# Patient Record
Sex: Female | Born: 1955
Health system: Southern US, Community
[De-identification: ages and names within clinical notes are randomized; demographics above are authoritative.]

## PROBLEM LIST (undated history)

## (undated) ENCOUNTER — Emergency Department (HOSPITAL_COMMUNITY): Admission: EM | Payer: Medicare Other | Source: Home / Self Care

## (undated) DIAGNOSIS — G801 Spastic diplegic cerebral palsy: Secondary | ICD-10-CM

## (undated) DIAGNOSIS — M879 Osteonecrosis, unspecified: Secondary | ICD-10-CM

## (undated) DIAGNOSIS — F329 Major depressive disorder, single episode, unspecified: Secondary | ICD-10-CM

## (undated) DIAGNOSIS — L03116 Cellulitis of left lower limb: Secondary | ICD-10-CM

## (undated) DIAGNOSIS — N289 Disorder of kidney and ureter, unspecified: Secondary | ICD-10-CM

## (undated) DIAGNOSIS — I872 Venous insufficiency (chronic) (peripheral): Secondary | ICD-10-CM

## (undated) DIAGNOSIS — E785 Hyperlipidemia, unspecified: Secondary | ICD-10-CM

## (undated) DIAGNOSIS — F32A Depression, unspecified: Secondary | ICD-10-CM

## (undated) DIAGNOSIS — IMO0002 Reserved for concepts with insufficient information to code with codable children: Secondary | ICD-10-CM

## (undated) DIAGNOSIS — I10 Essential (primary) hypertension: Secondary | ICD-10-CM

## (undated) HISTORY — DX: Venous insufficiency (chronic) (peripheral): I87.2

## (undated) HISTORY — DX: Cellulitis of left lower limb: L03.116

## (undated) HISTORY — DX: Depression, unspecified: F32.A

## (undated) HISTORY — DX: Major depressive disorder, single episode, unspecified: F32.9

## (undated) HISTORY — PX: JOINT REPLACEMENT: SHX530

## (undated) HISTORY — DX: Essential (primary) hypertension: I10

## (undated) HISTORY — DX: Spastic diplegic cerebral palsy: G80.1

## (undated) HISTORY — DX: Osteonecrosis, unspecified: M87.9

## (undated) HISTORY — DX: Reserved for concepts with insufficient information to code with codable children: IMO0002

## (undated) HISTORY — DX: Hyperlipidemia, unspecified: E78.5

---

## 1998-01-22 ENCOUNTER — Encounter: Admission: RE | Admit: 1998-01-22 | Discharge: 1998-01-22 | Payer: Self-pay | Admitting: Hematology and Oncology

## 1998-03-05 ENCOUNTER — Encounter: Admission: RE | Admit: 1998-03-05 | Discharge: 1998-03-05 | Payer: Self-pay | Admitting: Internal Medicine

## 1998-03-22 ENCOUNTER — Encounter: Admission: RE | Admit: 1998-03-22 | Discharge: 1998-03-22 | Payer: Self-pay | Admitting: Internal Medicine

## 1998-04-06 ENCOUNTER — Ambulatory Visit: Admission: RE | Admit: 1998-04-06 | Discharge: 1998-04-06 | Payer: Self-pay | Admitting: *Deleted

## 1998-09-20 ENCOUNTER — Encounter: Admission: RE | Admit: 1998-09-20 | Discharge: 1998-09-20 | Payer: Self-pay | Admitting: Internal Medicine

## 1998-12-20 ENCOUNTER — Encounter: Admission: RE | Admit: 1998-12-20 | Discharge: 1998-12-20 | Payer: Self-pay | Admitting: Internal Medicine

## 1999-04-21 ENCOUNTER — Emergency Department (HOSPITAL_COMMUNITY): Admission: EM | Admit: 1999-04-21 | Discharge: 1999-04-21 | Payer: Self-pay | Admitting: Emergency Medicine

## 1999-06-06 ENCOUNTER — Encounter: Admission: RE | Admit: 1999-06-06 | Discharge: 1999-06-06 | Payer: Self-pay | Admitting: Hematology and Oncology

## 1999-11-27 ENCOUNTER — Encounter: Payer: Self-pay | Admitting: Internal Medicine

## 1999-11-27 ENCOUNTER — Encounter: Admission: RE | Admit: 1999-11-27 | Discharge: 1999-11-27 | Payer: Self-pay | Admitting: Internal Medicine

## 1999-11-27 ENCOUNTER — Ambulatory Visit (HOSPITAL_COMMUNITY): Admission: RE | Admit: 1999-11-27 | Discharge: 1999-11-27 | Payer: Self-pay | Admitting: Internal Medicine

## 2001-03-03 ENCOUNTER — Encounter: Admission: RE | Admit: 2001-03-03 | Discharge: 2001-03-03 | Payer: Self-pay | Admitting: Internal Medicine

## 2002-05-09 ENCOUNTER — Ambulatory Visit (HOSPITAL_COMMUNITY): Admission: RE | Admit: 2002-05-09 | Discharge: 2002-05-09 | Payer: Self-pay | Admitting: Internal Medicine

## 2002-05-09 ENCOUNTER — Encounter: Admission: RE | Admit: 2002-05-09 | Discharge: 2002-05-09 | Payer: Self-pay | Admitting: Internal Medicine

## 2002-05-09 ENCOUNTER — Encounter: Payer: Self-pay | Admitting: Internal Medicine

## 2002-09-12 ENCOUNTER — Encounter: Admission: RE | Admit: 2002-09-12 | Discharge: 2002-09-12 | Payer: Self-pay | Admitting: Internal Medicine

## 2002-10-31 ENCOUNTER — Encounter: Admission: RE | Admit: 2002-10-31 | Discharge: 2002-10-31 | Payer: Self-pay | Admitting: Internal Medicine

## 2003-02-28 ENCOUNTER — Encounter: Admission: RE | Admit: 2003-02-28 | Discharge: 2003-02-28 | Payer: Self-pay | Admitting: Internal Medicine

## 2003-03-17 ENCOUNTER — Encounter: Admission: RE | Admit: 2003-03-17 | Discharge: 2003-03-17 | Payer: Self-pay | Admitting: Internal Medicine

## 2003-07-06 ENCOUNTER — Encounter: Payer: Self-pay | Admitting: Orthopedic Surgery

## 2003-07-10 ENCOUNTER — Inpatient Hospital Stay (HOSPITAL_COMMUNITY): Admission: RE | Admit: 2003-07-10 | Discharge: 2003-07-14 | Payer: Self-pay | Admitting: Orthopedic Surgery

## 2003-07-10 ENCOUNTER — Encounter: Payer: Self-pay | Admitting: Orthopedic Surgery

## 2004-01-31 ENCOUNTER — Encounter: Admission: RE | Admit: 2004-01-31 | Discharge: 2004-01-31 | Payer: Self-pay | Admitting: Internal Medicine

## 2004-11-05 ENCOUNTER — Ambulatory Visit: Payer: Self-pay | Admitting: Internal Medicine

## 2005-12-01 ENCOUNTER — Ambulatory Visit: Payer: Self-pay | Admitting: Internal Medicine

## 2006-04-08 ENCOUNTER — Ambulatory Visit: Payer: Self-pay | Admitting: Internal Medicine

## 2006-04-29 ENCOUNTER — Ambulatory Visit: Payer: Self-pay | Admitting: Internal Medicine

## 2006-04-29 ENCOUNTER — Emergency Department (HOSPITAL_COMMUNITY): Admission: EM | Admit: 2006-04-29 | Discharge: 2006-04-29 | Payer: Self-pay | Admitting: Emergency Medicine

## 2006-09-18 ENCOUNTER — Ambulatory Visit: Payer: Self-pay | Admitting: Internal Medicine

## 2006-10-02 ENCOUNTER — Ambulatory Visit: Payer: Self-pay | Admitting: Internal Medicine

## 2006-10-09 DIAGNOSIS — I872 Venous insufficiency (chronic) (peripheral): Secondary | ICD-10-CM

## 2006-10-09 DIAGNOSIS — D509 Iron deficiency anemia, unspecified: Secondary | ICD-10-CM

## 2006-10-09 DIAGNOSIS — G809 Cerebral palsy, unspecified: Secondary | ICD-10-CM | POA: Insufficient documentation

## 2006-10-09 DIAGNOSIS — M8660 Other chronic osteomyelitis, unspecified site: Secondary | ICD-10-CM | POA: Insufficient documentation

## 2006-10-09 DIAGNOSIS — Z96649 Presence of unspecified artificial hip joint: Secondary | ICD-10-CM

## 2006-10-09 DIAGNOSIS — N912 Amenorrhea, unspecified: Secondary | ICD-10-CM | POA: Insufficient documentation

## 2006-10-09 DIAGNOSIS — E785 Hyperlipidemia, unspecified: Secondary | ICD-10-CM | POA: Insufficient documentation

## 2006-12-06 ENCOUNTER — Emergency Department (HOSPITAL_COMMUNITY): Admission: EM | Admit: 2006-12-06 | Discharge: 2006-12-06 | Payer: Self-pay | Admitting: Emergency Medicine

## 2007-01-24 ENCOUNTER — Ambulatory Visit: Payer: Self-pay | Admitting: Internal Medicine

## 2007-01-24 ENCOUNTER — Inpatient Hospital Stay (HOSPITAL_COMMUNITY): Admission: EM | Admit: 2007-01-24 | Discharge: 2007-01-30 | Payer: Self-pay | Admitting: Emergency Medicine

## 2007-01-28 ENCOUNTER — Ambulatory Visit: Payer: Self-pay | Admitting: Physical Medicine & Rehabilitation

## 2007-01-30 ENCOUNTER — Inpatient Hospital Stay (HOSPITAL_COMMUNITY)
Admission: RE | Admit: 2007-01-30 | Discharge: 2007-02-10 | Payer: Self-pay | Admitting: Physical Medicine & Rehabilitation

## 2007-01-30 ENCOUNTER — Encounter (INDEPENDENT_AMBULATORY_CARE_PROVIDER_SITE_OTHER): Payer: Self-pay | Admitting: *Deleted

## 2007-01-30 ENCOUNTER — Ambulatory Visit: Payer: Self-pay | Admitting: Physical Medicine & Rehabilitation

## 2007-02-15 ENCOUNTER — Ambulatory Visit: Payer: Self-pay | Admitting: Internal Medicine

## 2007-02-15 ENCOUNTER — Encounter (INDEPENDENT_AMBULATORY_CARE_PROVIDER_SITE_OTHER): Payer: Self-pay | Admitting: *Deleted

## 2007-02-15 DIAGNOSIS — I10 Essential (primary) hypertension: Secondary | ICD-10-CM

## 2007-02-15 DIAGNOSIS — M810 Age-related osteoporosis without current pathological fracture: Secondary | ICD-10-CM | POA: Insufficient documentation

## 2007-02-15 HISTORY — DX: Age-related osteoporosis without current pathological fracture: M81.0

## 2007-02-15 LAB — CONVERTED CEMR LAB
Calcium: 9.8 mg/dL (ref 8.4–10.5)
Chloride: 104 meq/L (ref 96–112)
Glucose, Bld: 94 mg/dL (ref 70–99)

## 2007-02-24 ENCOUNTER — Ambulatory Visit (HOSPITAL_COMMUNITY): Admission: RE | Admit: 2007-02-24 | Discharge: 2007-02-24 | Payer: Self-pay | Admitting: *Deleted

## 2007-02-24 ENCOUNTER — Encounter: Payer: Self-pay | Admitting: Vascular Surgery

## 2007-02-24 ENCOUNTER — Ambulatory Visit: Payer: Self-pay | Admitting: Vascular Surgery

## 2007-03-01 ENCOUNTER — Encounter (INDEPENDENT_AMBULATORY_CARE_PROVIDER_SITE_OTHER): Payer: Self-pay | Admitting: Internal Medicine

## 2007-03-01 ENCOUNTER — Ambulatory Visit: Payer: Self-pay | Admitting: Internal Medicine

## 2007-03-01 ENCOUNTER — Encounter (INDEPENDENT_AMBULATORY_CARE_PROVIDER_SITE_OTHER): Payer: Self-pay | Admitting: *Deleted

## 2007-03-01 LAB — CONVERTED CEMR LAB: Potassium: 4.2 meq/L (ref 3.5–5.3)

## 2007-04-21 ENCOUNTER — Encounter (INDEPENDENT_AMBULATORY_CARE_PROVIDER_SITE_OTHER): Payer: Self-pay | Admitting: *Deleted

## 2007-04-21 ENCOUNTER — Ambulatory Visit: Payer: Self-pay | Admitting: *Deleted

## 2007-04-21 LAB — CONVERTED CEMR LAB
ALT: 14 units/L (ref 0–35)
AST: 15 units/L (ref 0–37)
Albumin: 4.9 g/dL (ref 3.5–5.2)
Alkaline Phosphatase: 67 units/L (ref 39–117)
BUN: 28 mg/dL — ABNORMAL HIGH (ref 6–23)
CO2: 25 meq/L (ref 19–32)
Calcium: 9.8 mg/dL (ref 8.4–10.5)
Glucose, Bld: 83 mg/dL (ref 70–99)
Potassium: 3.6 meq/L (ref 3.5–5.3)
Sodium: 140 meq/L (ref 135–145)
Total Protein: 7.5 g/dL (ref 6.0–8.3)

## 2007-05-04 ENCOUNTER — Encounter (INDEPENDENT_AMBULATORY_CARE_PROVIDER_SITE_OTHER): Payer: Self-pay | Admitting: *Deleted

## 2007-05-04 ENCOUNTER — Ambulatory Visit: Payer: Self-pay | Admitting: *Deleted

## 2007-05-05 LAB — CONVERTED CEMR LAB
LDL Cholesterol: 104 mg/dL — ABNORMAL HIGH (ref 0–99)
VLDL: 13 mg/dL (ref 0–40)

## 2007-08-17 ENCOUNTER — Ambulatory Visit: Payer: Self-pay | Admitting: Internal Medicine

## 2007-08-17 ENCOUNTER — Encounter (INDEPENDENT_AMBULATORY_CARE_PROVIDER_SITE_OTHER): Payer: Self-pay | Admitting: *Deleted

## 2007-08-17 LAB — CONVERTED CEMR LAB
Alkaline Phosphatase: 77 units/L (ref 39–117)
BUN: 23 mg/dL (ref 6–23)
CO2: 22 meq/L (ref 19–32)
Calcium: 9.6 mg/dL (ref 8.4–10.5)
Chloride: 104 meq/L (ref 96–112)
Total Bilirubin: 0.5 mg/dL (ref 0.3–1.2)
Total Protein: 7.4 g/dL (ref 6.0–8.3)

## 2008-02-25 ENCOUNTER — Telehealth: Payer: Self-pay | Admitting: *Deleted

## 2008-03-21 ENCOUNTER — Ambulatory Visit: Payer: Self-pay | Admitting: *Deleted

## 2008-03-21 ENCOUNTER — Encounter (INDEPENDENT_AMBULATORY_CARE_PROVIDER_SITE_OTHER): Payer: Self-pay | Admitting: *Deleted

## 2008-03-21 LAB — CONVERTED CEMR LAB
ALT: 12 units/L (ref 0–35)
BUN: 40 mg/dL — ABNORMAL HIGH (ref 6–23)
Calcium: 9.7 mg/dL (ref 8.4–10.5)
Creatinine, Ser: 1.18 mg/dL (ref 0.40–1.20)
Total Protein: 7.7 g/dL (ref 6.0–8.3)

## 2008-05-10 ENCOUNTER — Telehealth: Payer: Self-pay | Admitting: Internal Medicine

## 2008-07-18 ENCOUNTER — Telehealth: Payer: Self-pay | Admitting: Internal Medicine

## 2008-09-07 ENCOUNTER — Telehealth: Payer: Self-pay | Admitting: Internal Medicine

## 2008-11-03 ENCOUNTER — Ambulatory Visit: Payer: Self-pay | Admitting: Internal Medicine

## 2008-11-03 ENCOUNTER — Encounter: Payer: Self-pay | Admitting: Internal Medicine

## 2008-11-03 LAB — CONVERTED CEMR LAB: Cholesterol: 173 mg/dL (ref 0–200)

## 2008-11-08 ENCOUNTER — Telehealth (INDEPENDENT_AMBULATORY_CARE_PROVIDER_SITE_OTHER): Payer: Self-pay | Admitting: *Deleted

## 2008-11-10 ENCOUNTER — Telehealth: Payer: Self-pay | Admitting: Internal Medicine

## 2009-01-03 ENCOUNTER — Telehealth: Payer: Self-pay | Admitting: Internal Medicine

## 2009-01-04 ENCOUNTER — Telehealth: Payer: Self-pay | Admitting: Internal Medicine

## 2009-01-18 ENCOUNTER — Ambulatory Visit: Payer: Self-pay | Admitting: Internal Medicine

## 2009-01-18 ENCOUNTER — Encounter: Payer: Self-pay | Admitting: Internal Medicine

## 2009-01-18 LAB — CONVERTED CEMR LAB
Alkaline Phosphatase: 59 units/L (ref 39–117)
BUN: 37 mg/dL — ABNORMAL HIGH (ref 6–23)
CO2: 22 meq/L (ref 19–32)
Chloride: 105 meq/L (ref 96–112)
Creatinine, Ser: 0.99 mg/dL (ref 0.40–1.20)
GFR calc Af Amer: >60 mL/min (ref >60)
GFR calc non Af Amer: 58 mL/min — ABNORMAL LOW (ref >60)
MCHC: 32.1 g/dL (ref 30.0–36.0)
Platelets: 241 10*3/uL (ref 150–400)
RBC: 4.41 M/uL (ref 3.87–5.11)
RDW: 13.9 % (ref 11.5–15.5)
Sodium: 142 meq/L (ref 135–145)
Total Bilirubin: 0.4 mg/dL (ref 0.3–1.2)
WBC: 5 10*3/uL (ref 4.0–10.5)

## 2009-01-24 ENCOUNTER — Ambulatory Visit (HOSPITAL_COMMUNITY): Admission: RE | Admit: 2009-01-24 | Discharge: 2009-01-24 | Payer: Self-pay | Admitting: Internal Medicine

## 2009-01-29 ENCOUNTER — Encounter: Payer: Self-pay | Admitting: Internal Medicine

## 2009-01-31 ENCOUNTER — Encounter: Admission: RE | Admit: 2009-01-31 | Discharge: 2009-01-31 | Payer: Self-pay | Admitting: Internal Medicine

## 2009-01-31 ENCOUNTER — Encounter: Payer: Self-pay | Admitting: Internal Medicine

## 2009-04-05 ENCOUNTER — Telehealth: Payer: Self-pay | Admitting: *Deleted

## 2009-04-05 ENCOUNTER — Emergency Department (HOSPITAL_COMMUNITY): Admission: EM | Admit: 2009-04-05 | Discharge: 2009-04-05 | Payer: Self-pay | Admitting: Emergency Medicine

## 2009-04-24 ENCOUNTER — Ambulatory Visit: Payer: Self-pay | Admitting: Internal Medicine

## 2009-04-24 ENCOUNTER — Encounter: Payer: Self-pay | Admitting: Internal Medicine

## 2009-04-25 LAB — CONVERTED CEMR LAB
BUN: 34 mg/dL — ABNORMAL HIGH (ref 6–23)
Chloride: 106 meq/L (ref 96–112)
Cholesterol: 194 mg/dL (ref 0–200)
HDL: 59 mg/dL (ref >39)
Potassium: 4.2 meq/L (ref 3.5–5.3)
Sodium: 140 meq/L (ref 135–145)

## 2009-04-25 LAB — FECAL OCCULT BLOOD, GUAIAC: Fecal Occult Blood: NEGATIVE

## 2009-05-01 ENCOUNTER — Ambulatory Visit: Payer: Self-pay | Admitting: Internal Medicine

## 2009-05-01 LAB — CONVERTED CEMR LAB: OCCULT 2: NEGATIVE

## 2009-05-11 ENCOUNTER — Telehealth: Payer: Self-pay | Admitting: Internal Medicine

## 2009-06-11 ENCOUNTER — Telehealth: Payer: Self-pay | Admitting: Internal Medicine

## 2009-07-12 ENCOUNTER — Telehealth: Payer: Self-pay | Admitting: Internal Medicine

## 2009-08-02 ENCOUNTER — Telehealth: Payer: Self-pay | Admitting: Internal Medicine

## 2009-08-03 ENCOUNTER — Encounter: Payer: Self-pay | Admitting: Internal Medicine

## 2009-08-07 ENCOUNTER — Telehealth: Payer: Self-pay | Admitting: Internal Medicine

## 2009-08-15 ENCOUNTER — Telehealth: Payer: Self-pay | Admitting: Internal Medicine

## 2009-08-16 ENCOUNTER — Telehealth: Payer: Self-pay | Admitting: Internal Medicine

## 2009-08-24 ENCOUNTER — Encounter: Admission: RE | Admit: 2009-08-24 | Discharge: 2009-08-24 | Payer: Self-pay | Admitting: Internal Medicine

## 2009-10-31 ENCOUNTER — Telehealth: Payer: Self-pay | Admitting: Internal Medicine

## 2009-11-01 ENCOUNTER — Telehealth: Payer: Self-pay | Admitting: Internal Medicine

## 2009-11-09 ENCOUNTER — Telehealth: Payer: Self-pay | Admitting: Internal Medicine

## 2009-12-24 ENCOUNTER — Ambulatory Visit: Payer: Self-pay | Admitting: Infectious Diseases

## 2009-12-24 LAB — CONVERTED CEMR LAB
AST: 18 units/L (ref 0–37)
Albumin: 4.8 g/dL (ref 3.5–5.2)
BUN: 27 mg/dL — ABNORMAL HIGH (ref 6–23)
CO2: 23 meq/L (ref 19–32)
Creatinine, Ser: 0.94 mg/dL (ref 0.40–1.20)
LDL Cholesterol: 99 mg/dL (ref 0–99)
Potassium: 3.9 meq/L (ref 3.5–5.3)
Total CHOL/HDL Ratio: 2.7
Triglycerides: 86 mg/dL (ref <150)

## 2009-12-28 ENCOUNTER — Telehealth: Payer: Self-pay | Admitting: *Deleted

## 2009-12-31 ENCOUNTER — Telehealth: Payer: Self-pay | Admitting: *Deleted

## 2009-12-31 ENCOUNTER — Telehealth: Payer: Self-pay | Admitting: Internal Medicine

## 2010-01-08 ENCOUNTER — Encounter: Payer: Self-pay | Admitting: Internal Medicine

## 2010-01-25 ENCOUNTER — Encounter: Admission: RE | Admit: 2010-01-25 | Discharge: 2010-01-25 | Payer: Self-pay | Admitting: Internal Medicine

## 2010-01-25 LAB — HM MAMMOGRAPHY

## 2010-01-28 ENCOUNTER — Encounter: Payer: Self-pay | Admitting: Internal Medicine

## 2010-02-07 ENCOUNTER — Telehealth: Payer: Self-pay | Admitting: *Deleted

## 2010-05-02 ENCOUNTER — Telehealth: Payer: Self-pay | Admitting: Internal Medicine

## 2010-09-30 ENCOUNTER — Telehealth: Payer: Self-pay | Admitting: Internal Medicine

## 2010-10-02 ENCOUNTER — Encounter: Payer: Self-pay | Admitting: Internal Medicine

## 2010-10-02 ENCOUNTER — Ambulatory Visit: Payer: Self-pay | Admitting: Internal Medicine

## 2010-11-11 ENCOUNTER — Encounter: Payer: Self-pay | Admitting: *Deleted
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
## 2010-11-19 NOTE — Progress Notes (Signed)
  Phone Note Call from Patient   Caller: Patient Call For: Kathy Barefoot MD Reason for Call: Talk to Nurse Summary of Call: Patient called and stated that she did call the PT Office  this morning.  She said that she has decided not to get the PT.  She states that she does not have a car, has no transportation, and has no one at her disposal to help get her to and from her appointments.  She also said she did not need to get any shoes.  She said it was not her suggestion to even get the diabetic shoes.  She said that she  knows her shoes are too big because she likes to buy her shoes bigger.  She wanted to thank you and Dr. Sunnie Nielsen for trying to help assist her but, she just doesn't want PT.  I told her that the message would be relayed. Initial call taken by: Shon Hough,  December 31, 2009 11:56 AM    ok thanks.

## 2010-11-19 NOTE — Progress Notes (Signed)
Summary: refill/ hla  Phone Note Refill Request Message from:  Fax from Pharmacy on November 01, 2009 12:34 PM  Refills Requested: Medication #1:  FOSAMAX 35 MG TABS take 1 tablet once a week with full glass of water   Last Refilled: 12/13 Initial call taken by: Marin Roberts RN,  November 01, 2009 12:34 PM    Prescriptions: FOSAMAX 35 MG TABS (ALENDRONATE SODIUM) take 1 tablet once a week with full glass of water, stay upright for at least 30 minutes.  #4 x 4   Entered and Authorized by:   Hartley Barefoot MD   Signed by:   Hartley Barefoot MD on 11/02/2009   Method used:   [facility]        [facility] [address] [phone]* (retail)       [address]       [place], [place]  [address]       Ph: [phone] or [phone]       Fax: [phone]   RxID:   [id number]

## 2010-11-19 NOTE — Progress Notes (Signed)
  Phone Note Outgoing Call   Call placed by: Theotis Barrio NT II,  December 31, 2009 10:21 AM Call placed to: Patient Details for Reason: PT REFERRAL Summary of Call: CALLED AND LEFT VOICE MESSAGE FOR PATIENT TO CALL THE PT OFFICE TO GET APPT SET UP. CALLED PATIENT 3-11-011 AND SPOKE WITH Sherion AND GAVE THE INFO TO HER TO CALL THE OFFICE ( [phone]). I SPOKE WITH THE PT OFFICE THIS MORNING, Damariz STILL HAS NOT CALLED THE OFFICE TO GET THIS APPT SET UP.  [name] Sturdivant NT II  December 31, 2009 10:24 AM

## 2010-11-19 NOTE — Progress Notes (Signed)
  Phone Note Outgoing Call   Call placed by: Theotis Barrio NT II,  December 28, 2009 11:55 AM Call placed to: Patient Details for Reason: PT REFERRAL Summary of Call: SPOKE WITH PATIENT FOR HER TO CALL THE PAIN CLINIC TO SET UP APPT/ THEY HAD LEFT MESSAGE FOR HER, SHE HADN'T CALLED THEM. SO I GAVE HER THE NUMBER TO CALL THEIR OFFICE AT [phone].  Kathy Owens  NTII

## 2010-11-19 NOTE — Miscellaneous (Signed)
Summary: [facility]   Imported By: Margie Billet 01/29/2010 14:55:50  _____________________________________________________________________  External Attachment:    Type:   Image     Comment:   External Document

## 2010-11-19 NOTE — Progress Notes (Signed)
Summary: med refill/gp  Phone Note Refill Request Message from:  Fax from Pharmacy on May 02, 2010 10:11 AM  Refills Requested: Medication #1:  VICODIN 5-500 MG  TABS Take 1 every 8 hour for pain as needed.   Dosage confirmed as above?Dosage Confirmed   Brand Name Necessary? No   Supply Requested: 3 months   Last Refilled: 04/05/2010 Last appt. Mar.7.   Method Requested: Telephone to Pharmacy Initial call taken by: Chinita Pester RN,  May 02, 2010 10:11 AM  Follow-up for Phone Call        Refill approved-nurse to complete Follow-up by: Lars Mage MD,  May 02, 2010 11:55 AM  Additional Follow-up for Phone Call Additional follow up Details #1::        [facility] [address] [address] Additional Follow-up by: Chinita Pester RN,  May 02, 2010 4:28 PM    Prescriptions: VICODIN 5-500 MG  TABS (HYDROCODONE-ACETAMINOPHEN) Take 1 every 8 hour for pain as needed.  #90 x 2   Entered and Authorized by:   Lars Mage MD   Signed by:   Lars Mage MD on 05/02/2010   Method used:   Telephoned to ...       [facility] [address] [phone]* (retail)       [address]       [place], [place]  [address]       Ph: [phone] or [phone]       Fax: [phone]   RxID:   [id number]

## 2010-11-19 NOTE — Progress Notes (Signed)
Summary: info/ hla  Phone Note Call from Patient   Summary of Call: pt calls and asks about shingles and contact w/ a person w/ shingles, information given briefly and referred to [facility] if needed  Initial call taken by: Marin Roberts RN,  February 11, 2010 2:29 PM

## 2010-11-19 NOTE — Progress Notes (Signed)
Summary: refill/ hla  Phone Note Refill Request Message from:  Fax from Pharmacy on October 31, 2009 2:42 PM  Refills Requested: Medication #1:  FOSAMAX 35 MG TABS take 1 tablet once a week with full glass of water   Last Refilled: 12/13 Initial call taken by: Marin Roberts RN,  October 31, 2009 2:42 PM    Prescriptions: FOSAMAX 35 MG TABS (ALENDRONATE SODIUM) take 1 tablet once a week with full glass of water, stay upright for at least 30 minutes.  #4 x 4   Entered and Authorized by:   Hartley Barefoot MD   Signed by:   Hartley Barefoot MD on 11/02/2009   Method used:   [facility]ically to        [facility] [address] [phone]* (retail)       [address]       [place], [place]  [address]       Ph: [phone] or [phone]       Fax: [phone]   RxID:   [id number]

## 2010-11-19 NOTE — Progress Notes (Signed)
Summary: refill/gg  Phone Note Refill Request  on November 09, 2009 10:45 AM  Refills Requested: Medication #1:  VICODIN 5-500 MG  TABS Take 1-2 tablets everyday as needed.   Last Refilled: 10/09/2009  Method Requested: Telephone to Pharmacy Initial call taken by: Merrie Roof RN,  November 09, 2009 10:46 AM    Prescriptions: VICODIN 5-500 MG  TABS (HYDROCODONE-ACETAMINOPHEN) Take 1-2 tablets everyday as needed.  #60 x 3   Entered and Authorized by:   Hartley Barefoot MD   Signed by:   Hartley Barefoot MD on 11/09/2009   Method used:   Telephoned to ...       [name] [address] [phone]* (retail)       [address]       [place], [place]  [address]       Ph: [phone] or [phone]       Fax: [phone]   RxID:   [id number]

## 2010-11-19 NOTE — Miscellaneous (Signed)
Summary: PT  PT   Imported By: Florinda Marker 01/08/2010 14:45:35  _____________________________________________________________________  External Attachment:    Type:   Image     Comment:   External Document

## 2010-11-21 NOTE — Assessment & Plan Note (Signed)
Summary: ACUTE-PT NEEDNING MEDICAL CLEAR FOR HER CHURCH TO GO TO [place]...   Vital Signs:  Patient profile:   55 year old female Height:      60 inches (152.40 cm) Weight:      162.8 pounds (74 kg) BMI:     31.91 Temp:     97.3 degrees F (36.28 degrees C) oral Pulse rate:   85 / minute BP sitting:   137 / 87  (left arm)  Vitals Entered By: Stanton Kidney Ditzler RN (October 02, 2010 9:42 AM) Is Patient Diabetic? No Pain Assessment Patient in pain? yes     Location: joints Intensity: 2 Type: aching Onset of pain  long time Nutritional Status BMI of > 30 = obese Nutritional Status Detail appetite good  Have you ever been in a relationship where you felt threatened, hurt or afraid?denies   Does patient need assistance? Functional Status Self care Ambulation Impaired:Risk for fall Comments Uses a walker. Refills on meds. Needs clearance for trip to [place] with church in 02/2011.   Primary Care Provider:  Lars Mage MD   History of Present Illness: 55 yo woman with PMH of cerebral palsy, HLP, HTN, venous insufficiency, osteopenia, stress fracture presents today for medical clearance to go on trip to [place] with church and medication refill on Hydrocodone.  She has chronic pain in her knees and takes Hydrocodone as needed for pain, she never takes more than 3 tablets per day.  She denies any chest pain or SOB.  She states that she walks with a walker and only stop at times because of her muscle but not because she is out of breath.  She is taking all her medications as prescribed.  No other complaints at this time.       Depression History:      The patient denies a depressed mood most of the day and a diminished interest in her usual daily activities.         Preventive Screening-Counseling & Management  Alcohol-Tobacco     Smoking Status: never  Caffeine-Diet-Exercise     Does Patient Exercise: yes     Type of exercise: WALKING     Exercise (avg: min/session): DAILY  Times/week: AT TOLERATED  Allergies: No Known Drug Allergies  Review of Systems  The patient denies anorexia, fever, weight loss, weight gain, vision loss, decreased hearing, hoarseness, chest pain, syncope, dyspnea on exertion, peripheral edema, prolonged cough, headaches, hemoptysis, abdominal pain, melena, hematochezia, severe indigestion/heartburn, hematuria, incontinence, genital sores, muscle weakness, suspicious skin lesions, transient blindness, difficulty walking, depression, unusual weight change, abnormal bleeding, enlarged lymph nodes, angioedema, breast masses, and testicular masses.    Physical Exam  General:  alert, well-developed, well-nourished, and well-hydrated.   Lungs:  normal respiratory effort, no intercostal retractions, no accessory muscle use, normal breath sounds, no crackles, and no wheezes.   Heart:  normal rate, regular rhythm, no murmur, no gallop, no rub, and no JVD.   Abdomen:  soft, non-tender, normal bowel sounds, no distention, and no masses.   Msk:  lower extremities weakness and deformities secondary to cerebral palsy Extremities:  +1-2 nonpitting edema secondary to venous insufficiency Neurologic:  alert & oriented X3, cranial nerves II-XII intact, and sensation intact to light touch.  motor strength 3-4/5 lower and upper extremities.   Impression & Recommendations:  Problem # 1:  ESSENTIAL HYPERTENSION (ICD-401.9) Well controlled- BP today 137/87.  Will continue current medication Her updated medication list for this problem includes:    Triamterene-hctz  37.5-25 Mg Tabs (Triamterene-hctz) .Marland Kitchen... Take 1 tablet by mouth once a day  Problem # 2:  KNEE PAIN (ICD-719.46) well-controlled with Vicodin, she states that she barely takes more than 3 tablets per day for pain.  Will refill her pain med so that she can have enough to go on the trip to [place] in May 2012.    Her updated medication list for this problem includes:    Vicodin 5-500 Mg Tabs  (Hydrocodone-acetaminophen) .Marland Kitchen... Take 1 every 8 hour for pain as needed.  Problem # 3:  PREVENTIVE HEALTH CARE (ICD-V70.0) She received flu vaccine in 9/2011at [facility] I wrote a clearance letter for her to go on the mission trip with her church with other physicians and nurses, will be going on a charter plane.  Please see letter for more details.  Problem # 4:  OSTEOPENIA (ICD-733.90) Stable. Continue current medication. Her updated medication list for this problem includes:    Fosamax 35 Mg Tabs (Alendronate sodium) .Marland Kitchen... Take 1 tablet once a week with full glass of water, stay upright for at least 30 minutes.  Complete Medication List: 1)  Triamterene-hctz 37.5-25 Mg Tabs (Triamterene-hctz) .... Take 1 tablet by mouth once a day 2)  Lipitor 10 Mg Tabs (Atorvastatin calcium) .... Take 1 tablet by mouth once a day 3)  Vicodin 5-500 Mg Tabs (Hydrocodone-acetaminophen) .... Take 1 every 8 hour for pain as needed. 4)  Oscal 500/200 D-3 500-200 Mg-unit Tabs (Calcium-vitamin d) .... Take 1 tablet twicea day. 5)  Fosamax 35 Mg Tabs (Alendronate sodium) .... Take 1 tablet once a week with full glass of water, stay upright for at least 30 minutes.  Patient Instructions: 1)  Please take all medications as prescribed 2)  Follow up with your primary care physician in 3-4 months  Prescriptions: VICODIN 5-500 MG  TABS (HYDROCODONE-ACETAMINOPHEN) Take 1 every 8 hour for pain as needed.  #90 x 2   Entered and Authorized by:   Rosana Berger MD   Signed by:   Rosana Berger MD on 10/02/2010   Method used:   Print then Give to Patient   RxID:   [id number] VICODIN 5-500 MG  TABS (HYDROCODONE-ACETAMINOPHEN) Take 1 every 8 hour for pain as needed.  #90 x 2   Entered and Authorized by:   Rosana Berger MD   Signed by:   Rosana Berger MD on 10/02/2010   Method used:   Print then Give to Patient   RxID:   [id number]    Orders Added: 1)  Est. Patient Level IV [84696]     Prevention & Chronic  Care Immunizations   Influenza vaccine: refuses  (08/17/2007)   Influenza vaccine deferral: Deferred  (04/24/2009)   Influenza vaccine due: 06/20/2009    Tetanus booster: 04/24/2009: Td   Tetanus booster due: 04/25/2019    Pneumococcal vaccine: Not documented   Pneumococcal vaccine deferral: Deferred  (04/24/2009)  Colorectal Screening   Hemoccult: negative  (05/01/2009)   Hemoccult action/deferral: Ordered  (04/24/2009)   Hemoccult due: 04/2010    Colonoscopy: Not documented   Colonoscopy action/deferral: Refused  (12/24/2009)  Other Screening   Pap smear: Not documented   Pap smear action/deferral: Deferred  (12/24/2009)    Mammogram: BI-RADS CATEGORY 2:  Benign finding(s).^MM DIGITAL DIAGNOSTIC BILAT  (01/25/2010)   Smoking status: never  (10/02/2010)  Lipids   Total Cholesterol: 186  (12/24/2009)   Lipid panel action/deferral: Lipid Panel ordered   LDL: 99  (12/24/2009)   LDL Direct: Not documented  HDL: 70  (12/24/2009)   Triglycerides: 86  (12/24/2009)    SGOT (AST): 18  (12/24/2009)   SGPT (ALT): 20  (12/24/2009)   Alkaline phosphatase: 37  (12/24/2009)   Total bilirubin: 0.6  (12/24/2009)  Hypertension   Last Blood Pressure: 137 / 87  (10/02/2010)   Serum creatinine: 0.94  (12/24/2009)   BMP action: Ordered   Serum potassium 3.9  (12/24/2009)  Self-Management Support :   Personal Goals (by the next clinic visit) :      Personal blood pressure goal: 140/90  (12/24/2009)     Personal LDL goal: 100  (12/24/2009)    Patient will work on the following items until the next clinic visit to reach self-care goals:     Medications and monitoring: take my medicines every day, bring all of my medications to every visit  (10/02/2010)     Eating: eat more vegetables, use fresh or frozen vegetables, eat fruit for snacks and desserts, limit or avoid alcohol  (10/02/2010)     Activity: take a 30 minute walk every day  (10/02/2010)    Hypertension self-management  support: Written self-care plan, Education handout, Resources for patients handout  (10/02/2010)   Hypertension self-care plan printed.   Hypertension education handout printed    Lipid self-management support: Written self-care plan, Education handout, Resources for patients handout  (10/02/2010)   Lipid self-care plan printed.   Lipid education handout printed      Resource handout printed.  Appended Document: ACUTE-PT NEEDNING MEDICAL CLEAR FOR HER CHURCH TO GO TO [place]... Two pair knee high TED mailed to pt - calf 13.5" and length 16".

## 2010-11-21 NOTE — Progress Notes (Signed)
Summary: Approval for trip  Phone Note Call from Patient   Caller: Patient Call For: Lars Mage MD Summary of Call: Call from pt needs to get Medical Clearance to go to [place].  Pt said that the trip is through her Elesa Hacker and will be in May 2012.  Pt was last seen by Dr. Sunnie Nielsen in March 2012.  Pt was informed that she will need to be seen by her physicans.  Needs approval by 10/04/2010. Angelina Ok RN  September 30, 2010 10:59 AM  Initial call taken by: Angelina Ok RN,  September 30, 2010 10:58 AM  Follow-up for Phone Call        Called and spoke with patient and sch her an appointment with Dr. Anselm Jungling on 10/02/2010. Follow-up by: Shon Hough,  September 30, 2010 12:27 PM      1

## 2010-11-21 NOTE — Letter (Signed)
Summary: Generic Letter  [facility]  [address]   [place], [place] [address]   Phone: [phone]  Fax: [phone]    10/02/2010  Kathy Owens [address] [place], [place]  [address]  To Whom It May Concern: I am writing to inform you that [name] may travel overseas on the [place] to [place] based on my physical examination.  She does not have any overt lung or heart problems as of 10/02/2010.  Patient does have a past medical history of hyperlipidemia, hypertension, venous insufficiency, cerebral palsy, osteopenia which are all stable and well-controlled with medications.  Attached is a list of her current medications.    Sincerely,     Rosana Berger MD

## 2010-12-23 ENCOUNTER — Other Ambulatory Visit: Payer: Self-pay | Admitting: Internal Medicine

## 2010-12-23 DIAGNOSIS — Z1231 Encounter for screening mammogram for malignant neoplasm of breast: Secondary | ICD-10-CM

## 2011-01-05 ENCOUNTER — Encounter: Payer: Self-pay | Admitting: Internal Medicine

## 2011-01-14 ENCOUNTER — Other Ambulatory Visit: Payer: Self-pay | Admitting: Internal Medicine

## 2011-01-17 ENCOUNTER — Other Ambulatory Visit: Payer: Self-pay | Admitting: Internal Medicine

## 2011-01-17 NOTE — Telephone Encounter (Signed)
Hydrocodone/ Acet rx called to [facility] pharmacy.

## 2011-01-20 ENCOUNTER — Ambulatory Visit (INDEPENDENT_AMBULATORY_CARE_PROVIDER_SITE_OTHER): Payer: Medicare Other | Admitting: Internal Medicine

## 2011-01-20 ENCOUNTER — Encounter: Payer: Self-pay | Admitting: Internal Medicine

## 2011-01-20 DIAGNOSIS — I872 Venous insufficiency (chronic) (peripheral): Secondary | ICD-10-CM

## 2011-01-20 DIAGNOSIS — M25569 Pain in unspecified knee: Secondary | ICD-10-CM

## 2011-01-20 DIAGNOSIS — E785 Hyperlipidemia, unspecified: Secondary | ICD-10-CM

## 2011-01-20 DIAGNOSIS — I10 Essential (primary) hypertension: Secondary | ICD-10-CM

## 2011-01-20 DIAGNOSIS — B351 Tinea unguium: Secondary | ICD-10-CM

## 2011-01-20 DIAGNOSIS — D509 Iron deficiency anemia, unspecified: Secondary | ICD-10-CM

## 2011-01-20 LAB — CBC WITH DIFFERENTIAL/PLATELET
Basophils Absolute: 0 10*3/uL (ref 0.0–0.1)
Eosinophils Absolute: 0.2 10*3/uL (ref 0.0–0.7)
Eosinophils Relative: 4 % (ref 0–5)
HCT: 40.5 % (ref 36.0–46.0)
Hemoglobin: 13.2 g/dL (ref 12.0–15.0)
Lymphocytes Relative: 34 % (ref 12–46)
Lymphs Abs: 1.9 10*3/uL (ref 0.7–4.0)
MCH: 29.1 pg (ref 26.0–34.0)
MCV: 89.2 fL (ref 78.0–100.0)
Monocytes Relative: 7 % (ref 3–12)
Neutrophils Relative %: 55 % (ref 43–77)
RBC: 4.54 MIL/uL (ref 3.87–5.11)
WBC: 5.6 10*3/uL (ref 4.0–10.5)

## 2011-01-20 LAB — LIPID PANEL
HDL: 55 mg/dL (ref >39)
LDL Cholesterol: 100 mg/dL — ABNORMAL HIGH (ref 0–99)
Total CHOL/HDL Ratio: 3.2 Ratio
VLDL: 19 mg/dL (ref 0–40)

## 2011-01-20 MED ORDER — HYDROCODONE-ACETAMINOPHEN 5-500 MG PO TABS: 90.0000 | ORAL_TABLET | Freq: Three times a day (TID) | ORAL | Status: DC | PRN

## 2011-01-20 NOTE — Progress Notes (Signed)
  Subjective:    Patient ID: Kathy Owens, female    DOB: 21-Aug-1956, 55 y.o.   MRN: [id number]  HPI [name] is a 55 year old female who has cerebral palsy. Patient is here for a regular followup. Patient hasn't had her blood drawn in last one year.  Patient's blood pressure is marginally elevated at 144/87 today. Patient has lost about 7-8 pounds since her last visit.  Hyperlipidemia patient is taking her Lipitor at regular time. I would repeat her lipid panel today.  Patient has bilateral knee pain and takes Vicodin 1 tablet 3 times a day on an as-needed basis B. Wood to pain contract today as patient hasn't had one.  Patient is iron deficiency anemia we will check patient's CBC today.  Patient denies colonoscopy and Pap smear at this time.  No other problems at this time.      Review of Systems  Constitutional: Negative for fever, activity change and appetite change.  HENT: Negative for sore throat.   Respiratory: Negative for cough and shortness of breath.   Cardiovascular: Negative for chest pain and leg swelling.  Gastrointestinal: Negative for nausea, abdominal pain, diarrhea, constipation and abdominal distention.  Genitourinary: Negative for frequency, hematuria and difficulty urinating.  Neurological: Negative for dizziness and headaches.  Psychiatric/Behavioral: Negative for suicidal ideas and behavioral problems.       Objective:   Physical Exam  Constitutional: She is oriented to person, place, and time. She appears well-nourished.       Patient has genu valgum at both knee joints and has an affect of a child.  HENT:  Head: Normocephalic and atraumatic.  Eyes: Conjunctivae and EOM are normal. Pupils are equal, round, and reactive to light. No scleral icterus.  Neck: Normal range of motion. Neck supple. No JVD present. No thyromegaly present.  Cardiovascular: Normal rate, regular rhythm, normal heart sounds and intact distal pulses.  Exam reveals no gallop  and no friction rub.   No murmur heard. Pulmonary/Chest: Effort normal and breath sounds normal. No respiratory distress. She has no wheezes. She has no rales.  Abdominal: Soft. Bowel sounds are normal. She exhibits no distension and no mass. There is no tenderness. There is no rebound and no guarding.  Musculoskeletal: She exhibits no edema and no tenderness.       Range of motion is limited. And she used walker to get around.  Lymphadenopathy:    She has no cervical adenopathy.  Neurological: She is alert and oriented to person, place, and time.  Psychiatric: She has a normal mood and affect. Her behavior is normal.          Assessment & Plan:

## 2011-01-20 NOTE — Patient Instructions (Signed)
Calorie Counting Diet A calorie counting diet requires you to eat the number of calories that are right for you during a day. Calories are the measurement of how much energy you get from the food you eat. Eating the right amount of calories is important for staying at a healthy weight. If you eat too many calories your body will store them as fat and you may gain weight. If you eat too few calories you may lose weight. Counting the number of calories that you eat during a day will help you to know if you're eating the right amount. A Registered Dietitian can determine how many calories you need in a day. The amount of calories you need varies from person to person. If your goal is to lose weight you will need to eat fewer calories. Losing weight can benefit you if you are overweight or have health problems such as heart disease, high blood pressure or diabetes. If your goal is to gain weight, you will need to eat more calories. Gaining weight may be necessary if you have a certain health problem that causes your body to need more energy. TIPS Whether you are increasing or decreasing the number of calories you eat during a day, it may be hard to get used to changing what you eat and drink. The following are tips to help you keep track of the number of calories you are eating.  Measuring foods at home with measuring cups will help you to know the actual amount of food and number of calories you are eating.   Restaurants serve food in all different portion sizes. It is common that restaurants will serve food in amounts worth 2 or more serving sizes. While eating out, it may be helpful to estimate how many servings of a food you are given. For example, a serving of cooked rice is 1/2 cup and that is the size of half of a fist. Knowing serving sizes will help you have a better idea of how much food you are eating at restaurants.   Ask for smaller portion sizes or child-size portions at restaurants.   Plan to  eat half of a meal at a restaurant and take the rest home or share the other half with a friend   Read food labels for calorie content and serving size   Most packaged food has a Nutrition Facts Panel on its side or back. Here you can find out how many servings are in a package, the size of a serving, and the number of calories each serving has.   The serving size and number of servings per container are listed right below the Nutrition Facts heading. Just below the serving information, the number of calories in each serving is listed.   For example, say that a package has three cookies inside. The Nutrition Facts panel says that one serving is one cookie. Below that, it says that there are three servings in the container. The calories section of the Nutrition Facts says there are 90 calories. That means that there are 90 calories in one cookie. If you eat one cookie you have eaten 90 calories. If you eat all three cookies, you have eaten three times that amount, or 270 calories.  The list below tells you how big or small some common portion sizes are.  1 ounce (oz).................4 stacked dice.   3 oz..............................Deck of cards.   1 teaspoon (tsp)...........Tip of little finger.   1 tablespoon (Tbsp)....Tip of thumb.     2 Tbsp..........................Golf ball.    Cup..........................Half of a fist.   1 Cup...........................A fist.  KEEP A FOOD LOG Write down every food item that you eat, how much of the food you eat, and the number of calories in each food that you eat during the day. At the end of the day or throughout the day you can add up the total number of calories you have eaten.  It may help to set up a list like the one below. Find out the calorie information by reading food labels.  Breakfast   Bran Flakes (1 cup, 110 calories).   Fat free milk ( cup, 45 calories).   Snack   Apple (1 medium, 80 calories).   Lunch   Spinach (1  cup, 20 calories).   Tomato ( medium, 20 calories).   Chicken breast strips (3 oz, 165 calories).   Shredded cheddar cheese ( cup, 110 calories).   Light Italian dressing (2 Tbsp, 60 calories).   Whole wheat bread (1 slice, 80 calories).   Tub margarine (1 tsp, 35 calories).   Vegetable soup (1 cup, 160 calories).   Dinner   Pork chop (3 oz, 190 calories).   Brown rice (1 cup, 215 calories).   Steamed broccoli ( cup, 20 calories).   Strawberries (1  cup, 65 calories).   Whipped cream (1 Tbsp, 50 calories).  Daily Calorie Total: 1425 Information from www.eatright.org, Foodwise Nutritional Analysis Database. Document Released: 10/06/2005 Document Re-Released: 10/28/2009 Exit[employer] Patient Information 2011 [employer]. 

## 2011-01-20 NOTE — Assessment & Plan Note (Signed)
Patient's blood pressure is 144/87 today. Having cerebral palsy, I do not think that I would follow the regular measures of controlling her blood pressure below 140/90.  As long as pressures reasonable I would be okay with it.

## 2011-01-20 NOTE — Assessment & Plan Note (Signed)
Patient's bilateral feet were checked for any new or back onychomycosis. Patient patient does have nails which require a cut it patient says that her sister takes care of all her foot and she would ask her to cut her nails.

## 2011-01-20 NOTE — Assessment & Plan Note (Signed)
I would continue patient on Vicodin. Would get a pain contract today.

## 2011-01-20 NOTE — Assessment & Plan Note (Signed)
I would check patient's CBC today.

## 2011-01-20 NOTE — Assessment & Plan Note (Signed)
Check lipid profile today. 

## 2011-01-20 NOTE — Assessment & Plan Note (Signed)
I would give her a TED hose from the clinic today as her older one seems to be wearing off.

## 2011-01-20 NOTE — Progress Notes (Signed)
Addended by: Alric Quan on: 01/20/2011 10:53 AM   Modules accepted: Orders

## 2011-01-21 LAB — BASIC METABOLIC PANEL WITH GFR
BUN: 27 mg/dL — ABNORMAL HIGH (ref 6–23)
Calcium: 9.6 mg/dL (ref 8.4–10.5)
Chloride: 101 mEq/L (ref 96–112)
Creat: 1.22 mg/dL — ABNORMAL HIGH (ref 0.40–1.20)
Glucose, Bld: 94 mg/dL (ref 70–99)
Potassium: 3.7 mEq/L (ref 3.5–5.3)

## 2011-01-21 NOTE — Telephone Encounter (Signed)
Rx called in 

## 2011-01-27 LAB — CULTURE, ROUTINE-ABSCESS

## 2011-01-28 ENCOUNTER — Telehealth: Payer: Self-pay | Admitting: *Deleted

## 2011-01-28 DIAGNOSIS — B351 Tinea unguium: Secondary | ICD-10-CM

## 2011-01-28 NOTE — Telephone Encounter (Signed)
Pt calls and requests lab results and if there are labs to be concerned about? Also she said she has not gotten her ted hose yet, they were to be mailed

## 2011-01-29 ENCOUNTER — Ambulatory Visit
Admission: RE | Admit: 2011-01-29 | Discharge: 2011-01-29 | Disposition: A | Discharge status: Home / Self Care | Payer: Medicare Other | Source: Ambulatory Visit | Attending: *Deleted | Admitting: *Deleted

## 2011-01-29 DIAGNOSIS — Z1231 Encounter for screening mammogram for malignant neoplasm of breast: Secondary | ICD-10-CM

## 2011-02-03 NOTE — Telephone Encounter (Signed)
Pt calls AGAIN, please call her today, she wants you to tell her about her labs, also she has not gotten her TED hose yet and she needs a referral to [facility] to have her toenails cut.   Thank you, h.

## 2011-02-03 NOTE — Telephone Encounter (Signed)
Patient called and informed about the results of her tests. She expressed desire for getting [facility] appointment for foot care and also wanted an extra pair of ted hose to be mailed to her.

## 2011-02-06 NOTE — Telephone Encounter (Signed)
Addended by: Kingsley Spittle on: 02/06/2011 04:56 PM   Modules accepted: Orders

## 2011-02-06 NOTE — Telephone Encounter (Addendum)
Message left on pt's recorder informing her that her TED Hose and podiatry appointment will be mailed out tomorrow.  Pt also instructed to contact the clinic and ask for me directly if she has any questions and to contact [facility] directly if she is unable to keep appt scheduled for Mon April 30th at 10:45am with Dr Charlsie Merles. Verbal authorization given from Dr Eben Burow to make podiatry appt, order entered into chart.

## 2011-03-06 ENCOUNTER — Ambulatory Visit (HOSPITAL_COMMUNITY)
Admission: RE | Admit: 2011-03-06 | Discharge: 2011-03-06 | Disposition: A | Discharge status: Home / Self Care | Payer: Medicare Other | Source: Ambulatory Visit | Attending: Emergency Medicine | Admitting: Emergency Medicine

## 2011-03-06 ENCOUNTER — Inpatient Hospital Stay (INDEPENDENT_AMBULATORY_CARE_PROVIDER_SITE_OTHER)
Admission: RE | Admit: 2011-03-06 | Discharge: 2011-03-06 | Disposition: A | Discharge status: Home / Self Care | Payer: Medicare Other | Source: Ambulatory Visit | Attending: Family Medicine | Admitting: Family Medicine

## 2011-03-06 DIAGNOSIS — S8010XA Contusion of unspecified lower leg, initial encounter: Secondary | ICD-10-CM

## 2011-03-06 DIAGNOSIS — R609 Edema, unspecified: Secondary | ICD-10-CM

## 2011-03-06 DIAGNOSIS — M79609 Pain in unspecified limb: Secondary | ICD-10-CM | POA: Insufficient documentation

## 2011-03-06 LAB — CBC
Hemoglobin: 12.3 g/dL (ref 12.0–15.0)
Platelets: 246 10*3/uL (ref 150–400)
RBC: 4.27 MIL/uL (ref 3.87–5.11)
WBC: 5.4 10*3/uL (ref 4.0–10.5)

## 2011-03-06 LAB — D-DIMER, QUANTITATIVE: D-Dimer, Quant: <0.22 ug/mL-FEU (ref 0.00–0.48)

## 2011-03-07 NOTE — Discharge Summary (Signed)
NAME:  Kathy Owens, Kathy Owens               ACCOUNT NO.:  [id number]   MEDICAL RECORD NO.:  [id number]          PATIENT TYPE:  IPS   LOCATION:  4035                         FACILITY:  [facility]   PHYSICIAN:  Ranelle Oyster, M.D.DATE OF BIRTH:  February 01, 1956   DATE OF ADMISSION:  01/30/2007  DATE OF DISCHARGE:  02/10/2007                               DISCHARGE SUMMARY   DISCHARGE DIAGNOSES:  1. Cerebral palsy, with left lower extremity flexion contracture.  2. Left knee contusion.  3. Pain control.  4. Hypertension.  5. Hyperlipidemia.  6. Hypokalemia.  7. Subcutaneous Lovenox for deep vein thrombosis prophylaxis.   A 55 year old female with history of cerebral palsy, right total hip  replacement in the past, who was admitted January 24, 2007 after a fall in  which she struck her left knee.  Routine left knee films:  No acute  injury.  MRI of the left knee with deep bony contusion and moderate  joint effusion.  Orthopedic followup with Dr. Valma Cava, with  conservative care.  Placed on subcutaneous Lovenox for deep vein  thrombosis prophylaxis.  Pain control.  Weightbearing as tolerated.   PAST MEDICAL HISTORY:  See discharge diagnoses.   ALLERGIES:  PENICILLIN.   No alcohol or tobacco.   SOCIAL HISTORY:  Resident of [facility] independent living.  Local  sister in area, with limited assistance.   MEDICATIONS PRIOR TO ADMISSION:  Maxzide and Lipitor.   REHABILITATION HOSPITAL COURSE:  The patient was admitted to inpatient  rehab services, with therapies initiated on a 3-hour daily basis  consisting of physical therapy, occupational therapy, rehabilitation  nursing. The following issues were addressed during the patient's  rehabilitation stay.  Pertaining to Ms. Jividen cerebral palsy, with  left lower extremity flexion contracture, left knee contusion, she  continued can to do well.  She was supervision to minimal assist overall  for her mobility.  She was using Vicodin for a  limited basis for pain.  Baclofen had been added to her regimen for spasticity, titrated to 10 mg  4 times daily, with good results.  She would remain on her Maxzide for  hypertension.  She continued on Zocor for hyperlipidemia, which she  would resume her Lipitor on discharge.  She had no bowel or bladder  disturbances.  Functionally, she was bathing minimal assistance, simple  setup for upper body dressing, moderate assist lower body dressing,  overall supervision to minimal guard for her ambulation.  Strength and  endurance had greatly improved.  She was encouraged with her overall  progress, with plan of discharge to home.   Latest labs showed a hemoglobin of 10.8, hematocrit 31.3.  Sodium 136,  potassium 3.5, BUN 22, creatinine of 0.8.  She had some mildly elevated  liver function studies upon admission to rehab services, February 01, 2007,  repeated February 04, 2007, with great improvement.  She would follow up  with her primary M.D.   Kendell Bane MEDICATIONS AT TIME OF DICTATION:  1. Maxzide 37.5/25 mg 1 tablet daily.  2. Zocor 20 mg daily.  3. Baclofen 10 mg 4 times daily.  4.  Robaxin 500 mg q. 6 h. as needed for spasms.  5. Vicodin 5/325, 1 or 2 tablets q.4 h. as needed for pain, dispense      of 90 tablets.   DIET:  Regular.   She would follow up with Dr. Harvie Junior on an outpatient basis for medical  management, as well as Dr. Valma Cava for orthopedic care.  She can  see Dr. Faith Rogue on an as-needed  basis.      Mariam Dollar, P.A.      Ranelle Oyster, M.D.  Electronically Signed    DA/MEDQ  D:  02/10/2007  T:  02/10/2007  Job:  [id number]   cc:   Alvester Morin, M.D.  Erasmo Leventhal, M.D.

## 2011-03-07 NOTE — Consult Note (Signed)
NAME:  Kathy Owens, Paloma               ACCOUNT NO.:  [id number]   MEDICAL RECORD NO.:  [id number]          PATIENT TYPE:  INP   LOCATION:  5018                         FACILITY:  [facility]   PHYSICIAN:  Erasmo Leventhal, M.D.DATE OF BIRTH:  Jan 16, 1956   DATE OF CONSULTATION:  01/25/2007  DATE OF DISCHARGE:                                 CONSULTATION   TIME SEEN:  5:20.   HISTORY OF PRESENT ILLNESS:  [name] is a 55 year old Caucasian  female with history of cerebral palsy.  She is status post total hip  replacement by Dr. Lequita Halt in the past.  She previously lives alone and  is basically a household ambulatory with a walker.  She has a fixed  flexion deformity of her left knee with a fixed flexion contracture.  Despite this, she has been able to ambulate and take care of her daily  needs.  However, on January 23, 2007, she fell onto her left knee.  She  heard and felt a pop and had increasing pain.  She was admitted over the  weekend to the medical teaching service B, Dr. Harvie Junior.  Today, an  orthopedic consult was requested secondary to patient refusing  __________  with Dr. Lequita Halt, and I was on call.  This afternoon the  patient was aspirated by Dr. Cristal Deer End with internal medicine  service at which time 20 mL of bloody fluid was removed from the knee,  the patient feels much better.  The patient now only complains of left  knee pain.  She denies hip, groin, and thigh pain.   ALLERGIES:  PENICILLIN PRODUCES A RASH.   PAST MEDICAL HISTORY:  1. Cerebral palsy.  2. Hyperlipidemia.  3. Depression.  4. Osteonecrosis of the right hip status post total right hip      replacement by Dr. Lequita Halt.  5. Left leg venous insufficiency.  6. Amenorrhea.  7. Onychomycosis.  8. Iron deficiency anemia.  9. History of chronic osteomyelitis in the past.   MEDICATIONS:  For home medications please see reconciliation sheet.   SOCIAL HISTORY:  Lives alone.   FAMILY HISTORY:   Noncontributory.   PHYSICAL EXAMINATION:  GENERAL:  Well-developed, well-nourished, very  pleasant, Caucasian female in no acute distress.  VITAL SIGNS:  Blood pressure 110/70, pulse 80 and regular, respirations  18 unlabored, temperature 98.4.  UPPER EXTREMITIES:  Unremarkable.  RIGHT LOWER EXTREMITY:  Unremarkable.  LEFT LOWER EXTREMITY:  Flexed on a pillow.  Foot, ankle, leg, thigh, and  hip unremarkable.  Knee; no palpable effusion, not warm to touch, no  erythema.  Diffusely tender, more so medial.  She has a fixed flexed  contracture, full range of motion 25 to 95 degrees.  The patient can  actively flex the hip and actively flex the left knee and she has some  mild extension strength of the left knee.  She is nontender over the  patella tendon.  I believe the extension mechanism, is intact.  Cruciatae ligaments are stable to this exam, somewhat limited by her  fixed flexed contracture.  VASCULAR EXAMINATION:  Unremarkable.  No evidence of  meniscopathy.   DIAGNOSTICS:  Plain x-rays reviewed.  There is no acute fracture.  She  has an old ossicle off the inferior pole of the patella.  An MRI scan  was obtained, shows an inferomedial femoral bone contusion.  Somewhat  limited by position and movement, but no gross abnormalities or  disruptions.   IMPRESSION:  Left knee traumatic femoral bone contusion with secondary  hemarthrosis consistent with her history, examinations, and radiographic  studies.  Currently feeling better.   RECOMMENDATIONS:  I recommend ice on a p.r.n. basis.  Analgesics of  choice.  PT and OT consult with weight bear as tolerated, range of  motion of the left knee, active passive, and active assisted.  When the  patient is independent with ADLs, I think it would be reasonable to go  home with home health PT, OT as required.  No bracing or other measures  necessary and it would be very difficult secondary to her fixed flexure  contracture.  I discussed this  with the patient and encouraged and  answered all questions.  I will reassess her on Wednesday if she is  still here, if not I would like to see her back in the office at around  3 weeks from her injury.  All questions encouraged and answered.   I appreciate the kind referral, consult.           ______________________________  Erasmo Leventhal, M.D.     RAC/MEDQ  D:  01/25/2007  T:  01/26/2007  Job:  [id number]   cc:   Yvonne Kendall, M.D.  Alvester Morin, M.D.
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
## 2011-03-28 ENCOUNTER — Telehealth: Payer: Self-pay | Admitting: *Deleted

## 2011-03-28 NOTE — Telephone Encounter (Signed)
Pt called.  Stated mole on her back was rubbed off by her bra and wanted to know if she should be concerned. He sister did look at it; no bleeding. Stated site is sore but not painful.  I instructed pt to have her sister keep watching it for any sign of infection or increased pain or any bleeding and to call the clinic; she agreed.

## 2011-03-28 NOTE — Telephone Encounter (Signed)
Agree. She needs to bring up skin check at next appt.

## 2011-05-18 ENCOUNTER — Other Ambulatory Visit: Payer: Self-pay | Admitting: Internal Medicine

## 2011-05-19 ENCOUNTER — Telehealth: Payer: Self-pay | Admitting: *Deleted

## 2011-05-19 NOTE — Telephone Encounter (Signed)
Call from pt to ask how long she has been on Fosamax.  Pt said that she had been told that if she has been on the medication for more than 5 years then she may start to have bone breakage.  Spoke with Dr. Coralee Pesa. Pt was informed that she was started on the Fosamax in April of 2010.  Pt was informed that the benefits will far out weight the questionable side effects and to continue her medication.  Pt voiced an understanding of the plan.  Message to be forwarded to pt's PCP.

## 2011-07-07 ENCOUNTER — Other Ambulatory Visit: Payer: Self-pay | Admitting: Internal Medicine

## 2011-07-09 NOTE — Telephone Encounter (Signed)
Needs Oct appt for 6 month F/U

## 2011-07-09 NOTE — Telephone Encounter (Signed)
Pt has an appt Oct 8. Hydrocodone rx called to pharmacy.

## 2011-07-10 ENCOUNTER — Other Ambulatory Visit: Payer: Self-pay | Admitting: Internal Medicine

## 2011-07-14 ENCOUNTER — Other Ambulatory Visit: Payer: Self-pay | Admitting: *Deleted

## 2011-07-14 NOTE — Telephone Encounter (Signed)
Has appt 10/8

## 2011-07-15 MED ORDER — ATORVASTATIN CALCIUM 10 MG PO TABS: 10.0000 mg | ORAL_TABLET | Freq: Every day | ORAL | Status: DC

## 2011-07-15 MED ORDER — TRIAMTERENE-HCTZ 37.5-25 MG PO CAPS: 1.0000 | ORAL_CAPSULE | Freq: Every day | ORAL | Status: DC

## 2011-07-21 ENCOUNTER — Other Ambulatory Visit: Payer: Self-pay | Admitting: Internal Medicine

## 2011-07-28 ENCOUNTER — Ambulatory Visit (INDEPENDENT_AMBULATORY_CARE_PROVIDER_SITE_OTHER): Payer: PRIVATE HEALTH INSURANCE | Admitting: Internal Medicine

## 2011-07-28 ENCOUNTER — Encounter: Payer: Self-pay | Admitting: Internal Medicine

## 2011-07-28 VITALS — BP 147/80 | HR 72 | Temp 97.2°F | Wt 159.1 lb

## 2011-07-28 DIAGNOSIS — I10 Essential (primary) hypertension: Secondary | ICD-10-CM

## 2011-07-28 DIAGNOSIS — M25569 Pain in unspecified knee: Secondary | ICD-10-CM

## 2011-07-28 DIAGNOSIS — M25511 Pain in right shoulder: Secondary | ICD-10-CM

## 2011-07-28 DIAGNOSIS — Z299 Encounter for prophylactic measures, unspecified: Secondary | ICD-10-CM

## 2011-07-28 DIAGNOSIS — M25519 Pain in unspecified shoulder: Secondary | ICD-10-CM

## 2011-07-28 DIAGNOSIS — G8929 Other chronic pain: Secondary | ICD-10-CM

## 2011-07-28 DIAGNOSIS — M25512 Pain in left shoulder: Secondary | ICD-10-CM

## 2011-07-28 DIAGNOSIS — E785 Hyperlipidemia, unspecified: Secondary | ICD-10-CM

## 2011-07-28 MED ORDER — ATORVASTATIN CALCIUM 10 MG PO TABS: 10.0000 mg | ORAL_TABLET | Freq: Every day | ORAL | Status: DC

## 2011-07-28 MED ORDER — HYDROCODONE-ACETAMINOPHEN 5-500 MG PO TABS: 1.0000 | ORAL_TABLET | Freq: Four times a day (QID) | ORAL | Status: DC | PRN

## 2011-07-28 MED ORDER — TRIAMTERENE-HCTZ 37.5-25 MG PO TABS: 1.0000 | ORAL_TABLET | Freq: Every day | ORAL | Status: DC

## 2011-07-28 NOTE — Patient Instructions (Signed)
Shoulder Pain You were seen today with shoulder pain. The shoulder is a ball and socket joint. The muscles and tendons (rotator cuff) are what keep the shoulder in its joint and stable. This collection of muscles and tendons holds in the head (ball) of the humerus (upper arm bone) in the fossa (cup) of the scapula (shoulder blade). Today no reason was found for your shoulder pain. Often pain in the shoulder may be treated conservatively with temporary immobilization. For example, holding the shoulder in one place using a sling for rest. Physical therapy may be needed if problems continue. HOME CARE INSTRUCTIONS  Apply ice to the sore area for 5 minutes 3 times per day for the first 2 days. Put the ice in a plastic bag. Place a towel between the bag of ice and your skin.   If you have or were given a shoulder sling and straps, do not remove for 1 or until you see a caregiver for a follow-up examination. If you need to remove it to shower or bathe, move your arm as little as possible.   You may want to sleep on several pillows at night to lessen swelling and pain.   Only take over-the-counter or prescription medicines for pain, discomfort, or fever as directed by your caregiver.   It is very important to keep any follow-up appointments in order to avoid any type of permanent shoulder disability or chronic pain problems.  SEEK MEDICAL CARE IF:  Pain in your shoulder increases or new pain develops in your arm, hand, or fingers.   Your hand or fingers are colder than your other hand.   You do not obtain pain relief with the medications or your pain becomes worse.  SEEK IMMEDIATE MEDICAL CARE IF:  Your arm, hand, or fingers are numb or tingling.   Your arm, hand, or fingers are swollen, painful, or turn white or blue.   You develop chest pain or shortness of breath.  MAKE SURE YOU:   Understand these instructions.   Will watch your condition.   Will get help right away if you are not doing  well or get worse.  Document Released: 07/16/2005 Document Re-Released: 01/02/2009 [facility] Patient Information 2011 [place], [place].

## 2011-07-29 DIAGNOSIS — M25512 Pain in left shoulder: Secondary | ICD-10-CM | POA: Insufficient documentation

## 2011-07-29 DIAGNOSIS — M25511 Pain in right shoulder: Secondary | ICD-10-CM | POA: Insufficient documentation

## 2011-07-29 DIAGNOSIS — Z0000 Encounter for general adult medical examination without abnormal findings: Secondary | ICD-10-CM | POA: Insufficient documentation

## 2011-07-29 LAB — DRUGS OF ABUSE SCREEN W/O ALC, ROUTINE URINE
Amphetamine Screen, Ur: NEGATIVE
Barbiturate Quant, Ur: NEGATIVE
Benzodiazepines.: NEGATIVE
Cocaine Metabolites: NEGATIVE
Creatinine,U: 75.4 mg/dL
Opiate Screen, Urine: NEGATIVE

## 2011-07-29 NOTE — Assessment & Plan Note (Signed)
Patient's blood pressure is elevated today to grade 1. Given that patient is complaining of increased pain which can explain her elevated blood pressure, I would not start any blood pressure medications at this time. Given that patient has problem with her balance and being a patient of cerebral palsy the traditional JNC 7 cutoffs would probably not apply to her.  I would be careful with dropping her blood pressure too low.

## 2011-07-29 NOTE — Progress Notes (Signed)
  Subjective:    Patient ID: Kathy Owens, female    DOB: 07-Nov-1955, 55 y.o.   MRN: [id number]  HPI 55 year old female with past medical history most significant for cerebral palsy. Comes in today for a six-month followup. Patient visited [place] along with her church did not have any trouble with traveling.  Flu shot today.  Complaints of pain in the shoulder and knees which is unrelieved with current does of pain medications. She is taking up to 3 Vicodin per day.  Complaints of nevus//scar on her back which disappears and then reappears.   Review of Systems  Constitutional: Negative for fever, activity change and appetite change.  HENT: Negative for sore throat.   Respiratory: Negative for cough and shortness of breath.   Cardiovascular: Negative for chest pain and leg swelling.  Gastrointestinal: Negative for nausea, abdominal pain, diarrhea, constipation and abdominal distention.  Genitourinary: Negative for frequency, hematuria and difficulty urinating.  Musculoskeletal: Positive for back pain, arthralgias and gait problem.       Pain in both shoulder joints  Neurological: Negative for dizziness and headaches.  Psychiatric/Behavioral: Negative for suicidal ideas and behavioral problems.       Objective:   Physical Exam  Constitutional: She is oriented to person, place, and time. She appears well-developed and well-nourished. No distress.       Patient has both her feet pointing outwards and uses walker at all times  HENT:  Head: Normocephalic and atraumatic.  Eyes: Conjunctivae and EOM are normal. Pupils are equal, round, and reactive to light. No scleral icterus.  Neck: Normal range of motion. Neck supple. No JVD present. No thyromegaly present.  Cardiovascular: Normal rate, regular rhythm, normal heart sounds and intact distal pulses.  Exam reveals no gallop and no friction rub.   No murmur heard. Pulmonary/Chest: Effort normal and breath sounds normal. No respiratory  distress. She has no wheezes. She has no rales.  Abdominal: Soft. Bowel sounds are normal. She exhibits no distension and no mass. There is no tenderness. There is no rebound and no guarding.  Musculoskeletal: Normal range of motion. She exhibits no edema and no tenderness.  Lymphadenopathy:    She has no cervical adenopathy.  Neurological: She is alert and oriented to person, place, and time.  Skin:       I could not appreciate any abnormal lesions on her back  Psychiatric: She has a normal mood and affect. Her behavior is normal.          Assessment & Plan:

## 2011-07-29 NOTE — Assessment & Plan Note (Signed)
Patient's most recent cholesterol check was 6 months ago. No new changes or labs today.

## 2011-07-29 NOTE — Assessment & Plan Note (Signed)
Stable at this time 

## 2011-10-09 ENCOUNTER — Other Ambulatory Visit: Payer: Self-pay | Admitting: Internal Medicine

## 2011-11-06 ENCOUNTER — Other Ambulatory Visit: Payer: Self-pay | Admitting: Internal Medicine

## 2011-11-19 ENCOUNTER — Other Ambulatory Visit: Payer: Self-pay | Admitting: Internal Medicine

## 2011-11-20 ENCOUNTER — Other Ambulatory Visit: Payer: Self-pay | Admitting: Internal Medicine

## 2011-11-21 NOTE — Telephone Encounter (Signed)
Called to pharm 

## 2011-12-16 ENCOUNTER — Other Ambulatory Visit: Payer: Self-pay | Admitting: *Deleted

## 2011-12-16 NOTE — Telephone Encounter (Signed)
Pt is asking for refills on her Rx.so she doesn't have to call in each month.  Last filled 1/31 # 120  Do Friday

## 2011-12-19 ENCOUNTER — Other Ambulatory Visit: Payer: Self-pay | Admitting: Internal Medicine

## 2011-12-19 MED ORDER — HYDROCODONE-ACETAMINOPHEN 5-500 MG PO TABS: 1.0000 | ORAL_TABLET | Freq: Four times a day (QID) | ORAL | Status: DC | PRN

## 2011-12-19 NOTE — Telephone Encounter (Signed)
Rx called in 

## 2011-12-25 ENCOUNTER — Other Ambulatory Visit: Payer: Self-pay | Admitting: Internal Medicine

## 2011-12-25 DIAGNOSIS — Z1231 Encounter for screening mammogram for malignant neoplasm of breast: Secondary | ICD-10-CM

## 2012-01-04 ENCOUNTER — Other Ambulatory Visit: Payer: Self-pay | Admitting: Internal Medicine

## 2012-01-07 NOTE — Telephone Encounter (Signed)
Rx called in 

## 2012-01-20 ENCOUNTER — Other Ambulatory Visit: Payer: Self-pay | Admitting: Internal Medicine

## 2012-02-02 ENCOUNTER — Ambulatory Visit
Admission: RE | Admit: 2012-02-02 | Discharge: 2012-02-02 | Disposition: A | Discharge status: Home / Self Care | Payer: PRIVATE HEALTH INSURANCE | Source: Ambulatory Visit | Attending: Internal Medicine | Admitting: Internal Medicine

## 2012-02-02 DIAGNOSIS — Z1231 Encounter for screening mammogram for malignant neoplasm of breast: Secondary | ICD-10-CM

## 2012-03-29 ENCOUNTER — Other Ambulatory Visit: Payer: Self-pay | Admitting: Internal Medicine

## 2012-04-06 ENCOUNTER — Other Ambulatory Visit: Payer: Self-pay | Admitting: Internal Medicine

## 2012-04-07 ENCOUNTER — Other Ambulatory Visit: Payer: Self-pay | Admitting: *Deleted

## 2012-04-07 MED ORDER — HYDROCODONE-ACETAMINOPHEN 5-500 MG PO TABS: 1.0000 | ORAL_TABLET | Freq: Four times a day (QID) | ORAL | Status: DC | PRN

## 2012-04-07 NOTE — Telephone Encounter (Signed)
I refilled 120 tablets with 3 refills on 04/07/2012.

## 2012-04-07 NOTE — Telephone Encounter (Signed)
Rx called in to pharmacy and left message on pt home phone ID recording - Rx called in.

## 2012-07-13 ENCOUNTER — Other Ambulatory Visit: Payer: Self-pay | Admitting: Internal Medicine

## 2012-07-14 ENCOUNTER — Other Ambulatory Visit: Payer: Self-pay | Admitting: *Deleted

## 2012-07-14 MED ORDER — HYDROCODONE-ACETAMINOPHEN 5-500 MG PO TABS: 1.0000 | ORAL_TABLET | Freq: Four times a day (QID) | ORAL | Status: DC | PRN

## 2012-07-14 NOTE — Telephone Encounter (Signed)
Rx called in to pharmacy - pt aware can not be filled till 07/24/12 per pharmacy.

## 2012-07-24 ENCOUNTER — Other Ambulatory Visit: Payer: Self-pay | Admitting: Internal Medicine

## 2012-07-29 ENCOUNTER — Ambulatory Visit (INDEPENDENT_AMBULATORY_CARE_PROVIDER_SITE_OTHER): Payer: PRIVATE HEALTH INSURANCE | Admitting: Internal Medicine

## 2012-07-29 VITALS — BP 130/87 | HR 78 | Temp 97.1°F | Wt 154.6 lb

## 2012-07-29 DIAGNOSIS — I10 Essential (primary) hypertension: Secondary | ICD-10-CM

## 2012-07-29 DIAGNOSIS — M949 Disorder of cartilage, unspecified: Secondary | ICD-10-CM

## 2012-07-29 DIAGNOSIS — I872 Venous insufficiency (chronic) (peripheral): Secondary | ICD-10-CM

## 2012-07-29 DIAGNOSIS — Z23 Encounter for immunization: Secondary | ICD-10-CM

## 2012-07-29 DIAGNOSIS — Z299 Encounter for prophylactic measures, unspecified: Secondary | ICD-10-CM

## 2012-07-29 DIAGNOSIS — M899 Disorder of bone, unspecified: Secondary | ICD-10-CM

## 2012-07-29 LAB — COMPLETE METABOLIC PANEL WITH GFR
ALT: 16 U/L (ref 0–35)
Albumin: 4.8 g/dL (ref 3.5–5.2)
Alkaline Phosphatase: 35 U/L — ABNORMAL LOW (ref 39–117)
CO2: 25 mEq/L (ref 19–32)
GFR, Est African American: 66 mL/min
GFR, Est Non African American: 57 mL/min — ABNORMAL LOW
Glucose, Bld: 88 mg/dL (ref 70–99)
Potassium: 3.8 mEq/L (ref 3.5–5.3)
Sodium: 141 mEq/L (ref 135–145)
Total Bilirubin: 0.8 mg/dL (ref 0.3–1.2)
Total Protein: 7 g/dL (ref 6.0–8.3)

## 2012-07-29 MED ORDER — HYDROCODONE-ACETAMINOPHEN 5-500 MG PO TABS: 1.0000 | ORAL_TABLET | Freq: Four times a day (QID) | ORAL | Status: DC | PRN

## 2012-07-29 MED ORDER — ATORVASTATIN CALCIUM 10 MG PO TABS: 10.0000 mg | ORAL_TABLET | Freq: Every day | ORAL | Status: DC

## 2012-07-29 MED ORDER — TRIAMTERENE-HCTZ 37.5-25 MG PO TABS: 1.0000 | ORAL_TABLET | Freq: Every day | ORAL | Status: DC

## 2012-07-29 NOTE — Assessment & Plan Note (Addendum)
Well controlled at current dose of medications. I will check CMET today.

## 2012-07-29 NOTE — Assessment & Plan Note (Addendum)
Flu shot today. Patient feels more tired recently although denies any hair loss or change in bowel movements. I will check TSH today.

## 2012-07-29 NOTE — Assessment & Plan Note (Signed)
Patient had been on fosamax for many years. I would repeat a dexa scan after stopping fosamax for few months.

## 2012-07-29 NOTE — Progress Notes (Signed)
  Subjective:    Patient ID: Kathy Owens, female    DOB: 15-May-1956, 56 y.o.   MRN: [id number]  HPI Patient has lost about 5 pounds and feels good. She does feel a little more sleepy than usual.   Patient has been on fosamax for a very long time for osteopenia.  Her BP has been just above the upper limit of normal.  Flu shot today.    Review of Systems  Constitutional: Negative for fever, activity change and appetite change.  HENT: Negative for sore throat.   Respiratory: Negative for cough and shortness of breath.   Cardiovascular: Negative for chest pain and leg swelling.  Gastrointestinal: Negative for nausea, abdominal pain, diarrhea, constipation and abdominal distention.  Genitourinary: Negative for frequency, hematuria and difficulty urinating.  Neurological: Negative for dizziness and headaches.  Psychiatric/Behavioral: Negative for suicidal ideas and behavioral problems.       Objective:   Physical Exam  Constitutional: She is oriented to person, place, and time. She appears well-developed and well-nourished.  HENT:  Head: Normocephalic and atraumatic.  Eyes: Conjunctivae normal and EOM are normal. Pupils are equal, round, and reactive to light. No scleral icterus.  Neck: Normal range of motion. Neck supple. No JVD present. No thyromegaly present.  Cardiovascular: Normal rate, regular rhythm, normal heart sounds and intact distal pulses.  Exam reveals no gallop and no friction rub.   No murmur heard. Pulmonary/Chest: Effort normal and breath sounds normal. No respiratory distress. She has no wheezes. She has no rales.  Abdominal: Soft. Bowel sounds are normal. She exhibits no distension and no mass. There is no tenderness. There is no rebound and no guarding.  Musculoskeletal: Normal range of motion. She exhibits no edema and no tenderness.       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  Neurological: She is alert and oriented to person, place, and time.  Psychiatric: She has a normal mood and affect. Her behavior is normal.          Assessment & Plan:

## 2012-07-29 NOTE — Patient Instructions (Signed)

## 2012-07-29 NOTE — Assessment & Plan Note (Signed)
Patient continues to wear ted hoses and is doing well.

## 2012-08-02 ENCOUNTER — Encounter: Payer: PRIVATE HEALTH INSURANCE | Admitting: Internal Medicine

## 2012-08-03 ENCOUNTER — Encounter: Payer: PRIVATE HEALTH INSURANCE | Admitting: Internal Medicine

## 2013-01-05 ENCOUNTER — Other Ambulatory Visit: Payer: Self-pay | Admitting: Internal Medicine

## 2013-01-05 ENCOUNTER — Other Ambulatory Visit: Payer: Self-pay | Admitting: *Deleted

## 2013-01-05 MED ORDER — HYDROCODONE-ACETAMINOPHEN 5-500 MG PO TABS: 1.0000 | ORAL_TABLET | Freq: Four times a day (QID) | ORAL | Status: DC | PRN

## 2013-01-05 NOTE — Telephone Encounter (Signed)
PLEASE CHANGE TO 5/325

## 2013-01-05 NOTE — Telephone Encounter (Signed)
Called to pharm 

## 2013-03-19 ENCOUNTER — Other Ambulatory Visit: Payer: Self-pay | Admitting: Internal Medicine

## 2013-03-22 ENCOUNTER — Telehealth: Payer: Self-pay | Admitting: Internal Medicine

## 2013-03-22 MED ORDER — TRIAMTERENE-HCTZ 37.5-25 MG PO TABS: 1.0000 | ORAL_TABLET | Freq: Every day | ORAL | Status: DC

## 2013-03-22 NOTE — Telephone Encounter (Signed)
  Reason for call:   I placed an outgoing call to Ms. Lorain K [place] at 6:35 pm to return a page that was received.  She states that she had run out of her Maxzide today and had not been able to get a refill from the pharmacy.     Last encounter / Pertinent Data:   She was last seen in Clinic by Dr. Eben Burow in October of 2013 and was advised for 6 month follow up.    Assessment/ Plan:   I will refill her Maxzide today and advised her to call the clinic phone number to schedule a follow up appointment.  I will also forward the note to the front desk to help facilitate a follow up appointment.   As always, pt is advised that if symptoms worsen or new symptoms arise, they should go to an urgent care facility or to to ER for further evaluation.   Leodis Sias, MD   03/22/2013, 6:36 PM

## 2013-03-23 ENCOUNTER — Encounter: Payer: Self-pay | Admitting: Internal Medicine

## 2013-04-19 ENCOUNTER — Ambulatory Visit: Payer: PRIVATE HEALTH INSURANCE | Admitting: Internal Medicine

## 2013-05-03 ENCOUNTER — Encounter: Payer: Self-pay | Admitting: Internal Medicine

## 2013-05-03 ENCOUNTER — Ambulatory Visit (INDEPENDENT_AMBULATORY_CARE_PROVIDER_SITE_OTHER): Payer: PRIVATE HEALTH INSURANCE | Admitting: Internal Medicine

## 2013-05-03 VITALS — BP 136/87 | HR 85 | Temp 96.8°F | Ht 58.25 in | Wt 151.4 lb

## 2013-05-03 DIAGNOSIS — H6121 Impacted cerumen, right ear: Secondary | ICD-10-CM

## 2013-05-03 DIAGNOSIS — I10 Essential (primary) hypertension: Secondary | ICD-10-CM

## 2013-05-03 DIAGNOSIS — Z299 Encounter for prophylactic measures, unspecified: Secondary | ICD-10-CM

## 2013-05-03 DIAGNOSIS — M25519 Pain in unspecified shoulder: Secondary | ICD-10-CM

## 2013-05-03 DIAGNOSIS — H6120 Impacted cerumen, unspecified ear: Secondary | ICD-10-CM

## 2013-05-03 DIAGNOSIS — M25511 Pain in right shoulder: Secondary | ICD-10-CM

## 2013-05-03 MED ORDER — HYDROCODONE-ACETAMINOPHEN 10-325 MG PO TABS: 1.0000 | ORAL_TABLET | Freq: Four times a day (QID) | ORAL | Status: DC | PRN

## 2013-05-03 MED ORDER — CARBAMIDE PEROXIDE 6.5 % OT SOLN: 5.0000 [drp] | Freq: Two times a day (BID) | OTIC | Status: DC

## 2013-05-03 MED ORDER — TRIAMTERENE-HCTZ 37.5-25 MG PO TABS: 1.0000 | ORAL_TABLET | Freq: Every day | ORAL | Status: DC

## 2013-05-03 MED ORDER — ATORVASTATIN CALCIUM 10 MG PO TABS: 10.0000 mg | ORAL_TABLET | Freq: Every day | ORAL | Status: DC

## 2013-05-03 NOTE — Progress Notes (Signed)
Subjective:     Patient ID: Kathy Owens, female   DOB: 24-Nov-1955, 57 y.o.   MRN: [id number]  HPI [name] is a very pleasant and surpisingly upbeat white female with cerebral palsy who follows with the clinic for hypertension, hyperlipidemia, and varied joint pains including chronic shoulder pain. She also visits [facility] orthopedics. Today she comes in with two specific complaints.   Worsening right shoulder pain - The patient has been experiencing increasing shoulder pain for the past couple weeks with inability to lift her arm above her head. She has no weakness in the arm, and denies numbness or tingling. The pain is not relieved by the current vicodin 5-325 that she is taking, and she would like to have something stronger. She has the most pain at night, and it is relieved when she is working. She complains of morning stiffness for 15-20 minutes every morning. She attributes the pain to using a heavy cart that she recently transitioned to, for doing grocery shopping. She denies swelling of the joint. She denies left shoulder pain today.   Decreased hearing - The patient thinks she is hearing less. She is not able to lateralize it. She denies any infection, discharge or pain in her ears. She denies any fever or chills.   Social situation - Though the patient has cerebral palsy, she is very independent and is able to do her ADLs and IADLs. She lives alone, is never married. As her family, she has only one younger sister, 63 years younger to her, who visits her every so often and they are very close per patient.     Review of Systems As per HPI    Objective:   Physical Exam General: No acute distress, uses a rolling walker to walk. Alert and oriented times 3. HEENT: No abnormalities detected except hard wax in right ear CV: S1S2 RRR, no murmur Resp: Bilateral vesicular sounds. Abdomen: Non-tender, soft. No pedal edema. Normal peripheral pulses. Right shoulder: No erythema,  swelling or tenderness. Range motion restricted for overhead  abduction. Cannot do Apley scratch test.      Assessment & Plan:     Chronic Right Shoulder pain - Likely glenohumeral osteoarthritis. I have given the patient increased strength vicodin for now. We will also do a Sports Medicine referral.   Cerumin Impaction - Debrox ear drops given to be used for 4-5 days. The patient can come back to get wax removal.   Hypertension - Well controlled. We reviewed all the medications and the patient will stay course with the current treatment.   Preventive Measure - mammogram referral.

## 2013-05-03 NOTE — Patient Instructions (Addendum)
Kathy Owens,  You have wax in your right ear. Please put the ear drops I have given you 2 times a day for 4-5 days. You can go to urgent care, or come back to clinic to get it removed. Your blood pressure is good - please keep taking your BP medicines. For your shoulder pain, I have increased your Vicodin dose.  We will give you a sports medicine referral. You can also go to your ortho doctor if you want for your shoulder. We have given you a mammogram referral.   Let us meet in 3 months  Thanks, Aletta Edouard MD MPH 05/03/2013 11:03 AM

## 2013-05-11 ENCOUNTER — Emergency Department (INDEPENDENT_AMBULATORY_CARE_PROVIDER_SITE_OTHER)
Admission: EM | Admit: 2013-05-11 | Discharge: 2013-05-11 | Disposition: A | Discharge status: Home / Self Care | Payer: PRIVATE HEALTH INSURANCE | Source: Home / Self Care | Attending: Emergency Medicine | Admitting: Emergency Medicine

## 2013-05-11 ENCOUNTER — Encounter (HOSPITAL_COMMUNITY): Payer: Self-pay | Admitting: *Deleted

## 2013-05-11 DIAGNOSIS — H6120 Impacted cerumen, unspecified ear: Secondary | ICD-10-CM

## 2013-05-11 DIAGNOSIS — H6121 Impacted cerumen, right ear: Secondary | ICD-10-CM

## 2013-05-11 MED ORDER — FLUTICASONE PROPIONATE 50 MCG/ACT NA SUSP: 2.0000 | Freq: Every day | NASAL | Status: DC

## 2013-05-11 NOTE — ED Notes (Signed)
Pt reports 3 weeks of bilateral ear pain and fullness.  She thinks she has ear wax buildup

## 2013-05-11 NOTE — ED Provider Notes (Signed)
Chief Complaint:   Chief Complaint  Patient presents with  . Otalgia    History of Present Illness:   Kathy Owens is a 57 year old female who has a sensation of wax buildup in both ears. She has been putting wax softening drops in the ears under the instruction of her primary care doctor and was told to come here today to have the ears cleaned by syringe. She denies any ear pain. She's had no headache, fever, nasal congestion, rhinorrhea, sneezing, sore throat, adenopathy, or cough. She has had problems with impacted ear wax in the past. She does use Q-tips to clean her ears.  Review of Systems:  Other than noted above, the patient denies any of the following symptoms: Systemic:  No fevers, chills, sweats, weight loss or gain, fatigue, or tiredness. Eye:  No redness, pain, discharge, itching, blurred vision, or diplopia. ENT:  No headache, nasal congestion, sneezing, itching, epistaxis, ear pain, congestion, decreased hearing, ringing in ears, vertigo, or tinnitus.  No oral lesions, sore throat, pain on swallowing, or hoarseness. Neck:  No mass, tenderness or adenopathy. Lungs:  No coughing, wheezing, or shortness of breath. Skin:  No rash or itching.  PMFSH:  Past medical history, family history, social history, meds, and allergies were reviewed. She has no medication allergies. She takes hydrocodone, Lipitor, aspirin, and triamterene/HCTZ. She has hypertension, hypercholesterolemia, and cerebral palsy.  Physical Exam:   Vital signs:  BP 156/76  Pulse 68  Temp(Src) 98.8 F (37.1 C) (Oral)  Resp 18  SpO2 100% General:  Alert and oriented.  In no distress.  Skin warm and dry. Eye:  PERRL, full EOMs, lids and conjunctiva normal.   ENT:  There was a cerumen impaction in the right ear canal. The left canal and TM are completely normal.  Nasal mucosa not congested and without drainage.  Mucous membranes moist, no oral lesions, normal dentition, pharynx clear.  No cranial or facial pain to  palplation. Neck:  Supple, full ROM.  No adenopathy, tenderness or mass.  Thyroid normal. Lungs:  Breath sounds clear and equal bilaterally.  No wheezes, rales or rhonchi. Heart:  Rhythm regular, without extrasystoles.  No gallops or murmers. Skin:  Clear, warm and dry.  Course in Urgent Care Center:   The cerumen impaction in the right ear canal was curettaged out with warm water and hydrogen peroxide. Thereafter the patient experienced immediate relief. Examination revealed normal ear canal and TM thereafter.  Assessment:  The encounter diagnosis was Cerumen impaction, right.  The patient was told not to use Q-tips and if she should have some wax buildup she may want to again try the over-the-counter drops. The patient also mentions that she does have some sensation of congestion in her left ear, although this ear was completely normal without any wax impaction or fluid behind the eardrum and it may be that she's got some eustachian tube dysfunction contributing to her ear symptoms, therefore I gave her a prescription for Flonase nasal spray and suggested she followup with ENT if no improvement in 2 weeks.  Plan:   1.  The following meds were prescribed:   New Prescriptions   FLUTICASONE (FLONASE) 50 MCG/ACT NASAL SPRAY    Place 2 sprays into the nose daily.   2.  The patient was instructed in symptomatic care and handouts were given. 3.  The patient was told to return if becoming worse in any way, if no better in 3 or 4 days, and given some red flag  symptoms such as ear pain or difficulty hearing that would indicate earlier return. 4.  Follow up with Dr. Suszanne Conners if needed.    Reuben Likes, MD 05/11/13 [phone]

## 2013-05-17 ENCOUNTER — Ambulatory Visit: Payer: PRIVATE HEALTH INSURANCE | Admitting: Family Medicine

## 2013-05-18 ENCOUNTER — Ambulatory Visit (HOSPITAL_COMMUNITY)
Admission: RE | Admit: 2013-05-18 | Discharge: 2013-05-18 | Disposition: A | Discharge status: Home / Self Care | Payer: PRIVATE HEALTH INSURANCE | Source: Ambulatory Visit | Attending: Internal Medicine | Admitting: Internal Medicine

## 2013-05-18 DIAGNOSIS — Z1231 Encounter for screening mammogram for malignant neoplasm of breast: Secondary | ICD-10-CM | POA: Insufficient documentation

## 2013-05-18 DIAGNOSIS — Z299 Encounter for prophylactic measures, unspecified: Secondary | ICD-10-CM

## 2013-05-31 ENCOUNTER — Ambulatory Visit: Payer: PRIVATE HEALTH INSURANCE | Admitting: Family Medicine

## 2013-06-21 ENCOUNTER — Other Ambulatory Visit: Payer: Self-pay | Admitting: Internal Medicine

## 2013-06-21 NOTE — Telephone Encounter (Signed)
Called to pharm 

## 2013-06-22 NOTE — Telephone Encounter (Signed)
md called in

## 2013-07-04 NOTE — Addendum Note (Signed)
Addended by: Neomia Dear on: 07/04/2013 08:05 AM   Modules accepted: Orders

## 2013-07-14 ENCOUNTER — Telehealth: Payer: Self-pay | Admitting: *Deleted

## 2013-07-14 NOTE — Telephone Encounter (Signed)
Per contract the next refill will be on the due date only, Sep 30th.
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
## 2013-07-14 NOTE — Telephone Encounter (Signed)
Pt called and informed of Dr Dorris Singh response and states she understands; states she will call back Monday to request for a refill for the 30th.

## 2013-07-18 ENCOUNTER — Other Ambulatory Visit: Payer: Self-pay | Admitting: *Deleted

## 2013-07-18 NOTE — Telephone Encounter (Signed)
Call from pt - states it's due tomorrow.

## 2013-07-18 NOTE — Telephone Encounter (Signed)
Pt called and informed of refill refusal per Dr Dalphine Handing.  She scheduled an appt for tomorrow to discuss med refill; she will see Dr Yetta Barre (already has an appt w/Dr Dalphine Handing in Nov).

## 2013-07-18 NOTE — Telephone Encounter (Signed)
Please schedule the patient for an appointment for increased pain medicine requirement, possibly a new pain contract, and a possible subspecialty referral if needed.

## 2013-07-19 ENCOUNTER — Ambulatory Visit (INDEPENDENT_AMBULATORY_CARE_PROVIDER_SITE_OTHER): Payer: PRIVATE HEALTH INSURANCE | Admitting: Internal Medicine

## 2013-07-19 ENCOUNTER — Encounter: Payer: Self-pay | Admitting: Internal Medicine

## 2013-07-19 VITALS — BP 140/82 | HR 94 | Temp 98.7°F | Ht 60.0 in | Wt 153.1 lb

## 2013-07-19 DIAGNOSIS — Z299 Encounter for prophylactic measures, unspecified: Secondary | ICD-10-CM

## 2013-07-19 DIAGNOSIS — M25519 Pain in unspecified shoulder: Secondary | ICD-10-CM

## 2013-07-19 DIAGNOSIS — Z23 Encounter for immunization: Secondary | ICD-10-CM

## 2013-07-19 DIAGNOSIS — M25511 Pain in right shoulder: Secondary | ICD-10-CM

## 2013-07-19 DIAGNOSIS — I10 Essential (primary) hypertension: Secondary | ICD-10-CM

## 2013-07-19 DIAGNOSIS — G809 Cerebral palsy, unspecified: Secondary | ICD-10-CM

## 2013-07-19 MED ORDER — HYDROCODONE-ACETAMINOPHEN 10-325 MG PO TABS: ORAL_TABLET | ORAL | Status: DC

## 2013-07-19 NOTE — Progress Notes (Signed)
I met with the patient personally and signed a new pain contract with her for Norco 10-325 q6 hours prn as needed for pain, for her multiple joint severe osteoarthritic disease. She will also be referred to sports medicine for her worsening shoulder pain.   I saw and evaluated the patient.  I personally confirmed the key portions of the history and exam documented by Dr. Yetta Barre and I reviewed pertinent patient test results.  The assessment, diagnosis, and plan were formulated together and I agree with the documentation in the resident's note.

## 2013-07-19 NOTE — Assessment & Plan Note (Addendum)
Patient presents today with chronic right shoulder pain. She was seen in July for the same issue and was given a dosage increase of her pain medication from 5-325 Norco to 10-325 Norco. This change was thought to be only a temporary measure, however, she says the lower dose still will not control her pain adequately. -The patient was again given Norco 10-325 #120 to take q6h for pain. She was previously contracted for Vicodin 5/500 from 2012. A new pain contract was given to the patient and signed for this new dosage of Norco (w/ her PCP Dr. Dalphine Handing) for #120 Norco 10-325. She agreed with all issues discussed in the pain contract. -She was also given another referral to see Sports Medicine for the near future to further investigate the shoulder pain.

## 2013-07-19 NOTE — Assessment & Plan Note (Signed)
Patient presents to the clinic with younger sister today. The patient is ambulatory, verbal, answering questions appropriately, and able to perform ADL's and IADL's on her own. She has some joint deformities (valgus ankle deformities) and mild cognitive impairment. Patient understands treatment plan and reasoning.

## 2013-07-19 NOTE — Patient Instructions (Addendum)
1. Please schedule a follow up appointment for 3-4 months  2. Please take all medications as prescribed.   3. If you have worsening of your symptoms or new symptoms arise, please call the clinic ([phone]), or go to the ER immediately if symptoms are severe.  You have done a great job in taking all your medications. I appreciate it very much. Please continue doing that.\ 

## 2013-07-19 NOTE — Assessment & Plan Note (Signed)
Given flu shot  today 

## 2013-07-19 NOTE — Assessment & Plan Note (Signed)
At goal today, 140/82. Continue triamterene-HCTZ 37.5-25.
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
## 2013-07-26 ENCOUNTER — Encounter: Payer: PRIVATE HEALTH INSURANCE | Admitting: Internal Medicine

## 2013-08-10 ENCOUNTER — Ambulatory Visit
Admission: RE | Admit: 2013-08-10 | Discharge: 2013-08-10 | Disposition: A | Discharge status: Home / Self Care | Payer: PRIVATE HEALTH INSURANCE | Source: Ambulatory Visit | Attending: Emergency Medicine | Admitting: Emergency Medicine

## 2013-08-10 ENCOUNTER — Encounter: Payer: Self-pay | Admitting: Emergency Medicine

## 2013-08-10 ENCOUNTER — Ambulatory Visit (INDEPENDENT_AMBULATORY_CARE_PROVIDER_SITE_OTHER): Payer: PRIVATE HEALTH INSURANCE | Admitting: Emergency Medicine

## 2013-08-10 VITALS — BP 136/78 | Ht 60.0 in | Wt 155.0 lb

## 2013-08-10 DIAGNOSIS — M12811 Other specific arthropathies, not elsewhere classified, right shoulder: Secondary | ICD-10-CM

## 2013-08-10 DIAGNOSIS — M7541 Impingement syndrome of right shoulder: Secondary | ICD-10-CM | POA: Insufficient documentation

## 2013-08-10 DIAGNOSIS — M25511 Pain in right shoulder: Secondary | ICD-10-CM

## 2013-08-10 DIAGNOSIS — M19019 Primary osteoarthritis, unspecified shoulder: Secondary | ICD-10-CM

## 2013-08-10 DIAGNOSIS — M25519 Pain in unspecified shoulder: Secondary | ICD-10-CM
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
## 2013-08-10 NOTE — Assessment & Plan Note (Signed)
Findings today were consistent with rotator cuff arthropathy/tendinopathy and suggestive of glenohumeral joint arthritis as well. She was given Jobe exercises for strengthening. X-rays of the right shoulder were ordered today. She'll follow followup in 4 weeks' time.

## 2013-08-15 ENCOUNTER — Other Ambulatory Visit: Payer: Self-pay | Admitting: *Deleted

## 2013-08-15 ENCOUNTER — Telehealth: Payer: Self-pay | Admitting: *Deleted

## 2013-08-15 DIAGNOSIS — M25511 Pain in right shoulder: Secondary | ICD-10-CM

## 2013-08-15 MED ORDER — HYDROCODONE-ACETAMINOPHEN 10-325 MG PO TABS: ORAL_TABLET | ORAL | Status: DC

## 2013-08-15 NOTE — Telephone Encounter (Signed)
States she wants to pick up rx today since she was have transportation

## 2013-08-15 NOTE — Telephone Encounter (Signed)
Pt picked up rx today.

## 2013-08-15 NOTE — Telephone Encounter (Signed)
Call from [facility] pharmacy - pt requesting early refill on Norco; wants to refill today instead of 08/18/13. Talked to Dr Dalphine Handing - cannot be refill until 10/30 as written on rx; [facility] informed.

## 2013-09-07 ENCOUNTER — Ambulatory Visit (INDEPENDENT_AMBULATORY_CARE_PROVIDER_SITE_OTHER): Payer: PRIVATE HEALTH INSURANCE | Admitting: Emergency Medicine

## 2013-09-07 ENCOUNTER — Encounter: Payer: Self-pay | Admitting: Emergency Medicine

## 2013-09-07 VITALS — BP 152/89 | HR 61 | Ht 60.0 in | Wt 155.0 lb

## 2013-09-07 DIAGNOSIS — M7541 Impingement syndrome of right shoulder: Secondary | ICD-10-CM

## 2013-09-07 DIAGNOSIS — M75100 Unspecified rotator cuff tear or rupture of unspecified shoulder, not specified as traumatic: Secondary | ICD-10-CM

## 2013-09-07 NOTE — Assessment & Plan Note (Addendum)
X-rays consistent with acromial spurs and narrowing of space between humeral head and acromion. Patient admits to limited compliance with Jobe exercises. States she will do a more routine now. Reviewed method for doing exercises in the office today. Was given a corticosteroid injection today. Will followup as needed.

## 2013-09-07 NOTE — Progress Notes (Signed)
Patient ID: Kathy Owens, female   DOB: 08-29-1956, 57 y.o.   MRN: [id number] 57 year old female with a history of CP presents for followup for right shoulder pain. Has been doing exercises at home but only limited amounts. Pain continues when reaching overhead. Feels her range of motion is somewhat better however. No new injuries. Moderate relief Naprosyn.  Pertinent past medical history: Cerebral palsy and osteopenia.  Social history: Nonsmoker.  Review of systems as per history of present illness otherwise negative  Examination: Well-developed well-nourished 57 year old female awake alert and oriented in no acute distress BP 152/89  Pulse 61  Ht 5' (1.524 m)  Wt 155 lb (70.308 kg)  BMI 30.27 kg/m2  Right shoulder:  Forward flexion abduction 160, internal rotation to lower lumbar region, external rotation grossly 20. Positive Hawkins Neer and empty can Negative O'Brien Strength 4+/5 abduction and forward flexion  Neurovascularly intact  Procedure:  Injection of right subacromial space Consent obtained and verified. Time-out conducted. Noted no overlying erythema, induration, or other signs of local infection. Skin prepped in a sterile fashion. Topical analgesic spray: Ethyl chloride. Completed without difficulty. Meds: 1:3 Depo-Medrol: Xylocaine Pain immediately improved suggesting accurate placement of the medication. Advised to call if fevers/chills, erythema, induration, drainage, or persistent bleeding.

## 2013-09-12 ENCOUNTER — Other Ambulatory Visit: Payer: Self-pay | Admitting: *Deleted

## 2013-09-12 DIAGNOSIS — M25511 Pain in right shoulder: Secondary | ICD-10-CM

## 2013-09-12 MED ORDER — HYDROCODONE-ACETAMINOPHEN 10-325 MG PO TABS: ORAL_TABLET | ORAL | Status: DC

## 2013-09-12 NOTE — Telephone Encounter (Signed)
If you are ok with it please print three months worth

## 2013-09-12 NOTE — Telephone Encounter (Signed)
Called pt, left message on vmail

## 2013-09-13 ENCOUNTER — Encounter: Payer: PRIVATE HEALTH INSURANCE | Admitting: Internal Medicine

## 2013-09-19 ENCOUNTER — Ambulatory Visit: Payer: Self-pay | Admitting: Podiatry

## 2013-10-24 ENCOUNTER — Ambulatory Visit: Payer: Self-pay | Admitting: Podiatry

## 2013-11-03 ENCOUNTER — Ambulatory Visit: Payer: Self-pay | Admitting: Podiatry

## 2013-11-17 ENCOUNTER — Ambulatory Visit: Payer: Self-pay | Admitting: Podiatry

## 2013-11-22 ENCOUNTER — Ambulatory Visit: Payer: PRIVATE HEALTH INSURANCE | Admitting: Internal Medicine

## 2013-11-24 ENCOUNTER — Encounter: Payer: Self-pay | Admitting: Podiatry

## 2013-11-24 ENCOUNTER — Ambulatory Visit (INDEPENDENT_AMBULATORY_CARE_PROVIDER_SITE_OTHER): Payer: Medicare Other | Admitting: Podiatry

## 2013-11-24 VITALS — BP 137/82 | HR 66 | Resp 16

## 2013-11-24 DIAGNOSIS — B351 Tinea unguium: Secondary | ICD-10-CM

## 2013-11-24 DIAGNOSIS — M79609 Pain in unspecified limb: Secondary | ICD-10-CM

## 2013-11-24 NOTE — Progress Notes (Signed)
Subjective:     Patient ID: Kathy Owens, female   DOB: 1956-05-22, 58 y.o.   MRN: [id number]  HPI patient presents with painful nailbeds 1-5 both feet that she cannot take care of herself   Review of Systems     Objective:   Physical Exam Neurovascular status unchanged with thick nailbeds and pain 1-5 both feet    Assessment:     I can't nail infection with pain 1-5 both feet    Plan:     Debridement painful nail bed 1-5 both feet with no bleeding noted

## 2013-12-07 ENCOUNTER — Other Ambulatory Visit: Payer: Self-pay | Admitting: *Deleted

## 2013-12-07 DIAGNOSIS — M25512 Pain in left shoulder: Principal | ICD-10-CM

## 2013-12-07 DIAGNOSIS — M25511 Pain in right shoulder: Secondary | ICD-10-CM

## 2013-12-08 MED ORDER — HYDROCODONE-ACETAMINOPHEN 10-325 MG PO TABS: ORAL_TABLET | ORAL | Status: DC

## 2013-12-20 ENCOUNTER — Encounter: Payer: PRIVATE HEALTH INSURANCE | Admitting: Internal Medicine

## 2014-01-09 ENCOUNTER — Other Ambulatory Visit: Payer: Self-pay | Admitting: *Deleted

## 2014-01-09 DIAGNOSIS — M25512 Pain in left shoulder: Principal | ICD-10-CM

## 2014-01-09 DIAGNOSIS — M25511 Pain in right shoulder: Secondary | ICD-10-CM

## 2014-01-09 MED ORDER — HYDROCODONE-ACETAMINOPHEN 10-325 MG PO TABS: ORAL_TABLET | ORAL | Status: DC

## 2014-01-09 NOTE — Telephone Encounter (Signed)
Pt would like to pick rx today since her sister can provide transportation. Thanks

## 2014-01-09 NOTE — Telephone Encounter (Signed)
Rx ready for pick-up; pt called - message left. 

## 2014-01-24 ENCOUNTER — Ambulatory Visit (INDEPENDENT_AMBULATORY_CARE_PROVIDER_SITE_OTHER): Payer: PRIVATE HEALTH INSURANCE | Admitting: Internal Medicine

## 2014-01-24 ENCOUNTER — Encounter: Payer: Self-pay | Admitting: Internal Medicine

## 2014-01-24 VITALS — BP 133/84 | HR 87 | Temp 98.1°F | Wt 154.5 lb

## 2014-01-24 DIAGNOSIS — K219 Gastro-esophageal reflux disease without esophagitis: Secondary | ICD-10-CM

## 2014-01-24 DIAGNOSIS — Z299 Encounter for prophylactic measures, unspecified: Secondary | ICD-10-CM

## 2014-01-24 DIAGNOSIS — M25512 Pain in left shoulder: Secondary | ICD-10-CM

## 2014-01-24 DIAGNOSIS — E785 Hyperlipidemia, unspecified: Secondary | ICD-10-CM

## 2014-01-24 DIAGNOSIS — N289 Disorder of kidney and ureter, unspecified: Secondary | ICD-10-CM

## 2014-01-24 DIAGNOSIS — I10 Essential (primary) hypertension: Secondary | ICD-10-CM

## 2014-01-24 DIAGNOSIS — M25511 Pain in right shoulder: Secondary | ICD-10-CM

## 2014-01-24 DIAGNOSIS — G809 Cerebral palsy, unspecified: Secondary | ICD-10-CM

## 2014-01-24 HISTORY — DX: Gastro-esophageal reflux disease without esophagitis: K21.9

## 2014-01-24 LAB — COMPLETE METABOLIC PANEL WITH GFR
ALK PHOS: 42 U/L (ref 39–117)
ALT: 16 U/L (ref 0–35)
AST: 18 U/L (ref 0–37)
Albumin: 4.2 g/dL (ref 3.5–5.2)
BUN: 36 mg/dL — AB (ref 6–23)
CO2: 31 mEq/L (ref 19–32)
Calcium: 9.8 mg/dL (ref 8.4–10.5)
Chloride: 104 mEq/L (ref 96–112)
Creat: 1.46 mg/dL — ABNORMAL HIGH (ref 0.50–1.10)
GFR, EST NON AFRICAN AMERICAN: 39 mL/min — AB
GFR, Est African American: 45 mL/min — ABNORMAL LOW
Glucose, Bld: 82 mg/dL (ref 70–99)
POTASSIUM: 4.1 meq/L (ref 3.5–5.3)
SODIUM: 141 meq/L (ref 135–145)
TOTAL PROTEIN: 6.6 g/dL (ref 6.0–8.3)
Total Bilirubin: 0.6 mg/dL (ref 0.2–1.2)

## 2014-01-24 LAB — LIPID PANEL
CHOL/HDL RATIO: 2.8 ratio
CHOLESTEROL: 167 mg/dL (ref 0–200)
HDL: 59 mg/dL (ref >39)
LDL Cholesterol: 89 mg/dL (ref 0–99)
Triglycerides: 96 mg/dL (ref <150)
VLDL: 19 mg/dL (ref 0–40)

## 2014-01-24 MED ORDER — HYDROCODONE-ACETAMINOPHEN 10-325 MG PO TABS: ORAL_TABLET | ORAL | Status: DC

## 2014-01-24 MED ORDER — ATORVASTATIN CALCIUM 10 MG PO TABS: 10.0000 mg | ORAL_TABLET | Freq: Every day | ORAL | Status: DC

## 2014-01-24 MED ORDER — TRIAMTERENE-HCTZ 37.5-25 MG PO TABS: 1.0000 | ORAL_TABLET | Freq: Every day | ORAL | Status: DC

## 2014-01-24 MED ORDER — OMEPRAZOLE 20 MG PO CPDR: 20.0000 mg | DELAYED_RELEASE_CAPSULE | Freq: Every day | ORAL | Status: DC

## 2014-01-24 NOTE — Patient Instructions (Addendum)
Kathy Owens,   Very nice to see you today. Your blood pressure is under control. Congratulations - keep taking your medicines regularly. We will start you on a medicine for acidity and reflux today - It is called OMEPRAZOLE - you take it once a day before meals.  I have given you your refills today.  We have done some blood work today and if I see anything wrong with the results, I will call you.   Thanks, Madilyn Fireman MD MPH 01/24/2014 9:26 AM   Diet for Gastroesophageal Reflux Disease, Adult Reflux is when stomach acid flows up into the esophagus. The esophagus becomes irritated and sore (inflammation). When reflux happens often and is severe, it is called gastroesophageal reflux disease (GERD). What you eat can help ease any discomfort caused by GERD. FOODS OR DRINKS TO AVOID OR LIMIT  Coffee and black tea, with or without caffeine.  Bubbly (carbonated) drinks with caffeine or energy drinks.  Strong spices, such as pepper, cayenne pepper, curry, or chili powder.  Peppermint or spearmint.  Chocolate.  High-fat foods, such as meats, fried food, oils, butter, or nuts.  Fruits and vegetables that cause discomfort. This includes citrus fruits and tomatoes.  Alcohol. If a certain food or drink irritates your GERD, avoid eating or drinking it. THINGS THAT MAY HELP GERD INCLUDE:  Eat meals slowly.  Eat 5 to 6 small meals a day, not 3 large meals.  Do not eat food for a certain amount of time if it causes discomfort.  Wait 3 hours after eating before lying down.  Keep the head of your bed raised 6 to 9 inches (15 23 centimeters). Put a foam wedge or blocks under the legs of the bed.  Stay active. Weight loss, if needed, may help ease your discomfort.  Wear loose-fitting clothing.  Do not smoke or chew tobacco.   Gastroesophageal Reflux Disease, Adult Gastroesophageal reflux disease (GERD) happens when acid from your stomach goes into your food pipe (esophagus). The acid  can cause a burning feeling in your chest. Over time, the acid can make small holes (ulcers) in your food pipe.  HOME CARE  Ask your doctor for advice about:  Losing weight.  Quitting smoking.  Alcohol use.  Avoid foods and drinks that make your problems worse. You may want to avoid:  Caffeine and alcohol.  Chocolate.  Mints.  Garlic and onions.  Spicy foods.  Citrus fruits, such as oranges, lemons, or limes.  Foods that contain tomato, such as sauce, chili, salsa, and pizza.  Fried and fatty foods.  Avoid lying down for 3 hours before you go to bed or before you take a nap.  Eat small meals often, instead of large meals.  Wear loose-fitting clothing. Do not wear anything tight around your waist.  Raise (elevate) the head of your bed 6 to 8 inches with wood blocks. Using extra pillows does not help.  Only take medicines as told by your doctor.  Do not take aspirin or ibuprofen. GET HELP RIGHT AWAY IF:   You have pain in your arms, neck, jaw, teeth, or back.  Your pain gets worse or changes.  You feel sick to your stomach (nauseous), throw up (vomit), or sweat (diaphoresis).  You feel short of breath, or you pass out (faint).  Your throw up is green, yellow, black, or looks like coffee grounds or blood.  Your poop (stool) is red, bloody, or black. MAKE SURE YOU:   Understand these instructions.  Will  watch your condition.  Will get help right away if you are not doing well or get worse. Document Released: 03/24/2008 Document Revised: 12/29/2011 Document Reviewed: 04/25/2011 [facility] Patient Information 2014 [place], [place].

## 2014-01-24 NOTE — Progress Notes (Signed)
Subjective:     Patient ID: Kathy Owens, female   DOB: 15-Aug-1956, 58 y.o.   MRN: [id number]  HPI Kathy Owens follows for hypertension, hyperlipidemia, and hip joint pain. She has cerebral palsy, however manages her ADLs and IADLs pretty well. Her joints pains are well controlled on her current regimen. She wants a handicapped sticker which we are happy to provide to her. She denies any recent falls. She presents with a new complaints of having "acid come up her stomach". It is associated with food intake, and happens at night too when she lies down.  Review of Systems  Respiratory: Negative for cough, chest tightness, shortness of breath and wheezing.   Cardiovascular: Negative for chest pain and palpitations.  Gastrointestinal: Negative for abdominal pain, diarrhea, constipation and abdominal distention.  Musculoskeletal: Positive for arthralgias, back pain, gait problem and myalgias.  Neurological: Negative for syncope and speech difficulty.       Objective:   Physical Exam  Constitutional: She is oriented to person, place, and time.  HENT:  Head: Normocephalic and atraumatic.  Cardiovascular: Normal rate, regular rhythm and normal heart sounds.   No murmur heard. Pulmonary/Chest: Effort normal and breath sounds normal. No respiratory distress. She has no wheezes. She has no rales. She exhibits no tenderness.  Abdominal: Soft.  Neurological: She is alert and oriented to person, place, and time.  Skin: Skin is warm.       Assessment & Plan:      GERD - Risk increased with opiate use - Omeprazole prescribed.    Cerebral palsy - doing well, able to carry ADLs and IADLs. Handicapped sticker given.   Hypertension - Well controlled on Maxzide.  Hyperlipidemia - Lipid profile today.

## 2014-01-25 NOTE — Addendum Note (Signed)
Addended by: Madilyn Fireman on: 01/25/2014 10:09 AM   Modules accepted: Orders

## 2014-01-25 NOTE — Progress Notes (Signed)
  Lab results   Blood work shows increased BUN and Creatinine  Recent Labs Lab 01/24/14 0941  BUN 36*  CREATININE 1.46*   The patient has had increased levels once before in 01/2011 when creatinine was 1.22 Since the last value we have is from last year, I am not sure if this rise is acute or chronic. I called the patient to enquire if she is having any diarrhea or nausea, vomiting and she said she always has somewhat loose stools, which were a little worse these days. She admitted low water intake. She denied nausea-vomiting. I advised the patient to drink 6-8 glasses of water every day. I will follow this and ask the patient to come for a repeat blood work in 1 week.

## 2014-02-02 ENCOUNTER — Other Ambulatory Visit (INDEPENDENT_AMBULATORY_CARE_PROVIDER_SITE_OTHER): Payer: PRIVATE HEALTH INSURANCE

## 2014-02-02 DIAGNOSIS — N289 Disorder of kidney and ureter, unspecified: Secondary | ICD-10-CM

## 2014-02-02 LAB — BASIC METABOLIC PANEL WITH GFR
BUN: 27 mg/dL — AB (ref 6–23)
CALCIUM: 10.2 mg/dL (ref 8.4–10.5)
CO2: 28 mEq/L (ref 19–32)
Chloride: 103 mEq/L (ref 96–112)
Creat: 1.05 mg/dL (ref 0.50–1.10)
GFR, Est African American: 67 mL/min
GFR, Est Non African American: 58 mL/min — ABNORMAL LOW
GLUCOSE: 82 mg/dL (ref 70–99)
Potassium: 3.8 mEq/L (ref 3.5–5.3)
Sodium: 140 mEq/L (ref 135–145)

## 2014-02-13 ENCOUNTER — Telehealth: Payer: Self-pay | Admitting: *Deleted

## 2014-02-13 NOTE — Telephone Encounter (Signed)
Pt called saw foot doctor and pt states suggested to use moleskin or bunion pad on areas on feet. Pt would like Rx so insurance will pay for this. Hilda Blades Christien Berthelot RN 02/13/14 3PM

## 2014-02-20 MED ORDER — BUNION GUARD PADS: MEDICATED_PAD | Status: DC

## 2014-02-23 ENCOUNTER — Ambulatory Visit (INDEPENDENT_AMBULATORY_CARE_PROVIDER_SITE_OTHER): Payer: Medicare Other | Admitting: Podiatry

## 2014-02-23 ENCOUNTER — Encounter: Payer: Self-pay | Admitting: Podiatry

## 2014-02-23 DIAGNOSIS — B351 Tinea unguium: Secondary | ICD-10-CM

## 2014-02-23 DIAGNOSIS — M79609 Pain in unspecified limb: Secondary | ICD-10-CM

## 2014-02-24 NOTE — Progress Notes (Signed)
Subjective:     Patient ID: Kathy Owens, female   DOB: Jan 18, 1956, 58 y.o.   MRN: [id number]  HPI patient presents with dystrophic ingrown nailbeds 1-5 both feet that are thick and painful   Review of Systems     Objective:   Physical Exam Neurovascular status unchanged with thick incurvated nail bed 1-5 both feet that are painful when pressed    Assessment:     Mycotic nail infection with pain 1-5 both feet    Plan:     Debridement of painful nailbeds 1-5 both feet with no iatrogenic bleeding noted

## 2014-03-08 ENCOUNTER — Other Ambulatory Visit: Payer: Self-pay | Admitting: *Deleted

## 2014-03-08 DIAGNOSIS — M25512 Pain in left shoulder: Principal | ICD-10-CM

## 2014-03-08 DIAGNOSIS — M25511 Pain in right shoulder: Secondary | ICD-10-CM

## 2014-03-10 MED ORDER — HYDROCODONE-ACETAMINOPHEN 10-325 MG PO TABS: ORAL_TABLET | ORAL | Status: DC

## 2014-04-04 ENCOUNTER — Other Ambulatory Visit: Payer: Self-pay | Admitting: *Deleted

## 2014-04-04 DIAGNOSIS — M25512 Pain in left shoulder: Principal | ICD-10-CM

## 2014-04-04 DIAGNOSIS — M25511 Pain in right shoulder: Secondary | ICD-10-CM

## 2014-04-06 ENCOUNTER — Other Ambulatory Visit: Payer: Self-pay | Admitting: Internal Medicine

## 2014-04-06 DIAGNOSIS — M25511 Pain in right shoulder: Secondary | ICD-10-CM

## 2014-04-06 DIAGNOSIS — M25512 Pain in left shoulder: Principal | ICD-10-CM

## 2014-04-06 MED ORDER — HYDROCODONE-ACETAMINOPHEN 10-325 MG PO TABS: ORAL_TABLET | ORAL | Status: DC

## 2014-04-06 NOTE — Telephone Encounter (Signed)
Rx ready to be pick up - pt called/informed.

## 2014-04-20 ENCOUNTER — Other Ambulatory Visit: Payer: Self-pay | Admitting: Internal Medicine

## 2014-04-20 DIAGNOSIS — Z1231 Encounter for screening mammogram for malignant neoplasm of breast: Secondary | ICD-10-CM

## 2014-05-01 ENCOUNTER — Ambulatory Visit (INDEPENDENT_AMBULATORY_CARE_PROVIDER_SITE_OTHER): Payer: PRIVATE HEALTH INSURANCE | Admitting: Internal Medicine

## 2014-05-01 ENCOUNTER — Telehealth: Payer: Self-pay | Admitting: *Deleted

## 2014-05-01 ENCOUNTER — Encounter: Payer: Self-pay | Admitting: Internal Medicine

## 2014-05-01 VITALS — BP 128/86 | HR 77 | Temp 97.0°F | Ht 60.0 in | Wt 153.0 lb

## 2014-05-01 DIAGNOSIS — M25511 Pain in right shoulder: Secondary | ICD-10-CM

## 2014-05-01 DIAGNOSIS — H6120 Impacted cerumen, unspecified ear: Secondary | ICD-10-CM | POA: Insufficient documentation

## 2014-05-01 DIAGNOSIS — M25512 Pain in left shoulder: Principal | ICD-10-CM

## 2014-05-01 DIAGNOSIS — H6123 Impacted cerumen, bilateral: Secondary | ICD-10-CM

## 2014-05-01 MED ORDER — ATORVASTATIN CALCIUM 10 MG PO TABS: 10.0000 mg | ORAL_TABLET | Freq: Every day | ORAL | Status: DC

## 2014-05-01 MED ORDER — TRIAMTERENE-HCTZ 37.5-25 MG PO TABS: 1.0000 | ORAL_TABLET | Freq: Every day | ORAL | Status: DC

## 2014-05-01 MED ORDER — CALCIUM CARBONATE-VITAMIN D 500-200 MG-UNIT PO TABS: 1.0000 | ORAL_TABLET | Freq: Two times a day (BID) | ORAL | Status: DC

## 2014-05-01 MED ORDER — CARBAMIDE PEROXIDE 6.5 % OT SOLN: 5.0000 [drp] | Freq: Two times a day (BID) | OTIC | Status: DC

## 2014-05-01 MED ORDER — OMEPRAZOLE 20 MG PO CPDR: 20.0000 mg | DELAYED_RELEASE_CAPSULE | Freq: Every day | ORAL | Status: DC

## 2014-05-01 MED ORDER — HYDROCODONE-ACETAMINOPHEN 10-325 MG PO TABS: ORAL_TABLET | ORAL | Status: DC

## 2014-05-01 MED ORDER — ASPIRIN 81 MG PO TBEC: 81.0000 mg | DELAYED_RELEASE_TABLET | Freq: Every day | ORAL | Status: DC

## 2014-05-01 NOTE — Assessment & Plan Note (Signed)
Moderate to large quantities of cerumen noted in both ears. Suspect her symptoms of "room spinning around" and "muffled voices" secondary to cerumen impaction.   Plans: S/p Irrigation of both ears with immediate improvement in symptoms. TM appear within normal limits. Recommended to follow if symptoms worsen.
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
## 2014-05-01 NOTE — Telephone Encounter (Signed)
Pt called - past week problems with dizziness and both ears hurting. Pt states she is upset about being dizzy. Appt made 05/01/14 2:15PM Dr Eyvonne Mechanic. Hilda Blades Gurpreet Mariani RN 05/01/14 10:15AM

## 2014-05-01 NOTE — Patient Instructions (Signed)
Use the ear drops as recommended in both ears and clean the both ears with a regular q-tip. If your symptoms persist or worsen, please inform us.

## 2014-05-02 NOTE — Progress Notes (Signed)
Case discussed with Dr. Eyvonne Mechanic soon after the resident saw the patient.  We reviewed the resident's history and exam and pertinent patient test results.  I agree with the assessment, diagnosis, and plan of care documented in the resident's note.

## 2014-05-25 ENCOUNTER — Ambulatory Visit: Payer: Medicare Other | Admitting: Podiatry

## 2014-05-31 ENCOUNTER — Ambulatory Visit (HOSPITAL_COMMUNITY)
Admission: RE | Admit: 2014-05-31 | Discharge: 2014-05-31 | Disposition: A | Discharge status: Home / Self Care | Payer: PRIVATE HEALTH INSURANCE | Source: Ambulatory Visit | Attending: Internal Medicine | Admitting: Internal Medicine

## 2014-05-31 ENCOUNTER — Other Ambulatory Visit: Payer: Self-pay | Admitting: *Deleted

## 2014-05-31 DIAGNOSIS — Z1231 Encounter for screening mammogram for malignant neoplasm of breast: Secondary | ICD-10-CM | POA: Diagnosis present

## 2014-05-31 DIAGNOSIS — M25512 Pain in left shoulder: Principal | ICD-10-CM

## 2014-05-31 DIAGNOSIS — M25511 Pain in right shoulder: Secondary | ICD-10-CM

## 2014-06-01 MED ORDER — HYDROCODONE-ACETAMINOPHEN 10-325 MG PO TABS: ORAL_TABLET | ORAL | Status: DC

## 2014-06-01 NOTE — Telephone Encounter (Signed)
Pt informed

## 2014-06-08 ENCOUNTER — Ambulatory Visit (INDEPENDENT_AMBULATORY_CARE_PROVIDER_SITE_OTHER): Payer: Medicare Other | Admitting: Podiatry

## 2014-06-08 DIAGNOSIS — M79673 Pain in unspecified foot: Secondary | ICD-10-CM

## 2014-06-08 DIAGNOSIS — B351 Tinea unguium: Secondary | ICD-10-CM

## 2014-06-08 DIAGNOSIS — M79609 Pain in unspecified limb: Secondary | ICD-10-CM

## 2014-06-08 NOTE — Progress Notes (Signed)
   Subjective:    Patient ID: Kathy Owens, female    DOB: [birth year], 58 y.o.   MRN: [id number]  HPI  Pt presents for nail debridement  Review of Systems     Objective:   Physical Exam        Assessment & Plan:

## 2014-06-11 NOTE — Progress Notes (Signed)
Subjective:     Patient ID: Kathy Owens, female   DOB: 26-Nov-1955, 58 y.o.   MRN: [id number]  HPI patient presents with nail disease and thickness 1-5 both feet that are bothersome   Review of Systems     Objective:   Physical Exam Neurovascular status unchanged with thick yellow brittle nailbeds 1-5 both feet that are painful when pressed    Assessment:     Mycotic nail infection with pain 1-5 both feet    Plan:     Debris painful nailbeds 1-5 both feet with no iatrogenic bleeding noted

## 2014-07-01 ENCOUNTER — Encounter (HOSPITAL_COMMUNITY): Payer: Self-pay | Admitting: Emergency Medicine

## 2014-07-01 ENCOUNTER — Emergency Department (HOSPITAL_COMMUNITY)
Admission: EM | Admit: 2014-07-01 | Discharge: 2014-07-01 | Disposition: A | Discharge status: Home / Self Care | Payer: PRIVATE HEALTH INSURANCE | Attending: Emergency Medicine | Admitting: Emergency Medicine

## 2014-07-01 DIAGNOSIS — Z8659 Personal history of other mental and behavioral disorders: Secondary | ICD-10-CM | POA: Diagnosis not present

## 2014-07-01 DIAGNOSIS — Z7982 Long term (current) use of aspirin: Secondary | ICD-10-CM | POA: Insufficient documentation

## 2014-07-01 DIAGNOSIS — E785 Hyperlipidemia, unspecified: Secondary | ICD-10-CM | POA: Diagnosis not present

## 2014-07-01 DIAGNOSIS — H8110 Benign paroxysmal vertigo, unspecified ear: Secondary | ICD-10-CM | POA: Diagnosis not present

## 2014-07-01 DIAGNOSIS — M8700 Idiopathic aseptic necrosis of unspecified bone: Secondary | ICD-10-CM | POA: Insufficient documentation

## 2014-07-01 DIAGNOSIS — R197 Diarrhea, unspecified: Secondary | ICD-10-CM | POA: Diagnosis not present

## 2014-07-01 DIAGNOSIS — Z79899 Other long term (current) drug therapy: Secondary | ICD-10-CM | POA: Diagnosis not present

## 2014-07-01 DIAGNOSIS — I10 Essential (primary) hypertension: Secondary | ICD-10-CM | POA: Diagnosis not present

## 2014-07-01 DIAGNOSIS — R42 Dizziness and giddiness: Secondary | ICD-10-CM | POA: Diagnosis present

## 2014-07-01 LAB — CBC WITH DIFFERENTIAL/PLATELET
BASOS ABS: 0 10*3/uL (ref 0.0–0.1)
BASOS PCT: 0 % (ref 0–1)
EOS ABS: 0.2 10*3/uL (ref 0.0–0.7)
EOS PCT: 2 % (ref 0–5)
HEMATOCRIT: 36 % (ref 36.0–46.0)
HEMOGLOBIN: 12.3 g/dL (ref 12.0–15.0)
Lymphocytes Relative: 20 % (ref 12–46)
Lymphs Abs: 1.5 10*3/uL (ref 0.7–4.0)
MCH: 29.6 pg (ref 26.0–34.0)
MCHC: 34.2 g/dL (ref 30.0–36.0)
MCV: 86.5 fL (ref 78.0–100.0)
MONOS PCT: 5 % (ref 3–12)
Monocytes Absolute: 0.4 10*3/uL (ref 0.1–1.0)
NEUTROS ABS: 5.6 10*3/uL (ref 1.7–7.7)
Neutrophils Relative %: 73 % (ref 43–77)
Platelets: 187 10*3/uL (ref 150–400)
RBC: 4.16 MIL/uL (ref 3.87–5.11)
RDW: 13.1 % (ref 11.5–15.5)
WBC: 7.7 10*3/uL (ref 4.0–10.5)

## 2014-07-01 LAB — COMPREHENSIVE METABOLIC PANEL
ALBUMIN: 4.2 g/dL (ref 3.5–5.2)
ALT: 25 U/L (ref 0–35)
ANION GAP: 15 (ref 5–15)
AST: 27 U/L (ref 0–37)
Alkaline Phosphatase: 44 U/L (ref 39–117)
BUN: 31 mg/dL — AB (ref 6–23)
CALCIUM: 8.9 mg/dL (ref 8.4–10.5)
CHLORIDE: 102 meq/L (ref 96–112)
CO2: 22 mEq/L (ref 19–32)
CREATININE: 1.19 mg/dL — AB (ref 0.50–1.10)
GFR calc Af Amer: 57 mL/min — ABNORMAL LOW (ref >90)
GFR calc non Af Amer: 49 mL/min — ABNORMAL LOW (ref >90)
Glucose, Bld: 94 mg/dL (ref 70–99)
Potassium: 3.3 mEq/L — ABNORMAL LOW (ref 3.7–5.3)
Sodium: 139 mEq/L (ref 137–147)
Total Bilirubin: 0.5 mg/dL (ref 0.3–1.2)
Total Protein: 6.6 g/dL (ref 6.0–8.3)

## 2014-07-01 LAB — LIPASE, BLOOD: LIPASE: 26 U/L (ref 11–59)

## 2014-07-01 MED ORDER — MECLIZINE HCL 25 MG PO TABS: 25.0000 mg | ORAL_TABLET | Freq: Three times a day (TID) | ORAL | Status: DC | PRN

## 2014-07-01 MED ORDER — DIAZEPAM 5 MG/ML IJ SOLN
2.5000 mg | Freq: Once | INTRAMUSCULAR | Status: AC
  Administered 2014-07-01: 2.5 mg via INTRAVENOUS
  Filled 2014-07-01: qty 2

## 2014-07-01 MED ORDER — MECLIZINE HCL 25 MG PO TABS
25.0000 mg | ORAL_TABLET | Freq: Once | ORAL | Status: AC
  Administered 2014-07-01: 25 mg via ORAL
  Filled 2014-07-01: qty 1

## 2014-07-01 MED ORDER — DIAZEPAM 5 MG/ML IJ SOLN: 5.0000 mg | Freq: Once | INTRAMUSCULAR | Status: DC

## 2014-07-01 MED ORDER — ONDANSETRON HCL 4 MG/2ML IJ SOLN
4.0000 mg | Freq: Once | INTRAMUSCULAR | Status: AC
  Administered 2014-07-01: 4 mg via INTRAVENOUS
  Filled 2014-07-01: qty 2

## 2014-07-01 NOTE — ED Notes (Signed)
Per [facility] - pt from home w/ c/o n/v/d x2 hours - pt also admits to dizziness, "feels like the room is spinning" worse w/ movement, denies dizziness at present, denies HA or blurred vision.
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
## 2014-07-01 NOTE — Discharge Instructions (Signed)
Benign Positional Vertigo Vertigo means you feel like you or your surroundings are moving when they are not. Benign positional vertigo is the most common form of vertigo. Benign means that the cause of your condition is not serious. Benign positional vertigo is more common in older adults. CAUSES  Benign positional vertigo is the result of an upset in the labyrinth system. This is an area in the middle ear that helps control your balance. This may be caused by a viral infection, head injury, or repetitive motion. However, often no specific cause is found. SYMPTOMS  Symptoms of benign positional vertigo occur when you move your head or eyes in different directions. Some of the symptoms may include:  Loss of balance and falls.  Vomiting.  Blurred vision.  Dizziness.  Nausea.  Involuntary eye movements (nystagmus). DIAGNOSIS  Benign positional vertigo is usually diagnosed by physical exam. If the specific cause of your benign positional vertigo is unknown, your caregiver may perform imaging tests, such as magnetic resonance imaging (MRI) or computed tomography (CT). TREATMENT  Your caregiver may recommend movements or procedures to correct the benign positional vertigo. Medicines such as meclizine, benzodiazepines, and medicines for nausea may be used to treat your symptoms. In rare cases, if your symptoms are caused by certain conditions that affect the inner ear, you may need surgery. HOME CARE INSTRUCTIONS   Follow your caregiver's instructions.  Move slowly. Do not make sudden body or head movements.  Avoid driving.  Avoid operating heavy machinery.  Avoid performing any tasks that would be dangerous to you or others during a vertigo episode.  Drink enough fluids to keep your urine clear or pale yellow. SEEK IMMEDIATE MEDICAL CARE IF:   You develop problems with walking, weakness, numbness, or using your arms, hands, or legs.  You have difficulty speaking.  You develop  severe headaches.  Your nausea or vomiting continues or gets worse.  You develop visual changes.  Your family or friends notice any behavioral changes.  Your condition gets worse.  You have a fever.  You develop a stiff neck or sensitivity to light. MAKE SURE YOU:   Understand these instructions.  Will watch your condition.  Will get help right away if you are not doing well or get worse. Document Released: 07/14/2006 Document Revised: 12/29/2011 Document Reviewed: 06/26/2011 [employer] Patient Information 2015 [employer]. This information is not intended to replace advice given to you by your health care provider. Make sure you discuss any questions you have with your health care provider.    

## 2014-07-01 NOTE — ED Notes (Signed)
D/c instructions reviewed w/ pt and family - pt and family deny any further questions or concerns at present. Rx given x1 - pt assisted to vehicle via wheel chair by this RN.

## 2014-07-02 NOTE — ED Provider Notes (Signed)
I saw and evaluated the patient, reviewed the resident's note and I agree with the findings and plan.   EKG Interpretation None      Patient here with rotatory type dizziness, nausea. Patient feeling much better after meclizine, valium. Neuro exam benign, stable for discharge.  Evelina Bucy, MD 07/02/14 [phone]

## 2014-07-06 ENCOUNTER — Encounter: Payer: Self-pay | Admitting: Internal Medicine

## 2014-07-06 ENCOUNTER — Ambulatory Visit (INDEPENDENT_AMBULATORY_CARE_PROVIDER_SITE_OTHER): Payer: PRIVATE HEALTH INSURANCE | Admitting: Internal Medicine

## 2014-07-06 VITALS — BP 134/79 | HR 84 | Temp 97.5°F | Ht 60.0 in | Wt 152.6 lb

## 2014-07-06 DIAGNOSIS — Z23 Encounter for immunization: Secondary | ICD-10-CM

## 2014-07-06 DIAGNOSIS — H8110 Benign paroxysmal vertigo, unspecified ear: Secondary | ICD-10-CM

## 2014-07-06 DIAGNOSIS — M25511 Pain in right shoulder: Secondary | ICD-10-CM

## 2014-07-06 DIAGNOSIS — M25512 Pain in left shoulder: Principal | ICD-10-CM

## 2014-07-06 DIAGNOSIS — I10 Essential (primary) hypertension: Secondary | ICD-10-CM

## 2014-07-06 DIAGNOSIS — R42 Dizziness and giddiness: Secondary | ICD-10-CM

## 2014-07-06 DIAGNOSIS — M25519 Pain in unspecified shoulder: Secondary | ICD-10-CM

## 2014-07-06 DIAGNOSIS — Z299 Encounter for prophylactic measures, unspecified: Secondary | ICD-10-CM

## 2014-07-06 HISTORY — DX: Dizziness and giddiness: R42

## 2014-07-06 LAB — BASIC METABOLIC PANEL
BUN: 24 mg/dL — ABNORMAL HIGH (ref 6–23)
CALCIUM: 10.3 mg/dL (ref 8.4–10.5)
CHLORIDE: 103 meq/L (ref 96–112)
CO2: 25 mEq/L (ref 19–32)
Creat: 1.09 mg/dL (ref 0.50–1.10)
Glucose, Bld: 86 mg/dL (ref 70–99)
Potassium: 4.1 mEq/L (ref 3.5–5.3)
SODIUM: 140 meq/L (ref 135–145)

## 2014-07-06 MED ORDER — MECLIZINE HCL 25 MG PO TABS: 25.0000 mg | ORAL_TABLET | Freq: Three times a day (TID) | ORAL | Status: DC | PRN

## 2014-07-06 MED ORDER — HYDROCODONE-ACETAMINOPHEN 10-325 MG PO TABS: ORAL_TABLET | ORAL | Status: DC

## 2014-07-06 NOTE — Assessment & Plan Note (Signed)
Presented to the Ed with vertigo- described the room as spinning around- 07/01/2014. Diz hallpikes was positive. Was prescribed- meclizine- 25mg  TID, with dramatic improvement. Pt notes no more episodes of Vertigo.   Plan- Meclizine as needed only.

## 2014-07-06 NOTE — Assessment & Plan Note (Addendum)
Flu shot today. Other screening up to date. Pt declined pap smear.

## 2014-07-06 NOTE — Patient Instructions (Addendum)
General Instructions:   We will be sending in a refill of the meclizine, but this will be available for you next month at your [facility].  Also we will do a blood test today to be sure that your potassium is now normal.  Please let us know if you have questions.   Please bring your medicines with you each time you come to clinic.  Medicines may include prescription medications, over-the-counter medications, herbal remedies, eye drops, vitamins, or other pills.

## 2014-07-06 NOTE — Assessment & Plan Note (Signed)
.   BP Readings from Last 3 Encounters:  07/06/14 134/79  07/01/14 125/68  05/01/14 128/86    Lab Results  Component Value Date   NA 139 07/01/2014   K 3.3* 07/01/2014   CREATININE 1.19* 07/01/2014    Assessment: Blood pressure control:  Controlled Progress toward BP goal:   At goal Comments: Complaint with Triamterene-HCTZ- 37.5-25mg  daily  Plan: Medications:  Cont current meds Educational resources provided:   Self management tools provided:   Other plans: Will check BMET today, as K- 3.3, Cr- 1.19, with elevated BUN, most likely from Vomiting- 04/30/2014 which was associated with her dizzy episodes.
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
## 2014-07-07 NOTE — Progress Notes (Signed)
Case discussed with Dr. Emokpae soon after the resident saw the patient. We reviewed the resident's history and exam and pertinent patient test results. I agree with the assessment, diagnosis, and plan of care documented in the resident's note. 

## 2014-08-02 ENCOUNTER — Encounter (HOSPITAL_COMMUNITY): Payer: Self-pay | Admitting: Emergency Medicine

## 2014-08-02 ENCOUNTER — Emergency Department (HOSPITAL_COMMUNITY)
Admission: EM | Admit: 2014-08-02 | Discharge: 2014-08-02 | Disposition: A | Discharge status: Home / Self Care | Payer: PRIVATE HEALTH INSURANCE | Attending: Emergency Medicine | Admitting: Emergency Medicine

## 2014-08-02 DIAGNOSIS — R112 Nausea with vomiting, unspecified: Secondary | ICD-10-CM | POA: Diagnosis not present

## 2014-08-02 DIAGNOSIS — Z79899 Other long term (current) drug therapy: Secondary | ICD-10-CM | POA: Diagnosis not present

## 2014-08-02 DIAGNOSIS — Z7982 Long term (current) use of aspirin: Secondary | ICD-10-CM | POA: Insufficient documentation

## 2014-08-02 DIAGNOSIS — Z62810 Personal history of physical and sexual abuse in childhood: Secondary | ICD-10-CM | POA: Insufficient documentation

## 2014-08-02 DIAGNOSIS — Z791 Long term (current) use of non-steroidal anti-inflammatories (NSAID): Secondary | ICD-10-CM | POA: Diagnosis not present

## 2014-08-02 DIAGNOSIS — R42 Dizziness and giddiness: Secondary | ICD-10-CM | POA: Diagnosis present

## 2014-08-02 DIAGNOSIS — Z8659 Personal history of other mental and behavioral disorders: Secondary | ICD-10-CM | POA: Diagnosis not present

## 2014-08-02 DIAGNOSIS — I10 Essential (primary) hypertension: Secondary | ICD-10-CM | POA: Insufficient documentation

## 2014-08-02 DIAGNOSIS — E785 Hyperlipidemia, unspecified: Secondary | ICD-10-CM | POA: Insufficient documentation

## 2014-08-02 DIAGNOSIS — Z8669 Personal history of other diseases of the nervous system and sense organs: Secondary | ICD-10-CM | POA: Diagnosis not present

## 2014-08-02 MED ORDER — MECLIZINE HCL 25 MG PO TABS
25.0000 mg | ORAL_TABLET | Freq: Once | ORAL | Status: AC
  Administered 2014-08-02: 25 mg via ORAL
  Filled 2014-08-02: qty 1

## 2014-08-02 MED ORDER — DIAZEPAM 5 MG PO TABS
5.0000 mg | ORAL_TABLET | Freq: Once | ORAL | Status: AC
  Administered 2014-08-02: 5 mg via ORAL
  Filled 2014-08-02: qty 1

## 2014-08-02 MED ORDER — ONDANSETRON 4 MG PO TBDP
4.0000 mg | ORAL_TABLET | Freq: Once | ORAL | Status: AC
  Administered 2014-08-02: 4 mg via ORAL
  Filled 2014-08-02: qty 1

## 2014-08-02 NOTE — ED Notes (Signed)
Pt here via EMS from home with c/o vertigo this AM. Pt states that she took her medication, but vomited it back up. Pt states that she took it again, and is now feeling better. Pt has no complaints at this time.

## 2014-08-02 NOTE — ED Notes (Signed)
Pt began vomiting during triage  
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
## 2014-08-02 NOTE — Discharge Instructions (Signed)
Benign Positional Vertigo Vertigo means you feel like you or your surroundings are moving when they are not. Benign positional vertigo is the most common form of vertigo. Benign means that the cause of your condition is not serious. Benign positional vertigo is more common in older adults. CAUSES  Benign positional vertigo is the result of an upset in the labyrinth system. This is an area in the middle ear that helps control your balance. This may be caused by a viral infection, head injury, or repetitive motion. However, often no specific cause is found. SYMPTOMS  Symptoms of benign positional vertigo occur when you move your head or eyes in different directions. Some of the symptoms may include:  Loss of balance and falls.  Vomiting.  Blurred vision.  Dizziness.  Nausea.  Involuntary eye movements (nystagmus). DIAGNOSIS  Benign positional vertigo is usually diagnosed by physical exam. If the specific cause of your benign positional vertigo is unknown, your caregiver may perform imaging tests, such as magnetic resonance imaging (MRI) or computed tomography (CT). TREATMENT  Your caregiver may recommend movements or procedures to correct the benign positional vertigo. Medicines such as meclizine, benzodiazepines, and medicines for nausea may be used to treat your symptoms. In rare cases, if your symptoms are caused by certain conditions that affect the inner ear, you may need surgery. HOME CARE INSTRUCTIONS   Follow your caregiver's instructions.  Move slowly. Do not make sudden body or head movements.  Avoid driving.  Avoid operating heavy machinery.  Avoid performing any tasks that would be dangerous to you or others during a vertigo episode.  Drink enough fluids to keep your urine clear or pale yellow. SEEK IMMEDIATE MEDICAL CARE IF:   You develop problems with walking, weakness, numbness, or using your arms, hands, or legs.  You have difficulty speaking.  You develop  severe headaches.  Your nausea or vomiting continues or gets worse.  You develop visual changes.  Your family or friends notice any behavioral changes.  Your condition gets worse.  You have a fever.  You develop a stiff neck or sensitivity to light. MAKE SURE YOU:   Understand these instructions.  Will watch your condition.  Will get help right away if you are not doing well or get worse. Document Released: 07/14/2006 Document Revised: 12/29/2011 Document Reviewed: 06/26/2011 [employer] Patient Information 2015 [employer]. This information is not intended to replace advice given to you by your health care provider. Make sure you discuss any questions you have with your health care provider.    

## 2014-08-03 ENCOUNTER — Encounter: Payer: Self-pay | Admitting: Internal Medicine

## 2014-08-03 ENCOUNTER — Ambulatory Visit (INDEPENDENT_AMBULATORY_CARE_PROVIDER_SITE_OTHER): Payer: PRIVATE HEALTH INSURANCE | Admitting: Internal Medicine

## 2014-08-03 VITALS — BP 124/73 | HR 75 | Temp 98.6°F | Resp 20 | Ht 58.5 in | Wt 152.7 lb

## 2014-08-03 DIAGNOSIS — M25512 Pain in left shoulder: Secondary | ICD-10-CM

## 2014-08-03 DIAGNOSIS — K219 Gastro-esophageal reflux disease without esophagitis: Secondary | ICD-10-CM

## 2014-08-03 DIAGNOSIS — M8580 Other specified disorders of bone density and structure, unspecified site: Secondary | ICD-10-CM

## 2014-08-03 DIAGNOSIS — E785 Hyperlipidemia, unspecified: Secondary | ICD-10-CM

## 2014-08-03 DIAGNOSIS — I10 Essential (primary) hypertension: Secondary | ICD-10-CM

## 2014-08-03 DIAGNOSIS — H8110 Benign paroxysmal vertigo, unspecified ear: Secondary | ICD-10-CM

## 2014-08-03 DIAGNOSIS — N183 Chronic kidney disease, stage 3 unspecified: Secondary | ICD-10-CM

## 2014-08-03 DIAGNOSIS — M25511 Pain in right shoulder: Secondary | ICD-10-CM

## 2014-08-03 LAB — LIPID PANEL
Cholesterol: 163 mg/dL (ref 0–200)
HDL: 65 mg/dL (ref >39)
LDL Cholesterol: 79 mg/dL (ref 0–99)
Total CHOL/HDL Ratio: 2.5 Ratio
Triglycerides: 97 mg/dL (ref <150)
VLDL: 19 mg/dL (ref 0–40)

## 2014-08-03 LAB — COMPLETE METABOLIC PANEL WITH GFR
ALBUMIN: 4.7 g/dL (ref 3.5–5.2)
ALT: 14 U/L (ref 0–35)
AST: 17 U/L (ref 0–37)
Alkaline Phosphatase: 47 U/L (ref 39–117)
BUN: 25 mg/dL — AB (ref 6–23)
CHLORIDE: 104 meq/L (ref 96–112)
CO2: 26 meq/L (ref 19–32)
Calcium: 10.1 mg/dL (ref 8.4–10.5)
Creat: 1.18 mg/dL — ABNORMAL HIGH (ref 0.50–1.10)
GFR, EST AFRICAN AMERICAN: 58 mL/min — AB
GFR, Est Non African American: 51 mL/min — ABNORMAL LOW
Glucose, Bld: 89 mg/dL (ref 70–99)
Potassium: 3.7 mEq/L (ref 3.5–5.3)
Sodium: 142 mEq/L (ref 135–145)
Total Bilirubin: 0.7 mg/dL (ref 0.2–1.2)
Total Protein: 7 g/dL (ref 6.0–8.3)

## 2014-08-03 MED ORDER — TRIAMTERENE-HCTZ 37.5-25 MG PO TABS: 1.0000 | ORAL_TABLET | Freq: Every day | ORAL | Status: DC

## 2014-08-03 MED ORDER — HYDROCODONE-ACETAMINOPHEN 10-325 MG PO TABS: 1.0000 | ORAL_TABLET | Freq: Four times a day (QID) | ORAL | Status: DC | PRN

## 2014-08-03 MED ORDER — MECLIZINE HCL 25 MG PO TABS: 25.0000 mg | ORAL_TABLET | Freq: Three times a day (TID) | ORAL | Status: DC | PRN

## 2014-08-03 MED ORDER — ATORVASTATIN CALCIUM 10 MG PO TABS: 10.0000 mg | ORAL_TABLET | Freq: Every day | ORAL | Status: DC

## 2014-08-03 MED ORDER — OMEPRAZOLE 20 MG PO CPDR
20.0000 mg | DELAYED_RELEASE_CAPSULE | Freq: Every day | ORAL | Status: DC
Start: 2014-08-03 — End: 2014-08-03

## 2014-08-03 MED ORDER — OMEPRAZOLE 20 MG PO CPDR: 20.0000 mg | DELAYED_RELEASE_CAPSULE | Freq: Every day | ORAL | Status: DC
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
## 2014-08-03 NOTE — Patient Instructions (Signed)
-  Will refer you to neuro-rehab for your vertigo, keep taking meclizine as needed  -Will schedule you for a DEXA scan on Tuesday October 20 at 2:15, please do not take calcium/vitamin D the day before  -I have refilled your medications and will check your blood work today -Pleasure meeting you, will see you back as needed    General Instructions:   Please bring your medicines with you each time you come to clinic.  Medicines may include prescription medications, over-the-counter medications, herbal remedies, eye drops, vitamins, or other pills.   Progress Toward Treatment Goals:  Treatment Goal 05/03/2013  Blood pressure at goal    Self Care Goals & Plans:  Self Care Goal 08/03/2014  Manage my medications take my medicines as prescribed; bring my medications to every visit  Eat healthy foods (No Data)  Be physically active find an activity I enjoy; take a walk every day    No flowsheet data found.   Care Management & Community Referrals:  Referral 05/03/2013  Referrals made for care management support none needed

## 2014-08-04 DIAGNOSIS — N183 Chronic kidney disease, stage 3 unspecified: Secondary | ICD-10-CM | POA: Insufficient documentation

## 2014-08-04 LAB — VITAMIN D 25 HYDROXY (VIT D DEFICIENCY, FRACTURES): Vit D, 25-Hydroxy: 46 ng/mL (ref 30–89)

## 2014-08-04 MED ORDER — ONDANSETRON HCL 4 MG PO TABS: 4.0000 mg | ORAL_TABLET | Freq: Three times a day (TID) | ORAL | Status: DC | PRN

## 2014-08-04 NOTE — Assessment & Plan Note (Signed)
Assessment: Pt with chronic bilateral shoulder pain worse in right with probable rotator cuff disease who presents with controlled pain on current narcotic therapy.   Plan:  -Refill monthly hydrocodone-acetaminophen 10-325 mg Q 6 hr PRN pain

## 2014-08-04 NOTE — Assessment & Plan Note (Addendum)
Assessment: Pt with well-controlled hypertension compliant with one-class anti-hypertensive therapy who presents with blood pressure of 124/73.   Plan:  -BP 124/73 at goal <140/90 -Refill triamterene-HCTZ 37.5-25 mg daily  -Obtain CMP --> stable CKD stage 3, consider adding ACEi therapy to anti-hypertensive therapy

## 2014-08-04 NOTE — Assessment & Plan Note (Signed)
Assessment: Pt with history of osteopenia previously on bisphosphate therapy who presents with no recent fall or fracture.   Plan:  -Obtain 25-OH vitamin D level  -Obtain DEXA scan to assess for osteoporosis -Consider obtaining PTH level in setting of CKD Stage 3   -Continue oscal 500-200 mg BID

## 2014-08-04 NOTE — Assessment & Plan Note (Signed)
Assessment: Pt with normal last lipid panel on 01/24/14 compliant with moderate intensity statin therapy with 10-yr risk of 2.8% and lifetime risk of 39% not in a statin benefit group due to 10-yr risk <5%.    Plan:   -Obtain lipid panel ---> normal with LDL 79  -Refill atorvastatin 10 mg daily, PCP to determine if would benefit from continuation of therapy considering low risk  -Obtain CMP ---> normal liver function -Monitor for myalgias

## 2014-08-04 NOTE — Assessment & Plan Note (Signed)
Assessment: Pt with well-controlled acid reflux disease compliant with PPI therapy who presents with no alarm symptoms.   Plan:  -Refill omeprazole 20 mg daily  -Monitor for alarm symptoms warranting EGD

## 2014-08-04 NOTE — Progress Notes (Signed)
Case discussed with Dr. Naaman Plummer soon after the resident saw the patient.  We reviewed the resident's history and exam and pertinent patient test results.  I agree with the assessment, diagnosis and plan of care documented in the resident's note.  I would like to see Dr. Naaman Plummer institute her ideas into practice with regards to evaluation and management of her stage 3 chronic kidney disease at the follow-up visit as she outlined in her note.

## 2014-08-04 NOTE — Assessment & Plan Note (Addendum)
Assessment: Pt with CKD stage 3 who presents with stable renal function.   Plan:  -Obtain CMP ---> Cr baseline appears to be 1.1 with GFR 51 -Consider obtaining PTH level to assess for secondary hyperparathyroidism  -Consider obtaining UA to assess for protienuria  -Consider adding ACEi therapy to anti-hypertensive therapy  -Avoid nephrotoxins

## 2014-08-04 NOTE — Assessment & Plan Note (Signed)
Assessment: Pt with peripheral vertigo due to recurrent BPPV who presents with resolved symptoms after anti-nausea and meclizine therapy.   Plan:  -Refer to neuro rehab for vestibular rehab -Refill meclizine 25 mg TID PRN vertigo -Prescribe ondansetron 4 mg Q 8 PRN nausea

## 2014-08-08 ENCOUNTER — Ambulatory Visit (HOSPITAL_COMMUNITY)
Admission: RE | Admit: 2014-08-08 | Discharge: 2014-08-08 | Disposition: A | Discharge status: Home / Self Care | Payer: PRIVATE HEALTH INSURANCE | Source: Ambulatory Visit | Attending: Internal Medicine | Admitting: Internal Medicine

## 2014-08-08 DIAGNOSIS — Z780 Asymptomatic menopausal state: Secondary | ICD-10-CM | POA: Diagnosis not present

## 2014-08-08 DIAGNOSIS — Z13820 Encounter for screening for osteoporosis: Secondary | ICD-10-CM | POA: Diagnosis not present

## 2014-08-08 DIAGNOSIS — M8580 Other specified disorders of bone density and structure, unspecified site: Secondary | ICD-10-CM

## 2014-08-10 ENCOUNTER — Ambulatory Visit: Payer: PRIVATE HEALTH INSURANCE | Admitting: Internal Medicine

## 2014-08-11 ENCOUNTER — Encounter: Payer: Self-pay | Admitting: Internal Medicine

## 2014-08-14 ENCOUNTER — Encounter: Payer: Self-pay | Admitting: Internal Medicine

## 2014-08-14 ENCOUNTER — Ambulatory Visit (INDEPENDENT_AMBULATORY_CARE_PROVIDER_SITE_OTHER): Payer: PRIVATE HEALTH INSURANCE | Admitting: Internal Medicine

## 2014-08-14 VITALS — BP 127/83 | HR 76 | Temp 97.8°F | Ht 60.0 in

## 2014-08-14 DIAGNOSIS — Z008 Encounter for other general examination: Secondary | ICD-10-CM

## 2014-08-14 DIAGNOSIS — N183 Chronic kidney disease, stage 3 unspecified: Secondary | ICD-10-CM

## 2014-08-14 DIAGNOSIS — M810 Age-related osteoporosis without current pathological fracture: Secondary | ICD-10-CM

## 2014-08-14 DIAGNOSIS — I10 Essential (primary) hypertension: Secondary | ICD-10-CM

## 2014-08-14 DIAGNOSIS — Z299 Encounter for prophylactic measures, unspecified: Secondary | ICD-10-CM
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
## 2014-08-14 NOTE — Patient Instructions (Addendum)
Call us [phone] when you have decided if you want to start this medication again.  Next available with pcp Below is the reading material you have requested  Alendronate tablets What is this medicine? ALENDRONATE (a LEN droe nate) slows calcium loss from bones. It helps to make normal healthy bone and to slow bone loss in people with Paget's disease and osteoporosis. It may be used in others at risk for bone loss. This medicine may be used for other purposes; ask your health care provider or pharmacist if you have questions. COMMON BRAND NAME(S): Fosamax What should I tell my health care provider before I take this medicine? They need to know if you have any of these conditions: -dental disease -esophagus, stomach, or intestine problems, like acid reflux or GERD -kidney disease -low blood calcium -low vitamin D -problems sitting or standing 30 minutes -trouble swallowing -an unusual or allergic reaction to alendronate, other medicines, foods, dyes, or preservatives -pregnant or trying to get pregnant -breast-feeding How should I use this medicine? You must take this medicine exactly as directed or you will lower the amount of the medicine you absorb into your body or you may cause yourself harm. Take this medicine by mouth first thing in the morning, after you are up for the day. Do not eat or drink anything before you take your medicine. Swallow the tablet with a full glass (6 to 8 fluid ounces) of plain water. Do not take this medicine with any other drink. Do not chew or crush the tablet. After taking this medicine, do not eat breakfast, drink, or take any medicines or vitamins for at least 30 minutes. Sit or stand up for at least 30 minutes after you take this medicine; do not lie down. Do not take your medicine more often than directed. Talk to your pediatrician regarding the use of this medicine in children. Special care may be needed. Overdosage: If you think you have taken too  much of this medicine contact a poison control center or emergency room at once. NOTE: This medicine is only for you. Do not share this medicine with others. What if I miss a dose? If you miss a dose, do not take it later in the day. Continue your normal schedule starting the next morning. Do not take double or extra doses. What may interact with this medicine? -aluminum hydroxide -antacids -aspirin -calcium supplements -drugs for inflammation like ibuprofen, naproxen, and others -iron supplements -magnesium supplements -vitamins with minerals This list may not describe all possible interactions. Give your health care provider a list of all the medicines, herbs, non-prescription drugs, or dietary supplements you use. Also tell them if you smoke, drink alcohol, or use illegal drugs. Some items may interact with your medicine. What should I watch for while using this medicine? Visit your doctor or health care professional for regular checks ups. It may be some time before you see benefit from this medicine. Do not stop taking your medicine except on your doctor's advice. Your doctor or health care professional may order blood tests and other tests to see how you are doing. You should make sure you get enough calcium and vitamin D while you are taking this medicine, unless your doctor tells you not to. Discuss the foods you eat and the vitamins you take with your health care professional. Some people who take this medicine have severe bone, joint, and/or muscle pain. This medicine may also increase your risk for a broken thigh bone. Tell your doctor  right away if you have pain in your upper leg or groin. Tell your doctor if you have any pain that does not go away or that gets worse. This medicine can make you more sensitive to the sun. If you get a rash while taking this medicine, sunlight may cause the rash to get worse. Keep out of the sun. If you cannot avoid being in the sun, wear protective  clothing and use sunscreen. Do not use sun lamps or tanning beds/booths. What side effects may I notice from receiving this medicine? Side effects that you should report to your doctor or health care professional as soon as possible: -allergic reactions like skin rash, itching or hives, swelling of the face, lips, or tongue -black or tarry stools -bone, muscle or joint pain -changes in vision -chest pain -heartburn or stomach pain -jaw pain, especially after dental work -pain or trouble when swallowing -redness, blistering, peeling or loosening of the skin, including inside the mouth Side effects that usually do not require medical attention (report to your doctor or health care professional if they continue or are bothersome): -changes in taste -diarrhea or constipation -eye pain or itching -headache -nausea or vomiting -stomach gas or fullness This list may not describe all possible side effects. Call your doctor for medical advice about side effects. You may report side effects to FDA at 1-800-FDA-1088. Where should I keep my medicine? Keep out of the reach of children. Store at room temperature of 15 and 30 degrees C (59 and 86 degrees F). Throw away any unused medicine after the expiration date. NOTE: This sheet is a summary. It may not cover all possible information. If you have questions about this medicine, talk to your doctor, pharmacist, or health care provider.  2015, Elsevier/Gold Standard. (2011-04-04 08:56:09)  Chronic Kidney Disease Chronic kidney disease occurs when the kidneys are damaged over a long period. The kidneys are two organs that lie on either side of the spine between the middle of the back and the front of the abdomen. The kidneys:   Remove wastes and extra water from the blood.   Produce important hormones. These help keep bones strong, regulate blood pressure, and help create red blood cells.   Balance the fluids and chemicals in the blood and  tissues. A small amount of kidney damage may not cause problems, but a large amount of damage may make it difficult or impossible for the kidneys to work the way they should. If steps are not taken to slow down the kidney damage or stop it from getting worse, the kidneys may stop working permanently. Most of the time, chronic kidney disease does not go away. However, it can often be controlled, and those with the disease can usually live normal lives. CAUSES  The most common causes of chronic kidney disease are diabetes and high blood pressure (hypertension). Chronic kidney disease may also be caused by:   Diseases that cause the kidneys' filters to become inflamed.   Diseases that affect the immune system.   Genetic diseases.   Medicines that damage the kidneys, such as anti-inflammatory medicines.  Poisoning or exposure to toxic substances.   A reoccurring kidney or urinary infection.   A problem with urine flow. This may be caused by:   Cancer.   Kidney stones.   An enlarged prostate in males. SIGNS AND SYMPTOMS  Because the kidney damage in chronic kidney disease occurs slowly, symptoms develop slowly and may not be obvious until the kidney damage  becomes severe. A person may have a kidney disease for years without showing any symptoms. Symptoms can include:   Swelling (edema) of the legs, ankles, or feet.   Tiredness (lethargy).   Nausea or vomiting.   Confusion.   Problems with urination, such as:   Decreased urine production.   Frequent urination, especially at night.   Frequent accidents in children who are potty trained.   Muscle twitches and cramps.   Shortness of breath.  Weakness.   Persistent itchiness.   Loss of appetite.  Metallic taste in the mouth.  Trouble sleeping.  Slowed development in children.  Short stature in children. DIAGNOSIS  Chronic kidney disease may be detected and diagnosed by tests, including blood,  urine, imaging, or kidney biopsy tests.  TREATMENT  Most chronic kidney diseases cannot be cured. Treatment usually involves relieving symptoms and preventing or slowing the progression of the disease. Treatment may include:   A special diet. You may need to avoid alcohol and foods thatare salty and high in potassium.   Medicines. These may:   Lower blood pressure.   Relieve anemia.   Relieve swelling.   Protect the bones. HOME CARE INSTRUCTIONS   Follow your prescribed diet.   Take medicines only as directed by your health care provider. Do not take any new medicines (prescription, over-the-counter, or nutritional supplements) unless approved by your health care provider. Many medicines can worsen your kidney damage or need to have the dose adjusted.   Quit smoking if you smoke. Talk to your health care provider about a smoking cessation program.   Keep all follow-up visits as directed by your health care provider. SEEK IMMEDIATE MEDICAL CARE IF:  Your symptoms get worse or you develop new symptoms.   You develop symptoms of end-stage kidney disease. These include:   Headaches.   Abnormally dark or light skin.   Numbness in the hands or feet.   Easy bruising.   Frequent hiccups.   Menstruation stops.   You have a fever.   You have decreased urine production.   You havepain or bleeding when urinating. MAKE SURE YOU:  Understand these instructions.  Will watch your condition.  Will get help right away if you are not doing well or get worse. FOR MORE INFORMATION   American Association of Kidney Patients: BombTimer.gl  National Kidney Foundation: www.kidney.[place]: https://mathis.com/  [facility]: www.lifeoptions.org and www.kidneyschool.org Document Released: 07/15/2008 Document Revised: 02/20/2014 Document Reviewed: 06/04/2012 [facility] Patient Information 2015 [place], [place]. This information is  not intended to replace advice given to you by your health care provider. Make sure you discuss any questions you have with your health care provider.

## 2014-08-15 LAB — URINALYSIS, COMPLETE
Bacteria, UA: NONE SEEN
Bilirubin Urine: NEGATIVE
Casts: NONE SEEN
Crystals: NONE SEEN
Glucose, UA: NEGATIVE mg/dL
Hgb urine dipstick: NEGATIVE
Ketones, ur: NEGATIVE mg/dL
LEUKOCYTES UA: NEGATIVE
NITRITE: NEGATIVE
PROTEIN: NEGATIVE mg/dL
SPECIFIC GRAVITY, URINE: 1.015 (ref 1.005–1.030)
Squamous Epithelial / LPF: NONE SEEN
Urobilinogen, UA: 0.2 mg/dL (ref 0.0–1.0)
pH: 6 (ref 5.0–8.0)

## 2014-08-15 LAB — PTH, INTACT AND CALCIUM
CALCIUM: 10.3 mg/dL (ref 8.4–10.5)
PTH: 24 pg/mL (ref 14–64)

## 2014-08-15 NOTE — Assessment & Plan Note (Addendum)
Per most recent dexa 07/2014.   We reviewed restarted alendronate today and possible side effects. She prefers qweekly dose. She does endorse some diffuse body pain with the medicine in the past but says otherwise she did okay with it she thinks.  -provided more literature of alendronate for her to review and then when she decides whether to restart, asked to just call and let us know -continue calcium-vit D supplementation -uses walker when walking -checking PTH today in setting of CKD3 -vitamin d 25 hydroxy--46 -if she decides to restart, would recommend 70mg  q weekly, advised to stay upright after taking medication for at least 30 minutes. Will need to monitor renal function closely after stating bisphosphonate.

## 2014-08-15 NOTE — Assessment & Plan Note (Signed)
BP Readings from Last 3 Encounters:  08/14/14 127/83  08/03/14 124/73  08/02/14 114/67   Lab Results  Component Value Date   NA 142 08/03/2014   K 3.7 08/03/2014   CREATININE 1.18* 08/03/2014   Assessment: Blood pressure control:  Controlled Progress toward BP goal:   at goal Comments: did not bring medications today  Plan: Medications:  continue current medications maxzide 37.5-25mg  daily

## 2014-08-15 NOTE — Assessment & Plan Note (Signed)
papsmear on next visit if she agrees (declined in September)

## 2014-08-15 NOTE — Progress Notes (Signed)
Case discussed with Dr. Qureshi soon after the resident saw the patient.  We reviewed the resident's history and exam and pertinent patient test results.  I agree with the assessment, diagnosis, and plan of care documented in the resident's note. 

## 2014-08-15 NOTE — Assessment & Plan Note (Signed)
She was not aware of this diagnosis. I informed her in detail today and reviewed CKD in general. Also provided reading material for CKD.   -u/a today -check PTH -BP well controlled on HCTZ

## 2014-08-24 ENCOUNTER — Telehealth: Payer: Self-pay | Admitting: *Deleted

## 2014-08-24 NOTE — Telephone Encounter (Signed)
Pt calls and states her vertigo is worse and the medicine is not working, triage has called her ph 3 times and it is busy, will continue, does she need an appt? Please advise

## 2014-08-24 NOTE — Telephone Encounter (Signed)
Called pt and relayed Dr. Ellwood Dense advice to pt, she does not want to see ENT at this time and does not want to have MRI as he suggested. She does want to try the therapy for vertigo and will wait to go to that appt. She does state she wants a stronger medicine called in and is informed that she can not have that done without evaluation, it is again suggested that she call the ENT and arrange appt or either have the MRI, she refuses again and states she would like to be seen in clinic, appt is given for fri 11/6 at [place] dr Eula Fried

## 2014-08-24 NOTE — Telephone Encounter (Signed)
We are happy to see her. She was last seen by Dr Naaman Plummer on 10/15 and offered vestibular rehab. She has not yet started that, her first appointment stands on 11/18. We can try to move it up.  She was also seen by [facility] on 08/15/14 and imaging work up was initiated. Would she like to follow up with them instead? If she is having uncontrollable nausea vomiting, dizziness and feeling of passing out, palpitations, headache or earache, she should promptly visit the ER or call 911.

## 2014-08-25 ENCOUNTER — Encounter: Payer: PRIVATE HEALTH INSURANCE | Admitting: Internal Medicine

## 2014-08-25 ENCOUNTER — Encounter: Payer: Self-pay | Admitting: Internal Medicine

## 2014-08-25 ENCOUNTER — Ambulatory Visit (INDEPENDENT_AMBULATORY_CARE_PROVIDER_SITE_OTHER): Payer: PRIVATE HEALTH INSURANCE | Admitting: Internal Medicine

## 2014-08-25 VITALS — BP 127/70 | HR 73 | Temp 98.0°F | Ht 60.0 in | Wt 150.6 lb

## 2014-08-25 DIAGNOSIS — R112 Nausea with vomiting, unspecified: Secondary | ICD-10-CM

## 2014-08-25 DIAGNOSIS — H8110 Benign paroxysmal vertigo, unspecified ear: Secondary | ICD-10-CM

## 2014-08-25 LAB — CBC WITH DIFFERENTIAL/PLATELET
BASOS PCT: 0 % (ref 0–1)
Basophils Absolute: 0 10*3/uL (ref 0.0–0.1)
EOS ABS: 0.2 10*3/uL (ref 0.0–0.7)
Eosinophils Relative: 3 % (ref 0–5)
HCT: 37.4 % (ref 36.0–46.0)
Hemoglobin: 12.7 g/dL (ref 12.0–15.0)
Lymphocytes Relative: 30 % (ref 12–46)
Lymphs Abs: 1.8 10*3/uL (ref 0.7–4.0)
MCH: 29.1 pg (ref 26.0–34.0)
MCHC: 34 g/dL (ref 30.0–36.0)
MCV: 85.8 fL (ref 78.0–100.0)
MONO ABS: 0.4 10*3/uL (ref 0.1–1.0)
Monocytes Relative: 6 % (ref 3–12)
NEUTROS PCT: 61 % (ref 43–77)
Neutro Abs: 3.7 10*3/uL (ref 1.7–7.7)
Platelets: 237 10*3/uL (ref 150–400)
RBC: 4.36 MIL/uL (ref 3.87–5.11)
RDW: 14.4 % (ref 11.5–15.5)
WBC: 6.1 10*3/uL (ref 4.0–10.5)

## 2014-08-25 LAB — COMPLETE METABOLIC PANEL WITH GFR
ALBUMIN: 4.3 g/dL (ref 3.5–5.2)
ALT: 19 U/L (ref 0–35)
AST: 21 U/L (ref 0–37)
Alkaline Phosphatase: 40 U/L (ref 39–117)
BILIRUBIN TOTAL: 0.6 mg/dL (ref 0.2–1.2)
BUN: 19 mg/dL (ref 6–23)
CO2: 23 meq/L (ref 19–32)
Calcium: 9.7 mg/dL (ref 8.4–10.5)
Chloride: 104 mEq/L (ref 96–112)
Creat: 1.22 mg/dL — ABNORMAL HIGH (ref 0.50–1.10)
GFR, EST AFRICAN AMERICAN: 56 mL/min — AB
GFR, Est Non African American: 49 mL/min — ABNORMAL LOW
Glucose, Bld: 88 mg/dL (ref 70–99)
POTASSIUM: 3.7 meq/L (ref 3.5–5.3)
SODIUM: 138 meq/L (ref 135–145)
TOTAL PROTEIN: 6.7 g/dL (ref 6.0–8.3)

## 2014-08-25 MED ORDER — DIAZEPAM 2 MG PO TABS: 2.0000 mg | ORAL_TABLET | Freq: Every day | ORAL | Status: DC | PRN

## 2014-08-25 MED ORDER — DIPHENHYDRAMINE HCL 25 MG PO CAPS: 25.0000 mg | ORAL_CAPSULE | Freq: Four times a day (QID) | ORAL | Status: DC | PRN

## 2014-08-25 MED ORDER — DIAZEPAM 2 MG PO TABS: 2.0000 mg | ORAL_TABLET | Freq: Four times a day (QID) | ORAL | Status: DC | PRN

## 2014-08-25 NOTE — Telephone Encounter (Signed)
Kathy Owens, today is my continuity clinic day, and I already had a patient scheduled to see me at 3:15pm who takes a good amount of time as is.   As such, I have spoken with the front desk to have her rescheduled with Dr. Aundra Dubin. Please consult with them first as they have a list of the patients who require longer visits.  Thank you.

## 2014-08-25 NOTE — Patient Instructions (Addendum)
General Instructions: Stop Meclizine Try Benadryl 25-50 mg every 6 six hours  1st  If Benadryl not working try Valium 2.5 mg Follow up with Dr. Erik Obey and get the MRI.   Continue the Zofran for nausea  Follow up in 1-2 months sooner if needed for dizziness      Please bring your medicines with you each time you come to clinic.  Medicines may include prescription medications, over-the-counter medications, herbal remedies, eye drops, vitamins, or other pills.   Progress Toward Treatment Goals:  Treatment Goal 08/25/2014  Blood pressure at goal    Self Care Goals & Plans:  Self Care Goal 08/25/2014  Manage my medications take my medicines as prescribed; bring my medications to every visit; refill my medications on time; follow the sick day instructions if I am sick  Monitor my health keep track of my blood pressure  Eat healthy foods drink diet soda or water instead of juice or soda; eat more vegetables; eat foods that are low in salt; eat baked foods instead of fried foods; eat fruit for snacks and desserts; eat smaller portions  Be physically active -  Meeting treatment goals maintain the current self-care plan    No flowsheet data found.   Care Management & Community Referrals:  Referral 08/25/2014  Referrals made for care management support -  Referrals made to community resources none        Treatment Goals:  Goals (1 Years of Data) as of 08/25/14    None      Progress Toward Treatment Goals:  Treatment Goal 08/25/2014  Blood pressure at goal    Self Care Goals & Plans:  Self Care Goal 08/25/2014  Manage my medications take my medicines as prescribed; bring my medications to every visit; refill my medications on time; follow the sick day instructions if I am sick  Monitor my health keep track of my blood pressure  Eat healthy foods drink diet soda or water instead of juice or soda; eat more vegetables; eat foods that are low in salt; eat baked foods instead  of fried foods; eat fruit for snacks and desserts; eat smaller portions  Be physically active -  Meeting treatment goals maintain the current self-care plan    No flowsheet data found.   Care Management & Community Referrals:  Referral 08/25/2014  Referrals made for care management support -  Referrals made to community resources none      Dizziness Dizziness is a common problem. It is a feeling of unsteadiness or light-headedness. You may feel like you are about to faint. Dizziness can lead to injury if you stumble or fall. A person of any age group can suffer from dizziness, but dizziness is more common in older adults. CAUSES  Dizziness can be caused by many different things, including:  Middle ear problems.  Standing for too long.  Infections.  An allergic reaction.  Aging.  An emotional response to something, such as the sight of blood.  Side effects of medicines.  Tiredness.  Problems with circulation or blood pressure.  Excessive use of alcohol or medicines, or illegal drug use.  Breathing too fast (hyperventilation).  An irregular heart rhythm (arrhythmia).  A low red blood cell count (anemia).  Pregnancy.  Vomiting, diarrhea, fever, or other illnesses that cause body fluid loss (dehydration).  Diseases or conditions such as Parkinson's disease, high blood pressure (hypertension), diabetes, and thyroid problems.  Exposure to extreme heat. DIAGNOSIS  Your health care provider will ask about  your symptoms, perform a physical exam, and perform an electrocardiogram (ECG) to record the electrical activity of your heart. Your health care provider may also perform other heart or blood tests to determine the cause of your dizziness. These may include:  Transthoracic echocardiogram (TTE). During echocardiography, sound waves are used to evaluate how blood flows through your heart.  Transesophageal echocardiogram (TEE).  Cardiac monitoring. This allows your  health care provider to monitor your heart rate and rhythm in real time.  Holter monitor. This is a portable device that records your heartbeat and can help diagnose heart arrhythmias. It allows your health care provider to track your heart activity for several days if needed.  Stress tests by exercise or by giving medicine that makes the heart beat faster. TREATMENT  Treatment of dizziness depends on the cause of your symptoms and can vary greatly. HOME CARE INSTRUCTIONS   Drink enough fluids to keep your urine clear or pale yellow. This is especially important in very hot weather. In older adults, it is also important in cold weather.  Take your medicine exactly as directed if your dizziness is caused by medicines. When taking blood pressure medicines, it is especially important to get up slowly.  Rise slowly from chairs and steady yourself until you feel okay.  In the morning, first sit up on the side of the bed. When you feel okay, stand slowly while holding onto something until you know your balance is fine.  Move your legs often if you need to stand in one place for a long time. Tighten and relax your muscles in your legs while standing.  Have someone stay with you for 1-2 days if dizziness continues to be a problem. Do this until you feel you are well enough to stay alone. Have the person call your health care provider if he or she notices changes in you that are concerning.  Do not drive or use heavy machinery if you feel dizzy.  Do not drink alcohol. SEEK IMMEDIATE MEDICAL CARE IF:   Your dizziness or light-headedness gets worse.  You feel nauseous or vomit.  You have problems talking, walking, or using your arms, hands, or legs.  You feel weak.  You are not thinking clearly or you have trouble forming sentences. It may take a friend or family member to notice this.  You have chest pain, abdominal pain, shortness of breath, or sweating.  Your vision changes.  You  notice any bleeding.  You have side effects from medicine that seems to be getting worse rather than better. MAKE SURE YOU:   Understand these instructions.  Will watch your condition.  Will get help right away if you are not doing well or get worse. Document Released: 04/01/2001 Document Revised: 10/11/2013 Document Reviewed: 04/25/2011 [facility] Patient Information 2015 [place], [place]. This information is not intended to replace advice given to you by your health care provider. Make sure you discuss any questions you have with your health care provider.

## 2014-08-25 NOTE — Telephone Encounter (Signed)
appt changed to 1515, pt aware, dr patel

## 2014-08-28 ENCOUNTER — Encounter: Payer: Self-pay | Admitting: Internal Medicine

## 2014-08-28 NOTE — Assessment & Plan Note (Addendum)
Still having intermittent episodes  Will d/c Meclizine try Benadryl 25 mg q4-6 hours prn (pt to try 1st and reserve use of Valium until if no relief), Valium 2 mg daily prn #5 no refills (return to ED or clinic if not improving with Valium use), advised pt to take Zofran as soon as she feels nauseated  Will check CMET, CBC with sx's of diarrhea  Pending insurance approval for MRI Pt sch vestibular rehab 09/06/14  Prn f/u with ENT Dr. Erik Obey  F/u [facility] 1-2 months prn.   Awaiting fax of records from 45/80/99 appt with Dr.Wolicki

## 2014-08-28 NOTE — Assessment & Plan Note (Signed)
Given Rx refill of narcotic from Dr. Ellwood Dense already signed previously

## 2014-08-28 NOTE — Progress Notes (Signed)
   Subjective:    Patient ID: Kathy Owens, female    DOB: 1956/10/13, 58 y.o.   MRN: [id number]  HPI Comments: 58 y.o PMH cerebal palsy, HTN, osteoporosis, chronic pain, CKD  She presents for f/u for:  1. Vertigo-She states Meclizine 25 mg tid prn is not helping.  She has only tried the Meclizine 1-2 x in one day though.  She has had total 3 episodes of vertigo in 6 weeks where she feels dizzy with the room spinning, experiences nausea, vomiting, diarrhea (brown color happens 1x per month or every 6 weeks), and sensation that she is going to fall to the left.  She denies h/a, weakness during episodes.  She is unsure if she has ringing in her ears.  Last episode was Wednesday before her visit today where her episode lasted 3 hours where all she could do was lie in bed.  Her sister is concerned and at the visit today b/c the patient lives alone and her sister works and cant always help her when she has the vertigo.  She has previously gone to the ED and was given Valium 2.5 mg x1 which helped better than Meclizine.  She also tried Zofran for nausea which she vomited up after taking.  She saw Dr. Erik Obey (ENT) in 63/33/[id number] who stated the patient has decreased hearing on the left.  Reviewed his note via his RN over the phone who stated he also wanted the patient to get an MRI to r/o tumor (but MRI is pending insurance approval) and stop Meclizine.  Patient was initially hesistant to get MRI b/c she is afraid to be in closed spaces but she is agreeable today.  The patient has vestibular rehab scheduled on 09/06/14.    2. She asks for Rx refill of chronic pain medication-given already printed copy from Dr. Ellwood Dense.    ROS see above  SH: lives alone, goes to church at [place]      Review of Systems  Gastrointestinal: Negative for abdominal pain.       Objective:   Physical Exam  Constitutional: She is oriented to person, place, and time. Vital signs are normal. She appears well-developed and  well-nourished. She is cooperative. No distress.  Very pleasant   HENT:  Head: Normocephalic and atraumatic.  Mouth/Throat: No oropharyngeal exudate.  Eyes: Conjunctivae are normal. Pupils are equal, round, and reactive to light. Right eye exhibits no discharge. Left eye exhibits no discharge. No scleral icterus. Right eye exhibits normal extraocular motion and no nystagmus. Left eye exhibits normal extraocular motion and no nystagmus.  No nystagmus   Cardiovascular: Normal rate, regular rhythm, S1 normal, S2 normal and normal heart sounds.   No murmur heard. Pulmonary/Chest: Effort normal and breath sounds normal. No respiratory distress. She has no wheezes.  Neurological: She is alert and oriented to person, place, and time. She has normal strength. Gait normal.  CN 2-12 grossly intact, baseline walks with walker 4-5/5 strength LLE due to cerebal palsy 5/5 strength all other extremities   Skin: Skin is warm, dry and intact. No rash noted. She is not diaphoretic.  Psychiatric: She has a normal mood and affect. Her speech is normal and behavior is normal. Judgment and thought content normal. Cognition and memory are normal.  Nursing note and vitals reviewed.         Assessment & Plan:  F/u in 1-2 months prn

## 2014-08-28 NOTE — Progress Notes (Signed)
Internal Medicine Clinic Attending  Case discussed with Dr. McLean soon after the resident saw the patient.  We reviewed the resident's history and exam and pertinent patient test results.  I agree with the assessment, diagnosis, and plan of care documented in the resident's note. 

## 2014-08-30 ENCOUNTER — Other Ambulatory Visit: Payer: Self-pay | Admitting: Otolaryngology

## 2014-08-30 DIAGNOSIS — H9042 Sensorineural hearing loss, unilateral, left ear, with unrestricted hearing on the contralateral side: Secondary | ICD-10-CM

## 2014-08-30 DIAGNOSIS — G801 Spastic diplegic cerebral palsy: Secondary | ICD-10-CM

## 2014-09-01 ENCOUNTER — Telehealth: Payer: Self-pay | Admitting: Internal Medicine

## 2014-09-01 NOTE — Telephone Encounter (Signed)
Pt sch MRI 09/11/14. She has a couple of questions.  Left ear if hiccups it will "beep"  Told the pt not sure what could be but MRI would not see into ear we would have to look.  She also asks if she still has CKD 3 and what to do.  Asks what she can do to protect her kidneys. Disc'ed avoiding Ibuprofen/Advil.  She does take Naproxen, drinking water, BP control.  We will continue to follow her kidneys to make sure they are okay/stable.    Aundra Dubin MD

## 2014-09-05 ENCOUNTER — Ambulatory Visit: Payer: PRIVATE HEALTH INSURANCE | Admitting: Physical Therapy

## 2014-09-06 ENCOUNTER — Ambulatory Visit: Payer: PRIVATE HEALTH INSURANCE | Attending: Internal Medicine | Admitting: Physical Therapy

## 2014-09-06 ENCOUNTER — Encounter: Payer: Self-pay | Admitting: Physical Therapy

## 2014-09-06 DIAGNOSIS — H8111 Benign paroxysmal vertigo, right ear: Secondary | ICD-10-CM | POA: Diagnosis not present

## 2014-09-06 DIAGNOSIS — Z5189 Encounter for other specified aftercare: Secondary | ICD-10-CM | POA: Diagnosis not present
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
## 2014-09-06 NOTE — Patient Instructions (Addendum)
Benign Positional Vertigo Vertigo means you feel like you or your surroundings are moving when they are not. Benign positional vertigo is the most common form of vertigo. Benign means that the cause of your condition is not serious. Benign positional vertigo is more common in older adults. CAUSES  Benign positional vertigo is the result of an upset in the labyrinth system. This is an area in the middle ear that helps control your balance. This may be caused by a viral infection, head injury, or repetitive motion. However, often no specific cause is found. SYMPTOMS  Symptoms of benign positional vertigo occur when you move your head or eyes in different directions. Some of the symptoms may include:  Loss of balance and falls.  Vomiting.  Blurred vision.  Dizziness.  Nausea.  Involuntary eye movements (nystagmus). DIAGNOSIS  Benign positional vertigo is usually diagnosed by physical exam. If the specific cause of your benign positional vertigo is unknown, your caregiver may perform imaging tests, such as magnetic resonance imaging (MRI) or computed tomography (CT). TREATMENT  Your caregiver may recommend movements or procedures to correct the benign positional vertigo. Medicines such as meclizine, benzodiazepines, and medicines for nausea may be used to treat your symptoms. In rare cases, if your symptoms are caused by certain conditions that affect the inner ear, you may need surgery. HOME CARE INSTRUCTIONS   Follow your caregiver's instructions.  Move slowly. Do not make sudden body or head movements.  Avoid driving.  Avoid operating heavy machinery.  Avoid performing any tasks that would be dangerous to you or others during a vertigo episode.  Drink enough fluids to keep your urine clear or pale yellow. SEEK IMMEDIATE MEDICAL CARE IF:   You develop problems with walking, weakness, numbness, or using your arms, hands, or legs.  You have difficulty speaking.  You develop  severe headaches.  Your nausea or vomiting continues or gets worse.  You develop visual changes.  Your family or friends notice any behavioral changes.  Your condition gets worse.  You have a fever.  You develop a stiff neck or sensitivity to light. MAKE SURE YOU:   Understand these instructions.  Will watch your condition.  Will get help right away if you are not doing well or get worse. Document Released: 07/14/2006 Document Revised: 12/29/2011 Document Reviewed: 06/26/2011 ExitCare Patient Information 2015 [employer]. This information is not intended to replace advice given to you by your health care provider. Make sure you discuss any questions you have with your health care provider. Self Treatment for Right Posterior / Anterior Canalithiasis   Sitting on bed: 1. Turn head 45 right. (a) Lie back slowly, shoulders on pillow, head on bed. (b) Hold ____ seconds. 2. Keeping head on bed, turn head 90 left. Hold ____ seconds. 3. Roll to left, head on 45 angle down toward bed. Hold ____ seconds. 4. Sit up on left side of bed. Repeat ____ times per session. Do ____ sessions per day.  Copyright  VHI. All rights reserved.   

## 2014-09-07 ENCOUNTER — Other Ambulatory Visit: Payer: Self-pay | Admitting: Internal Medicine

## 2014-09-07 ENCOUNTER — Other Ambulatory Visit: Payer: Medicare Other

## 2014-09-07 ENCOUNTER — Other Ambulatory Visit: Payer: PRIVATE HEALTH INSURANCE

## 2014-09-07 DIAGNOSIS — H8111 Benign paroxysmal vertigo, right ear: Secondary | ICD-10-CM

## 2014-09-07 NOTE — Progress Notes (Signed)
Will re-order vestibular rehab due to improvement after 1st session.    Dr. Naaman Plummer  Received this email from vestibular rehab:  Dr. Naaman Plummer,  I evaluated Kathy Owens (04/19/1956) yesterday and treated her for L BPPV - she was doing well when we finished and the BPPV appeared to be resolved. I did an evaluation/discharge combo because she did not feel another appt. was needed and has transportation issues so a follow up appt. was not scheduled.   She just called and reports that she had an episode of vertigo last evening and wishes to schedule another appt with me.  If you agree please send a referral through Epic and we will get her scheduled to come back ASAP.    Thank you,  Guido Sander, PT

## 2014-09-08 ENCOUNTER — Other Ambulatory Visit: Payer: Self-pay | Admitting: *Deleted

## 2014-09-08 DIAGNOSIS — K219 Gastro-esophageal reflux disease without esophagitis: Secondary | ICD-10-CM

## 2014-09-08 DIAGNOSIS — H8110 Benign paroxysmal vertigo, unspecified ear: Secondary | ICD-10-CM

## 2014-09-08 MED ORDER — ONDANSETRON HCL 4 MG PO TABS: 4.0000 mg | ORAL_TABLET | Freq: Three times a day (TID) | ORAL | Status: DC | PRN

## 2014-09-11 ENCOUNTER — Other Ambulatory Visit: Payer: PRIVATE HEALTH INSURANCE

## 2014-09-19 ENCOUNTER — Other Ambulatory Visit: Payer: PRIVATE HEALTH INSURANCE

## 2014-09-20 ENCOUNTER — Ambulatory Visit
Admission: RE | Admit: 2014-09-20 | Discharge: 2014-09-20 | Disposition: A | Discharge status: Home / Self Care | Payer: PRIVATE HEALTH INSURANCE | Source: Ambulatory Visit | Attending: Otolaryngology | Admitting: Otolaryngology

## 2014-09-20 DIAGNOSIS — G801 Spastic diplegic cerebral palsy: Secondary | ICD-10-CM

## 2014-09-20 DIAGNOSIS — H9042 Sensorineural hearing loss, unilateral, left ear, with unrestricted hearing on the contralateral side: Secondary | ICD-10-CM

## 2014-09-20 MED ORDER — GADOBENATE DIMEGLUMINE 529 MG/ML IV SOLN: 14.0000 mL | Freq: Once | INTRAVENOUS | Status: AC | PRN

## 2014-09-21 ENCOUNTER — Other Ambulatory Visit: Payer: Self-pay | Admitting: Oncology

## 2014-09-21 ENCOUNTER — Other Ambulatory Visit: Payer: Self-pay | Admitting: Internal Medicine

## 2014-10-05 ENCOUNTER — Other Ambulatory Visit: Payer: Self-pay | Admitting: *Deleted

## 2014-10-05 DIAGNOSIS — M25512 Pain in left shoulder: Principal | ICD-10-CM

## 2014-10-05 DIAGNOSIS — M25511 Pain in right shoulder: Secondary | ICD-10-CM

## 2014-10-06 MED ORDER — HYDROCODONE-ACETAMINOPHEN 10-325 MG PO TABS: 1.0000 | ORAL_TABLET | Freq: Four times a day (QID) | ORAL | Status: DC | PRN

## 2014-10-06 NOTE — Telephone Encounter (Signed)
Pt.notified

## 2014-10-26 ENCOUNTER — Other Ambulatory Visit: Payer: Medicare Other

## 2014-11-01 ENCOUNTER — Other Ambulatory Visit: Payer: Self-pay | Admitting: *Deleted

## 2014-11-01 DIAGNOSIS — M25511 Pain in right shoulder: Secondary | ICD-10-CM

## 2014-11-01 DIAGNOSIS — M25512 Pain in left shoulder: Principal | ICD-10-CM

## 2014-11-01 MED ORDER — HYDROCODONE-ACETAMINOPHEN 10-325 MG PO TABS: 1.0000 | ORAL_TABLET | Freq: Four times a day (QID) | ORAL | Status: DC | PRN

## 2014-11-07 ENCOUNTER — Telehealth: Payer: Self-pay | Admitting: *Deleted

## 2014-11-07 NOTE — Telephone Encounter (Signed)
Tried to call, no answer and busy

## 2014-11-13 ENCOUNTER — Other Ambulatory Visit: Payer: Medicare Other

## 2014-12-05 ENCOUNTER — Other Ambulatory Visit: Payer: Self-pay | Admitting: *Deleted

## 2014-12-05 DIAGNOSIS — M25511 Pain in right shoulder: Secondary | ICD-10-CM

## 2014-12-05 DIAGNOSIS — M25512 Pain in left shoulder: Principal | ICD-10-CM

## 2014-12-05 MED ORDER — HYDROCODONE-ACETAMINOPHEN 10-325 MG PO TABS: 1.0000 | ORAL_TABLET | Freq: Four times a day (QID) | ORAL | Status: DC | PRN

## 2014-12-05 NOTE — Telephone Encounter (Signed)
Last refill 1/20 Pt # [phone]

## 2014-12-07 NOTE — Telephone Encounter (Signed)
Pt informed Rx is ready 

## 2014-12-14 ENCOUNTER — Ambulatory Visit (INDEPENDENT_AMBULATORY_CARE_PROVIDER_SITE_OTHER): Payer: Medicare Other | Admitting: Podiatrist

## 2014-12-14 DIAGNOSIS — B351 Tinea unguium: Secondary | ICD-10-CM

## 2014-12-14 DIAGNOSIS — M79673 Pain in unspecified foot: Secondary | ICD-10-CM

## 2014-12-14 NOTE — Progress Notes (Signed)
Chief Complaint  Patient presents with  . Nail Problem    debridement     HPI: Patient is 59 y.o. female who presents today for follow up of foot care. She is doing well and relates some discomfort on medial arch of the right foot where there is a prominent navicular tuberosity.     Allergies  Allergen Reactions  . Morphine And Related Other (See Comments)    "don't like the way it makes me feel, makes me feel nuts."    Physical Exam  Patient is awake, alert, and oriented x 3.  In no acute distress.  Uses ambulation aids due to CP.  Vascular status is intact with palpable pedal pulses at 2/4 DP and PT bilateral and capillary refill time within normal limits. Neurological sensation is also intact bilaterally via Semmes Weinstein monofilament at 5/5 sites. Light touch, vibratory sensation intact. Dermatological exam reveals all 10 toenails to be thick, discolored, dystrophic and mycotic. Right hallux nail has incurvation on both borders with no infection present.  Navicular prominence of the right foot is noted.  Feet and ankles are flat and contracted.   Assessment: symptomatic mycotic toenails, prominent navicular tuberosity  Plan: debridement of nails accomplished without complication.  Applied padding to the navicular tuberosity.  She will return for continued care.

## 2015-01-02 ENCOUNTER — Other Ambulatory Visit: Payer: Self-pay | Admitting: *Deleted

## 2015-01-02 DIAGNOSIS — M25512 Pain in left shoulder: Principal | ICD-10-CM

## 2015-01-02 DIAGNOSIS — M25511 Pain in right shoulder: Secondary | ICD-10-CM

## 2015-01-02 NOTE — Telephone Encounter (Signed)
#

## 2015-01-05 MED ORDER — HYDROCODONE-ACETAMINOPHEN 10-325 MG PO TABS: 1.0000 | ORAL_TABLET | Freq: Four times a day (QID) | ORAL | Status: DC | PRN

## 2015-01-24 ENCOUNTER — Telehealth: Payer: Self-pay | Admitting: *Deleted

## 2015-01-24 NOTE — Telephone Encounter (Addendum)
Pt states Dr. Paulla Dolly trims her toenails, but she has a bone that protrudes at the bottom of her foot, and she would like a medication like Naprosyn.  Pt states she has a pain contract and gets Vicocin from Dr. Ellwood Dense.  Left message informing pt I reviewed her medications and Dr. Paulla Dolly had not prescribed Naprosyn, but she could use Aleve as the package directs or come in for the foot pain.

## 2015-01-25 ENCOUNTER — Telehealth: Payer: Self-pay | Admitting: *Deleted

## 2015-01-25 NOTE — Telephone Encounter (Addendum)
Pt states she can get rx of Naprosyn for a dollar and would like Dr. Paulla Dolly to prescribe.  Left message informing pt Naprosyn had been ordered through the [facility] at the [facility].

## 2015-01-26 MED ORDER — NAPROXEN 500 MG PO TABS: 500.0000 mg | ORAL_TABLET | Freq: Two times a day (BID) | ORAL | Status: DC

## 2015-01-26 NOTE — Telephone Encounter (Signed)
ok 

## 2015-01-29 ENCOUNTER — Telehealth: Payer: Self-pay | Admitting: Internal Medicine

## 2015-01-29 NOTE — Telephone Encounter (Signed)
Call to patient to confirm appointment for 01/30/15 at 9:45 lmtcb

## 2015-01-30 ENCOUNTER — Encounter: Payer: Medicaid Other | Admitting: Internal Medicine

## 2015-01-31 ENCOUNTER — Ambulatory Visit: Payer: PRIVATE HEALTH INSURANCE | Admitting: Podiatry

## 2015-01-31 ENCOUNTER — Other Ambulatory Visit: Payer: Self-pay | Admitting: *Deleted

## 2015-01-31 DIAGNOSIS — M25512 Pain in left shoulder: Principal | ICD-10-CM

## 2015-01-31 DIAGNOSIS — M25511 Pain in right shoulder: Secondary | ICD-10-CM

## 2015-01-31 MED ORDER — HYDROCODONE-ACETAMINOPHEN 10-325 MG PO TABS: 1.0000 | ORAL_TABLET | Freq: Four times a day (QID) | ORAL | Status: DC | PRN

## 2015-01-31 NOTE — Telephone Encounter (Signed)
Last refill 3/18 Last visit 11/6 Pt # [phone]

## 2015-01-31 NOTE — Telephone Encounter (Signed)
Pt informed Rx is ready 

## 2015-02-01 ENCOUNTER — Encounter: Payer: Medicaid Other | Admitting: Internal Medicine

## 2015-02-28 ENCOUNTER — Encounter: Payer: Self-pay | Admitting: *Deleted

## 2015-03-06 ENCOUNTER — Other Ambulatory Visit: Payer: Self-pay | Admitting: *Deleted

## 2015-03-06 DIAGNOSIS — M25512 Pain in left shoulder: Principal | ICD-10-CM

## 2015-03-06 DIAGNOSIS — M25511 Pain in right shoulder: Secondary | ICD-10-CM

## 2015-03-08 MED ORDER — HYDROCODONE-ACETAMINOPHEN 10-325 MG PO TABS: 1.0000 | ORAL_TABLET | Freq: Four times a day (QID) | ORAL | Status: DC | PRN

## 2015-03-08 NOTE — Telephone Encounter (Signed)
Pt informed

## 2015-03-20 ENCOUNTER — Encounter: Payer: Medicaid Other | Admitting: Internal Medicine

## 2015-03-26 ENCOUNTER — Ambulatory Visit: Payer: PRIVATE HEALTH INSURANCE

## 2015-03-27 ENCOUNTER — Ambulatory Visit: Payer: Medicare Other | Admitting: Podiatry

## 2015-04-03 ENCOUNTER — Other Ambulatory Visit: Payer: Self-pay | Admitting: *Deleted

## 2015-04-03 DIAGNOSIS — M25512 Pain in left shoulder: Principal | ICD-10-CM

## 2015-04-03 DIAGNOSIS — M25511 Pain in right shoulder: Secondary | ICD-10-CM

## 2015-04-03 NOTE — Telephone Encounter (Signed)
Could you please write 3 scripts this month, we will hold them but it will save a lot of phone calls and complaints

## 2015-04-04 MED ORDER — HYDROCODONE-ACETAMINOPHEN 10-325 MG PO TABS: 1.0000 | ORAL_TABLET | Freq: Four times a day (QID) | ORAL | Status: DC | PRN

## 2015-04-04 NOTE — Telephone Encounter (Signed)
notified

## 2015-04-10 ENCOUNTER — Other Ambulatory Visit: Payer: Self-pay | Admitting: Podiatry

## 2015-05-03 ENCOUNTER — Other Ambulatory Visit: Payer: Self-pay | Admitting: Podiatry

## 2015-05-03 NOTE — Telephone Encounter (Signed)
Pt needs an appt if problem continues.

## 2015-05-07 ENCOUNTER — Other Ambulatory Visit: Payer: Self-pay | Admitting: *Deleted

## 2015-05-07 DIAGNOSIS — M25512 Pain in left shoulder: Principal | ICD-10-CM

## 2015-05-07 DIAGNOSIS — M25511 Pain in right shoulder: Secondary | ICD-10-CM

## 2015-05-07 MED ORDER — HYDROCODONE-ACETAMINOPHEN 10-325 MG PO TABS: 1.0000 | ORAL_TABLET | Freq: Four times a day (QID) | ORAL | Status: DC | PRN

## 2015-05-07 NOTE — Telephone Encounter (Signed)
Pt aware Rx is ready. 

## 2015-05-10 ENCOUNTER — Ambulatory Visit (HOSPITAL_COMMUNITY)
Admission: RE | Admit: 2015-05-10 | Discharge: 2015-05-10 | Disposition: A | Discharge status: Home / Self Care | Payer: Medicare Other | Source: Ambulatory Visit | Attending: Internal Medicine | Admitting: Internal Medicine

## 2015-05-10 ENCOUNTER — Ambulatory Visit (INDEPENDENT_AMBULATORY_CARE_PROVIDER_SITE_OTHER): Payer: Medicare Other | Admitting: Internal Medicine

## 2015-05-10 ENCOUNTER — Encounter: Payer: Self-pay | Admitting: Internal Medicine

## 2015-05-10 ENCOUNTER — Telehealth: Payer: Self-pay | Admitting: Internal Medicine

## 2015-05-10 VITALS — BP 140/71 | HR 88 | Temp 97.5°F | Wt 153.0 lb

## 2015-05-10 DIAGNOSIS — N183 Chronic kidney disease, stage 3 unspecified: Secondary | ICD-10-CM

## 2015-05-10 DIAGNOSIS — M19071 Primary osteoarthritis, right ankle and foot: Secondary | ICD-10-CM | POA: Diagnosis not present

## 2015-05-10 DIAGNOSIS — E785 Hyperlipidemia, unspecified: Secondary | ICD-10-CM | POA: Diagnosis not present

## 2015-05-10 DIAGNOSIS — M79671 Pain in right foot: Secondary | ICD-10-CM

## 2015-05-10 DIAGNOSIS — H8111 Benign paroxysmal vertigo, right ear: Secondary | ICD-10-CM

## 2015-05-10 DIAGNOSIS — I10 Essential (primary) hypertension: Secondary | ICD-10-CM | POA: Diagnosis not present

## 2015-05-10 DIAGNOSIS — M810 Age-related osteoporosis without current pathological fracture: Secondary | ICD-10-CM | POA: Diagnosis not present

## 2015-05-10 DIAGNOSIS — Z418 Encounter for other procedures for purposes other than remedying health state: Secondary | ICD-10-CM | POA: Diagnosis not present

## 2015-05-10 DIAGNOSIS — Z299 Encounter for prophylactic measures, unspecified: Secondary | ICD-10-CM

## 2015-05-10 NOTE — Assessment & Plan Note (Signed)
Assessment: DEXA 08/08/2014 Left femur T score -2.8  Plan:  Discussed restarting Alendronate with patient. She wants literature on the medication and wants to reconsider.   Approach this subject next visit.

## 2015-05-10 NOTE — Assessment & Plan Note (Signed)
Lab Results  Component Value Date   CHOL 163 08/03/2014   HDL 65 08/03/2014   LDLCALC 79 08/03/2014   TRIG 97 08/03/2014   CHOLHDL 2.5 08/03/2014   Continue current plan of care.

## 2015-05-10 NOTE — Telephone Encounter (Signed)
Phone busy.

## 2015-05-10 NOTE — Telephone Encounter (Signed)
Phone busy 3:08PM.

## 2015-05-10 NOTE — Telephone Encounter (Signed)
Line busy 3:54PM

## 2015-05-10 NOTE — Assessment & Plan Note (Addendum)
Assessment: Xray right foot done today. Degenerative and possible stress fracture related changes seen which is quite possible given osteoporosis.  IMPRESSION: 1. Subtle cortical irregularity of the shafts of the proximal phalanges of the third and fourth toes. A small amount of periosteal reaction is suspected and leads one to suspect that these are stress or insufficiency fractures given the lack of known trauma. 2. Mild mild degenerative interphalangeal joint changes at multiple sites. 3. Moderate degenerative change of the hindfoot with loss of the normal plantar arch. No acute fracture is observed.  Plan:  Discussed alendronate, patient wants to think about it.  Cold compress, reduced activity, put weight on the heel advised over the phone after the visit.  Tylenol as needed, patient also has Norco.   If pain continues, will send to ortho.   ADDENDUM 05/11/15 10:21 am Discussed right foot xray with patient and her sister Charleston Ropes.

## 2015-05-10 NOTE — Assessment & Plan Note (Signed)
Resolved

## 2015-05-10 NOTE — Telephone Encounter (Signed)
Message left on ID phone recording.

## 2015-05-10 NOTE — Patient Instructions (Addendum)
New Information  Your XRay right foot results show some age related changes, some wear and tear changes in the bones. It is also possible that you had some stress fractures from walking. - Please try to restrict your activity for 4-6 weeks. Decrease putting load on your right front foot.  - Please apply cold compresses/hot compresses to decrease pain.  - You can take Tylenol or Norco for your pain.  - If your pain does not become better in 1-2 weeks please call us and we will set up a referral with orthopedics.    Kathy Owens  Today we will get an Xray of your right foot. I will call you after I get the report.  Please use cold compresses for the pain in your foot.  We will do some blood work for your kidneys today.  Please find some information on ALENDRONATE below.  NEXT VISIT - BRING ALL YOUR PILL BOTTLES Next visit 3 months.   Kathy Fireman MD MPH 05/10/2015 11:43 AM [facility] [address] [place], [place] [address]. Ph: [phone] Hours: 8 am - 5 pm    Alendronate weekly tablets What is this medicine? ALENDRONATE (a LEN droe nate) slows calcium loss from bones. It helps to make healthy bone and to slow bone loss in people with osteoporosis. It may be used to treat Paget's disease. This medicine may be used for other purposes; ask your health care provider or pharmacist if you have questions. COMMON BRAND NAME(S): Fosamax What should I tell my health care provider before I take this medicine? They need to know if you have any of these conditions: -esophagus, stomach, or intestine problems, like acid-reflux or GERD -dental disease -kidney disease -low blood calcium -low vitamin D -problems swallowing -problems sitting or standing for 30 minutes -an unusual or allergic reaction to alendronate, other medicines, foods, dyes, or preservatives -pregnant or trying to get pregnant -breast-feeding How should I use this medicine? You must take this medicine  exactly as directed or you will lower the amount of medicine you absorb into your body or you may cause yourself harm. Take your dose by mouth first thing in the morning, after you are up for the day. Do not eat or drink anything before you take this medicine. Swallow your medicine with a full glass (6 to 8 fluid ounces) of plain water. Do not take this tablet with any other drink. Do not chew or crush the tablet. After taking this medicine, do not eat breakfast, drink, or take any medicines or vitamins for at least 30 minutes. Stand or sit up for at least 30 minutes after you take this medicine; do not lie down. Take this medicine on the same day every week. Do not take your medicine more often than directed. Talk to your pediatrician regarding the use of this medicine in children. Special care may be needed. Overdosage: If you think you have taken too much of this medicine contact a poison control center or emergency room at once. NOTE: This medicine is only for you. Do not share this medicine with others. What if I miss a dose? If you miss a dose, take the dose on the morning after you remember. Then take your next dose on your regular day of the week. Never take 2 tablets on the same day. Do not take double or extra doses. What may interact with this medicine? -aluminum hydroxide -antacids -aspirin -calcium supplements -drugs for inflammation like ibuprofen, naproxen, and others -iron  supplements -magnesium supplements -vitamins with minerals This list may not describe all possible interactions. Give your health care provider a list of all the medicines, herbs, non-prescription drugs, or dietary supplements you use. Also tell them if you smoke, drink alcohol, or use illegal drugs. Some items may interact with your medicine. What should I watch for while using this medicine? Visit your doctor or health care professional for regular checks ups. It may be some time before you see benefit from this  medicine. Do not stop taking your medication except on your doctor's advice. Your doctor or health care professional may order blood tests and other tests to see how you are doing. You should make sure you get enough calcium and vitamin D while you are taking this medicine, unless your doctor tells you not to. Discuss the foods you eat and the vitamins you take with your health care professional. Some people who take this medicine have severe bone, joint, and/or muscle pain. This medicine may also increase your risk for a broken thigh bone. Tell your doctor right away if you have pain in your upper leg or groin. Tell your doctor if you have any pain that does not go away or that gets worse. This medicine can make you more sensitive to the sun. If you get a rash while taking this medicine, sunlight may cause the rash to get worse. Keep out of the sun. If you cannot avoid being in the sun, wear protective clothing and use sunscreen. Do not use sun lamps or tanning beds/booths. What side effects may I notice from receiving this medicine? Side effects that you should report to your doctor or health care professional as soon as possible: -allergic reactions like skin rash, itching or hives, swelling of the face, lips, or tongue -black or tarry stools -bone, muscle or joint pain -changes in vision -chest pain -heartburn or stomach pain -jaw pain, especially after dental work -pain or trouble when swallowing -redness, blistering, peeling or loosening of the skin, including inside the mouth Side effects that usually do not require medical attention (report to your doctor or health care professional if they continue or are bothersome): -changes in taste -diarrhea or constipation -eye pain or itching -headache -nausea or vomiting -stomach gas or fullness This list may not describe all possible side effects. Call your doctor for medical advice about side effects. You may report side effects to FDA at  1-800-FDA-1088. Where should I keep my medicine? Keep out of the reach of children. Store at room temperature of 15 and 30 degrees C (59 and 86 degrees F). Throw away any unused medicine after the expiration date. NOTE: This sheet is a summary. It may not cover all possible information. If you have questions about this medicine, talk to your doctor, pharmacist, or health care provider.  2015, Elsevier/Gold Standard. (2011-08-21 09:02:42)

## 2015-05-10 NOTE — Assessment & Plan Note (Signed)
CMP today.   Medication management Kathy Owens does not remember what allergy medication she is taking but says its not benadryl. She reports that she is taking something for allergies that comes free to her. Also, she reports she is taking tizanidine, however it has not been refilled in a long time. In other words, I am not sure if she is managing her medications well. I have asked her to bring all her pill bottles to clinic next visit.

## 2015-05-10 NOTE — Telephone Encounter (Signed)
Patient states she has some questions about some new medication.

## 2015-05-10 NOTE — Assessment & Plan Note (Signed)
BP Readings from Last 3 Encounters:  05/10/15 140/71  08/25/14 127/70  08/14/14 127/83   Continue current medications.

## 2015-05-10 NOTE — Progress Notes (Signed)
   Subjective:    Patient ID: Kathy Owens, female    DOB: Mar 16, 1956, 59 y.o.   MRN: [id number]  HPI Kathy Owens is a 59 year old female with cerebral palsy, independent and competent, who follows up with the clinic for hypertension, hyperlipidemia, osteoporosis, CKD and preventive measures. Today she comes in with the complaint of right foot pain, which developed 2 weeks ago. She has some trouble walking on it, however she has always had some foot pain associated with walking due to muscle contractures/spasms. She also notes that she has had a bone spur in her right foot from years but we have no imaging for it. No recent trauma.   She also has osteoporosis on a recent DEXA scan from 2015, and she has been on alendronate in the past without side effects. We discussed getting restarted on the medication this visit.   She has not had vertigo since her last episode in Nov-Dec 2015. She does not take any medications prescribed to her at the time like Zofran.  Review of Systems  Constitutional: Negative for fever, fatigue and unexpected weight change.  Musculoskeletal: Positive for back pain, arthralgias and gait problem. Negative for myalgias and joint swelling.  Neurological: Negative for dizziness, syncope, weakness and light-headedness.      Objective:   Physical Exam  Constitutional: She is oriented to person, place, and time. She appears well-developed and well-nourished. No distress.  Walks with walker.  HENT:  Head: Normocephalic and atraumatic.  Eyes: Conjunctivae are normal. Pupils are equal, round, and reactive to light.  Neck: Normal range of motion. Neck supple.  Cardiovascular: Normal rate, regular rhythm, normal heart sounds and intact distal pulses.   Pulmonary/Chest: Effort normal and breath sounds normal. No respiratory distress. She has no wheezes. She has no rales. She exhibits no tenderness.  Abdominal: Soft. Bowel sounds are normal.  Musculoskeletal:  Right foot mildly  tender to palpation on dorsum, no swelling around dorsum of foot where the patient notes most pain. Mild difficulty in walking noted.  Neurological: She is alert and oriented to person, place, and time.  Skin: Skin is warm. She is not diaphoretic.      Assessment & Plan:  Please see problem based charting.

## 2015-05-11 LAB — COMPLETE METABOLIC PANEL WITH GFR
ALT: 15 U/L (ref 0–35)
AST: 18 U/L (ref 0–37)
Albumin: 4.1 g/dL (ref 3.5–5.2)
Alkaline Phosphatase: 46 U/L (ref 39–117)
BUN: 29 mg/dL — ABNORMAL HIGH (ref 6–23)
CO2: 21 mEq/L (ref 19–32)
CREATININE: 1.12 mg/dL — AB (ref 0.50–1.10)
Calcium: 9.7 mg/dL (ref 8.4–10.5)
Chloride: 106 mEq/L (ref 96–112)
GFR, EST AFRICAN AMERICAN: 62 mL/min
GFR, Est Non African American: 54 mL/min — ABNORMAL LOW
Glucose, Bld: 86 mg/dL (ref 70–99)
POTASSIUM: 3.9 meq/L (ref 3.5–5.3)
SODIUM: 143 meq/L (ref 135–145)
Total Bilirubin: 0.6 mg/dL (ref 0.2–1.2)
Total Protein: 7.1 g/dL (ref 6.0–8.3)

## 2015-05-11 NOTE — Progress Notes (Signed)
New AVS mailed to pt.

## 2015-05-11 NOTE — Telephone Encounter (Signed)
Pt called clinic to talk with Dr Ellwood Dense.

## 2015-05-11 NOTE — Telephone Encounter (Signed)
Line busy

## 2015-05-14 ENCOUNTER — Other Ambulatory Visit: Payer: Self-pay | Admitting: *Deleted

## 2015-05-14 DIAGNOSIS — I10 Essential (primary) hypertension: Secondary | ICD-10-CM

## 2015-05-14 DIAGNOSIS — K219 Gastro-esophageal reflux disease without esophagitis: Secondary | ICD-10-CM

## 2015-05-14 DIAGNOSIS — E785 Hyperlipidemia, unspecified: Secondary | ICD-10-CM

## 2015-05-14 MED ORDER — OMEPRAZOLE 20 MG PO CPDR: 20.0000 mg | DELAYED_RELEASE_CAPSULE | Freq: Every day | ORAL | Status: DC

## 2015-05-14 MED ORDER — TRIAMTERENE-HCTZ 37.5-25 MG PO TABS: 1.0000 | ORAL_TABLET | Freq: Every day | ORAL | Status: DC

## 2015-05-14 MED ORDER — ATORVASTATIN CALCIUM 10 MG PO TABS: 10.0000 mg | ORAL_TABLET | Freq: Every day | ORAL | Status: DC

## 2015-05-14 NOTE — Telephone Encounter (Signed)
Requesting 3 months supplies.

## 2015-05-29 ENCOUNTER — Encounter: Payer: Self-pay | Admitting: Podiatry

## 2015-05-29 ENCOUNTER — Ambulatory Visit (INDEPENDENT_AMBULATORY_CARE_PROVIDER_SITE_OTHER): Payer: Medicare Other | Admitting: Podiatry

## 2015-05-29 DIAGNOSIS — B351 Tinea unguium: Secondary | ICD-10-CM | POA: Diagnosis not present

## 2015-05-29 DIAGNOSIS — M79673 Pain in unspecified foot: Secondary | ICD-10-CM

## 2015-05-29 NOTE — Progress Notes (Signed)
Subjective:     Patient ID: Kathy Owens, female   DOB: 10/09/1956, 59 y.o.   MRN: [id number]  HPIThis patient presents to office for preventive foot care services.  She has hx of CP and has long thick painful nails.  She has pain walking and wearing her shoes.   Review of Systems     Objective:   Physical Exam GENERAL APPEARANCE: Alert, conversant. Appropriately groomed. No acute distress.  VASCULAR: Pedal pulses palpable at  [place] and PT bilateral.  Capillary refill time is immediate to all digits,  Normal temperature gradient.  Digital hair growth is present bilateral  NEUROLOGIC: sensation is normal to 5.07 monofilament at 5/5 sites bilateral.  Light touch is intact bilateral   DERMATOLOGIC: skin color, texture, and turgor are within normal limits.  No preulcerative lesions or ulcers  are seen, no interdigital maceration noted.  No open lesions present.   No drainage noted. NAILS  Thick disfigured discolored nails both feet      Assessment:  Onychomycosis  B/L     Plan:    Debridement of Nails both feet.  RTC 3 months

## 2015-06-05 ENCOUNTER — Other Ambulatory Visit: Payer: Self-pay | Admitting: Internal Medicine

## 2015-06-05 DIAGNOSIS — M25512 Pain in left shoulder: Principal | ICD-10-CM

## 2015-06-05 DIAGNOSIS — M25511 Pain in right shoulder: Secondary | ICD-10-CM

## 2015-06-05 MED ORDER — HYDROCODONE-ACETAMINOPHEN 10-325 MG PO TABS: 1.0000 | ORAL_TABLET | Freq: Four times a day (QID) | ORAL | Status: DC | PRN

## 2015-06-05 NOTE — Telephone Encounter (Signed)
Pt called requesting pain med to be filled. °

## 2015-06-05 NOTE — Telephone Encounter (Signed)
Message left on ID recording. 

## 2015-07-03 ENCOUNTER — Other Ambulatory Visit: Payer: Self-pay | Admitting: Internal Medicine

## 2015-07-03 DIAGNOSIS — M25511 Pain in right shoulder: Secondary | ICD-10-CM

## 2015-07-03 DIAGNOSIS — M25512 Pain in left shoulder: Principal | ICD-10-CM

## 2015-07-03 MED ORDER — HYDROCODONE-ACETAMINOPHEN 10-325 MG PO TABS: 1.0000 | ORAL_TABLET | Freq: Four times a day (QID) | ORAL | Status: DC | PRN

## 2015-07-03 NOTE — Telephone Encounter (Signed)
Last refill 8/18 Last OV 7/21 Last UDS in 2012 with report - Patient's urine drug screen is negative for Vicodin/opiate metabolites.

## 2015-07-03 NOTE — Telephone Encounter (Signed)
Pt called requesting vicodin to be filled.

## 2015-07-10 DIAGNOSIS — H6121 Impacted cerumen, right ear: Secondary | ICD-10-CM | POA: Diagnosis not present

## 2015-08-02 ENCOUNTER — Other Ambulatory Visit: Payer: Self-pay | Admitting: Internal Medicine

## 2015-08-02 DIAGNOSIS — M25511 Pain in right shoulder: Secondary | ICD-10-CM

## 2015-08-02 DIAGNOSIS — M25512 Pain in left shoulder: Principal | ICD-10-CM

## 2015-08-02 NOTE — Telephone Encounter (Signed)
appt 10/20 dr rivet Last filled in clinic 09/13 Last uds 2012

## 2015-08-02 NOTE — Telephone Encounter (Signed)
Pt requesting hydrocodone to be filled.  °

## 2015-08-06 ENCOUNTER — Other Ambulatory Visit: Payer: Self-pay | Admitting: Internal Medicine

## 2015-08-06 DIAGNOSIS — M25512 Pain in left shoulder: Principal | ICD-10-CM

## 2015-08-06 DIAGNOSIS — M25511 Pain in right shoulder: Secondary | ICD-10-CM

## 2015-08-06 MED ORDER — HYDROCODONE-ACETAMINOPHEN 10-325 MG PO TABS: 1.0000 | ORAL_TABLET | Freq: Four times a day (QID) | ORAL | Status: DC | PRN

## 2015-08-06 NOTE — Telephone Encounter (Signed)
Pt here to pick up rx-which is not ready for pickup.  States she's completely out-has upcoming appt on 10/20 with Dr Arcelia Jew and last uds in 2012.  Pt's pcp not available at this time, will forward to attending for review. Please advise.Kathy Hidden Cassady10/17/20161:54 PM

## 2015-08-06 NOTE — Telephone Encounter (Signed)
Rx written by [facility] attending and given to pt .Despina Hidden Cassady10/17/20162:46 PM

## 2015-08-09 ENCOUNTER — Ambulatory Visit (INDEPENDENT_AMBULATORY_CARE_PROVIDER_SITE_OTHER): Payer: Medicare Other | Admitting: Internal Medicine

## 2015-08-09 ENCOUNTER — Encounter: Payer: Self-pay | Admitting: Internal Medicine

## 2015-08-09 VITALS — BP 125/78 | HR 78 | Temp 98.2°F | Wt 152.3 lb

## 2015-08-09 DIAGNOSIS — Z23 Encounter for immunization: Secondary | ICD-10-CM | POA: Diagnosis not present

## 2015-08-09 DIAGNOSIS — Z299 Encounter for prophylactic measures, unspecified: Secondary | ICD-10-CM

## 2015-08-09 DIAGNOSIS — M810 Age-related osteoporosis without current pathological fracture: Secondary | ICD-10-CM

## 2015-08-09 DIAGNOSIS — I10 Essential (primary) hypertension: Secondary | ICD-10-CM | POA: Diagnosis not present

## 2015-08-09 NOTE — Assessment & Plan Note (Signed)
Patient still very reluctant to start alendronate despite educating her that the risk of osteonecrosis of the jaw is a very rare complication of bisphosphonate therapy. She states she would like to think about it more. I explained to her that she is at risk for fractures given her gait difficulties and necessity to ambulate with a walker. I also provided her with information on bisphosphonates and discussed the benefits of starting one in her case. She agrees to look over the information and think about it more. - Continue to discuss benefits of starting alendronate with patient  - Continue calcium and vitamin D supplementation

## 2015-08-09 NOTE — Assessment & Plan Note (Signed)
BP Readings from Last 3 Encounters:  08/09/15 125/78  05/10/15 140/71  08/25/14 127/70    Lab Results  Component Value Date   NA 143 05/10/2015   K 3.9 05/10/2015   CREATININE 1.12* 05/10/2015    Assessment: Blood pressure control:  Well controlled.   Plan: Medications:  Continue Triamterene-HCTZ 37.5-25 mg daily

## 2015-08-09 NOTE — Progress Notes (Signed)
   Subjective:    Patient ID: Kathy Owens, female    DOB: Jul 15, 1956, 59 y.o.   MRN: [id number]  HPI [name] is a 59yo woman with PMHx of HTN, cerebral palsy, CKD stage 3, and osteoporosis who presents today for follow up of her HTN and osteoporosis.  HTN: BP well controlled at 125/78 today. She takes Triamterene-HCTZ 37.5-25 mg daily.   Osteoporosis: DEXA from Oct 2015 significant for left femur T score of -2.8. Patient has been reluctant to start alendronate. She states she is fearful of developing osteonecrosis of the jaw. She is currently on calcium and vitamin D supplementation.    Review of Systems General: Denies fever, chills, night sweats, changes in weight, changes in appetite HEENT: Denies headaches, ear pain, changes in vision, rhinorrhea, sore throat CV: Denies CP, palpitations, SOB, orthopnea Pulm: Denies SOB, cough, wheezing GI: Denies abdominal pain, nausea, vomiting, diarrhea, constipation, melena, hematochezia GU: Denies dysuria, hematuria, frequency Msk: Denies muscle cramps, joint pains Neuro: Denies weakness, numbness, tingling Skin: Denies rashes, bruising Psych: Denies depression, anxiety, hallucinations    Objective:   Physical Exam General: alert, sitting up in chair, walks with walker, pleasant HEENT: [place]/AT, EOMI, sclera anicteric, mucus membranes moist CV: RRR, no m/g/r Pulm: CTA bilaterally, breaths non-labored Ext: warm, no peripheral edema. No tenderness to palpation or swelling of feet. Neuro: alert and oriented x 3    Assessment & Plan:  Please refer to A&P documentation.

## 2015-08-09 NOTE — Patient Instructions (Signed)
Please consider starting the Alendronate for your osteoporosis. You are at high risk for fractures and this medicine could help prevent that from happening.  General Instructions:   Please bring your medicines with you each time you come to clinic.  Medicines may include prescription medications, over-the-counter medications, herbal remedies, eye drops, vitamins, or other pills.   Progress Toward Treatment Goals:  Treatment Goal 08/25/2014  Blood pressure at goal    Self Care Goals & Plans:  Self Care Goal 08/09/2015  Manage my medications take my medicines as prescribed; bring my medications to every visit; refill my medications on time  Monitor my health keep track of my blood pressure; check my feet daily  Eat healthy foods eat more vegetables; eat foods that are low in salt; eat baked foods instead of fried foods  Be physically active find an activity I enjoy  Meeting treatment goals -    No flowsheet data found.   Care Management & Community Referrals:  Referral 08/25/2014  Referrals made for care management support -  Referrals made to community resources none

## 2015-08-10 NOTE — Progress Notes (Signed)
Internal Medicine Clinic Attending  Case discussed with Dr. Rivet soon after the resident saw the patient.  We reviewed the resident's history and exam and pertinent patient test results.  I agree with the assessment, diagnosis, and plan of care documented in the resident's note.  

## 2015-08-21 ENCOUNTER — Encounter: Payer: Self-pay | Admitting: *Deleted

## 2015-08-28 ENCOUNTER — Telehealth: Payer: Self-pay | Admitting: *Deleted

## 2015-08-28 ENCOUNTER — Other Ambulatory Visit: Payer: Self-pay | Admitting: Podiatry

## 2015-08-28 NOTE — Telephone Encounter (Addendum)
Pt states she sees Dr. Paulla Dolly to get her toenails clipped, but he gives her Naproxyn for a painful knot on her foot, but it has run out.  Pt request refill.  Dr. Paulla Dolly ordered Naprosyn 500mg  #60 one tablet bid +3 refills.  Orders to pt.

## 2015-08-28 NOTE — Telephone Encounter (Signed)
Sounds fine 

## 2015-08-29 MED ORDER — NAPROXEN 500 MG PO TABS: 500.0000 mg | ORAL_TABLET | Freq: Two times a day (BID) | ORAL | Status: DC

## 2015-09-03 ENCOUNTER — Telehealth: Payer: Self-pay | Admitting: Internal Medicine

## 2015-09-03 DIAGNOSIS — M25512 Pain in left shoulder: Principal | ICD-10-CM

## 2015-09-03 DIAGNOSIS — M25511 Pain in right shoulder: Secondary | ICD-10-CM

## 2015-09-03 MED ORDER — HYDROCODONE-ACETAMINOPHEN 10-325 MG PO TABS: 1.0000 | ORAL_TABLET | Freq: Four times a day (QID) | ORAL | Status: DC | PRN

## 2015-09-03 NOTE — Telephone Encounter (Signed)
Pt called me back stating her sister usually picks up her rx and she will not be able to get to the clinic to get the rx for a few days and "wanted me to know the facts that she may be out before she gets here because she only has 1.5 pills left." Wants me to ask Dr. Lynnae January if her sister can pick up rx again this month.

## 2015-09-03 NOTE — Telephone Encounter (Signed)
Appt with me in Jan [place] database OK No UDS since 2012 No red flags Will fill 3 months UDs today - pls document if she is out of med / last dose

## 2015-09-03 NOTE — Telephone Encounter (Signed)
Pt requesting vicodin to be filled.  °

## 2015-09-03 NOTE — Telephone Encounter (Signed)
Last refill 10/17 Last office visit 10/20 Last UDS 07/2011 Future visit 11/08/2015

## 2015-09-03 NOTE — Telephone Encounter (Signed)
Pt has taken today and has a few pills left.  Is aware rx ready for pick up

## 2015-09-03 NOTE — Telephone Encounter (Signed)
Left vm for pt to call me back. 

## 2015-09-03 NOTE — Telephone Encounter (Signed)
Since "suprise" element gone, sister can pick up one month. Will wait a few months and then check UDS.

## 2015-09-04 ENCOUNTER — Ambulatory Visit: Payer: Medicare Other | Admitting: Podiatry

## 2015-09-28 ENCOUNTER — Ambulatory Visit: Payer: Medicare Other | Admitting: Podiatry

## 2015-10-02 ENCOUNTER — Other Ambulatory Visit: Payer: Self-pay | Admitting: Internal Medicine

## 2015-10-02 DIAGNOSIS — M25512 Pain in left shoulder: Principal | ICD-10-CM

## 2015-10-02 DIAGNOSIS — M25511 Pain in right shoulder: Secondary | ICD-10-CM

## 2015-10-02 NOTE — Telephone Encounter (Signed)
Patient requesting a refill on her Norco

## 2015-10-02 NOTE — Telephone Encounter (Signed)
Pt informed

## 2015-10-02 NOTE — Telephone Encounter (Signed)
Last filled in clinic 11/14 Next appt 1/19 Given note, uds will be 11/08/15

## 2015-10-02 NOTE — Telephone Encounter (Signed)
I printed off 3 Rx in Nov. They might be on file. Read the refill note from Nov - if pt comes, get UDS. If sister comes, do not mention it - I will get it in Jan or some other time.

## 2015-11-08 ENCOUNTER — Encounter: Payer: Self-pay | Admitting: Internal Medicine

## 2015-11-09 ENCOUNTER — Encounter: Payer: Self-pay | Admitting: Podiatry

## 2015-11-09 ENCOUNTER — Ambulatory Visit (INDEPENDENT_AMBULATORY_CARE_PROVIDER_SITE_OTHER): Payer: Medicare Other | Admitting: Podiatry

## 2015-11-09 DIAGNOSIS — B351 Tinea unguium: Secondary | ICD-10-CM | POA: Diagnosis not present

## 2015-11-09 DIAGNOSIS — M79673 Pain in unspecified foot: Secondary | ICD-10-CM

## 2015-11-09 MED ORDER — NAPROXEN 500 MG PO TABS: 500.0000 mg | ORAL_TABLET | Freq: Two times a day (BID) | ORAL | Status: DC

## 2015-11-09 NOTE — Progress Notes (Signed)
Subjective:     Patient ID: Kathy Owens, female   DOB: [birth year], 60 y.o.   MRN: [id number]  HPIThis patient presents to office for preventive foot care services.  She has hx of CP and has long thick painful nails.  She has pain walking and wearing her shoes.   Review of Systems     Objective:   Physical Exam GENERAL APPEARANCE: Alert, conversant. Appropriately groomed. No acute distress.  VASCULAR: Pedal pulses palpable at  [facility] and PT bilateral.  Capillary refill time is immediate to all digits,  Normal temperature gradient.  Digital hair growth is present bilateral  NEUROLOGIC: sensation is normal to 5.07 monofilament at 5/5 sites bilateral.  Light touch is intact bilateral   DERMATOLOGIC: skin color, texture, and turgor are within normal limits.  No preulcerative lesions or ulcers  are seen, no interdigital maceration noted.  No open lesions present.   No drainage noted. NAILS  Thick disfigured discolored nails both feet  Orthopedic  Flattened foot profile right foot with navicular tuberosity.  STJ ROM minimal right foot.    Assessment:  Onychomycosis  B/L  PTTD right foot.    Plan:    Debridement of Nails both feet.  RTC 3 months  Naprosyn refill with 3 refills.   Gardiner Barefoot DPM

## 2015-11-19 ENCOUNTER — Encounter: Payer: Self-pay | Admitting: Internal Medicine

## 2015-12-02 ENCOUNTER — Emergency Department (INDEPENDENT_AMBULATORY_CARE_PROVIDER_SITE_OTHER)
Admission: EM | Admit: 2015-12-02 | Discharge: 2015-12-02 | Disposition: A | Discharge status: Home / Self Care | Payer: Medicare Other | Source: Home / Self Care | Attending: Family Medicine | Admitting: Family Medicine

## 2015-12-02 ENCOUNTER — Encounter (HOSPITAL_COMMUNITY): Payer: Self-pay | Admitting: *Deleted

## 2015-12-02 DIAGNOSIS — S80812A Abrasion, left lower leg, initial encounter: Secondary | ICD-10-CM | POA: Diagnosis not present

## 2015-12-02 MED ORDER — BACITRACIN ZINC 500 UNIT/GM EX OINT
TOPICAL_OINTMENT | CUTANEOUS | Status: AC
  Filled 2015-12-02: qty 1.8

## 2015-12-02 MED ORDER — MUPIROCIN 2 % EX OINT: TOPICAL_OINTMENT | CUTANEOUS | Status: DC

## 2015-12-02 NOTE — ED Notes (Signed)
Pt  Reports    Left  Leg   Swelling         Pt  has  Chronic  Edema     Wears  Ted  Hose    On a  Routine  Basis

## 2015-12-02 NOTE — Discharge Instructions (Signed)
Wash leg and apply medicine twice a day, you must wear support hose to get swelling out of leg. See you doctor as needed.
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
## 2015-12-04 ENCOUNTER — Other Ambulatory Visit: Payer: Self-pay | Admitting: Internal Medicine

## 2015-12-04 DIAGNOSIS — M25512 Pain in left shoulder: Principal | ICD-10-CM

## 2015-12-04 DIAGNOSIS — M25511 Pain in right shoulder: Secondary | ICD-10-CM

## 2015-12-04 NOTE — Telephone Encounter (Signed)
Last refill picked up 11/02/15 Last OV 10/20 (no show and cancelation in Jan) UDS still on to do list

## 2015-12-04 NOTE — Telephone Encounter (Signed)
Patient needs refill for pain medicaiton.

## 2015-12-04 NOTE — Telephone Encounter (Signed)
She must come in person and be seen by a physician to receive any more pain meds. Her pain has not been addressed and documented in a note since before 2015. I am her new PCP and she no showed and cancelled her Jan appt. In addition to the acute visit for pain pills, she must keep her appt with me in Feb. pls do not get a UDS at the acute appt -  I will get at my CC appt. Pls let me know who she is going to see for acute appt so I can inform them. Thanls

## 2015-12-04 NOTE — Telephone Encounter (Signed)
She will see Dr. Ronnald Ramp 0845 Thursday 2/16.  She did mention having to confirm a ride.  I did reiterate that she must keep her appointment to meet new PCP Dr. Lynnae January.  She voiced understanding.

## 2015-12-06 ENCOUNTER — Ambulatory Visit (INDEPENDENT_AMBULATORY_CARE_PROVIDER_SITE_OTHER): Payer: Medicare Other | Admitting: Internal Medicine

## 2015-12-06 ENCOUNTER — Encounter: Payer: Self-pay | Admitting: Internal Medicine

## 2015-12-06 VITALS — BP 137/79 | HR 86 | Temp 97.7°F | Ht 60.0 in | Wt 157.6 lb

## 2015-12-06 DIAGNOSIS — Z299 Encounter for prophylactic measures, unspecified: Secondary | ICD-10-CM

## 2015-12-06 DIAGNOSIS — I10 Essential (primary) hypertension: Secondary | ICD-10-CM

## 2015-12-06 DIAGNOSIS — M79604 Pain in right leg: Secondary | ICD-10-CM

## 2015-12-06 DIAGNOSIS — M25512 Pain in left shoulder: Secondary | ICD-10-CM | POA: Diagnosis not present

## 2015-12-06 DIAGNOSIS — N183 Chronic kidney disease, stage 3 unspecified: Secondary | ICD-10-CM

## 2015-12-06 DIAGNOSIS — M810 Age-related osteoporosis without current pathological fracture: Secondary | ICD-10-CM | POA: Diagnosis not present

## 2015-12-06 DIAGNOSIS — M25511 Pain in right shoulder: Secondary | ICD-10-CM

## 2015-12-06 DIAGNOSIS — I129 Hypertensive chronic kidney disease with stage 1 through stage 4 chronic kidney disease, or unspecified chronic kidney disease: Secondary | ICD-10-CM | POA: Diagnosis not present

## 2015-12-06 DIAGNOSIS — M79605 Pain in left leg: Secondary | ICD-10-CM

## 2015-12-06 MED ORDER — ALENDRONATE SODIUM 70 MG PO TABS: 70.0000 mg | ORAL_TABLET | ORAL | Status: DC

## 2015-12-06 MED ORDER — HYDROCODONE-ACETAMINOPHEN 10-325 MG PO TABS: 1.0000 | ORAL_TABLET | Freq: Four times a day (QID) | ORAL | Status: DC | PRN
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
## 2015-12-06 NOTE — Patient Instructions (Signed)
1. Please return for follow up with Dr. Lynnae January on the 23rd.   2. Please take all medications as previously prescribed with the following changes:  Start taking Alendronate 70 mg per week. Please drink with plenty of water.   3. If you have worsening of your symptoms or new symptoms arise, please call the clinic [phone] or go to the ER immediately if symptoms are severe.  You have done a great job in taking all your medications. Please continue to do this.

## 2015-12-07 LAB — BMP8+ANION GAP
Anion Gap: 22 mmol/L — ABNORMAL HIGH (ref 10.0–18.0)
BUN / CREAT RATIO: 25 (ref 11–26)
BUN: 26 mg/dL (ref 8–27)
CHLORIDE: 100 mmol/L (ref 96–106)
CO2: 21 mmol/L (ref 18–29)
Calcium: 10.2 mg/dL (ref 8.7–10.3)
Creatinine, Ser: 1.06 mg/dL — ABNORMAL HIGH (ref 0.57–1.00)
GFR calc Af Amer: 66 mL/min/{1.73_m2} (ref >59)
GFR calc non Af Amer: 57 mL/min/{1.73_m2} — ABNORMAL LOW (ref >59)
GLUCOSE: 90 mg/dL (ref 65–99)
POTASSIUM: 4 mmol/L (ref 3.5–5.2)
SODIUM: 143 mmol/L (ref 134–144)

## 2015-12-07 LAB — HEPATITIS C ANTIBODY

## 2015-12-07 NOTE — Assessment & Plan Note (Signed)
Patient interested in Hep C testing. Sent for Ab, Hep C negative.

## 2015-12-07 NOTE — Assessment & Plan Note (Signed)
Cr stable, 1.06.

## 2015-12-07 NOTE — Assessment & Plan Note (Signed)
Patient with bilateral shoulder and leg pain. Has been receiving Norco 10-325 q6h prn #120 monthly for some time. Scheduled to meet with new PCP in 1-2 weeks.  -Refill Norco for now, will discuss pain contract, etc. with Dr. Lynnae January at next clinic visit.

## 2015-12-07 NOTE — Progress Notes (Signed)
Medicine attending: Medical history, presenting problems, physical findings, and medications, reviewed with resident physician Dr Eden Jones on the day of the patient visit and I concur with his evaluation and management plan. 

## 2015-12-07 NOTE — Assessment & Plan Note (Signed)
BP Readings from Last 3 Encounters:  12/06/15 137/79  12/02/15 130/87  08/09/15 125/78    Lab Results  Component Value Date   NA 143 12/06/2015   K 4.0 12/06/2015   CREATININE 1.06* 12/06/2015    Assessment: Blood pressure control:   Controlled Comments: Taking Triamterene HCTZ 37.5-25  Plan: Medications:  continue current medications Other plans: RTC in 1-2 weeks to see PCP

## 2015-12-07 NOTE — Assessment & Plan Note (Addendum)
Had very long discussion about bisphosphonate therapy today. Discussed findings of her most recent bone density, significant for osteopenia in lumbar spine (T-score -2.2), and osteoporosis in the femur (T-score -2.8). Patient has had significant reservations with regards to using bisphosphonate therapy, mostly involving the small risk of osteonecrosis of the jaw. Discussed this risk (<1% of patients) and explained that given her need to use a walker and risk of spontaneous fracture, benefits of alendronate outweighs the risks. Patient agreed to start. -Alendronate 70 mg once weekly -Explained importance of drinking with plenty of water given possibility of pill esophagitis.  -RTC in 1-2 weeks to see PCP -Given patient's concern for osteonecrosis, suggested follow up with dentist in next few months

## 2015-12-13 ENCOUNTER — Encounter: Payer: Self-pay | Admitting: Internal Medicine

## 2015-12-13 ENCOUNTER — Ambulatory Visit (INDEPENDENT_AMBULATORY_CARE_PROVIDER_SITE_OTHER): Payer: Medicare Other | Admitting: Internal Medicine

## 2015-12-13 VITALS — BP 133/82 | HR 79 | Temp 98.1°F | Wt 158.7 lb

## 2015-12-13 DIAGNOSIS — M810 Age-related osteoporosis without current pathological fracture: Secondary | ICD-10-CM | POA: Diagnosis not present

## 2015-12-13 DIAGNOSIS — N183 Chronic kidney disease, stage 3 unspecified: Secondary | ICD-10-CM

## 2015-12-13 DIAGNOSIS — Z602 Problems related to living alone: Secondary | ICD-10-CM

## 2015-12-13 DIAGNOSIS — G8929 Other chronic pain: Secondary | ICD-10-CM | POA: Insufficient documentation

## 2015-12-13 DIAGNOSIS — Z0000 Encounter for general adult medical examination without abnormal findings: Secondary | ICD-10-CM

## 2015-12-13 DIAGNOSIS — I129 Hypertensive chronic kidney disease with stage 1 through stage 4 chronic kidney disease, or unspecified chronic kidney disease: Secondary | ICD-10-CM | POA: Diagnosis not present

## 2015-12-13 DIAGNOSIS — G809 Cerebral palsy, unspecified: Secondary | ICD-10-CM

## 2015-12-13 DIAGNOSIS — E785 Hyperlipidemia, unspecified: Secondary | ICD-10-CM | POA: Diagnosis not present

## 2015-12-13 DIAGNOSIS — Z8669 Personal history of other diseases of the nervous system and sense organs: Secondary | ICD-10-CM

## 2015-12-13 DIAGNOSIS — G800 Spastic quadriplegic cerebral palsy: Secondary | ICD-10-CM

## 2015-12-13 DIAGNOSIS — F1120 Opioid dependence, uncomplicated: Secondary | ICD-10-CM

## 2015-12-13 DIAGNOSIS — Z79891 Long term (current) use of opiate analgesic: Secondary | ICD-10-CM

## 2015-12-13 DIAGNOSIS — Z79899 Other long term (current) drug therapy: Secondary | ICD-10-CM

## 2015-12-13 DIAGNOSIS — M25511 Pain in right shoulder: Secondary | ICD-10-CM

## 2015-12-13 DIAGNOSIS — K219 Gastro-esophageal reflux disease without esophagitis: Secondary | ICD-10-CM

## 2015-12-13 DIAGNOSIS — I10 Essential (primary) hypertension: Secondary | ICD-10-CM

## 2015-12-13 DIAGNOSIS — M25512 Pain in left shoulder: Principal | ICD-10-CM

## 2015-12-13 MED ORDER — HYDROCODONE-ACETAMINOPHEN 10-325 MG PO TABS: 1.0000 | ORAL_TABLET | Freq: Four times a day (QID) | ORAL | Status: DC | PRN

## 2015-12-13 NOTE — Assessment & Plan Note (Signed)
She took her first alendronate dose on the 20th and tolerated it well. Plans to cont therapy. Explained unlikely to be cause of pain.  PLAN:  Cont current meds

## 2015-12-13 NOTE — Assessment & Plan Note (Signed)
I updated the overview. Pls refer to it.  Plan Cont current therapy

## 2015-12-13 NOTE — Assessment & Plan Note (Signed)
We reviewed her Cr trend. Explained that is stage three but barely and if Cr stays stable, will be OK. Seems to be tolerating naproxen QD and reviewed need to be cautious about NSDAID's and listed all of the NSAIDs.  PLAN : Ok to cont naproxen QD. BMP next appt. No other NSAIDs.

## 2015-12-13 NOTE — Assessment & Plan Note (Signed)
She is on Atorva 10 and her last LDL was 79 in 10/15. This is for primary prevention. 10 yr risk is 3.6 % therefore, her therapy is adequate.  PLAN:  Cont current meds

## 2015-12-13 NOTE — Assessment & Plan Note (Signed)
Her pain includes (in pt's words) bone spur on her ankle, knot on foot, arthritis all over, R shoulder pain occ when reaches over head, back pain, B leg pain and muscle aches. She does have CP and some limited ROM (RUE) and inversion of her L ankle / foot. She was seen in 2014 by sports med for subacromial impingement of her R shoulder. She did have R THR.   She has contract 2014 for 120 hydrocodone 10 mg. She takes 4 a day, never more / never less. She takes them when in pain / not according to time of day. She has not been able to go a full day without a pill. She spaces them out throughout day. They do no cause constipation, sedation, unsteady gait.  They allow her to walk to the grocery store and pharmacy, They allow her to live independently. They allow her to go to church and participate in church social activites and to go to weekly dinner with friends. When she is in pain, she in unable to be happy / enjoy activities, and the hydrocodone allow her to enjoy activities.  Overall the benefits outweigh the risks. Low concern of misuse / abuse.  PLAN:  Cont current meds UDS next appt Contract signed today Three paper copies handed to pt appts Q 3 months

## 2015-12-13 NOTE — Assessment & Plan Note (Signed)
On PPI QD. If skips, gets heartburn. Had failed Rolaids.  PLAN:  Cont current meds

## 2015-12-13 NOTE — Assessment & Plan Note (Addendum)
Gave her her Hep C results. Prefers not to get PAP bc difficult for her to get in position, all prior nl, and she states she is low risk. Gets stool cards instead of colon bc doesn't like to drink fluids. Poor diet - egg, bread and coffee for breakfast. Doesn't always have lunch - if does PB sandwich. Dinner is TV dinner.  Thin skin - consider checking TSH, micro, alb next appt  Skin scab L lower leg - no cellulitis. Bc she cannot see area, will see if can link with [facility]

## 2015-12-13 NOTE — Assessment & Plan Note (Signed)
This was 2 yrs ago and lasted all day long for a few days. Caused vomiting. Now resolved.

## 2015-12-13 NOTE — Assessment & Plan Note (Signed)
She is on maxzide 37.5 / 25 and her BP is at goal. No side effects. BP Readings from Last 3 Encounters:  12/13/15 133/82  12/06/15 137/79  12/02/15 130/87   PLAN:  Cont current meds

## 2015-12-13 NOTE — Patient Instructions (Signed)
1. I gave you three paper copies of your pain medicine 2. See me in May or June 3. I am referring you to [facility] 4. I will get blood work at your next appt

## 2015-12-13 NOTE — Progress Notes (Signed)
   Subjective:    Patient ID: Kathy Owens, female    DOB: Apr 18, 1956, 60 y.o.   MRN: [id number]  HPI  Kathy Owens is here for chronic pain. Please see the A&P for the status of the pt's chronic medical problems.  ROS : per ROS section and in problem oriented charting. All other systems are negative. PMHx, Soc hx, and / or Fam hx : She lives alone, never married, no pets. Has sister with cat. Able to walk to pharmacy and to stores. Sister comes 2 imes per week to visit. Sister or friends drive her to appts. Lives in complex for elderly and disabled and apartment without stairs. Enjoys going to church, weekly dinner with friends, and church social activities.    Review of Systems  Constitutional: Positive for fatigue. Negative for activity change and unexpected weight change.  Gastrointestinal: Negative for vomiting, constipation and blood in stool.  Genitourinary: Negative for vaginal bleeding.  Musculoskeletal: Positive for myalgias, back pain, arthralgias and gait problem.  Skin: Positive for wound. Negative for rash.  Neurological: Negative for dizziness and light-headedness.  Hematological: Does not bruise/bleed easily.       Objective:   Physical Exam  Constitutional: She appears well-developed and well-nourished. No distress.  HENT:  Head: Normocephalic and atraumatic.  Right Ear: External ear normal.  Left Ear: External ear normal.  Nose: Nose normal.  Eyes: EOM are normal. Right eye exhibits no discharge. Left eye exhibits no discharge.  Neck: Neck supple. No thyromegaly present.  Skin fold under chin, ? firmness  Cardiovascular: Normal rate, regular rhythm and normal heart sounds.   Pulmonary/Chest: Effort normal and breath sounds normal.  Abdominal: Soft. Bowel sounds are normal.  Musculoskeletal: She exhibits edema. She exhibits no tenderness.  Lymphadenopathy:    She has no cervical adenopathy.  Neurological: She is alert. Coordination abnormal.  Skin: Skin  is warm and dry. She is not diaphoretic.  Very thin, few scratches / abrasions. No ecchymosis. Linear cat scratch R knee. L lower leg, medial - 3 mm black scab with 2 mm surrounding erythema. Skin slightly pick but no difference from R leg. No warmth. No D/C. No cellulitis.  Psychiatric: She has a normal mood and affect. Her behavior is normal. Judgment and thought content normal.          Assessment & Plan:

## 2015-12-18 ENCOUNTER — Other Ambulatory Visit: Payer: Self-pay | Admitting: Licensed Clinical Social Worker

## 2015-12-18 DIAGNOSIS — N183 Chronic kidney disease, stage 3 unspecified: Secondary | ICD-10-CM

## 2015-12-21 ENCOUNTER — Other Ambulatory Visit: Payer: Self-pay

## 2015-12-21 NOTE — Patient Outreach (Signed)
[place] [place] [facility]) Care Management  12/21/2015  Kathy Owens 09-22-1956 [id number]  Referral Date:  12/18/2015 Source:  MD Issue:  Request for RN CM to visit and look at left lower leg.  Patient unable to see leg and lives alone; just a scab but thin skin and not bad enough to be eligible for [facility] wound care.   Outreach call #1 to patient for screening.  Patient not reached following busy tone x 3 attempts.  RN CM scheduled for next outreach call within one week.   [name], RN, BSN, [facility], CCM  [employer] Management Coordinator [phone] Direct [phone] Cell [phone] Office [phone] Fax

## 2015-12-24 ENCOUNTER — Ambulatory Visit: Payer: Self-pay

## 2015-12-25 ENCOUNTER — Other Ambulatory Visit: Payer: Self-pay

## 2015-12-25 DIAGNOSIS — I10 Essential (primary) hypertension: Secondary | ICD-10-CM

## 2015-12-25 NOTE — Patient Outreach (Signed)
[place] [facility]) Care Management  12/25/2015  Kathy Owens 1956/05/17 [id number]   Referral Date: 12/18/2015 Source: MD Issue: Request for RN CM to visit and look at left lower leg. Patient unable to see leg and lives alone; just a scab but thin skin and not bad enough to be eligible for [facility] wound care.   Outreach call #2 to patient for screening. Patient not reached. RN CM left HIPAA compliant voice message with name and call back #. RN CM scheduled for next outreach call within one week.   [name], RN, BSN, [facility], CCM  [employer] Management Coordinator [phone] Direct [phone] Cell [phone] Office [phone] Fax

## 2015-12-25 NOTE — Patient Outreach (Signed)
[place] [facility]) Care Management  [place]  12/25/2015   Colleen K Martinique 09-24-1956 [id number]   Referral Date: 12/18/2015 Source: MD Issue: Request for RN CM to visit and look at left lower leg. Patient unable to see leg and lives alone; just a scab but thin skin and not bad enough to be eligible for [facility] wound care.   Subjective:  Patient confirms wound back of leg.  States she thinks the wound started from scratching her leg.  States she wears compression stockings and noted a bloody spot after removing her stockings.   Social: Patient lives in her home alone.  Mobility: Ambulates with walker Falls: none  Transportation:  friends Caregiver: sister if needed  DME: walker   Medications:  Patient taking less than 10 medications  Co-pay cost issues: none  Flu Vaccine: 08/09/15   Current Medications:  Current Outpatient Prescriptions  Medication Sig Dispense Refill  . alendronate (FOSAMAX) 70 MG tablet Take 1 tablet (70 mg total) by mouth every 7 (seven) days. Take with a full glass of water on an empty stomach. 4 tablet 11  . aspirin 81 MG EC tablet Take 1 tablet (81 mg total) by mouth daily. 30 tablet 11  . atorvastatin (LIPITOR) 10 MG tablet Take 1 tablet (10 mg total) by mouth daily. 90 tablet 3  . calcium-vitamin D (OSCAL WITH D) 500-200 MG-UNIT per tablet Take 1 tablet by mouth 2 (two) times daily. 60 tablet 11  . diphenhydrAMINE (BENADRYL) 25 mg capsule Take 1 capsule (25 mg total) by mouth every 6 (six) hours as needed (dizziness). 30 capsule 0  . HYDROcodone-acetaminophen (NORCO) 10-325 MG tablet Take 1 tablet by mouth every 6 (six) hours as needed for severe pain (for pain). 120 tablet 0  . mupirocin ointment (BACTROBAN) 2 % Apply to skin bid 22 g 0  . naproxen (NAPROSYN) 500 MG tablet Take 1 tablet (500 mg total) by mouth 2 (two) times daily with a meal. (Patient taking differently: Take 500 mg by mouth daily. ) 30 tablet 3  . omeprazole  (PRILOSEC) 20 MG capsule Take 1 capsule (20 mg total) by mouth daily. 90 capsule 3  . triamterene-hydrochlorothiazide (MAXZIDE-25) 37.5-25 MG per tablet Take 1 tablet by mouth daily. 90 tablet 3   No current facility-administered medications for this visit.    Functional Status:  In your present state of health, do you have any difficulty performing the following activities: 12/13/2015 12/06/2015  Hearing? N N  Vision? N N  Difficulty concentrating or making decisions? N N  Walking or climbing stairs? Y N  Dressing or bathing? N N  Doing errands, shopping? N N    Fall/Depression Screening: PHQ 2/9 Scores 12/25/2015 12/13/2015 12/06/2015 08/09/2015 05/10/2015 08/25/2014 08/14/2014  PHQ - 2 Score 0 0 0 0 0 0 0    Plan:  [place] RN CM referral  -MD Request for RN CM to visit and look at left lower leg. Patient unable to see leg and lives alone; just a scab but thin skin and not bad enough to be eligible for [facility] wound care.   RN CM advised in next [facility] scheduled contact call within the next 10 business days.  RN CM advised to please notify MD of any changes in condition prior to scheduled appt's.   RN CM provided contact name and # [phone] or main office # [phone] and 24-hour nurse line # [phone].  RN CM confirmed patient is aware of 911 services for urgent  emergency needs.  [name], RN, BSN, [facility], CCM  [employer] Management Coordinator [phone] Direct [phone] Cell [phone] Office [phone] Fax

## 2015-12-26 ENCOUNTER — Telehealth: Payer: Self-pay | Admitting: *Deleted

## 2015-12-26 ENCOUNTER — Ambulatory Visit: Payer: Self-pay

## 2015-12-27 NOTE — Telephone Encounter (Signed)
review 

## 2015-12-31 ENCOUNTER — Other Ambulatory Visit: Payer: Self-pay

## 2015-12-31 NOTE — Patient Outreach (Signed)
[place] [facility]) Care Management  12/26/2015  Kathy Owens 07/30/1956 [id number]  Care Management Services does not make home visit to solely check skin/scab wound. Case discussed with [name], telephonic care coordinator. After reviewing the chart and discussing with telephonic care coordinator, no care management needs identified. RNCM called and spoke with nurse [name]-No care management needs identified at this time. Per [name], she will discuss with primary care and follow up with [name] for more appropriate referral to [facility].   Case Not opened.  [name], RN, MSN, [place] Coordinator Cell: [phone]

## 2016-01-22 DIAGNOSIS — H6982 Other specified disorders of Eustachian tube, left ear: Secondary | ICD-10-CM | POA: Diagnosis not present

## 2016-01-22 DIAGNOSIS — H6121 Impacted cerumen, right ear: Secondary | ICD-10-CM | POA: Diagnosis not present

## 2016-01-22 DIAGNOSIS — H9123 Sudden idiopathic hearing loss, bilateral: Secondary | ICD-10-CM | POA: Diagnosis not present

## 2016-01-30 DIAGNOSIS — H2513 Age-related nuclear cataract, bilateral: Secondary | ICD-10-CM | POA: Diagnosis not present

## 2016-02-07 DIAGNOSIS — H2511 Age-related nuclear cataract, right eye: Secondary | ICD-10-CM | POA: Diagnosis not present

## 2016-02-15 ENCOUNTER — Ambulatory Visit: Payer: Medicare Other | Admitting: Podiatry

## 2016-02-26 DIAGNOSIS — H2512 Age-related nuclear cataract, left eye: Secondary | ICD-10-CM | POA: Diagnosis not present

## 2016-03-20 ENCOUNTER — Ambulatory Visit: Payer: Self-pay | Admitting: Internal Medicine

## 2016-03-28 ENCOUNTER — Ambulatory Visit: Payer: Medicare Other | Admitting: Podiatry

## 2016-04-02 ENCOUNTER — Other Ambulatory Visit: Payer: Self-pay

## 2016-04-02 DIAGNOSIS — M25511 Pain in right shoulder: Secondary | ICD-10-CM

## 2016-04-02 DIAGNOSIS — M25512 Pain in left shoulder: Principal | ICD-10-CM

## 2016-04-02 MED ORDER — HYDROCODONE-ACETAMINOPHEN 10-325 MG PO TABS: 1.0000 | ORAL_TABLET | Freq: Four times a day (QID) | ORAL | Status: DC | PRN

## 2016-04-02 NOTE — Telephone Encounter (Signed)
Detailed need / benefit in Feb note. Need UDs next appt. Check on contract next appt. Database OK  Must keep July appt

## 2016-04-02 NOTE — Telephone Encounter (Signed)
Pt requesting hydrocodone to be filled.  °

## 2016-04-02 NOTE — Telephone Encounter (Signed)
LVM it is ready

## 2016-04-02 NOTE — Telephone Encounter (Signed)
3 rx given 2/23 at last OV 1 canceled apt 6/1 Future scheduled 7/20 UDS 08/2015

## 2016-04-25 ENCOUNTER — Ambulatory Visit (INDEPENDENT_AMBULATORY_CARE_PROVIDER_SITE_OTHER): Payer: Medicare Other | Admitting: Podiatry

## 2016-04-25 ENCOUNTER — Encounter: Payer: Self-pay | Admitting: Podiatry

## 2016-04-25 DIAGNOSIS — B351 Tinea unguium: Secondary | ICD-10-CM | POA: Diagnosis not present

## 2016-04-25 DIAGNOSIS — M79673 Pain in unspecified foot: Secondary | ICD-10-CM

## 2016-04-25 MED ORDER — NAPROXEN 500 MG PO TABS: 500.0000 mg | ORAL_TABLET | Freq: Two times a day (BID) | ORAL | Status: DC

## 2016-04-25 NOTE — Progress Notes (Signed)
Subjective:     Patient ID: Kathy Owens, female   DOB: [birth year], 60 y.o.   MRN: [id number]  HPIThis patient presents to office for preventive foot care services.  She has hx of CP and has long thick painful nails.  She has pain walking and wearing her shoes.   Review of Systems     Objective:   Physical Exam GENERAL APPEARANCE: Alert, conversant. Appropriately groomed. No acute distress.  VASCULAR: Pedal pulses palpable at  [facility] and PT bilateral.  Capillary refill time is immediate to all digits,  Normal temperature gradient.  Digital hair growth is present bilateral  NEUROLOGIC: sensation is normal to 5.07 monofilament at 5/5 sites bilateral.  Light touch is intact bilateral   DERMATOLOGIC: skin color, texture, and turgor are within normal limits.  No preulcerative lesions or ulcers  are seen, no interdigital maceration noted.  No open lesions present.   No drainage noted.[place] durum B/L NAILS  Thick disfigured discolored nails both feet  Orthopedic  Flattened foot profile right foot with navicular tuberosity.  STJ ROM minimal right foot. Severe HAV  B/L.  Hammer toes 5th B/L    Assessment:  Onychomycosis  B/L  PTTD right foot.    Plan:    Debridement of Nails both feet.  RTC 3 months  Naprosyn refill with 3 refills. Told her to use naprosyn as needed.   Gardiner Barefoot DPM

## 2016-05-08 ENCOUNTER — Ambulatory Visit (INDEPENDENT_AMBULATORY_CARE_PROVIDER_SITE_OTHER): Payer: Medicare Other | Admitting: Internal Medicine

## 2016-05-08 ENCOUNTER — Encounter: Payer: Self-pay | Admitting: Internal Medicine

## 2016-05-08 VITALS — BP 125/77 | HR 84 | Temp 98.6°F | Wt 153.9 lb

## 2016-05-08 DIAGNOSIS — K219 Gastro-esophageal reflux disease without esophagitis: Secondary | ICD-10-CM

## 2016-05-08 DIAGNOSIS — I129 Hypertensive chronic kidney disease with stage 1 through stage 4 chronic kidney disease, or unspecified chronic kidney disease: Secondary | ICD-10-CM | POA: Diagnosis not present

## 2016-05-08 DIAGNOSIS — M810 Age-related osteoporosis without current pathological fracture: Secondary | ICD-10-CM

## 2016-05-08 DIAGNOSIS — F1120 Opioid dependence, uncomplicated: Secondary | ICD-10-CM

## 2016-05-08 DIAGNOSIS — E785 Hyperlipidemia, unspecified: Secondary | ICD-10-CM

## 2016-05-08 DIAGNOSIS — N183 Chronic kidney disease, stage 3 unspecified: Secondary | ICD-10-CM

## 2016-05-08 DIAGNOSIS — Z0000 Encounter for general adult medical examination without abnormal findings: Secondary | ICD-10-CM

## 2016-05-08 DIAGNOSIS — I10 Essential (primary) hypertension: Secondary | ICD-10-CM

## 2016-05-08 MED ORDER — OMEPRAZOLE 20 MG PO CPDR: 20.0000 mg | DELAYED_RELEASE_CAPSULE | Freq: Every day | ORAL | Status: DC

## 2016-05-08 MED ORDER — ATORVASTATIN CALCIUM 10 MG PO TABS: 10.0000 mg | ORAL_TABLET | Freq: Every day | ORAL | Status: DC

## 2016-05-08 MED ORDER — TRIAMTERENE-HCTZ 37.5-25 MG PO TABS: 1.0000 | ORAL_TABLET | Freq: Every day | ORAL | Status: DC

## 2016-05-08 NOTE — Assessment & Plan Note (Signed)
Had cataract surgery B. Seemed to develop light sensitivity and visual impairment afterwards. States OK when uses sunglasses.   Uses imodium frq for loose stool but pt states one stool per day and Ok.  Said podiatry dx her with a "yoke". Another podiatrist dx her with OA. States told only thing could be done was surgery and she is not interested. I reviewed podiatry notes and found dx of Posterior tibial tendon dysfxn and navicular tuberosity.

## 2016-05-08 NOTE — Assessment & Plan Note (Signed)
No sxs on PPI. No med SE.  PLAN:  Cont current meds

## 2016-05-08 NOTE — Assessment & Plan Note (Signed)
Takes her atorva 10 (moderate) for primary prevention. LDL 79 in 2015. Her 10 yr Framingham risk is about 6.5 - 7%. She is having no SE. Will discuss risks / benefits of cont vs stopping next appt. The new guidelines are not clear for someone like her - either moderate or discuss. Due to her current sxs of CP, a stroke could be disabling and take away her independence. But she is also taking a med that is not entirely benign.

## 2016-05-08 NOTE — Assessment & Plan Note (Signed)
Tolerating the alendronate quite well. Also on calcium and Vit D.  PLAN:  Cont current meds

## 2016-05-08 NOTE — Assessment & Plan Note (Signed)
I reviewed her cr curve with her. Taking naproxen daily for quite some time and cr remained stable despite this.   PLAN : cont to follow Cr

## 2016-05-08 NOTE — Assessment & Plan Note (Signed)
BP great on Maxzide 37.5/25. No SE.  PLAN:  Cont current meds   BP Readings from Last 3 Encounters:  05/08/16 125/77  12/13/15 133/82  12/06/15 137/79

## 2016-05-08 NOTE — Patient Instructions (Signed)
1. You have paper scripts for your pain pills until mid sep.  2. See me sep or October  3. Your liver is doing well and your kidneys are stable.

## 2016-05-08 NOTE — Assessment & Plan Note (Signed)
I inherited pt on hydrocodone 10 mg #120 per month. Takes 3 or 4 daily. Took 4 yesterday. Allows her to walk to pharmacy, go to church, and out to dinner with friends. No adverse side effects. No red / yellow flags. [place] database OK. Needs UDS - I did not get today bc did not order any other labs. I printed off three scripts in June - will fill in sept. I think the benefits of the opioid outweigh the risks.  PLAN:  Cont current meds

## 2016-05-08 NOTE — Progress Notes (Signed)
   Subjective:    Patient ID: Kathy Owens, female    DOB: October 03, 1956, 60 y.o.   MRN: [id number]  HPI  Kathy Owens is here for pain F/U. Please see the A&P for the status of the pt's chronic medical problems.  ROS : per ROS section and in problem oriented charting. All other systems are negative.  PMHx, Soc hx, and / or Fam hx : PMHx inc HTN, CKD, HLD, GERD, CP, chronic pain. Social hx : goes to church and out to dinner with friends.  Review of Systems  Constitutional: Negative for activity change, appetite change, fatigue and unexpected weight change.  HENT: Negative for congestion and rhinorrhea.   Eyes: Positive for visual disturbance.       + light sensitivity.  Cardiovascular: Positive for leg swelling.  Gastrointestinal: Negative for constipation and blood in stool.       One stool daily. Sometimes loose. No GERD sxs  Musculoskeletal: Positive for myalgias, back pain, arthralgias and gait problem.  Psychiatric/Behavioral: Negative for dysphoric mood.       No excessive sleepiness        Objective:   Physical Exam  Constitutional: She is oriented to person, place, and time. She appears well-developed and well-nourished. No distress.  HENT:  Head: Normocephalic.  Right Ear: External ear normal.  Left Ear: External ear normal.  Nose: Nose normal.  Eyes: Conjunctivae and EOM are normal.  Cardiovascular: Normal rate, regular rhythm and normal heart sounds.  Exam reveals no friction rub.   No murmur heard. Musculoskeletal: Normal range of motion. She exhibits edema.  Compression sticking L leg. + 1 edema B  Neurological: She is alert and oriented to person, place, and time.  Skin: Skin is warm and dry. She is not diaphoretic.  Psychiatric: She has a normal mood and affect. Her behavior is normal. Judgment and thought content normal.          Assessment & Plan:

## 2016-06-25 ENCOUNTER — Telehealth: Payer: Self-pay | Admitting: Internal Medicine

## 2016-06-25 NOTE — Telephone Encounter (Signed)
APT. REMINDER CALL, NO ANSWER °

## 2016-06-26 ENCOUNTER — Ambulatory Visit (INDEPENDENT_AMBULATORY_CARE_PROVIDER_SITE_OTHER): Payer: Medicare Other | Admitting: Internal Medicine

## 2016-06-26 ENCOUNTER — Encounter: Payer: Self-pay | Admitting: Internal Medicine

## 2016-06-26 VITALS — BP 132/104 | HR 83 | Temp 97.6°F | Ht 60.0 in | Wt 152.0 lb

## 2016-06-26 DIAGNOSIS — Z79891 Long term (current) use of opiate analgesic: Secondary | ICD-10-CM

## 2016-06-26 DIAGNOSIS — R6889 Other general symptoms and signs: Secondary | ICD-10-CM

## 2016-06-26 DIAGNOSIS — M25512 Pain in left shoulder: Secondary | ICD-10-CM

## 2016-06-26 DIAGNOSIS — R42 Dizziness and giddiness: Secondary | ICD-10-CM

## 2016-06-26 DIAGNOSIS — I10 Essential (primary) hypertension: Secondary | ICD-10-CM

## 2016-06-26 DIAGNOSIS — E785 Hyperlipidemia, unspecified: Secondary | ICD-10-CM | POA: Diagnosis not present

## 2016-06-26 DIAGNOSIS — N183 Chronic kidney disease, stage 3 unspecified: Secondary | ICD-10-CM

## 2016-06-26 DIAGNOSIS — Z23 Encounter for immunization: Secondary | ICD-10-CM | POA: Diagnosis not present

## 2016-06-26 DIAGNOSIS — F1120 Opioid dependence, uncomplicated: Secondary | ICD-10-CM

## 2016-06-26 DIAGNOSIS — Z0000 Encounter for general adult medical examination without abnormal findings: Secondary | ICD-10-CM

## 2016-06-26 DIAGNOSIS — M25511 Pain in right shoulder: Secondary | ICD-10-CM

## 2016-06-26 DIAGNOSIS — I129 Hypertensive chronic kidney disease with stage 1 through stage 4 chronic kidney disease, or unspecified chronic kidney disease: Secondary | ICD-10-CM | POA: Diagnosis not present

## 2016-06-26 DIAGNOSIS — Z79899 Other long term (current) drug therapy: Secondary | ICD-10-CM

## 2016-06-26 DIAGNOSIS — K219 Gastro-esophageal reflux disease without esophagitis: Secondary | ICD-10-CM

## 2016-06-26 MED ORDER — HYDROCODONE-ACETAMINOPHEN 10-325 MG PO TABS: 1.0000 | ORAL_TABLET | Freq: Four times a day (QID) | ORAL | 0 refills | Status: DC | PRN

## 2016-06-26 NOTE — Progress Notes (Signed)
   Subjective:    Patient ID: Kathy Owens, female    DOB: December 01, 1955, 60 y.o.   MRN: [id number]  HPI  Kathy Owens is here for chronic pain and new dizziness. Please see the A&P for the status of the pt's chronic medical problems.  ROS : per ROS section and in problem oriented charting. All other systems are negative.  PMHx, Soc hx, and / or Fam hx : Pt lives in subsidized housing for elderly and disabled. Sister left husband. Can't afford housing and wants pt to leave her apartment and move in with her but that is not a stable situation as pt knows sister will eventually get new man.  Review of Systems  Constitutional: Negative for activity change, appetite change and unexpected weight change.  Eyes: Negative for visual disturbance.  Respiratory: Negative for shortness of breath.   Cardiovascular: Negative for chest pain.  Gastrointestinal: Negative for constipation and vomiting.  Genitourinary: Negative for decreased urine volume and difficulty urinating.  Neurological: Positive for dizziness, light-headedness and numbness.       No vertigo  Psychiatric/Behavioral: Positive for sleep disturbance. Negative for dysphoric mood.       Objective:   Physical Exam  Constitutional: She is oriented to person, place, and time. She appears well-developed and well-nourished. No distress.  HENT:  Head: Normocephalic and atraumatic.  Right Ear: External ear normal.  Left Ear: External ear normal.  Nose: Nose normal.  Eyes: Conjunctivae and EOM are normal.  Cardiovascular: Normal rate, regular rhythm and normal heart sounds.   No murmur heard. Pulmonary/Chest: Effort normal.  Abdominal: Soft. Bowel sounds are normal.  Neurological: She is alert and oriented to person, place, and time.  Skin: Skin is warm and dry. She is not diaphoretic.  Skin pale. Thin. No rashes  Psychiatric: She has a normal mood and affect. Her behavior is normal. Judgment and thought content normal.           Assessment & Plan:

## 2016-06-26 NOTE — Patient Instructions (Signed)
1. I gave you 3 paper copies of your pain medicine 2. I will either call you or mail you your test results 3. Come back sooner if the dizziness gets worse

## 2016-06-27 ENCOUNTER — Encounter: Payer: Self-pay | Admitting: Internal Medicine

## 2016-06-27 LAB — CMP14 + ANION GAP
ALK PHOS: 41 IU/L (ref 39–117)
ALT: 15 IU/L (ref 0–32)
AST: 20 IU/L (ref 0–40)
Albumin/Globulin Ratio: 2 (ref 1.2–2.2)
Albumin: 4.5 g/dL (ref 3.6–4.8)
Anion Gap: 20 mmol/L — ABNORMAL HIGH (ref 10.0–18.0)
BUN/Creatinine Ratio: 25 (ref 12–28)
BUN: 29 mg/dL — AB (ref 8–27)
Bilirubin Total: 0.5 mg/dL (ref 0.0–1.2)
CALCIUM: 9.7 mg/dL (ref 8.7–10.3)
CO2: 20 mmol/L (ref 18–29)
CREATININE: 1.18 mg/dL — AB (ref 0.57–1.00)
Chloride: 101 mmol/L (ref 96–106)
GFR calc Af Amer: 58 mL/min/{1.73_m2} — ABNORMAL LOW (ref >59)
GFR calc non Af Amer: 50 mL/min/{1.73_m2} — ABNORMAL LOW (ref >59)
Globulin, Total: 2.3 g/dL (ref 1.5–4.5)
Glucose: 85 mg/dL (ref 65–99)
Potassium: 3.7 mmol/L (ref 3.5–5.2)
Sodium: 141 mmol/L (ref 134–144)
Total Protein: 6.8 g/dL (ref 6.0–8.5)

## 2016-06-27 LAB — TSH: TSH: 3.82 u[IU]/mL (ref 0.450–4.500)

## 2016-06-27 LAB — CBC
HEMATOCRIT: 38.3 % (ref 34.0–46.6)
HEMOGLOBIN: 12.8 g/dL (ref 11.1–15.9)
MCH: 28.6 pg (ref 26.6–33.0)
MCHC: 33.4 g/dL (ref 31.5–35.7)
MCV: 86 fL (ref 79–97)
PLATELETS: 235 10*3/uL (ref 150–379)
RBC: 4.47 x10E6/uL (ref 3.77–5.28)
RDW: 14.6 % (ref 12.3–15.4)
WBC: 5.8 10*3/uL (ref 3.4–10.8)

## 2016-06-27 NOTE — Assessment & Plan Note (Signed)
No sxs on PPI QD  PLAN:  Cont current meds

## 2016-06-27 NOTE — Assessment & Plan Note (Signed)
On atorva 10 for primary prevention. No SE.  PLAN:  Cont current meds

## 2016-06-27 NOTE — Assessment & Plan Note (Signed)
Cr stable despite daily use of NSAID.  PLAN : BMP today

## 2016-06-27 NOTE — Assessment & Plan Note (Signed)
Wants stool cards.   Got flu shot today.  Dizzy which occurs anytime - not just with standing. No vertigo like prior - vision remains nl. Lasts one hr or longer. No vomiting. Eating well. Drinking lots of water - urine clear. No CP, SOB. Sleep Ok.. C/O arm falling asleep when sleeping and using walker but that is getting better. Not certain what is causing the dizziness - clearly not her normal vertigo, doubt vol contraction as drinking well and not just when she stands. Need to check labs as could be hyponatremic or anemic. (Labs OK)  PLAN : close F/U

## 2016-06-27 NOTE — Assessment & Plan Note (Signed)
On maxide QD. BP diastolic a bit high today but had always been controlled in past.   PLAN:  Cont current meds   BP Readings from Last 3 Encounters:  06/26/16 (!) 132/104  05/08/16 125/77  12/13/15 133/82

## 2016-06-27 NOTE — Assessment & Plan Note (Signed)
On hydrocodone 3120 per month. Takes about 4 per day, every day. Allows her to walk to pharmacy, go to church, and out with friends. No constipation or other SE. Low concern for abuse / misuse.  PLAN : UDS Gave 3 paper copies

## 2016-07-04 LAB — TOXASSURE SELECT,+ANTIDEPR,UR

## 2016-07-22 ENCOUNTER — Ambulatory Visit: Payer: Medicare Other | Admitting: Podiatry

## 2016-08-08 ENCOUNTER — Ambulatory Visit: Payer: Medicare Other | Admitting: Podiatry

## 2016-08-15 ENCOUNTER — Encounter: Payer: Self-pay | Admitting: Student in an Organized Health Care Education/Training Program

## 2016-08-31 ENCOUNTER — Other Ambulatory Visit: Payer: Self-pay | Admitting: Podiatry

## 2016-09-17 ENCOUNTER — Telehealth: Payer: Self-pay

## 2016-09-17 NOTE — Telephone Encounter (Signed)
Please call pt back regarding meds.  

## 2016-09-17 NOTE — Telephone Encounter (Signed)
Pt calls and is concerned about class action lawsuit r/t omeprazole, states she does not is she should continue to take it, she is advised to discuss concerns w/ pcp in jan at her appt also that it is her choice to take the med or any med or not to continue. She is agreeable w/ this and states she will do so in [place]

## 2016-09-25 ENCOUNTER — Ambulatory Visit: Payer: Self-pay | Admitting: Internal Medicine

## 2016-10-02 ENCOUNTER — Other Ambulatory Visit: Payer: Self-pay

## 2016-10-02 DIAGNOSIS — M25511 Pain in right shoulder: Secondary | ICD-10-CM

## 2016-10-02 DIAGNOSIS — M25512 Pain in left shoulder: Principal | ICD-10-CM

## 2016-10-02 MED ORDER — HYDROCODONE-ACETAMINOPHEN 10-325 MG PO TABS: 1.0000 | ORAL_TABLET | Freq: Four times a day (QID) | ORAL | 0 refills | Status: DC | PRN

## 2016-10-02 NOTE — Telephone Encounter (Signed)
rx ready for pick up-pt aware.Kathy Owens, Kathy Cab[place] Cassady12/14/201711:07 AM

## 2016-10-02 NOTE — Telephone Encounter (Signed)
Requesting pain med to be filled.  

## 2016-10-02 NOTE — Telephone Encounter (Signed)
Last office visit: 06/26/16 Last UDS: 06/26/16  Last Refill:  09/03/16 for #120 Next appt: 11/13/2016

## 2016-10-07 ENCOUNTER — Telehealth: Payer: Self-pay

## 2016-10-07 NOTE — Telephone Encounter (Signed)
Agree with plan.  She will need to come in if any red flag symptoms for a head CT.  She is only on a baby aspirin from what I can see, however, with enough impact she could develop a Subdural hematoma.  Thanks.

## 2016-10-07 NOTE — Telephone Encounter (Incomplete)
Agree with plan 

## 2016-10-07 NOTE — Telephone Encounter (Signed)
Patient called to report that she fell and bumped her head, she reports that she just has a place on forehead that looks like a carpet burn wants to know if she could be bleeding in the brain and how to treat wound. Advised patient there is no way to assess that over the phone encouraged her to come for appointment she states it is not possible due to transportation today or tomorrow . Patient is oriented to person place and time denies blurred vision, headache, bruising to head. Advised patient to clean abrasion and apply neosporin call 911 if she has any symptoms discussed above.

## 2016-10-14 ENCOUNTER — Other Ambulatory Visit: Payer: Self-pay | Admitting: Internal Medicine

## 2016-10-14 DIAGNOSIS — M810 Age-related osteoporosis without current pathological fracture: Secondary | ICD-10-CM

## 2016-10-16 ENCOUNTER — Other Ambulatory Visit: Payer: Self-pay

## 2016-10-16 DIAGNOSIS — M8000XA Age-related osteoporosis with current pathological fracture, unspecified site, initial encounter for fracture: Secondary | ICD-10-CM

## 2016-10-16 NOTE — Telephone Encounter (Signed)
alendronate (FOSAMAX) 70 MG tablet, Refill request.

## 2016-10-17 MED ORDER — ALENDRONATE SODIUM 70 MG PO TABS: 70.0000 mg | ORAL_TABLET | ORAL | 11 refills | Status: DC

## 2016-10-29 ENCOUNTER — Other Ambulatory Visit: Payer: Self-pay

## 2016-10-29 ENCOUNTER — Other Ambulatory Visit: Payer: Self-pay | Admitting: *Deleted

## 2016-10-29 DIAGNOSIS — M25511 Pain in right shoulder: Secondary | ICD-10-CM

## 2016-10-29 DIAGNOSIS — M25512 Pain in left shoulder: Principal | ICD-10-CM

## 2016-10-29 MED ORDER — HYDROCODONE-ACETAMINOPHEN 10-325 MG PO TABS: 1.0000 | ORAL_TABLET | Freq: Four times a day (QID) | ORAL | 0 refills | Status: DC | PRN

## 2016-10-29 NOTE — Telephone Encounter (Signed)
HYDROcodone-acetaminophen (NORCO) 10-325 MG tablet, refill request.

## 2016-10-31 NOTE — Telephone Encounter (Signed)
Rxs ready - pt called / informed. 

## 2016-11-04 NOTE — Telephone Encounter (Signed)
Spoke w/ pt, she made a mistake, she has all her scripts

## 2016-11-12 ENCOUNTER — Telehealth: Payer: Self-pay | Admitting: Internal Medicine

## 2016-11-12 NOTE — Telephone Encounter (Signed)
APT. REMINDER CALL, LMTCB °

## 2016-11-13 ENCOUNTER — Ambulatory Visit (INDEPENDENT_AMBULATORY_CARE_PROVIDER_SITE_OTHER): Payer: Medicare Other | Admitting: Internal Medicine

## 2016-11-13 ENCOUNTER — Encounter: Payer: Self-pay | Admitting: Internal Medicine

## 2016-11-13 VITALS — BP 136/88 | HR 82 | Temp 98.7°F | Wt 156.2 lb

## 2016-11-13 DIAGNOSIS — Z0000 Encounter for general adult medical examination without abnormal findings: Secondary | ICD-10-CM

## 2016-11-13 DIAGNOSIS — G808 Other cerebral palsy: Secondary | ICD-10-CM

## 2016-11-13 DIAGNOSIS — Z79899 Other long term (current) drug therapy: Secondary | ICD-10-CM

## 2016-11-13 DIAGNOSIS — M25511 Pain in right shoulder: Secondary | ICD-10-CM

## 2016-11-13 DIAGNOSIS — H8110 Benign paroxysmal vertigo, unspecified ear: Secondary | ICD-10-CM

## 2016-11-13 DIAGNOSIS — Z9181 History of falling: Secondary | ICD-10-CM

## 2016-11-13 DIAGNOSIS — M545 Low back pain, unspecified: Secondary | ICD-10-CM

## 2016-11-13 DIAGNOSIS — R531 Weakness: Secondary | ICD-10-CM

## 2016-11-13 DIAGNOSIS — E7800 Pure hypercholesterolemia, unspecified: Secondary | ICD-10-CM

## 2016-11-13 DIAGNOSIS — N183 Chronic kidney disease, stage 3 unspecified: Secondary | ICD-10-CM

## 2016-11-13 DIAGNOSIS — E785 Hyperlipidemia, unspecified: Secondary | ICD-10-CM

## 2016-11-13 DIAGNOSIS — G8929 Other chronic pain: Secondary | ICD-10-CM

## 2016-11-13 DIAGNOSIS — M25551 Pain in right hip: Secondary | ICD-10-CM

## 2016-11-13 DIAGNOSIS — I129 Hypertensive chronic kidney disease with stage 1 through stage 4 chronic kidney disease, or unspecified chronic kidney disease: Secondary | ICD-10-CM

## 2016-11-13 DIAGNOSIS — G809 Cerebral palsy, unspecified: Secondary | ICD-10-CM

## 2016-11-13 DIAGNOSIS — K219 Gastro-esophageal reflux disease without esophagitis: Secondary | ICD-10-CM

## 2016-11-13 DIAGNOSIS — I10 Essential (primary) hypertension: Secondary | ICD-10-CM

## 2016-11-13 DIAGNOSIS — F1120 Opioid dependence, uncomplicated: Secondary | ICD-10-CM

## 2016-11-13 DIAGNOSIS — Z79891 Long term (current) use of opiate analgesic: Secondary | ICD-10-CM

## 2016-11-13 DIAGNOSIS — M810 Age-related osteoporosis without current pathological fracture: Secondary | ICD-10-CM

## 2016-11-13 MED ORDER — RANITIDINE HCL 150 MG PO CAPS: 150.0000 mg | ORAL_CAPSULE | Freq: Two times a day (BID) | ORAL | 3 refills | Status: DC

## 2016-11-13 MED ORDER — NAPROXEN 500 MG PO TABS: 500.0000 mg | ORAL_TABLET | Freq: Two times a day (BID) | ORAL | 3 refills | Status: DC

## 2016-11-13 NOTE — Assessment & Plan Note (Signed)
This problem is chronic and stable. She has stage III chronic kidney disease and her kidney function has been stable since 2012 with a creatinine of about 1.2. This is despite using naproxen daily. The etiology is likely hypertension.  PLAN : Continue to follow Yearly creatinine Okay to continue nonsteroidal as long as kidney function stable

## 2016-11-13 NOTE — Assessment & Plan Note (Signed)
This problem is chronic and stable. She is on omeprazole daily. She hasn't tried going to omeprazole every other day and started experiencing some heartburn. When she resumed daily dosing her symptoms resolved. She has seen the association between PPI and kidney disease and would like to try to come off her omeprazole. She has never had an ulcer per EGD.Marland Kitchen   Plan : Omeprazole every other day for 2 weeks then stop. Start ranitidine 150 mg twice a day today. If her symptoms return she is to call me and we will resume her omeprazole.

## 2016-11-13 NOTE — Progress Notes (Signed)
   Subjective:    Patient ID: Kathy Owens, female    DOB: 07-31-1956, 61 y.o.   MRN: [id number]  HPI  Kathy Owens is here for LBP. Please see the A&P for the status of the pt's chronic medical problems.  ROS : per ROS section and in problem oriented charting. All other systems are negative.  PMHx, Soc hx, and / or Fam hx : Sister, Charleston Ropes, is now settled and not wanting her to move in. Not walking as much 2/2 light sens since eye surgery   Review of Systems  Constitutional: Negative for activity change.  Eyes: Positive for photophobia.  Gastrointestinal: Negative for abdominal pain.  Musculoskeletal: Positive for arthralgias, back pain and gait problem.  Neurological: Positive for weakness. Negative for dizziness, light-headedness and headaches.       Objective:   Physical Exam  Constitutional: She appears well-developed and well-nourished. No distress.  HENT:  Head: Normocephalic and atraumatic.  Right Ear: External ear normal.  Left Ear: External ear normal.  Nose: Nose normal.  Eyes: Conjunctivae and EOM are normal. Right eye exhibits no discharge. Left eye exhibits no discharge. No scleral icterus.  Cardiovascular: Normal rate, regular rhythm and normal heart sounds.   Abdominal: Soft. Bowel sounds are normal.  Musculoskeletal: She exhibits deformity. She exhibits no edema.  Skin: Skin is warm and dry. She is not diaphoretic.  Psychiatric: She has a normal mood and affect. Her behavior is normal. Judgment and thought content normal.          Assessment & Plan:

## 2016-11-13 NOTE — Patient Instructions (Signed)
1. Your pain meds will be due in April. Call me one week before you run out 2. See me in 3 months 3. I am having physical therapy come to your house 4. Take omeprazole every other day for 2 weeks then stop      Take rantidine twice a day every day starting today.      Call me if the new medicine doesn't work

## 2016-11-13 NOTE — Assessment & Plan Note (Signed)
This problem is chronic and worse. She is on chronic hydrocodone therapy for generalized pain including her right shoulder and right hip. She had a fall about a month ago and hit her head. About 2 weeks afterwards she started having low back pain across the lower back. It moved to the right hip laterally then the left hip laterally and then both hips. The pain is not causing her any gait problems above her baseline gait issues. She does have increased pain with ambulation. There is no radiculopathy. She sleeps on her back. She states her mattress is old and uncomfortable.  Assessment : Acute low back pain on top of chronic pain. She has documented osteoporosis and although the pain started 2 weeks after a fall I would like to rule out a lumbar compression fracture. Her symptoms are not consistent with a herniated disc. Her pain is not severe at this time and is not interfering with her gait.  Plan : Plain lumbar x-rays Home physical therapy Continue hydrocodone therapy with refill due in April Continue naproxen Return to clinic if no improvement

## 2016-11-13 NOTE — Assessment & Plan Note (Signed)
This problem is chronic and stable. She is on atorvastatin 10 mg for primary prevention. She has no side effects to the medication.  PLAN:  Cont current meds

## 2016-11-13 NOTE — Assessment & Plan Note (Signed)
This problem is chronic stable. She is on Fosamax, calcium, and vitamin D. With new low back pain I am ruling out a compression fracture with plain films.  PLAN:  Cont current meds Lumbar plain films

## 2016-11-13 NOTE — Assessment & Plan Note (Signed)
She refuses mammography and cervical cancer screening.

## 2016-11-13 NOTE — Assessment & Plan Note (Signed)
This problem is chronic and improved. She is not having bothersome symptoms like she did at her last appointment. She states about once a month or once every other normal she will get her vertigo. However she states that if she calms down, relaxes, and take salt that the symptoms will go away.  Assessment : Some of her symptoms sound more anxiety related. She is not hypotensive and symptoms are not brought on by standing down orthostatic hypotension.  Plan : Follow for now

## 2016-11-13 NOTE — Assessment & Plan Note (Signed)
This problem is chronic and worse. She states that she is now having difficulty standing up independently at times even while using her walker. She has grab bars around her toilet and has had no difficulty getting off her toilet so far. She has no fear of walking. She did have one fall a month ago but that was after slipping on her wood floors. The pain is not causing her inability to stand. She has used home physical therapy in the past and is willing to have another go at it.  PLAN : Referral for home physical therapy for strength training.

## 2016-11-13 NOTE — Assessment & Plan Note (Signed)
This problem is chronic and stable. She is on Maxzide daily. She has no side effects to this medication. Her diastolic is a bit elevated today but not so much that I plan to change her medications.  PLAN:  Cont current meds   BP Readings from Last 3 Encounters:  11/13/16 136/88  06/26/16 (!) 132/104  05/08/16 125/77

## 2016-11-17 ENCOUNTER — Telehealth: Payer: Self-pay

## 2016-11-17 DIAGNOSIS — M810 Age-related osteoporosis without current pathological fracture: Secondary | ICD-10-CM | POA: Diagnosis not present

## 2016-11-17 DIAGNOSIS — R269 Unspecified abnormalities of gait and mobility: Secondary | ICD-10-CM | POA: Diagnosis not present

## 2016-11-17 NOTE — Telephone Encounter (Signed)
Call to patient regarding home health referral patients states she does not have a preference is fine with referral to [facility]. Advised patient that Alvis Lemmings will contact her by phone to schedule appointment for services.

## 2016-11-18 ENCOUNTER — Ambulatory Visit (HOSPITAL_COMMUNITY)
Admission: RE | Admit: 2016-11-18 | Discharge: 2016-11-18 | Disposition: A | Discharge status: Home / Self Care | Payer: Medicare Other | Source: Ambulatory Visit | Attending: Internal Medicine | Admitting: Internal Medicine

## 2016-11-18 ENCOUNTER — Ambulatory Visit: Payer: Medicare Other | Admitting: Podiatry

## 2016-11-18 DIAGNOSIS — M47816 Spondylosis without myelopathy or radiculopathy, lumbar region: Secondary | ICD-10-CM | POA: Insufficient documentation

## 2016-11-18 DIAGNOSIS — I708 Atherosclerosis of other arteries: Secondary | ICD-10-CM | POA: Diagnosis not present

## 2016-11-18 DIAGNOSIS — S3992XA Unspecified injury of lower back, initial encounter: Secondary | ICD-10-CM | POA: Diagnosis not present

## 2016-11-18 DIAGNOSIS — M545 Low back pain, unspecified: Secondary | ICD-10-CM

## 2016-11-18 DIAGNOSIS — M4186 Other forms of scoliosis, lumbar region: Secondary | ICD-10-CM | POA: Diagnosis not present

## 2016-11-18 DIAGNOSIS — Z96641 Presence of right artificial hip joint: Secondary | ICD-10-CM | POA: Diagnosis not present

## 2016-11-19 ENCOUNTER — Encounter: Payer: Self-pay | Admitting: Internal Medicine

## 2016-11-19 ENCOUNTER — Telehealth: Payer: Self-pay | Admitting: Internal Medicine

## 2016-11-19 NOTE — Telephone Encounter (Signed)
VO given for PT 2x week x 4 weeks and 1x week x 2 weeks for balance, strength and safety, do you agree?

## 2016-11-19 NOTE — Telephone Encounter (Signed)
Physical therapy 2x 4weeks 1 x 2 weeks

## 2016-11-19 NOTE — Telephone Encounter (Signed)
Agree 

## 2016-11-20 DIAGNOSIS — M810 Age-related osteoporosis without current pathological fracture: Secondary | ICD-10-CM | POA: Diagnosis not present

## 2016-11-20 DIAGNOSIS — R269 Unspecified abnormalities of gait and mobility: Secondary | ICD-10-CM | POA: Diagnosis not present

## 2016-11-24 ENCOUNTER — Emergency Department (HOSPITAL_COMMUNITY)
Admission: EM | Admit: 2016-11-24 | Discharge: 2016-11-24 | Disposition: A | Discharge status: Home / Self Care | Payer: Medicare Other | Attending: Emergency Medicine | Admitting: Emergency Medicine

## 2016-11-24 ENCOUNTER — Encounter (HOSPITAL_COMMUNITY): Payer: Self-pay | Admitting: Emergency Medicine

## 2016-11-24 DIAGNOSIS — N183 Chronic kidney disease, stage 3 (moderate): Secondary | ICD-10-CM | POA: Diagnosis not present

## 2016-11-24 DIAGNOSIS — R531 Weakness: Secondary | ICD-10-CM | POA: Diagnosis not present

## 2016-11-24 DIAGNOSIS — I129 Hypertensive chronic kidney disease with stage 1 through stage 4 chronic kidney disease, or unspecified chronic kidney disease: Secondary | ICD-10-CM | POA: Insufficient documentation

## 2016-11-24 DIAGNOSIS — R42 Dizziness and giddiness: Secondary | ICD-10-CM | POA: Diagnosis not present

## 2016-11-24 DIAGNOSIS — Z7982 Long term (current) use of aspirin: Secondary | ICD-10-CM | POA: Insufficient documentation

## 2016-11-24 DIAGNOSIS — K591 Functional diarrhea: Secondary | ICD-10-CM | POA: Diagnosis not present

## 2016-11-24 DIAGNOSIS — H8110 Benign paroxysmal vertigo, unspecified ear: Secondary | ICD-10-CM | POA: Insufficient documentation

## 2016-11-24 DIAGNOSIS — R110 Nausea: Secondary | ICD-10-CM | POA: Diagnosis not present

## 2016-11-24 LAB — I-STAT CHEM 8, ED
BUN: 21 mg/dL — ABNORMAL HIGH (ref 6–20)
CREATININE: 1.2 mg/dL — AB (ref 0.44–1.00)
Calcium, Ion: 1.17 mmol/L (ref 1.15–1.40)
Chloride: 105 mmol/L (ref 101–111)
GLUCOSE: 101 mg/dL — AB (ref 65–99)
HCT: 38 % (ref 36.0–46.0)
HEMOGLOBIN: 12.9 g/dL (ref 12.0–15.0)
POTASSIUM: 3.4 mmol/L — AB (ref 3.5–5.1)
Sodium: 142 mmol/L (ref 135–145)
TCO2: 25 mmol/L (ref 0–100)

## 2016-11-24 MED ORDER — MECLIZINE HCL 12.5 MG PO TABS: 25.0000 mg | ORAL_TABLET | Freq: Three times a day (TID) | ORAL | 0 refills | Status: DC | PRN

## 2016-11-24 MED ORDER — MECLIZINE HCL 25 MG PO TABS
25.0000 mg | ORAL_TABLET | Freq: Once | ORAL | Status: AC
  Administered 2016-11-24: 25 mg via ORAL
  Filled 2016-11-24: qty 1

## 2016-11-24 NOTE — ED Notes (Signed)
Provided a sandwich and water while she waits for [facility].

## 2016-11-24 NOTE — ED Triage Notes (Signed)
Pt presents from home via EMS with complaint of nausea and dizziness x 48 hours.  Pt states she hasn't eaten or drank anything since last night. Has a history of vertigo that normally makes her nauseous but this feels different.  People in her apartment building have been sick lately.  History of Cerebral Palsy.  Ambulatory with assistance at baseline.
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
## 2016-11-24 NOTE — ED Notes (Signed)
Called [facility] about patients status. Pt is still on the list for transport.

## 2016-11-24 NOTE — ED Notes (Signed)
Call [facility], patient is still on list for transpiration.

## 2016-11-24 NOTE — ED Notes (Signed)
Bed: [facility] Expected date:  Expected time:  Means of arrival:  Comments: 

## 2016-11-24 NOTE — ED Notes (Signed)
Assisted patient with bedpan 

## 2016-11-24 NOTE — ED Notes (Signed)
Notified [facility] for transportation back to residents home. ETA: Unknown but 2 calls ahead of this call.

## 2016-11-24 NOTE — ED Notes (Signed)
[facility] at bedside 

## 2016-11-25 DIAGNOSIS — M810 Age-related osteoporosis without current pathological fracture: Secondary | ICD-10-CM | POA: Diagnosis not present

## 2016-11-25 DIAGNOSIS — R269 Unspecified abnormalities of gait and mobility: Secondary | ICD-10-CM | POA: Diagnosis not present

## 2016-11-26 ENCOUNTER — Telehealth: Payer: Self-pay

## 2016-11-26 NOTE — Telephone Encounter (Signed)
Explained word for dord letter from dr Software engineer, pt is good now

## 2016-11-26 NOTE — Telephone Encounter (Signed)
Needs to speak with a nurse regarding xray result. Please call back.

## 2016-11-27 DIAGNOSIS — R269 Unspecified abnormalities of gait and mobility: Secondary | ICD-10-CM | POA: Diagnosis not present

## 2016-11-27 DIAGNOSIS — M810 Age-related osteoporosis without current pathological fracture: Secondary | ICD-10-CM | POA: Diagnosis not present

## 2016-12-01 DIAGNOSIS — R269 Unspecified abnormalities of gait and mobility: Secondary | ICD-10-CM | POA: Diagnosis not present

## 2016-12-01 DIAGNOSIS — M810 Age-related osteoporosis without current pathological fracture: Secondary | ICD-10-CM | POA: Diagnosis not present

## 2016-12-04 DIAGNOSIS — R269 Unspecified abnormalities of gait and mobility: Secondary | ICD-10-CM | POA: Diagnosis not present

## 2016-12-04 DIAGNOSIS — M810 Age-related osteoporosis without current pathological fracture: Secondary | ICD-10-CM | POA: Diagnosis not present

## 2016-12-16 ENCOUNTER — Encounter: Payer: Self-pay | Admitting: Podiatry

## 2016-12-16 ENCOUNTER — Ambulatory Visit (INDEPENDENT_AMBULATORY_CARE_PROVIDER_SITE_OTHER): Payer: Medicare Other | Admitting: Podiatry

## 2016-12-16 DIAGNOSIS — B351 Tinea unguium: Secondary | ICD-10-CM

## 2016-12-16 DIAGNOSIS — M79676 Pain in unspecified toe(s): Secondary | ICD-10-CM

## 2016-12-16 NOTE — Progress Notes (Signed)
Subjective:     Patient ID: Kathy Owens, female   DOB: December 27, 1955, 61 y.o.   MRN: [id number]  HPIThis patient presents to office for preventive foot care services.  She has hx of CP and has long thick painful nails.  She has pain walking and wearing her shoes.   Review of Systems     Objective:   Physical Exam GENERAL APPEARANCE: Alert, conversant. Appropriately groomed. No acute distress.  VASCULAR: Pedal pulses palpable at  [facility] and PT bilateral.  Capillary refill time is immediate to all digits,  Normal temperature gradient.  Digital hair growth is present bilateral  NEUROLOGIC: sensation is normal to 5.07 monofilament at 5/5 sites bilateral.  Light touch is intact bilateral   DERMATOLOGIC: skin color, texture, and turgor are within normal limits.  No preulcerative lesions or ulcers  are seen, no interdigital maceration noted.  No open lesions present.   No drainage noted.[place] durum B/L NAILS  Thick disfigured discolored nails both feet  Orthopedic  Flattened foot profile right foot with navicular tuberosity.  STJ ROM minimal right foot. Severe HAV  B/L.  Hammer toes 5th B/L    Assessment:  Onychomycosis  B/L  PTTD right foot.    Plan:    Debridement of Nails both feet.  RTC 3 months     Gardiner Barefoot DPM

## 2016-12-22 ENCOUNTER — Encounter: Payer: Self-pay | Admitting: Internal Medicine

## 2016-12-23 NOTE — Telephone Encounter (Signed)
error 

## 2017-01-29 ENCOUNTER — Other Ambulatory Visit: Payer: Self-pay | Admitting: Internal Medicine

## 2017-01-29 DIAGNOSIS — M25512 Pain in left shoulder: Principal | ICD-10-CM

## 2017-01-29 DIAGNOSIS — M25511 Pain in right shoulder: Secondary | ICD-10-CM

## 2017-01-29 NOTE — Telephone Encounter (Signed)
Pain med refill °

## 2017-02-02 MED ORDER — HYDROCODONE-ACETAMINOPHEN 10-325 MG PO TABS: 1.0000 | ORAL_TABLET | Freq: Four times a day (QID) | ORAL | 0 refills | Status: DC | PRN

## 2017-02-02 NOTE — Telephone Encounter (Signed)
Call made to pt informing her rx's are ready for pickup.  [name] will be picking up prescriptions, but will not get off until 3:30pm.  I agreed to let pt's sister arrive by 4:30pm to pick up rx today or tomorrow.Kathy Hidden Cassady4/16/20188:51 AM

## 2017-02-04 ENCOUNTER — Telehealth: Payer: Self-pay | Admitting: Internal Medicine

## 2017-02-04 NOTE — Telephone Encounter (Signed)
Would like to talk to nurse °

## 2017-02-08 ENCOUNTER — Emergency Department (HOSPITAL_COMMUNITY)
Admission: EM | Admit: 2017-02-08 | Discharge: 2017-02-08 | Disposition: A | Discharge status: Home / Self Care | Payer: Medicare Other | Attending: Emergency Medicine | Admitting: Emergency Medicine

## 2017-02-08 ENCOUNTER — Emergency Department (HOSPITAL_COMMUNITY): Payer: Medicare Other

## 2017-02-08 ENCOUNTER — Encounter (HOSPITAL_COMMUNITY): Payer: Self-pay

## 2017-02-08 DIAGNOSIS — S0003XA Contusion of scalp, initial encounter: Secondary | ICD-10-CM | POA: Diagnosis not present

## 2017-02-08 DIAGNOSIS — W01198A Fall on same level from slipping, tripping and stumbling with subsequent striking against other object, initial encounter: Secondary | ICD-10-CM | POA: Insufficient documentation

## 2017-02-08 DIAGNOSIS — Z7982 Long term (current) use of aspirin: Secondary | ICD-10-CM | POA: Diagnosis not present

## 2017-02-08 DIAGNOSIS — I129 Hypertensive chronic kidney disease with stage 1 through stage 4 chronic kidney disease, or unspecified chronic kidney disease: Secondary | ICD-10-CM | POA: Insufficient documentation

## 2017-02-08 DIAGNOSIS — Y939 Activity, unspecified: Secondary | ICD-10-CM | POA: Insufficient documentation

## 2017-02-08 DIAGNOSIS — Z79899 Other long term (current) drug therapy: Secondary | ICD-10-CM | POA: Diagnosis not present

## 2017-02-08 DIAGNOSIS — Z23 Encounter for immunization: Secondary | ICD-10-CM | POA: Insufficient documentation

## 2017-02-08 DIAGNOSIS — S0101XA Laceration without foreign body of scalp, initial encounter: Secondary | ICD-10-CM | POA: Diagnosis not present

## 2017-02-08 DIAGNOSIS — Z9660 Presence of unspecified orthopedic joint implant: Secondary | ICD-10-CM | POA: Diagnosis not present

## 2017-02-08 DIAGNOSIS — S0990XA Unspecified injury of head, initial encounter: Secondary | ICD-10-CM | POA: Diagnosis not present

## 2017-02-08 DIAGNOSIS — N183 Chronic kidney disease, stage 3 (moderate): Secondary | ICD-10-CM | POA: Diagnosis not present

## 2017-02-08 DIAGNOSIS — Y929 Unspecified place or not applicable: Secondary | ICD-10-CM | POA: Insufficient documentation

## 2017-02-08 DIAGNOSIS — Y999 Unspecified external cause status: Secondary | ICD-10-CM | POA: Insufficient documentation

## 2017-02-08 DIAGNOSIS — S098XXA Other specified injuries of head, initial encounter: Secondary | ICD-10-CM | POA: Diagnosis not present

## 2017-02-08 DIAGNOSIS — G4489 Other headache syndrome: Secondary | ICD-10-CM | POA: Diagnosis not present

## 2017-02-08 DIAGNOSIS — W19XXXA Unspecified fall, initial encounter: Secondary | ICD-10-CM

## 2017-02-08 DIAGNOSIS — S0191XA Laceration without foreign body of unspecified part of head, initial encounter: Secondary | ICD-10-CM | POA: Diagnosis not present

## 2017-02-08 MED ORDER — TETANUS-DIPHTH-ACELL PERTUSSIS 5-2.5-18.5 LF-MCG/0.5 IM SUSP
0.5000 mL | Freq: Once | INTRAMUSCULAR | Status: AC
  Administered 2017-02-08: 0.5 mL via INTRAMUSCULAR
  Filled 2017-02-08: qty 0.5

## 2017-02-08 NOTE — Discharge Instructions (Signed)
Please read instructions below. Keep your wound clean, you can use soap and water and take a normal shower. Apply bacitracin to your wound for the next couple of days. You can take tylenol as needed for pain. Follow up with your primary care as needed. Return to the ER for vomiting, worsening headache, vision changes, fever, pus draining from your wound, or new or worsening symptoms.

## 2017-02-08 NOTE — ED Notes (Signed)
Bed: JK93 Expected date:  Expected time:  Means of arrival:  Comments: 61 yo fall with lac to eye

## 2017-02-08 NOTE — ED Triage Notes (Signed)
She lost her balance and fell whilst watering flowers this afternoon, thereby lac. Right upper forehead area. She has sm. Lac. There with edema. She is in no distress. She ambulates with walker which she needs d/t cerebral palsy.
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
## 2017-02-12 ENCOUNTER — Encounter: Payer: Self-pay | Admitting: Internal Medicine

## 2017-02-16 ENCOUNTER — Emergency Department (HOSPITAL_COMMUNITY)
Admission: EM | Admit: 2017-02-16 | Discharge: 2017-02-16 | Disposition: A | Discharge status: Home / Self Care | Payer: Medicare Other | Attending: Emergency Medicine | Admitting: Emergency Medicine

## 2017-02-16 ENCOUNTER — Emergency Department (HOSPITAL_COMMUNITY): Payer: Medicare Other

## 2017-02-16 DIAGNOSIS — R930 Abnormal findings on diagnostic imaging of skull and head, not elsewhere classified: Secondary | ICD-10-CM | POA: Diagnosis not present

## 2017-02-16 DIAGNOSIS — I129 Hypertensive chronic kidney disease with stage 1 through stage 4 chronic kidney disease, or unspecified chronic kidney disease: Secondary | ICD-10-CM | POA: Diagnosis not present

## 2017-02-16 DIAGNOSIS — H547 Unspecified visual loss: Secondary | ICD-10-CM

## 2017-02-16 DIAGNOSIS — H4710 Unspecified papilledema: Secondary | ICD-10-CM | POA: Diagnosis not present

## 2017-02-16 DIAGNOSIS — D179 Benign lipomatous neoplasm, unspecified: Secondary | ICD-10-CM | POA: Diagnosis not present

## 2017-02-16 DIAGNOSIS — Z7982 Long term (current) use of aspirin: Secondary | ICD-10-CM | POA: Diagnosis not present

## 2017-02-16 DIAGNOSIS — N183 Chronic kidney disease, stage 3 (moderate): Secondary | ICD-10-CM | POA: Insufficient documentation

## 2017-02-16 DIAGNOSIS — H4720 Unspecified optic atrophy: Secondary | ICD-10-CM | POA: Diagnosis not present

## 2017-02-16 LAB — CBC WITH DIFFERENTIAL/PLATELET
Basophils Absolute: 0 10*3/uL (ref 0.0–0.1)
Basophils Relative: 1 %
Eosinophils Absolute: 0.2 10*3/uL (ref 0.0–0.7)
Eosinophils Relative: 3 %
HCT: 37.3 % (ref 36.0–46.0)
Hemoglobin: 12.6 g/dL (ref 12.0–15.0)
Lymphocytes Relative: 29 %
Lymphs Abs: 1.8 10*3/uL (ref 0.7–4.0)
MCH: 29.3 pg (ref 26.0–34.0)
MCHC: 33.8 g/dL (ref 30.0–36.0)
MCV: 86.7 fL (ref 78.0–100.0)
Monocytes Absolute: 0.3 10*3/uL (ref 0.1–1.0)
Monocytes Relative: 4 %
Neutro Abs: 4 10*3/uL (ref 1.7–7.7)
Neutrophils Relative %: 63 %
Platelets: 209 10*3/uL (ref 150–400)
RBC: 4.3 MIL/uL (ref 3.87–5.11)
RDW: 14.1 % (ref 11.5–15.5)
WBC: 6.3 10*3/uL (ref 4.0–10.5)

## 2017-02-16 LAB — BASIC METABOLIC PANEL
Anion gap: 10 (ref 5–15)
BUN: 24 mg/dL — ABNORMAL HIGH (ref 6–20)
CO2: 26 mmol/L (ref 22–32)
Calcium: 9.6 mg/dL (ref 8.9–10.3)
Chloride: 105 mmol/L (ref 101–111)
Creatinine, Ser: 1.26 mg/dL — ABNORMAL HIGH (ref 0.44–1.00)
GFR calc Af Amer: 52 mL/min — ABNORMAL LOW (ref >60)
GFR calc non Af Amer: 45 mL/min — ABNORMAL LOW (ref >60)
Glucose, Bld: 95 mg/dL (ref 65–99)
Potassium: 4.2 mmol/L (ref 3.5–5.1)
Sodium: 141 mmol/L (ref 135–145)

## 2017-02-16 LAB — SEDIMENTATION RATE: Sed Rate: 3 mm/hr (ref 0–22)

## 2017-02-16 MED ORDER — LORAZEPAM 2 MG/ML IJ SOLN
1.0000 mg | Freq: Once | INTRAMUSCULAR | Status: AC
  Administered 2017-02-16: 1 mg via INTRAVENOUS
  Filled 2017-02-16: qty 1

## 2017-02-16 MED ORDER — GADOBENATE DIMEGLUMINE 529 MG/ML IV SOLN
15.0000 mL | Freq: Once | INTRAVENOUS | Status: AC | PRN
  Administered 2017-02-16: 15 mL via INTRAVENOUS

## 2017-02-16 NOTE — ED Triage Notes (Addendum)
Pt comes in from [facility] after Dr sent her to get an MRI as soon as possible. Pt states that Dr sent her due to changes in her vision. Pt tearful during triage stating "the doctor thinks it is a tumor growing". Pt reported having cataract surgery June 2017.
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
## 2017-02-16 NOTE — ED Notes (Signed)
PTAR notified.  ?

## 2017-03-12 ENCOUNTER — Telehealth: Payer: Self-pay | Admitting: *Deleted

## 2017-03-12 ENCOUNTER — Encounter: Payer: Self-pay | Admitting: Internal Medicine

## 2017-03-12 ENCOUNTER — Ambulatory Visit (INDEPENDENT_AMBULATORY_CARE_PROVIDER_SITE_OTHER): Payer: Medicare Other | Admitting: Internal Medicine

## 2017-03-12 ENCOUNTER — Encounter: Payer: Self-pay | Admitting: Neurology

## 2017-03-12 VITALS — BP 138/78 | HR 87 | Temp 97.9°F | Wt 148.5 lb

## 2017-03-12 DIAGNOSIS — R42 Dizziness and giddiness: Secondary | ICD-10-CM

## 2017-03-12 DIAGNOSIS — I10 Essential (primary) hypertension: Secondary | ICD-10-CM

## 2017-03-12 DIAGNOSIS — N183 Chronic kidney disease, stage 3 unspecified: Secondary | ICD-10-CM

## 2017-03-12 DIAGNOSIS — I129 Hypertensive chronic kidney disease with stage 1 through stage 4 chronic kidney disease, or unspecified chronic kidney disease: Secondary | ICD-10-CM

## 2017-03-12 DIAGNOSIS — K219 Gastro-esophageal reflux disease without esophagitis: Secondary | ICD-10-CM | POA: Diagnosis not present

## 2017-03-12 DIAGNOSIS — G808 Other cerebral palsy: Secondary | ICD-10-CM

## 2017-03-12 DIAGNOSIS — E785 Hyperlipidemia, unspecified: Secondary | ICD-10-CM

## 2017-03-12 DIAGNOSIS — G809 Cerebral palsy, unspecified: Secondary | ICD-10-CM

## 2017-03-12 DIAGNOSIS — Z79899 Other long term (current) drug therapy: Secondary | ICD-10-CM | POA: Diagnosis not present

## 2017-03-12 DIAGNOSIS — Z79891 Long term (current) use of opiate analgesic: Secondary | ICD-10-CM | POA: Diagnosis not present

## 2017-03-12 DIAGNOSIS — Z0000 Encounter for general adult medical examination without abnormal findings: Secondary | ICD-10-CM

## 2017-03-12 MED ORDER — MECLIZINE HCL 12.5 MG PO TABS: 12.5000 mg | ORAL_TABLET | Freq: Three times a day (TID) | ORAL | 0 refills | Status: DC | PRN

## 2017-03-12 MED ORDER — OMEPRAZOLE 20 MG PO CPDR: 20.0000 mg | DELAYED_RELEASE_CAPSULE | Freq: Every day | ORAL | 3 refills | Status: DC

## 2017-03-12 MED ORDER — ATORVASTATIN CALCIUM 10 MG PO TABS: 10.0000 mg | ORAL_TABLET | Freq: Every day | ORAL | 3 refills | Status: DC

## 2017-03-12 MED ORDER — TRIAMTERENE-HCTZ 37.5-25 MG PO TABS: 1.0000 | ORAL_TABLET | Freq: Every day | ORAL | 3 refills | Status: DC

## 2017-03-12 NOTE — Telephone Encounter (Signed)
I talked to Dr Lynnae January this morning; stated she will call pt. Informed pt this afternoon of Dr Zenovia Jarred response.

## 2017-03-12 NOTE — Telephone Encounter (Signed)
Pt saw Dr Lynnae January this morning - wants to know what stage of kidney disease is she in; stated the doctor explained it to her but din't understand.

## 2017-03-12 NOTE — Telephone Encounter (Signed)
Would like a call back from the nurse. Please call pt back.

## 2017-03-12 NOTE — Assessment & Plan Note (Signed)
This problem is chronic and stable. She takes Maxzide once a day. Her blood pressure is almost at goal but I was hesitant to increase her regimen today due to the dizziness. Once her dizziness is settled, I would like to add a second agent to get truly at goal.  PLAN:  Cont current meds   BP Readings from Last 3 Encounters:  03/12/17 138/78  02/16/17 (!) 156/86  02/08/17 128/75

## 2017-03-12 NOTE — Patient Instructions (Signed)
1. I am sending yo uto neurology for vertigo 2. I sent in your refills to [facility].

## 2017-03-12 NOTE — Assessment & Plan Note (Signed)
This problem is chronic and stable. She did try an H2 blocker instead of her PPI and did not have good relief. She then resumed her PPI and is taking it once a day. She is asymptomatic when she takes her medication.  PLAN:  Cont current meds

## 2017-03-12 NOTE — Assessment & Plan Note (Signed)
This problem is chronic and stable. She is on this for primary prevention. She has no side effects to her Lipitor 10 mg.  PLAN:  Cont current meds

## 2017-03-12 NOTE — Assessment & Plan Note (Signed)
She states that recently she had stinging in her chest in the central region. It was intermittent and lasted a couple of days that has since completely resolved. She states it was associated with feeling upset and worrying about her sister and that it never occurred when she wasn't upset. It went away completely without intervention.  She also requested something to help with her worry over her sister Charleston Ropes. Upon further questioning. Since her sister has gotten a job she no longer worries about her. Therefore no intervention is needed.

## 2017-03-12 NOTE — Assessment & Plan Note (Signed)
This problem is chronic and stable. I showed her her creatinine trend over the past several years. I stressed that even though her creatinine was elevated, it has not worsened in the past 6 years. She asked what she could do to protect her kidneys and I encouraged her to decrease or stop her naproxen usage and to try Tylenol. I also stated that I will be following her creatinine about every 6 months.  Plan : Decrease or stop naproxen and try Tylenol Check creatinine about October

## 2017-03-12 NOTE — Progress Notes (Signed)
   Subjective:    Patient ID: Kathy Owens, female    DOB: [birth year], 61 y.o.   MRN: [id number]  HPI  Kathy Owens is here for vertigo. Please see the A&P for the status of the pt's chronic medical problems.  ROS : per ROS section and in problem oriented charting. All other systems are negative.  PMHx, Soc hx, and / or Fam hx : Her sister, daily, now has a job and is no longer bothering her about moving in together. She states her sister gets drunk too often. Kathy Owens told me about her recent fall and her ophthalmology appointment and findings.   Review of Systems  Constitutional: Positive for activity change.  Eyes: Positive for visual disturbance.  Respiratory: Positive for chest tightness.   Gastrointestinal: Positive for nausea. Negative for vomiting.  Musculoskeletal: Positive for arthralgias, back pain and gait problem.  Skin:       Resolving ecchymosis around her right eye  Neurological: Positive for dizziness, weakness and light-headedness. Negative for headaches.       + fall       Objective:   Physical Exam  Constitutional: She appears well-developed and well-nourished. No distress.  HENT:  Head: Normocephalic and atraumatic.  Right Ear: External ear normal.  Left Ear: External ear normal.  Nose: Nose normal.  Eyes: Conjunctivae and EOM are normal. Right eye exhibits no discharge. Left eye exhibits no discharge. No scleral icterus.  Cardiovascular: Normal rate, regular rhythm and normal heart sounds.   No murmur heard. Pulmonary/Chest: Effort normal and breath sounds normal. No respiratory distress.  Musculoskeletal: She exhibits edema. She exhibits no deformity.  Trace bilateral lower extremity   Neurological: She is alert.  No nystagmus Extraocular muscles intact No facial asymmetry Speech normal  Skin: Skin is warm and dry. She is not diaphoretic.  Psychiatric: She has a normal mood and affect. Her behavior is normal. Judgment and thought content  normal.          Assessment & Plan:

## 2017-03-12 NOTE — Telephone Encounter (Signed)
Patient calling back- wants to further discuss the nature of her "kidney disease".  Very anxious-will forward to pcp to contact pt with info.Kathy Hidden Cassady5/24/20182:08 PM

## 2017-03-12 NOTE — Assessment & Plan Note (Signed)
This problem is chronic and stable. Because she was having difficulty standing up and required the use of her hands to do this, I had sent home physical therapy to her home at her last appointment. She states that she overall did not have a good relationship with the therapist but does find that she can stand up easier now. She is not interested in any further therapy. She has had additional falls since then which required an ER visit in ophthalmology follow-up.  Plan : Continue to follow

## 2017-03-12 NOTE — Assessment & Plan Note (Signed)
This problem is chronic and stable. She is not yet due for her next refill until mid August. She has no side effects to the medication. She has had no red flags.  PLAN:  Cont current meds Return in August for pills

## 2017-03-12 NOTE — Assessment & Plan Note (Signed)
This problem is chronic and worsening. At her last appointment, she stated that she only got episodes once a month. Today she is stating that she is getting them every day. When she gets them she takes the meclizine and goes to bed to sleep it off. She sleeps at least an hour. When she gets the episode she gets dizzy and is unable to walk. She no longer gets vomiting with the episodes. It is not associated with any headaches but is associated with worsening blurry vision. She states that sometimes standing up can trigger it. She uses meclizine and is asking for a refill. She has seen ENT in 2015 but that interaction was not good for her. ENT records were reviewed and noticed increased hearing loss left greater than right and recommended stopping the meclizine usage due to its side effects. Incidentally, she had an MRI last month that did not show any abnormalities.  Her exam was normal today including orthostatics although she was unable to lay down on the table. I inherited Kathy Owens with a diagnosis of BPPV but I doubt that this is the diagnosis since her episodes last more than a minute or so and she has had associated nausea and vomiting. Vestibular neuritis would definitely be a possibility as it could cause the associated nausea and vomiting but typically lasts longer than the hour that her symptoms last. She has had an MRI to rule out any central causes.  At this point, we discussed that I could refill the meclizine and refer her to vestibular rehabilitation or I could refer her to neurology which was her request initially. She opted further referral to neurology which I have placed.  Plan : Refill meclizine until she is able to see neurology for further evaluation

## 2017-03-17 ENCOUNTER — Telehealth: Payer: Self-pay

## 2017-03-17 ENCOUNTER — Ambulatory Visit: Payer: Medicare Other | Admitting: Podiatry

## 2017-03-17 DIAGNOSIS — H469 Unspecified optic neuritis: Secondary | ICD-10-CM | POA: Diagnosis not present

## 2017-03-17 NOTE — Telephone Encounter (Signed)
Requesting lab results. Please call pt back.  

## 2017-03-18 NOTE — Telephone Encounter (Signed)
Yes. I called her back last week. Kathy Owens

## 2017-04-01 ENCOUNTER — Encounter: Payer: Self-pay | Admitting: Neurology

## 2017-04-01 ENCOUNTER — Ambulatory Visit (INDEPENDENT_AMBULATORY_CARE_PROVIDER_SITE_OTHER): Payer: Medicare Other | Admitting: Neurology

## 2017-04-01 VITALS — BP 132/76 | HR 79 | Ht 60.0 in | Wt 152.0 lb

## 2017-04-01 DIAGNOSIS — R42 Dizziness and giddiness: Secondary | ICD-10-CM

## 2017-04-01 NOTE — Patient Instructions (Addendum)
1. Refer to ENT, Dr. Melony Overly 2. If vertigo comes back, would recommend going back for vestibular therapy 3. Records from Dr. Manuella Ghazi will be requested for review 4. Follow-up on as needed basis, call for any changes
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
## 2017-04-02 ENCOUNTER — Encounter: Payer: Self-pay | Admitting: Neurology

## 2017-04-02 ENCOUNTER — Telehealth: Payer: Self-pay | Admitting: Neurology

## 2017-04-02 NOTE — Telephone Encounter (Signed)
PT left a voicemail message saying she was returning a call regarding some forms that needed to be signed

## 2017-04-02 NOTE — Telephone Encounter (Signed)
Spoke with pt.  She states that Dr. Manuella Ghazi had sent her records to Dr. Lynnae January.  Looked in the system and Dr. Lynnae January had scanned the documents in the [employer] system under the Media tab.

## 2017-04-06 ENCOUNTER — Telehealth: Payer: Self-pay | Admitting: Neurology

## 2017-04-06 NOTE — Telephone Encounter (Signed)
Patient wants to checking on status of the referral to the Ear Dr  Also patient wants to see if Dr Delice Lesch has looked at the [place] from the EYE Dr and can tell her what is on her Brain and why it is there

## 2017-04-08 NOTE — Telephone Encounter (Signed)
Pls let her know I don't have the most recent eye exam. We will see if [place] sees medicaid, otherwise may be best to ask her PCP since they may have a referral list for their medicaid patients. Thanks

## 2017-04-08 NOTE — Telephone Encounter (Signed)
Pt called back stating that Dr. Jennell Corner office will only file under [employer] but not Medicaid.  She needs to be seen by someone who will file both insurances.    She was also curious about the images from her recent eye exam.  Let her know that Dr. Delice Lesch has not reviewed them yet.  States it is not bothersome and is not causing harm so it is not an urgent matter.  She is just curious as to what it could be.

## 2017-04-09 ENCOUNTER — Ambulatory Visit: Payer: Medicare Other | Admitting: Podiatry

## 2017-04-10 NOTE — Telephone Encounter (Signed)
Wrote letter asking pt to sign release form.  Letter and form mailed to pt today along with self addressed envelope

## 2017-04-15 ENCOUNTER — Telehealth: Payer: Self-pay

## 2017-04-15 NOTE — Telephone Encounter (Signed)
Pt called letting me know that she had tried sending me medical release form in the self addressed envelope I had provided to her, however it was returned back to pt.  She asked if I could possibly send another envelope, I will today.    **Dr. Trena Platt office will not release her most recent images with a signed medical release form**

## 2017-04-25 IMAGING — CT CT HEAD W/O CM
3 of 4 series · 14 of 47 positions shown, 16 images · non-contrast
Comparison: MRI of the brain 09/20/2014.

CLINICAL DATA: 61-year-old female with history of trauma from a
fall after losing her balance this afternoon.

EXAM:
CT HEAD WITHOUT CONTRAST
TECHNIQUE: Contiguous axial images were obtained from the base of the skull
through the vertex without intravenous contrast.

[Series 2: head w/o · axial · non-contrast · 0.46mm/px · z∈[+1476,+1596]mm · 8 of 29 slices shown, 10 images]
[im 3/29  brain]
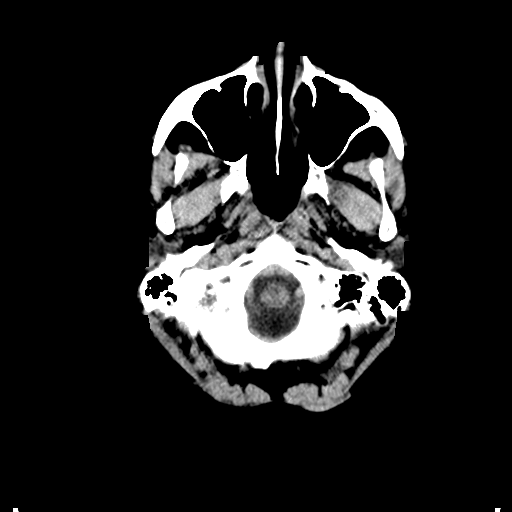
[im 3/29  bone]
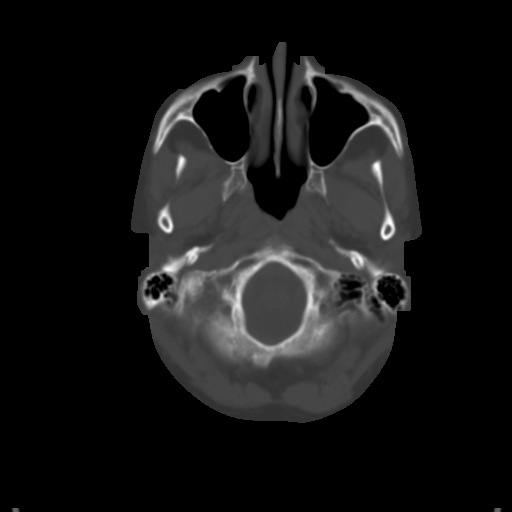
[im 7/29  brain]
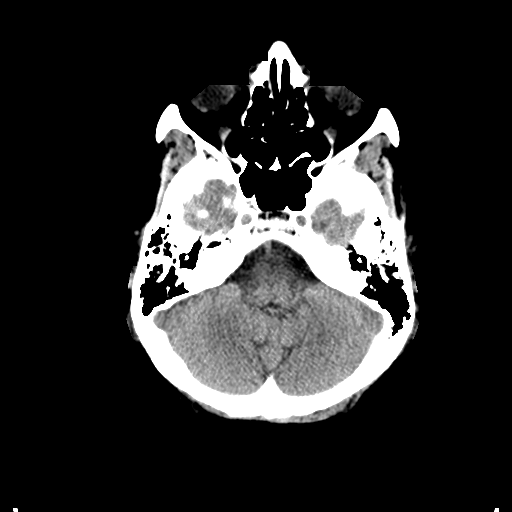
[im 11/29  brain]
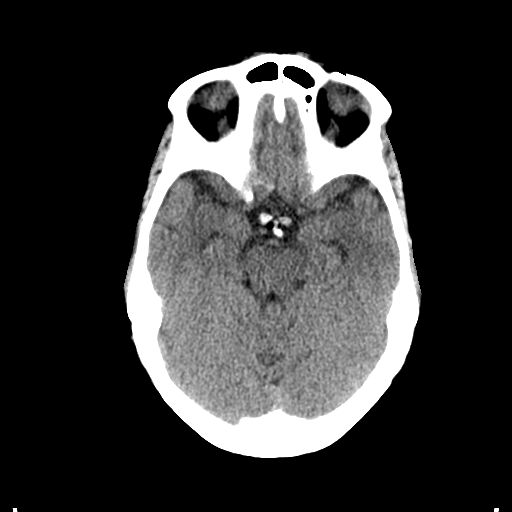
[im 13/29  brain]
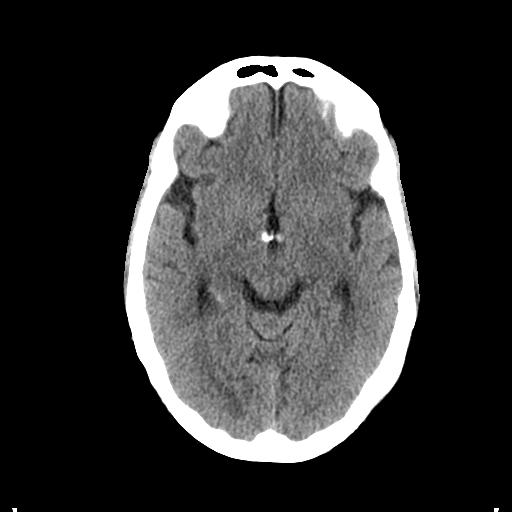
[im 17/29  brain]
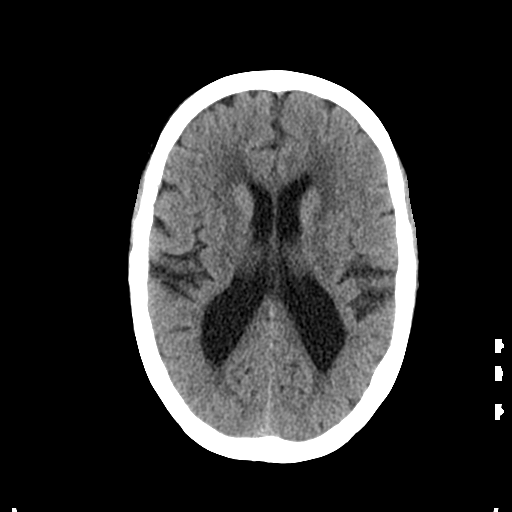
[im 17/29  bone]
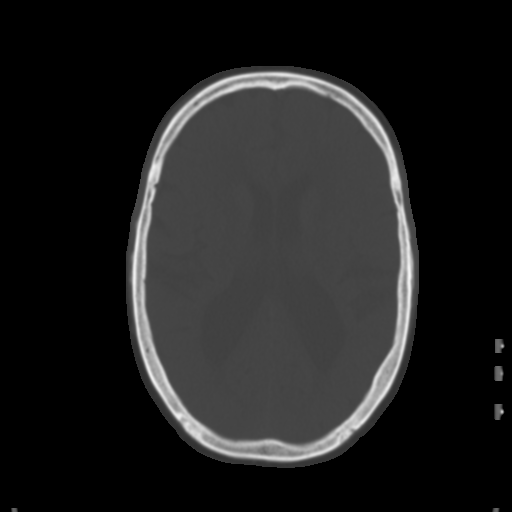
[im 19/29  brain]
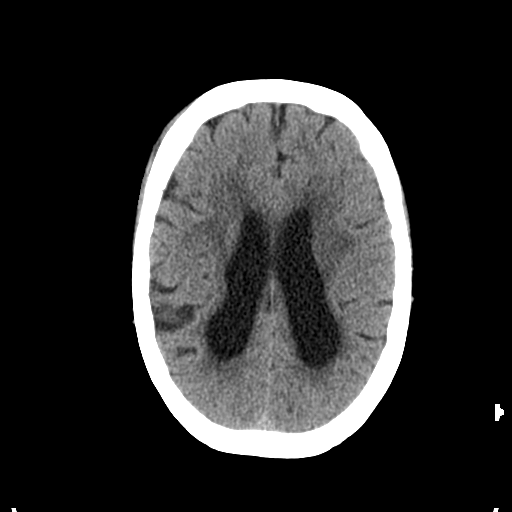
[im 23/29  brain]
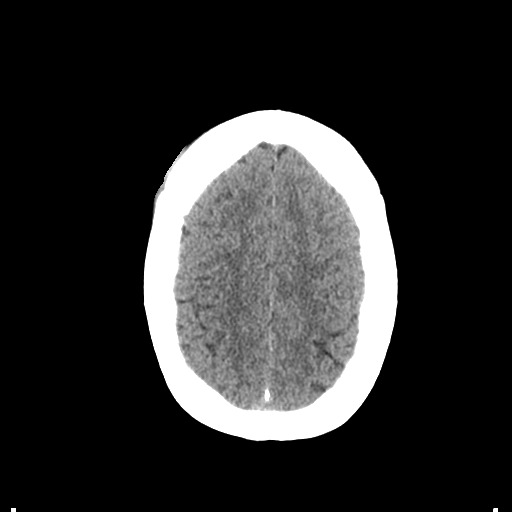
[im 27/29  brain]
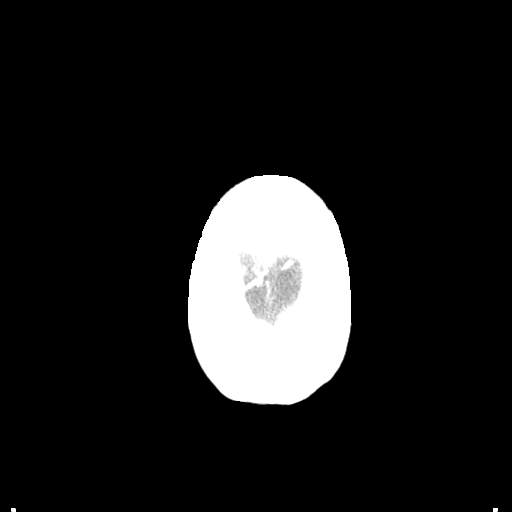

[Series 4: coronal · coronal · 0.30mm/px · 3 of 77 slices shown]
[im 26/77  brain]
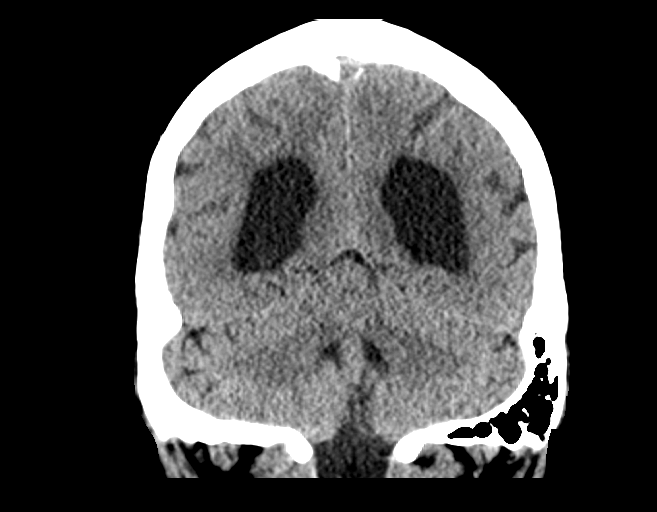
[im 34/77  brain]
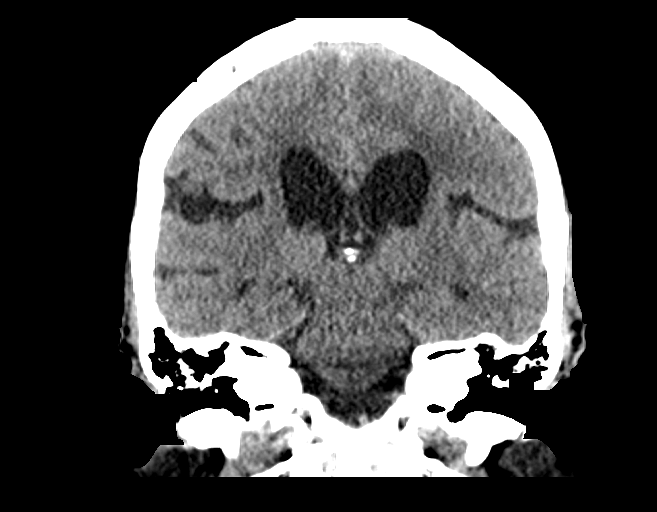
[im 43/77  brain]
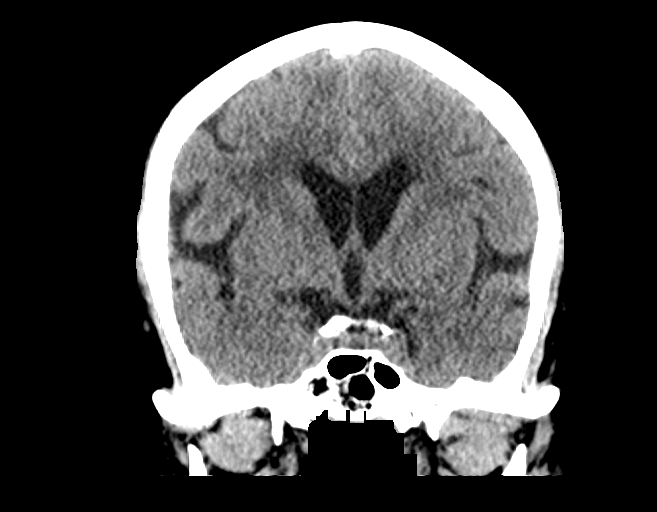

[Series 5: sagittal · sagittal · 0.27mm/px · 3 of 74 slices shown]
[im 25/74  brain]
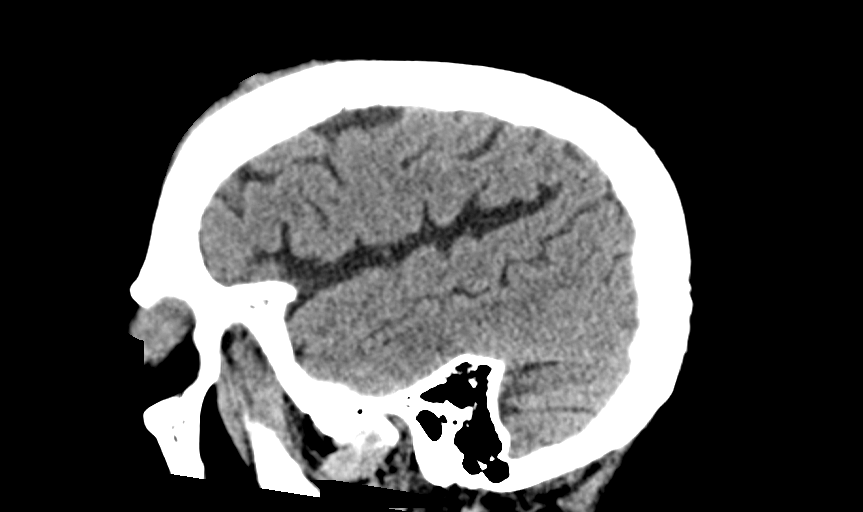
[im 37/74  brain]
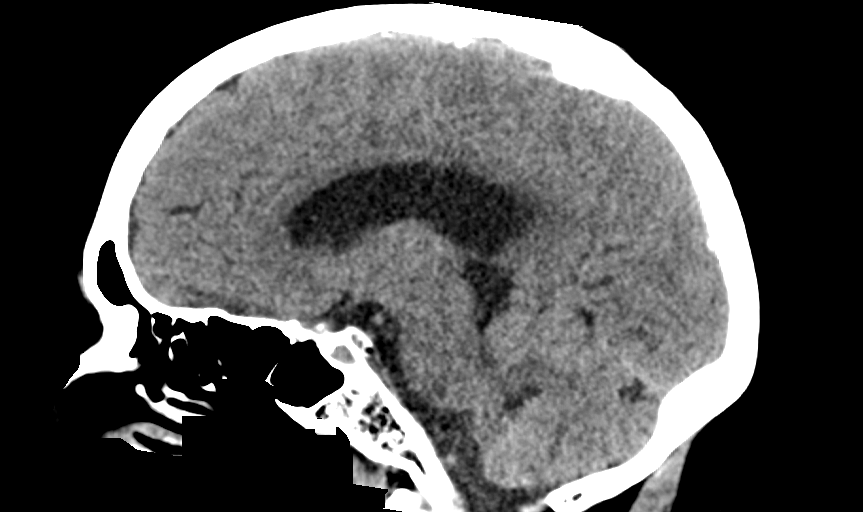
[im 49/74  brain]
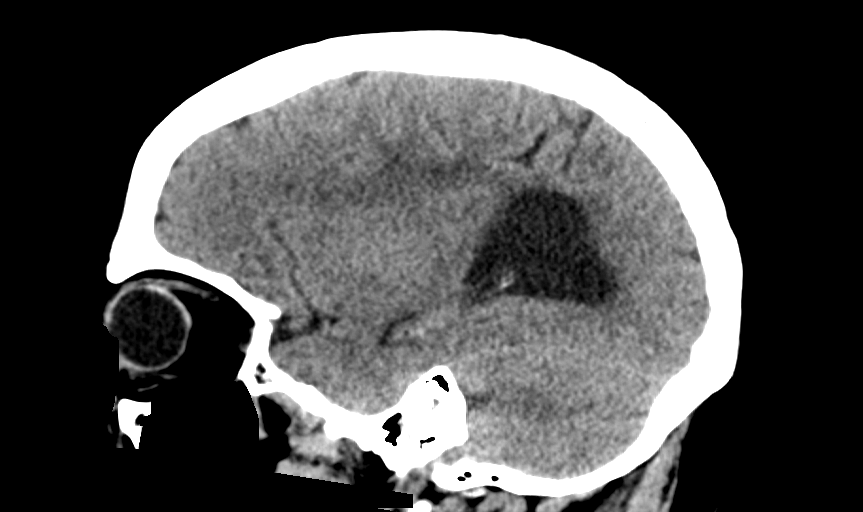

[14 of 47 positions shown; findings below may reference images not displayed]

FINDINGS: Brain: Patchy and confluent areas of decreased attenuation are noted
throughout the deep and periventricular white matter of the cerebral
hemispheres bilaterally, compatible with chronic microvascular
ischemic disease. No evidence of acute infarction, hemorrhage,
hydrocephalus, extra-axial collection or mass lesion/mass effect.

Vascular: No hyperdense vessel.

Skull: Normal. Negative for fracture or focal lesion.

Sinuses/Orbits: No acute finding.

Other: High attenuation soft tissue swelling in the right frontal
scalp, compatible with a small hematoma.
IMPRESSION: 1. Small right frontal scalp hematoma, without evidence of
underlying skull fracture or acute intracranial abnormality.
2. Chronic microvascular ischemic changes in cerebral white matter,
as above.

## 2017-05-01 ENCOUNTER — Other Ambulatory Visit: Payer: Self-pay

## 2017-05-01 DIAGNOSIS — M25512 Pain in left shoulder: Principal | ICD-10-CM

## 2017-05-01 DIAGNOSIS — M25511 Pain in right shoulder: Secondary | ICD-10-CM

## 2017-05-01 NOTE — Telephone Encounter (Signed)
Requesting pain med to be filled.  

## 2017-05-04 MED ORDER — HYDROCODONE-ACETAMINOPHEN 10-325 MG PO TABS: 1.0000 | ORAL_TABLET | Freq: Four times a day (QID) | ORAL | 0 refills | Status: DC | PRN

## 2017-05-04 NOTE — Telephone Encounter (Signed)
She can get all three today.

## 2017-06-11 ENCOUNTER — Encounter: Payer: Self-pay | Admitting: Internal Medicine

## 2017-06-15 DIAGNOSIS — H469 Unspecified optic neuritis: Secondary | ICD-10-CM | POA: Diagnosis not present

## 2017-06-24 ENCOUNTER — Encounter (HOSPITAL_COMMUNITY): Payer: Self-pay | Admitting: *Deleted

## 2017-06-24 ENCOUNTER — Emergency Department (HOSPITAL_COMMUNITY)
Admission: EM | Admit: 2017-06-24 | Discharge: 2017-06-24 | Disposition: A | Discharge status: Home / Self Care | Payer: Medicare Other | Attending: Emergency Medicine | Admitting: Emergency Medicine

## 2017-06-24 ENCOUNTER — Emergency Department (HOSPITAL_COMMUNITY): Payer: Medicare Other

## 2017-06-24 DIAGNOSIS — N183 Chronic kidney disease, stage 3 (moderate): Secondary | ICD-10-CM | POA: Insufficient documentation

## 2017-06-24 DIAGNOSIS — I10 Essential (primary) hypertension: Secondary | ICD-10-CM | POA: Diagnosis not present

## 2017-06-24 DIAGNOSIS — R55 Syncope and collapse: Secondary | ICD-10-CM | POA: Insufficient documentation

## 2017-06-24 DIAGNOSIS — Z7982 Long term (current) use of aspirin: Secondary | ICD-10-CM | POA: Insufficient documentation

## 2017-06-24 DIAGNOSIS — R42 Dizziness and giddiness: Secondary | ICD-10-CM | POA: Diagnosis not present

## 2017-06-24 DIAGNOSIS — Z79899 Other long term (current) drug therapy: Secondary | ICD-10-CM | POA: Diagnosis not present

## 2017-06-24 DIAGNOSIS — I129 Hypertensive chronic kidney disease with stage 1 through stage 4 chronic kidney disease, or unspecified chronic kidney disease: Secondary | ICD-10-CM | POA: Diagnosis not present

## 2017-06-24 DIAGNOSIS — E860 Dehydration: Secondary | ICD-10-CM

## 2017-06-24 DIAGNOSIS — R404 Transient alteration of awareness: Secondary | ICD-10-CM | POA: Diagnosis not present

## 2017-06-24 DIAGNOSIS — I517 Cardiomegaly: Secondary | ICD-10-CM | POA: Diagnosis not present

## 2017-06-24 DIAGNOSIS — G809 Cerebral palsy, unspecified: Secondary | ICD-10-CM | POA: Diagnosis not present

## 2017-06-24 LAB — BASIC METABOLIC PANEL
Anion gap: 12 (ref 5–15)
BUN: 31 mg/dL — AB (ref 6–20)
CHLORIDE: 106 mmol/L (ref 101–111)
CO2: 21 mmol/L — ABNORMAL LOW (ref 22–32)
CREATININE: 1.19 mg/dL — AB (ref 0.44–1.00)
Calcium: 9.4 mg/dL (ref 8.9–10.3)
GFR calc Af Amer: 56 mL/min — ABNORMAL LOW (ref >60)
GFR calc non Af Amer: 48 mL/min — ABNORMAL LOW (ref >60)
Glucose, Bld: 99 mg/dL (ref 65–99)
Potassium: 3.7 mmol/L (ref 3.5–5.1)
SODIUM: 139 mmol/L (ref 135–145)

## 2017-06-24 LAB — HEPATIC FUNCTION PANEL
ALK PHOS: 40 U/L (ref 38–126)
ALT: 24 U/L (ref 14–54)
AST: 22 U/L (ref 15–41)
Albumin: 4.2 g/dL (ref 3.5–5.0)
BILIRUBIN DIRECT: 0.1 mg/dL (ref 0.1–0.5)
BILIRUBIN INDIRECT: 0.8 mg/dL (ref 0.3–0.9)
Total Bilirubin: 0.9 mg/dL (ref 0.3–1.2)
Total Protein: 6.8 g/dL (ref 6.5–8.1)

## 2017-06-24 LAB — URINALYSIS, ROUTINE W REFLEX MICROSCOPIC
BILIRUBIN URINE: NEGATIVE
GLUCOSE, UA: NEGATIVE mg/dL
HGB URINE DIPSTICK: NEGATIVE
KETONES UR: NEGATIVE mg/dL
LEUKOCYTES UA: NEGATIVE
Nitrite: NEGATIVE
PROTEIN: NEGATIVE mg/dL
Specific Gravity, Urine: 1.006 (ref 1.005–1.030)
pH: 5 (ref 5.0–8.0)

## 2017-06-24 LAB — CBC
HCT: 38 % (ref 36.0–46.0)
Hemoglobin: 12.5 g/dL (ref 12.0–15.0)
MCH: 28.9 pg (ref 26.0–34.0)
MCHC: 32.9 g/dL (ref 30.0–36.0)
MCV: 87.8 fL (ref 78.0–100.0)
PLATELETS: 204 10*3/uL (ref 150–400)
RBC: 4.33 MIL/uL (ref 3.87–5.11)
RDW: 13.8 % (ref 11.5–15.5)
WBC: 6 10*3/uL (ref 4.0–10.5)

## 2017-06-24 LAB — I-STAT TROPONIN, ED
TROPONIN I, POC: 0 ng/mL (ref 0.00–0.08)
Troponin i, poc: 0 ng/mL (ref 0.00–0.08)

## 2017-06-24 LAB — PROTIME-INR
INR: 1.03
Prothrombin Time: 13.4 seconds (ref 11.4–15.2)

## 2017-06-24 LAB — CBG MONITORING, ED: Glucose-Capillary: 80 mg/dL (ref 65–99)

## 2017-06-24 MED ORDER — SODIUM CHLORIDE 0.9 % IV BOLUS (SEPSIS)
1000.0000 mL | Freq: Once | INTRAVENOUS | Status: AC
  Administered 2017-06-24: 1000 mL via INTRAVENOUS
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
## 2017-06-24 NOTE — ED Triage Notes (Signed)
Pt c/o dizziness daily x3 days with onset at 15:00, denies nv/d, pt denies SOB, A&O x4, no facial droop or slurred speech noted, #20 in L  Hand started by [facility]

## 2017-06-24 NOTE — ED Notes (Signed)
[employer] room 104

## 2017-06-24 NOTE — Discharge Instructions (Signed)
Your workup today showed no evidence of infection or heart troubles causing your lightheaded spells. We suspect you are dehydrated as you are feeling better after the fluids. Please follow-up with your primary care physician for reassessment in the next several days. If any symptoms change or worsen or return, please return to the nearest emergency department.

## 2017-06-24 NOTE — ED Triage Notes (Signed)
Pt arrived by [facility] from home. Reports intermittent episodes of dizziness x 3 days. Symptoms resolved prior to ems arrival but first responders had SBP > 200.

## 2017-06-26 LAB — URINE CULTURE

## 2017-07-01 ENCOUNTER — Encounter: Payer: Self-pay | Admitting: Internal Medicine

## 2017-07-01 ENCOUNTER — Ambulatory Visit (INDEPENDENT_AMBULATORY_CARE_PROVIDER_SITE_OTHER): Payer: Medicare Other | Admitting: Podiatry

## 2017-07-01 ENCOUNTER — Encounter: Payer: Self-pay | Admitting: Podiatry

## 2017-07-01 DIAGNOSIS — H47013 Ischemic optic neuropathy, bilateral: Secondary | ICD-10-CM | POA: Insufficient documentation

## 2017-07-01 DIAGNOSIS — B351 Tinea unguium: Secondary | ICD-10-CM

## 2017-07-01 NOTE — Progress Notes (Signed)
Complaint:  Visit Type: Patient returns to my office for continued preventative foot care services. Complaint: Patient states" my nails have grown long and thick and become painful to walk and wear shoes" Patient has been diagnosed with CP. The patient presents for preventative foot care services. No changes to ROS  Podiatric Exam: Vascular: dorsalis pedis and posterior tibial pulses are palpable bilateral. Capillary return is immediate. Temperature gradient is WNL. Skin turgor WNL  Sensorium: Normal Semmes Weinstein monofilament test. Normal tactile sensation bilaterally. Nail Exam: Pt has thick disfigured discolored nails with subungual debris noted bilateral entire nail hallux through fifth toenails Ulcer Exam: There is no evidence of ulcer or pre-ulcerative changes or infection. Orthopedic Exam: Muscle tone and strength are WNL. No limitations in general ROM. No crepitus or effusions noted. Flattened foot profile with prominent navicular tuberosity. STJ minimal right foot.  Severe HAV  B/L  Hammer toes 5th  B/L Skin: No Porokeratosis. No infection or ulcers  Diagnosis:  Onychomycosis, , Pain in right toe, pain in left toes  Treatment & Plan Procedures and Treatment: Consent by patient was obtained for treatment procedures.   Debridement of mycotic and hypertrophic toenails, 1 through 5 bilateral and clearing of subungual debris. No ulceration, no infection noted.  Return Visit-Office Procedure: Patient instructed to return to the office for a follow up visit 3 months for continued evaluation and treatment.    Gardiner Barefoot DPM

## 2017-07-02 ENCOUNTER — Telehealth: Payer: Self-pay

## 2017-07-02 ENCOUNTER — Telehealth: Payer: Self-pay | Admitting: Internal Medicine

## 2017-07-02 ENCOUNTER — Ambulatory Visit (INDEPENDENT_AMBULATORY_CARE_PROVIDER_SITE_OTHER): Payer: Medicare Other | Admitting: Internal Medicine

## 2017-07-02 ENCOUNTER — Encounter: Payer: Self-pay | Admitting: Internal Medicine

## 2017-07-02 DIAGNOSIS — G808 Other cerebral palsy: Secondary | ICD-10-CM

## 2017-07-02 DIAGNOSIS — R42 Dizziness and giddiness: Secondary | ICD-10-CM | POA: Diagnosis not present

## 2017-07-02 DIAGNOSIS — I129 Hypertensive chronic kidney disease with stage 1 through stage 4 chronic kidney disease, or unspecified chronic kidney disease: Secondary | ICD-10-CM | POA: Diagnosis not present

## 2017-07-02 DIAGNOSIS — E785 Hyperlipidemia, unspecified: Secondary | ICD-10-CM | POA: Diagnosis not present

## 2017-07-02 DIAGNOSIS — H47013 Ischemic optic neuropathy, bilateral: Secondary | ICD-10-CM

## 2017-07-02 DIAGNOSIS — G479 Sleep disorder, unspecified: Secondary | ICD-10-CM

## 2017-07-02 DIAGNOSIS — Z811 Family history of alcohol abuse and dependence: Secondary | ICD-10-CM

## 2017-07-02 DIAGNOSIS — M810 Age-related osteoporosis without current pathological fracture: Secondary | ICD-10-CM

## 2017-07-02 DIAGNOSIS — M25512 Pain in left shoulder: Secondary | ICD-10-CM

## 2017-07-02 DIAGNOSIS — Z7983 Long term (current) use of bisphosphonates: Secondary | ICD-10-CM

## 2017-07-02 DIAGNOSIS — G809 Cerebral palsy, unspecified: Secondary | ICD-10-CM | POA: Diagnosis not present

## 2017-07-02 DIAGNOSIS — Z9181 History of falling: Secondary | ICD-10-CM | POA: Diagnosis not present

## 2017-07-02 DIAGNOSIS — Z79899 Other long term (current) drug therapy: Secondary | ICD-10-CM

## 2017-07-02 DIAGNOSIS — Z9989 Dependence on other enabling machines and devices: Secondary | ICD-10-CM

## 2017-07-02 DIAGNOSIS — Z0000 Encounter for general adult medical examination without abnormal findings: Secondary | ICD-10-CM

## 2017-07-02 DIAGNOSIS — K219 Gastro-esophageal reflux disease without esophagitis: Secondary | ICD-10-CM

## 2017-07-02 DIAGNOSIS — N183 Chronic kidney disease, stage 3 unspecified: Secondary | ICD-10-CM

## 2017-07-02 DIAGNOSIS — Z23 Encounter for immunization: Secondary | ICD-10-CM

## 2017-07-02 DIAGNOSIS — E7800 Pure hypercholesterolemia, unspecified: Secondary | ICD-10-CM

## 2017-07-02 DIAGNOSIS — Z79891 Long term (current) use of opiate analgesic: Secondary | ICD-10-CM

## 2017-07-02 DIAGNOSIS — I10 Essential (primary) hypertension: Secondary | ICD-10-CM

## 2017-07-02 DIAGNOSIS — Z5189 Encounter for other specified aftercare: Secondary | ICD-10-CM

## 2017-07-02 DIAGNOSIS — M25511 Pain in right shoulder: Secondary | ICD-10-CM

## 2017-07-02 MED ORDER — HYDROCODONE-ACETAMINOPHEN 10-325 MG PO TABS: 1.0000 | ORAL_TABLET | Freq: Four times a day (QID) | ORAL | 0 refills | Status: DC | PRN

## 2017-07-02 MED ORDER — RANITIDINE HCL 150 MG PO TABS: 150.0000 mg | ORAL_TABLET | Freq: Two times a day (BID) | ORAL | 3 refills | Status: DC

## 2017-07-02 NOTE — Telephone Encounter (Signed)
   Reason for call:   I received a call from [name] at 5:30 PM indicating that she is wondering if having a sleep study is a good idea.   Pertinent Data:   Patient reports a history of cataracts and changes seen by Ophthalmology that may be secondary to high blood pressure or sleep apnea.  She saw her PCP, Dr. Lynnae January, in clinic today (07/02/17) and discussed further testing for sleep apnea. She declined a sleep study today.  Now that she has had time to think about this, she is reconsidering having a sleep study.   Assessment / Plan / Recommendations:   Encouraged patient to continue discussions about obtaining a sleep study if appropriate with Dr. Lynnae January. She understands and is agreeable. She has a follow up appointment in 3 months, but will call sooner if needed. Will forward discussion to Dr. Lynnae January. She is advised to stay safe with the oncoming inclement weather.  As always, pt is advised that if symptoms worsen or new symptoms arise, they should go to an urgent care facility or to to ER for further evaluation.   Zada Finders, MD   07/02/2017, 5:47 PM

## 2017-07-02 NOTE — Assessment & Plan Note (Signed)
This problem is chronic and improved. She is no longer getting vertigo daily. She was in the ER on September 5 for lightheadedness but not her typical vertigo. She was diagnosed with dehydration. She also stated that her blood pressure was in the 117B systolic when EMS arrived. She continues to take meclizine as needed. Some weeks she can go without any, other week she takes it as needed. She cannot quantify how often she takes the meclizine. She took one this morning because she didn't feel right and had a funny feeling in her head. She did see neurology who recommended an ENT referral due to the hearing issues and also vestibular rehabilitation if the vertigo continues.  PLAN : Continue meclizine as needed If vertigo worsens, I will stress the ENT referral and vestibular rehabilitation.

## 2017-07-02 NOTE — Assessment & Plan Note (Addendum)
This problem is chronic and stable. Her regimen is Maxzide daily. She has no SE to this medicine. Her BP is appropriate.    PLAN:  Cont current meds    BP Readings from Last 3 Encounters:  07/02/17 138/80  06/24/17 139/73  04/01/17 132/76

## 2017-07-02 NOTE — Assessment & Plan Note (Addendum)
This problem is chronic and stable. Her Cr / GFR have been stable for the past 6 yrs. We have previously discussed the stability of her GFR, how to preserve GFR, and unlikeliness of needing HD as long as remains stable.  PLAN : cont RF mgmt

## 2017-07-02 NOTE — Assessment & Plan Note (Signed)
She agreed to a flu vaccine today.  She declined a Pap smear and a mammogram today.

## 2017-07-02 NOTE — Telephone Encounter (Signed)
Needs to speak with a nurse. Please call back.  

## 2017-07-02 NOTE — Progress Notes (Signed)
   Subjective:    Patient ID: Kathy Owens, female    DOB: Nov 16, 1955, 61 y.o.   MRN: [id number]  HPI  Kathy Owens is here for vertigo F/U. Please see the A&P for the status of the pt's chronic medical problems.  ROS : per ROS section and in problem oriented charting. All other systems are negative.  PMHx, Soc hx, and / or Fam hx : Sister lives in [place] and comes to [facility] also. Charleston Ropes is an alcoholic and is 10 yrs younger.  Review of Systems  Constitutional: Positive for fatigue.  Eyes: Positive for visual disturbance.  Respiratory:       Does wake up sometimes snoring.  Musculoskeletal: Positive for gait problem.       L leg spasms.  Neurological: Positive for weakness. Negative for dizziness, light-headedness and headaches.  Psychiatric/Behavioral: Positive for sleep disturbance.       Objective:   Physical Exam  Constitutional: She appears well-developed and well-nourished. No distress.  HENT:  Head: Normocephalic and atraumatic.  Right Ear: External ear normal.  Left Ear: External ear normal.  Nose: Nose normal.  Eyes: Conjunctivae and EOM are normal. Right eye exhibits no discharge. Left eye exhibits no discharge. No scleral icterus.  Cardiovascular: Normal rate and normal heart sounds.   No murmur heard. Pulmonary/Chest: Effort normal and breath sounds normal. No respiratory distress.  Musculoskeletal: Normal range of motion. She exhibits edema and deformity. She exhibits no tenderness.  Neurological: She is alert.  Requires walker. L leg ext rotated. Requires assistance standing  Skin: Skin is warm and dry. She is not diaphoretic.  Psychiatric: She has a normal mood and affect. Her behavior is normal. Judgment and thought content normal.      Assessment & Plan:

## 2017-07-02 NOTE — Patient Instructions (Signed)
1. I gave you three paper prescriptions for your pain medicine 2. Your blood pressure is great 3. Let me know if you want at sleep study

## 2017-07-02 NOTE — Assessment & Plan Note (Signed)
This problem is chronic and stable. She continues to take Fosamax weekly. She has no side effects to this medication. She is not walking as much due to her decreased vision and inability to see curbs or curb cuts.  PLAN:  Cont current meds

## 2017-07-02 NOTE — Assessment & Plan Note (Signed)
This problem is chronic and stable. Previously, she had failed converting from a PPI to H2 blocker. Today she states that she went back on H2 blocker, twice a day, and is doing quite well on this medication. She prefers the H2 blocker because it is less harsh on her kidneys. She requests a refill of the ranitidine 150 twice a day.  PLAN : stop omeprazole Ranitidine 150 twice a day

## 2017-07-02 NOTE — Assessment & Plan Note (Signed)
This problem is chronic and stable. She is on hydrocodone 10 mg and gets 120 per month. She is due for a prescription in mid October. She has never had any red or yellow behavior. Her urine drug screen in September 2017 was appropriate. Her [place] controlled database report today was appropriate.  PLAN : I gave her 3 papers prescriptions for her hydrocodone I will need a urine drug screen at her next appointment

## 2017-07-02 NOTE — Assessment & Plan Note (Signed)
This problem is chronic and stable. She is on Lipitor 10, a moderate intensity statin, for primary prevention. She has no side effects to this medication. Her 10 year Framingham risk is 2% so she likely does not even need a statin. Her recent MRI showed microvascular ischemic changes which are chronic so I will leave the statin as is.  PLAN:  Cont current meds

## 2017-07-02 NOTE — Assessment & Plan Note (Signed)
His problem is chronic and stable. I pulled up her ophthalmologist note and reviewed to the best of my ability the condition, be associated conditions, and prognosis. We discussed sleep apnea. She does snore and falls asleep in front of the TV. She is not interested in getting a sleep study at this time because she relies on others for transportation.  PLAN : continue to ask about sleep studies in the future

## 2017-07-02 NOTE — Assessment & Plan Note (Signed)
This problem is chronic and stable. She states her left side has always been weaker than the right. She has decreased her walking due to vision issues and fall risk. She had been having trouble standing up independently at her last appointment but today she states that is better.  Today, she complains of left leg spasms. She request a muscle relaxer because her surgeon had given her one 2-3 years ago after her hip surgery. On further questioning, this occurs while she is in bed and is described as her toes and foot going up. She then has to stand up and walk around and it resolves in 1-2 minutes. This sounds like a charley horse. I explained that the risk of a muscle relaxer in combination with her age, cerebral palsy, hydrocodone, and meclizine, would increase the risk of her unsteadiness and falls. I also explained that by the time she stood up walked to the bathroom, got her pill and a glass of water and took it, that the spasms are being gone. She agrees not to take a muscle relaxer.  She has a walker which she got when a resident of her ability died. She asked about getting another one. I asked whether the current one is not working well and she stated it was and that we would not pursue at this time.  PLAN : cont to follow

## 2017-08-24 ENCOUNTER — Telehealth: Payer: Self-pay | Admitting: Internal Medicine

## 2017-08-24 NOTE — Telephone Encounter (Signed)
   Reason for call:   I received a call from [name] at 7:30 PM indicating that she wants to verify whether her sister who is not our patient can have a flu shot ?Marland Kitchen   Pertinent Data:   Mrs. chart and was asking that she had a sister who is not our patient but drinks a lot of alcohol, can she get a flu shot?    Assessment / Plan / Recommendations:   I explained to her that I cannot advise about her sister as she is not a known patient and I'm not familiar with her health conditions, she should contact her own physician if she has any questions.If she wants to establish with [place] she can come to the clinic and we can answer her questions at that time .  As always, pt is advised that if symptoms worsen or new symptoms arise, they should go to an urgent care facility or to to ER for further evaluation.   Lorella Nimrod, MD   08/24/2017, 9:44 PM

## 2017-08-26 ENCOUNTER — Encounter: Payer: Self-pay | Admitting: *Deleted

## 2017-09-08 ENCOUNTER — Other Ambulatory Visit: Payer: Self-pay | Admitting: Internal Medicine

## 2017-09-08 DIAGNOSIS — M8000XA Age-related osteoporosis with current pathological fracture, unspecified site, initial encounter for fracture: Secondary | ICD-10-CM

## 2017-09-30 ENCOUNTER — Ambulatory Visit: Payer: Medicare Other | Admitting: Podiatry

## 2017-10-07 ENCOUNTER — Ambulatory Visit: Payer: Self-pay | Admitting: Internal Medicine

## 2017-10-08 ENCOUNTER — Ambulatory Visit: Payer: Self-pay | Admitting: Internal Medicine

## 2017-11-10 ENCOUNTER — Ambulatory Visit: Payer: Medicare Other | Admitting: Podiatry

## 2017-11-12 ENCOUNTER — Other Ambulatory Visit: Payer: Self-pay

## 2017-11-12 ENCOUNTER — Encounter: Payer: Self-pay | Admitting: Internal Medicine

## 2017-11-12 ENCOUNTER — Ambulatory Visit (INDEPENDENT_AMBULATORY_CARE_PROVIDER_SITE_OTHER): Payer: Medicare Other | Admitting: Internal Medicine

## 2017-11-12 DIAGNOSIS — H47013 Ischemic optic neuropathy, bilateral: Secondary | ICD-10-CM

## 2017-11-12 DIAGNOSIS — I129 Hypertensive chronic kidney disease with stage 1 through stage 4 chronic kidney disease, or unspecified chronic kidney disease: Secondary | ICD-10-CM

## 2017-11-12 DIAGNOSIS — N183 Chronic kidney disease, stage 3 unspecified: Secondary | ICD-10-CM

## 2017-11-12 DIAGNOSIS — G8929 Other chronic pain: Secondary | ICD-10-CM | POA: Diagnosis not present

## 2017-11-12 DIAGNOSIS — Z791 Long term (current) use of non-steroidal anti-inflammatories (NSAID): Secondary | ICD-10-CM | POA: Diagnosis not present

## 2017-11-12 DIAGNOSIS — Z79899 Other long term (current) drug therapy: Secondary | ICD-10-CM

## 2017-11-12 DIAGNOSIS — K219 Gastro-esophageal reflux disease without esophagitis: Secondary | ICD-10-CM

## 2017-11-12 DIAGNOSIS — M810 Age-related osteoporosis without current pathological fracture: Secondary | ICD-10-CM

## 2017-11-12 DIAGNOSIS — G894 Chronic pain syndrome: Secondary | ICD-10-CM

## 2017-11-12 DIAGNOSIS — G809 Cerebral palsy, unspecified: Secondary | ICD-10-CM

## 2017-11-12 DIAGNOSIS — G808 Other cerebral palsy: Secondary | ICD-10-CM

## 2017-11-12 DIAGNOSIS — I10 Essential (primary) hypertension: Secondary | ICD-10-CM

## 2017-11-12 DIAGNOSIS — R42 Dizziness and giddiness: Secondary | ICD-10-CM

## 2017-11-12 DIAGNOSIS — Z7983 Long term (current) use of bisphosphonates: Secondary | ICD-10-CM

## 2017-11-12 NOTE — Patient Instructions (Signed)
1. Pls see me in 3-6 month 2. Pls let me know if you need anything 3. Try tylenol 500 mg up to 4 times a day

## 2017-11-12 NOTE — Assessment & Plan Note (Signed)
This problem is chronic and stable. She made a successful conversion from PPI to H2 blocker and is now on ranitidine twice a day. She has no symptoms while taking her H2 blocker.  PLAN:  Cont current meds

## 2017-11-12 NOTE — Assessment & Plan Note (Signed)
This problem is chronic and stable. She is tolerating her maxzide without any side effects.  PLAN:  Cont current meds   BP Readings from Last 3 Encounters:  11/12/17 124/75  07/02/17 138/80  06/24/17 139/73

## 2017-11-12 NOTE — Progress Notes (Signed)
   Subjective:    Patient ID: Kathy Owens, female    DOB: 12-Nov-1955, 62 y.o.   MRN: [id number]  HPI  Kathy Owens is here for pain F/U. Please see the A&P for the status of the pt's chronic medical problems.  ROS : per ROS section and in problem oriented charting. All other systems are negative.  PMHx, Soc hx, and / or Fam hx : She did not travel over the holidays. Her sister days he is not drinking currently because she has been money to buy alcohol. She states her sister days he has had psoriasis since she was 62 years old.   Review of Systems  HENT: Negative for rhinorrhea.   Eyes: Positive for photophobia and visual disturbance. Negative for itching.  Musculoskeletal: Positive for arthralgias and gait problem.  Neurological: Negative for dizziness and weakness.  Psychiatric/Behavioral: Negative for sleep disturbance.       Objective:   Physical Exam  Constitutional: She appears well-developed and well-nourished. No distress.  HENT:  Head: Normocephalic and atraumatic.  Right Ear: External ear normal.  Left Ear: External ear normal.  Nose: Nose normal.  Eyes: Conjunctivae and EOM are normal.  Neurological: She is alert.  Skin: She is not diaphoretic.  Psychiatric: She has a normal mood and affect. Her behavior is normal. Judgment and thought content normal.      Assessment & Plan:

## 2017-11-12 NOTE — Assessment & Plan Note (Signed)
This problem is chronic and stable. She elected to stop her hydrocodone about 2 months ago because she felt it was making her dizzy. Since she stopped her hydrocodone, she feels her dizziness is better. She is still achy all over that she can "live with it." She takes naproxen one pill a day.  She elected to stop the hydrocodone. She had no behavior abnormalities that necessitated her coming off hydrocodone. She would like to reserve the possibility of resuming a pain medicine, maybe 1 not quite as strong as hydrocodone 10 mg, in the future in case her pain gets worse. I assured her this would be possible but that she would need an appointment to talk with me before resuming any opioid.  As the naproxen is not quite adequate, I discussed that she could take Tylenol 500 mg up to 4 times a day in combination with the naproxen. She is in agreement to do this.  PLAN : She elected herself to stop her hydrocodone Naproxen once a day and follow kidney function Tylenol 500 4 times a day when necessary

## 2017-11-12 NOTE — Assessment & Plan Note (Signed)
This problem is chronic and stable. Since she is on an nonsteroidal daily, I will need to keep close eye on her kidney function. Her last creatinine was in September and was stable. I will plan to check edema at her next appointment.  PLAN : Follow kidney function

## 2017-11-12 NOTE — Assessment & Plan Note (Signed)
This problem is chronic and stable. She is tolerating her alendronate without any difficulty. I encouraged her to continue to take her calcium and vitamin D.  PLAN:  Cont current meds

## 2017-11-12 NOTE — Assessment & Plan Note (Signed)
This problem is chronic and stable. I again brought up the recommendation for a sleep study but she feels that she is sleeping well and is not yet interested in completing a sleep study.   PLAN : Continue to follow

## 2017-11-12 NOTE — Assessment & Plan Note (Signed)
This problem is chronic and improved. She stopped her hydrocodone as she felt it was causing her to have dizziness. She has not needed her meclizine in quite some time as she has not had any vertigo recently.  PLAN : Meclizine as needed

## 2017-11-12 NOTE — Assessment & Plan Note (Signed)
This problem is chronic and stable. She continues to use her walker. She is able to stand up independently. She is no longer walking to the grocery store due to visual disturbances after her eye surgery. Anything that is a light color will appear wavy and blurry to her and she is quite afraid of tripping over a curb or a light-colored floor. Her friend drives her to the grocery store but she does go in herself to get her groceries. She had a course of physical therapy in her home but did not feel that the physical therapist respected her due to her cerebral palsy. I encouraged her to think about neuro rehabilitation therapy on his [address] and she said that she will think about it.  PLAN : Continue to follow

## 2017-11-15 ENCOUNTER — Other Ambulatory Visit: Payer: Self-pay | Admitting: Internal Medicine

## 2017-12-08 ENCOUNTER — Ambulatory Visit (INDEPENDENT_AMBULATORY_CARE_PROVIDER_SITE_OTHER): Payer: Medicare Other | Admitting: Podiatry

## 2017-12-08 ENCOUNTER — Encounter: Payer: Self-pay | Admitting: Podiatry

## 2017-12-08 DIAGNOSIS — B351 Tinea unguium: Secondary | ICD-10-CM

## 2017-12-08 NOTE — Progress Notes (Signed)
Complaint:  Visit Type: Patient returns to my office for continued preventative foot care services. Complaint: Patient states" my nails have grown long and thick and become painful to walk and wear shoes" Patient has been diagnosed with CP. The patient presents for preventative foot care services. No changes to ROS  Podiatric Exam: Vascular: dorsalis pedis and posterior tibial pulses are palpable bilateral. Capillary return is immediate. Temperature gradient is WNL. Skin turgor WNL  Sensorium: Normal Semmes Weinstein monofilament test. Normal tactile sensation bilaterally. Nail Exam: Pt has thick disfigured discolored nails with subungual debris noted bilateral entire nail hallux through fifth toenails Ulcer Exam: There is no evidence of ulcer or pre-ulcerative changes or infection. Orthopedic Exam: Muscle tone and strength are WNL. No limitations in general ROM. No crepitus or effusions noted. Flattened foot profile with prominent navicular tuberosity. STJ minimal right foot.  Severe HAV  B/L  Hammer toes 5th  B/L Skin: No Porokeratosis. No infection or ulcers  Diagnosis:  Onychomycosis, , Pain in right toe, pain in left toes  Treatment & Plan Procedures and Treatment: Consent by patient was obtained for treatment procedures.   Debridement of mycotic and hypertrophic toenails, 1 through 5 bilateral and clearing of subungual debris. No ulceration, no infection noted.  Return Visit-Office Procedure: Patient instructed to return to the office for a follow up visit 3 months for continued evaluation and treatment.    Gardiner Barefoot DPM

## 2017-12-21 DIAGNOSIS — H47013 Ischemic optic neuropathy, bilateral: Secondary | ICD-10-CM | POA: Diagnosis not present

## 2018-02-09 ENCOUNTER — Other Ambulatory Visit: Payer: Self-pay | Admitting: Internal Medicine

## 2018-02-09 DIAGNOSIS — I10 Essential (primary) hypertension: Secondary | ICD-10-CM

## 2018-02-09 DIAGNOSIS — E785 Hyperlipidemia, unspecified: Secondary | ICD-10-CM

## 2018-02-09 NOTE — Telephone Encounter (Signed)
Patient is requesting refills for Lipitor 10mg , Triamtrence-hydrochlorothiazide, and naprosyn 500mg , patient would like 90-day supply. Pls send to [facility] on [address].

## 2018-02-09 NOTE — Telephone Encounter (Signed)
This has been addressed in separate refill request sent to PCP today. Hubbard Hartshorn, RN, BSN

## 2018-02-11 ENCOUNTER — Ambulatory Visit: Payer: Self-pay | Admitting: Internal Medicine

## 2018-02-16 ENCOUNTER — Telehealth: Payer: Self-pay | Admitting: Internal Medicine

## 2018-02-16 NOTE — Telephone Encounter (Signed)
Agree with appt Thanks 

## 2018-02-16 NOTE — Telephone Encounter (Signed)
Patient is taking her medicine one week, she cant spell it or say it,  Her jaw is hurting and the pharmacy told her to stop taking and contact physician office. When patient chew food jaw bone is cracking, pls contact patient

## 2018-02-16 NOTE — Telephone Encounter (Signed)
rtc to pt, she states her jaw is causing her much problem, pharmacist told her not to take anymore of the alendronate until she sees the doctor, she must arrange a ride but is asking for Thursday appt [facility]. Please advise

## 2018-02-18 ENCOUNTER — Encounter: Payer: Self-pay | Admitting: Internal Medicine

## 2018-02-18 ENCOUNTER — Other Ambulatory Visit: Payer: Self-pay

## 2018-02-18 ENCOUNTER — Ambulatory Visit (INDEPENDENT_AMBULATORY_CARE_PROVIDER_SITE_OTHER): Payer: Medicare Other | Admitting: Internal Medicine

## 2018-02-18 VITALS — BP 136/79 | HR 76 | Temp 97.9°F | Ht 60.0 in | Wt 151.5 lb

## 2018-02-18 DIAGNOSIS — Z972 Presence of dental prosthetic device (complete) (partial): Secondary | ICD-10-CM

## 2018-02-18 DIAGNOSIS — Z7983 Long term (current) use of bisphosphonates: Secondary | ICD-10-CM

## 2018-02-18 DIAGNOSIS — R6884 Jaw pain: Secondary | ICD-10-CM | POA: Diagnosis not present

## 2018-02-18 DIAGNOSIS — R42 Dizziness and giddiness: Secondary | ICD-10-CM

## 2018-02-18 MED ORDER — MECLIZINE HCL 12.5 MG PO TABS: 12.5000 mg | ORAL_TABLET | Freq: Three times a day (TID) | ORAL | 0 refills | Status: DC | PRN
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
## 2018-02-18 NOTE — Assessment & Plan Note (Signed)
She has jaw pain highly consistent with TMJ dysfunction. I question if her loosely fitting dentures are contributing to this. She could also have some early arthritis. There is no muscle atrophy or tenderness. She has no concerning features for osteonecrosis or giant cell arteritis and these processes typically do not present waxing and waning in severity over a week. She has also only taken the alendronate since 2017 which is much shorter than generally shown to risk this complication.  Plan: Provided jaw exercises PDF to try for 2-3 weeks Recommend follow up with her dentist RTC if symptoms become significantly worse

## 2018-02-18 NOTE — Progress Notes (Signed)
Medicine attending: Medical history, presenting problems, physical findings, and medications, reviewed with resident physician Dr Christopher Rice on the day of the patient visit and I concur with his evaluation and management plan. 

## 2018-02-18 NOTE — Patient Instructions (Signed)
I believe your jaw pain is related to temporomandicular joint (TMJ) dysfunction. This is not a serious problem but can be very painful. I think it is very unlikely this is related to your medications.  I recommend trying some exercises for your jaw to help reduce the popping and pain with chewing.  I recommend following up with your dentist sometime soon to evaluate your symptoms and also make sure your dentures are an ideal fit.

## 2018-02-19 ENCOUNTER — Encounter: Payer: Self-pay | Admitting: Internal Medicine

## 2018-03-09 ENCOUNTER — Ambulatory Visit: Payer: Medicare Other | Admitting: Podiatry

## 2018-03-25 ENCOUNTER — Ambulatory Visit (INDEPENDENT_AMBULATORY_CARE_PROVIDER_SITE_OTHER): Payer: Medicare Other | Admitting: Internal Medicine

## 2018-03-25 ENCOUNTER — Encounter: Payer: Self-pay | Admitting: Internal Medicine

## 2018-03-25 ENCOUNTER — Other Ambulatory Visit: Payer: Self-pay

## 2018-03-25 VITALS — BP 135/73 | HR 72 | Temp 98.0°F | Ht 60.0 in | Wt 149.1 lb

## 2018-03-25 DIAGNOSIS — N183 Chronic kidney disease, stage 3 unspecified: Secondary | ICD-10-CM

## 2018-03-25 DIAGNOSIS — R6884 Jaw pain: Secondary | ICD-10-CM | POA: Diagnosis not present

## 2018-03-25 DIAGNOSIS — G479 Sleep disorder, unspecified: Secondary | ICD-10-CM | POA: Diagnosis not present

## 2018-03-25 DIAGNOSIS — G809 Cerebral palsy, unspecified: Secondary | ICD-10-CM | POA: Diagnosis not present

## 2018-03-25 DIAGNOSIS — Z0000 Encounter for general adult medical examination without abnormal findings: Secondary | ICD-10-CM

## 2018-03-25 DIAGNOSIS — K219 Gastro-esophageal reflux disease without esophagitis: Secondary | ICD-10-CM | POA: Diagnosis not present

## 2018-03-25 DIAGNOSIS — I10 Essential (primary) hypertension: Secondary | ICD-10-CM | POA: Diagnosis not present

## 2018-03-25 DIAGNOSIS — E785 Hyperlipidemia, unspecified: Secondary | ICD-10-CM

## 2018-03-25 DIAGNOSIS — H539 Unspecified visual disturbance: Secondary | ICD-10-CM

## 2018-03-25 DIAGNOSIS — Z7982 Long term (current) use of aspirin: Secondary | ICD-10-CM | POA: Diagnosis not present

## 2018-03-25 DIAGNOSIS — G8929 Other chronic pain: Secondary | ICD-10-CM | POA: Diagnosis not present

## 2018-03-25 DIAGNOSIS — I129 Hypertensive chronic kidney disease with stage 1 through stage 4 chronic kidney disease, or unspecified chronic kidney disease: Secondary | ICD-10-CM

## 2018-03-25 DIAGNOSIS — E7800 Pure hypercholesterolemia, unspecified: Secondary | ICD-10-CM

## 2018-03-25 DIAGNOSIS — H6121 Impacted cerumen, right ear: Secondary | ICD-10-CM

## 2018-03-25 DIAGNOSIS — L853 Xerosis cutis: Secondary | ICD-10-CM

## 2018-03-25 DIAGNOSIS — G894 Chronic pain syndrome: Secondary | ICD-10-CM

## 2018-03-25 DIAGNOSIS — Z791 Long term (current) use of non-steroidal anti-inflammatories (NSAID): Secondary | ICD-10-CM

## 2018-03-25 DIAGNOSIS — Z79899 Other long term (current) drug therapy: Secondary | ICD-10-CM

## 2018-03-25 MED ORDER — CARBAMIDE PEROXIDE 6.5 % OT SOLN: 5.0000 [drp] | Freq: Two times a day (BID) | OTIC | 0 refills | Status: DC

## 2018-03-25 NOTE — Assessment & Plan Note (Signed)
Declined a mammogram today  NEW PROBLEM She complains of 2 weeks of decreased hearing bilaterally.  She states this is occurred in the past and was due to wax.  Glenda, RN, was able to irrigate her ears and got some wax out of the right ear.  However the ear started to hurt and Holley Raring was not comfortable continuing.  I then examined the ears and there is still some wax remaining on the right ear.  There is no wax in the left.  Both ears have a small amount of blood in the inferior canal proximally.  This appears to be fresh and likely from recent trauma.  She still has difficulty hearing and wanted further treatment.  I offered 2 options.  First was ENT for vacuum suction of the wax under visualization.  She chose a second option which was drops in her ears to help dissolve the wax.  PLAN : Ceruminolytic R ear x 5 days

## 2018-03-25 NOTE — Assessment & Plan Note (Signed)
This problem is chronic and controlled.  As documented in my last note, she elected to stop hydrocodone.  She is taking over-the-counter naproxen daily.  She is taking no other over-the-counter pain relievers.  She is taking the naproxen with food.  She feels this is doing well enough although she still has some occasional pain.  Since she is taking a nonsteroidal daily and has some chronic renal dysfunction with a GFR of 48, I will check a be met today to ensure stability.  PLAN : naproxen daily BMP
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
## 2018-03-25 NOTE — Patient Instructions (Signed)
1. Your blood pressure is good. 2. I will mail you your test results 3. Try the cream on your dry skin 4. See me in 4-7 months 5. I will mail you your test results

## 2018-03-25 NOTE — Assessment & Plan Note (Signed)
This problem is chronic and stable.  She is on Lipitor 10 mg a moderate intensity statin for primary prevention.  Her 10-year cardiovascular risk cannot be detected.  Her last lipid panel was in 2015 and her LDL was 79 and her HDL was 65.  I have debated whether I should recommend asking her to stop the statin and the aspirin, also for primary prevention.  My reasoning for continuing both is that she has cerebral palsy, baseline gait disturbance, but is overall independent.  A stroke would likely devastate her independence and therefore I am recommending she continue to take the statin and aspirin.  PLAN:  Cont current meds

## 2018-03-25 NOTE — Assessment & Plan Note (Signed)
This problem is chronic and stable.  It occurs bilaterally and comes and goes.  It only occurs while Kathy Owens is chewing. Recently Kathy Owens saw Dr. Benjamine Mola who felt this was TMJ and gave her exercises and recommended that Kathy Owens see her dentist.  Kathy Owens has been unable to see her dentist and the symptoms do not seem to worrisome today.  PLAN : follow TMJ sxs

## 2018-03-25 NOTE — Assessment & Plan Note (Signed)
This problem is chronic and stable.  Her blood pressure has always been controlled on her Maxide.  Her blood pressure remains controlled today.  She has no symptoms on this medication.  PLAN:  Cont current meds BMP  BP Readings from Last 3 Encounters:  03/25/18 135/73  02/18/18 136/79  11/12/17 124/75

## 2018-03-25 NOTE — Assessment & Plan Note (Signed)
This problem is chronic and stable.  She is on a nonsteroidal daily but her creatinine has been stable for years on this.  I will be checking a BMP 6 to 12 months.  PLAN : BMP

## 2018-03-25 NOTE — Assessment & Plan Note (Signed)
This problem is chronic and stable.  She is doing well on her H2 blocker twice daily.  She has no side effects to this medication and no symptoms of GERD.  PLAN:  Cont current meds

## 2018-03-26 ENCOUNTER — Telehealth: Payer: Self-pay | Admitting: Internal Medicine

## 2018-03-26 ENCOUNTER — Other Ambulatory Visit: Payer: Self-pay | Admitting: Internal Medicine

## 2018-03-26 ENCOUNTER — Encounter: Payer: Self-pay | Admitting: Internal Medicine

## 2018-03-26 DIAGNOSIS — N183 Chronic kidney disease, stage 3 unspecified: Secondary | ICD-10-CM

## 2018-03-26 LAB — BMP8+ANION GAP
ANION GAP: 18 mmol/L (ref 10.0–18.0)
BUN/Creatinine Ratio: 24 (ref 12–28)
BUN: 33 mg/dL — AB (ref 8–27)
CO2: 20 mmol/L (ref 20–29)
CREATININE: 1.35 mg/dL — AB (ref 0.57–1.00)
Calcium: 10 mg/dL (ref 8.7–10.3)
Chloride: 104 mmol/L (ref 96–106)
GFR calc Af Amer: 48 mL/min/{1.73_m2} — ABNORMAL LOW (ref >59)
GFR calc non Af Amer: 42 mL/min/{1.73_m2} — ABNORMAL LOW (ref >59)
Glucose: 86 mg/dL (ref 65–99)
Potassium: 3.8 mmol/L (ref 3.5–5.2)
Sodium: 142 mmol/L (ref 134–144)

## 2018-03-26 NOTE — Telephone Encounter (Signed)
Called pt No answer Have letter to send

## 2018-03-26 NOTE — Telephone Encounter (Signed)
Patient is checking on lab results, pls call pt

## 2018-03-31 ENCOUNTER — Telehealth: Payer: Self-pay

## 2018-03-31 NOTE — Telephone Encounter (Signed)
Requesting lab result. Please call pt back.  

## 2018-04-01 NOTE — Telephone Encounter (Signed)
Patient is calling back regarding her lab results, please call patient back

## 2018-04-02 NOTE — Telephone Encounter (Signed)
Patient called in concerned over recent letter with lab results from PCP. Results reviewed with patient. Explained Ref Ranges. Repeat lab draw scheduled for 04/15/2048 (3 weeks). Encouraged patient not to worry. Patient wanted to ensure she is still in Stage 3 CKD. Hubbard Hartshorn, RN, BSN

## 2018-04-09 ENCOUNTER — Ambulatory Visit (INDEPENDENT_AMBULATORY_CARE_PROVIDER_SITE_OTHER): Payer: Medicare Other | Admitting: Podiatry

## 2018-04-09 ENCOUNTER — Encounter: Payer: Self-pay | Admitting: Podiatry

## 2018-04-09 DIAGNOSIS — G808 Other cerebral palsy: Secondary | ICD-10-CM

## 2018-04-09 DIAGNOSIS — M79675 Pain in left toe(s): Secondary | ICD-10-CM | POA: Diagnosis not present

## 2018-04-09 DIAGNOSIS — B351 Tinea unguium: Secondary | ICD-10-CM

## 2018-04-09 DIAGNOSIS — M79674 Pain in right toe(s): Secondary | ICD-10-CM

## 2018-04-09 NOTE — Progress Notes (Signed)
Complaint:  Visit Type: Patient returns to my office for continued preventative foot care services. Complaint: Patient states" my nails have grown long and thick and become painful to walk and wear shoes" Patient has been diagnosed with CP. The patient presents for preventative foot care services. No changes to ROS.  Patient has cerebral palsy.  Podiatric Exam: Vascular: dorsalis pedis and posterior tibial pulses are palpable bilateral. Capillary return is immediate. Temperature gradient is WNL. Skin turgor WNL  Sensorium: Normal Semmes Weinstein monofilament test. Normal tactile sensation bilaterally. Nail Exam: Pt has thick disfigured discolored nails with subungual debris noted bilateral entire nail hallux through fifth toenails Ulcer Exam: There is no evidence of ulcer or pre-ulcerative changes or infection. Orthopedic Exam: Muscle tone and strength are WNL. No limitations in general ROM. No crepitus or effusions noted. Flattened foot profile with prominent navicular tuberosity. STJ minimal right foot.  Severe HAV  B/L  Hammer toes 5th  B/L Skin: No Porokeratosis. No infection or ulcers  Diagnosis:  Onychomycosis, , Pain in right toe, pain in left toes  Treatment & Plan Procedures and Treatment: Consent by patient was obtained for treatment procedures.   Debridement of mycotic and hypertrophic toenails, 1 through 5 bilateral and clearing of subungual debris. No ulceration, no infection noted.  Return Visit-Office Procedure: Patient instructed to return to the office for a follow up visit 3 months for continued evaluation and treatment.    Jazzmyne Rasnick DPM 

## 2018-04-13 ENCOUNTER — Telehealth: Payer: Self-pay

## 2018-04-13 NOTE — Telephone Encounter (Signed)
Pt would like to get a refill on Vicodin. Please call pt back.

## 2018-04-14 MED ORDER — HYDROCODONE-ACETAMINOPHEN 5-325 MG PO TABS: 1.0000 | ORAL_TABLET | ORAL | 0 refills | Status: DC | PRN

## 2018-04-14 NOTE — Telephone Encounter (Signed)
Wants somethinng maybe less strong. We discussed tramadol vs lower dose hydrocodone. She elected the latter. #120 but explained only PRN for pain interfering with life.

## 2018-04-14 NOTE — Telephone Encounter (Signed)
Has stopped the naproxen 2/2 renal fxn. Only took the naproxen. Pain now up.

## 2018-04-15 ENCOUNTER — Other Ambulatory Visit (INDEPENDENT_AMBULATORY_CARE_PROVIDER_SITE_OTHER): Payer: Medicare Other

## 2018-04-15 DIAGNOSIS — N183 Chronic kidney disease, stage 3 unspecified: Secondary | ICD-10-CM

## 2018-04-16 ENCOUNTER — Encounter: Payer: Self-pay | Admitting: Internal Medicine

## 2018-04-16 LAB — BMP8+ANION GAP
Anion Gap: 18 mmol/L (ref 10.0–18.0)
BUN/Creatinine Ratio: 21 (ref 12–28)
BUN: 22 mg/dL (ref 8–27)
CO2: 22 mmol/L (ref 20–29)
Calcium: 9.6 mg/dL (ref 8.7–10.3)
Chloride: 103 mmol/L (ref 96–106)
Creatinine, Ser: 1.05 mg/dL — ABNORMAL HIGH (ref 0.57–1.00)
GFR calc non Af Amer: 57 mL/min/{1.73_m2} — ABNORMAL LOW (ref >59)
GFR, EST AFRICAN AMERICAN: 65 mL/min/{1.73_m2} (ref >59)
GLUCOSE: 89 mg/dL (ref 65–99)
Potassium: 3.8 mmol/L (ref 3.5–5.2)
Sodium: 143 mmol/L (ref 134–144)

## 2018-04-26 ENCOUNTER — Telehealth: Payer: Self-pay

## 2018-04-26 NOTE — Telephone Encounter (Signed)
Requesting to speak with a nurse about lab result. Please call pt back.  

## 2018-04-27 NOTE — Telephone Encounter (Signed)
Reviewed contents of letter from PCP with recent lab results. Patient very appreciative. Hubbard Hartshorn, RN, BSN

## 2018-05-04 ENCOUNTER — Encounter (HOSPITAL_COMMUNITY): Payer: Self-pay | Admitting: Emergency Medicine

## 2018-05-04 ENCOUNTER — Emergency Department (HOSPITAL_COMMUNITY)
Admission: EM | Admit: 2018-05-04 | Discharge: 2018-05-05 | Disposition: A | Discharge status: Home / Self Care | Payer: Medicare Other | Attending: Emergency Medicine | Admitting: Emergency Medicine

## 2018-05-04 ENCOUNTER — Emergency Department (HOSPITAL_COMMUNITY): Payer: Medicare Other

## 2018-05-04 DIAGNOSIS — R0789 Other chest pain: Secondary | ICD-10-CM | POA: Diagnosis not present

## 2018-05-04 DIAGNOSIS — I10 Essential (primary) hypertension: Secondary | ICD-10-CM | POA: Diagnosis not present

## 2018-05-04 DIAGNOSIS — Z7982 Long term (current) use of aspirin: Secondary | ICD-10-CM | POA: Diagnosis not present

## 2018-05-04 DIAGNOSIS — R079 Chest pain, unspecified: Secondary | ICD-10-CM | POA: Diagnosis not present

## 2018-05-04 DIAGNOSIS — Z79899 Other long term (current) drug therapy: Secondary | ICD-10-CM | POA: Insufficient documentation

## 2018-05-04 LAB — BASIC METABOLIC PANEL
Anion gap: 12 (ref 5–15)
BUN: 21 mg/dL (ref 8–23)
CALCIUM: 9.4 mg/dL (ref 8.9–10.3)
CHLORIDE: 104 mmol/L (ref 98–111)
CO2: 23 mmol/L (ref 22–32)
CREATININE: 1.17 mg/dL — AB (ref 0.44–1.00)
GFR calc non Af Amer: 49 mL/min — ABNORMAL LOW (ref >60)
GFR, EST AFRICAN AMERICAN: 56 mL/min — AB (ref >60)
Glucose, Bld: 92 mg/dL (ref 70–99)
Potassium: 3.6 mmol/L (ref 3.5–5.1)
SODIUM: 139 mmol/L (ref 135–145)

## 2018-05-04 LAB — CBC
HCT: 39.7 % (ref 36.0–46.0)
HEMOGLOBIN: 12.9 g/dL (ref 12.0–15.0)
MCH: 29.4 pg (ref 26.0–34.0)
MCHC: 32.5 g/dL (ref 30.0–36.0)
MCV: 90.4 fL (ref 78.0–100.0)
PLATELETS: 202 10*3/uL (ref 150–400)
RBC: 4.39 MIL/uL (ref 3.87–5.11)
RDW: 13.2 % (ref 11.5–15.5)
WBC: 6.6 10*3/uL (ref 4.0–10.5)

## 2018-05-04 LAB — I-STAT TROPONIN, ED: Troponin i, poc: 0 ng/mL (ref 0.00–0.08)

## 2018-05-04 NOTE — ED Triage Notes (Signed)
Per EMS- pt from home for c.o. indigestion like chest pain that started about an hour ago, resolved upon EMS arrival without intervention. Pt states she feels fine now. Pt has CP, uses a roll walker, has obvious deformity at baseline to ankles. High fall risk.

## 2018-05-05 DIAGNOSIS — R279 Unspecified lack of coordination: Secondary | ICD-10-CM | POA: Diagnosis not present

## 2018-05-05 DIAGNOSIS — Z743 Need for continuous supervision: Secondary | ICD-10-CM | POA: Diagnosis not present

## 2018-05-05 DIAGNOSIS — R0789 Other chest pain: Secondary | ICD-10-CM | POA: Diagnosis not present

## 2018-05-05 LAB — I-STAT TROPONIN, ED: Troponin i, poc: 0 ng/mL (ref 0.00–0.08)

## 2018-05-05 NOTE — ED Notes (Signed)
Contacted [facility] for tx home
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
## 2018-05-11 ENCOUNTER — Other Ambulatory Visit: Payer: Self-pay | Admitting: Internal Medicine

## 2018-05-11 NOTE — Telephone Encounter (Signed)
pls sch appt Oct - Jan (her choice) with me

## 2018-05-12 ENCOUNTER — Other Ambulatory Visit: Payer: Self-pay | Admitting: Oncology

## 2018-05-12 ENCOUNTER — Other Ambulatory Visit: Payer: Self-pay | Admitting: Internal Medicine

## 2018-05-12 NOTE — Telephone Encounter (Signed)
rtc to pharmacist at [facility], he states pt told him that dr Software engineer requested that pt have pharmacist call each time for refills of hydrocodone. He was read last visit note about pt requesting to stop hydrocodone, he states she just told him this and he called after their conversation. Called pt, she states she and you agreed to restart hydrocodone, I found the note on 6/25 after speaking with her but it has fallen off the medlist. She would like a refill and states it worked well

## 2018-05-12 NOTE — Telephone Encounter (Signed)
[facility] called regarding hydrocodone, pls call [phone]

## 2018-05-12 NOTE — Telephone Encounter (Signed)
Refill request on hydrocodone to be filled @ [facility] on [address].

## 2018-05-13 MED ORDER — HYDROCODONE-ACETAMINOPHEN 5-325 MG PO TABS: 1.0000 | ORAL_TABLET | Freq: Four times a day (QID) | ORAL | 0 refills | Status: DC | PRN

## 2018-05-13 NOTE — Telephone Encounter (Addendum)
Notified patient of below. States she understands. States she is no longer taking naproxen so needs vicodin about twice daily. Is very appreciative of PCP. Hubbard Hartshorn, RN, BSN

## 2018-05-13 NOTE — Telephone Encounter (Signed)
Done. Pls remind her she does not have to take four times a day - just as needed

## 2018-05-13 NOTE — Telephone Encounter (Signed)
Call from pt - requesting Hydrocodone 5 mg tabs; states 10 mg was too strong. Told her I will pass on her request to Dr Lynnae January.

## 2018-06-14 ENCOUNTER — Other Ambulatory Visit: Payer: Self-pay | Admitting: Internal Medicine

## 2018-06-14 MED ORDER — HYDROCODONE-ACETAMINOPHEN 5-325 MG PO TABS: 1.0000 | ORAL_TABLET | Freq: Four times a day (QID) | ORAL | 0 refills | Status: DC | PRN

## 2018-06-14 NOTE — Telephone Encounter (Signed)
Needs refill on  HYDROcodone-acetaminophen (NORCO/VICODIN) 5-325 MG tablet @ [facility]; pt contact# [phone]

## 2018-06-14 NOTE — Telephone Encounter (Signed)
Pls sch Oct CC Chronic pain.

## 2018-07-09 ENCOUNTER — Ambulatory Visit: Payer: Medicare Other | Admitting: Podiatry

## 2018-07-12 ENCOUNTER — Other Ambulatory Visit: Payer: Self-pay | Admitting: Internal Medicine

## 2018-07-12 NOTE — Telephone Encounter (Signed)
Needs refill on HYDROcodone-acetaminophen (NORCO/VICODIN) 5-325 MG tablet @ [facility] [address] ;pt contact # [phone]

## 2018-07-13 MED ORDER — HYDROCODONE-ACETAMINOPHEN 5-325 MG PO TABS: 1.0000 | ORAL_TABLET | Freq: Four times a day (QID) | ORAL | 0 refills | Status: DC | PRN

## 2018-07-19 IMAGING — CR DG CHEST 2V
2 series · 2 of 2 positions shown · non-contrast
Comparison: 06/24/2017

CLINICAL DATA: Chest pain.

EXAM:
CHEST - 2 VIEW

[chest lat]
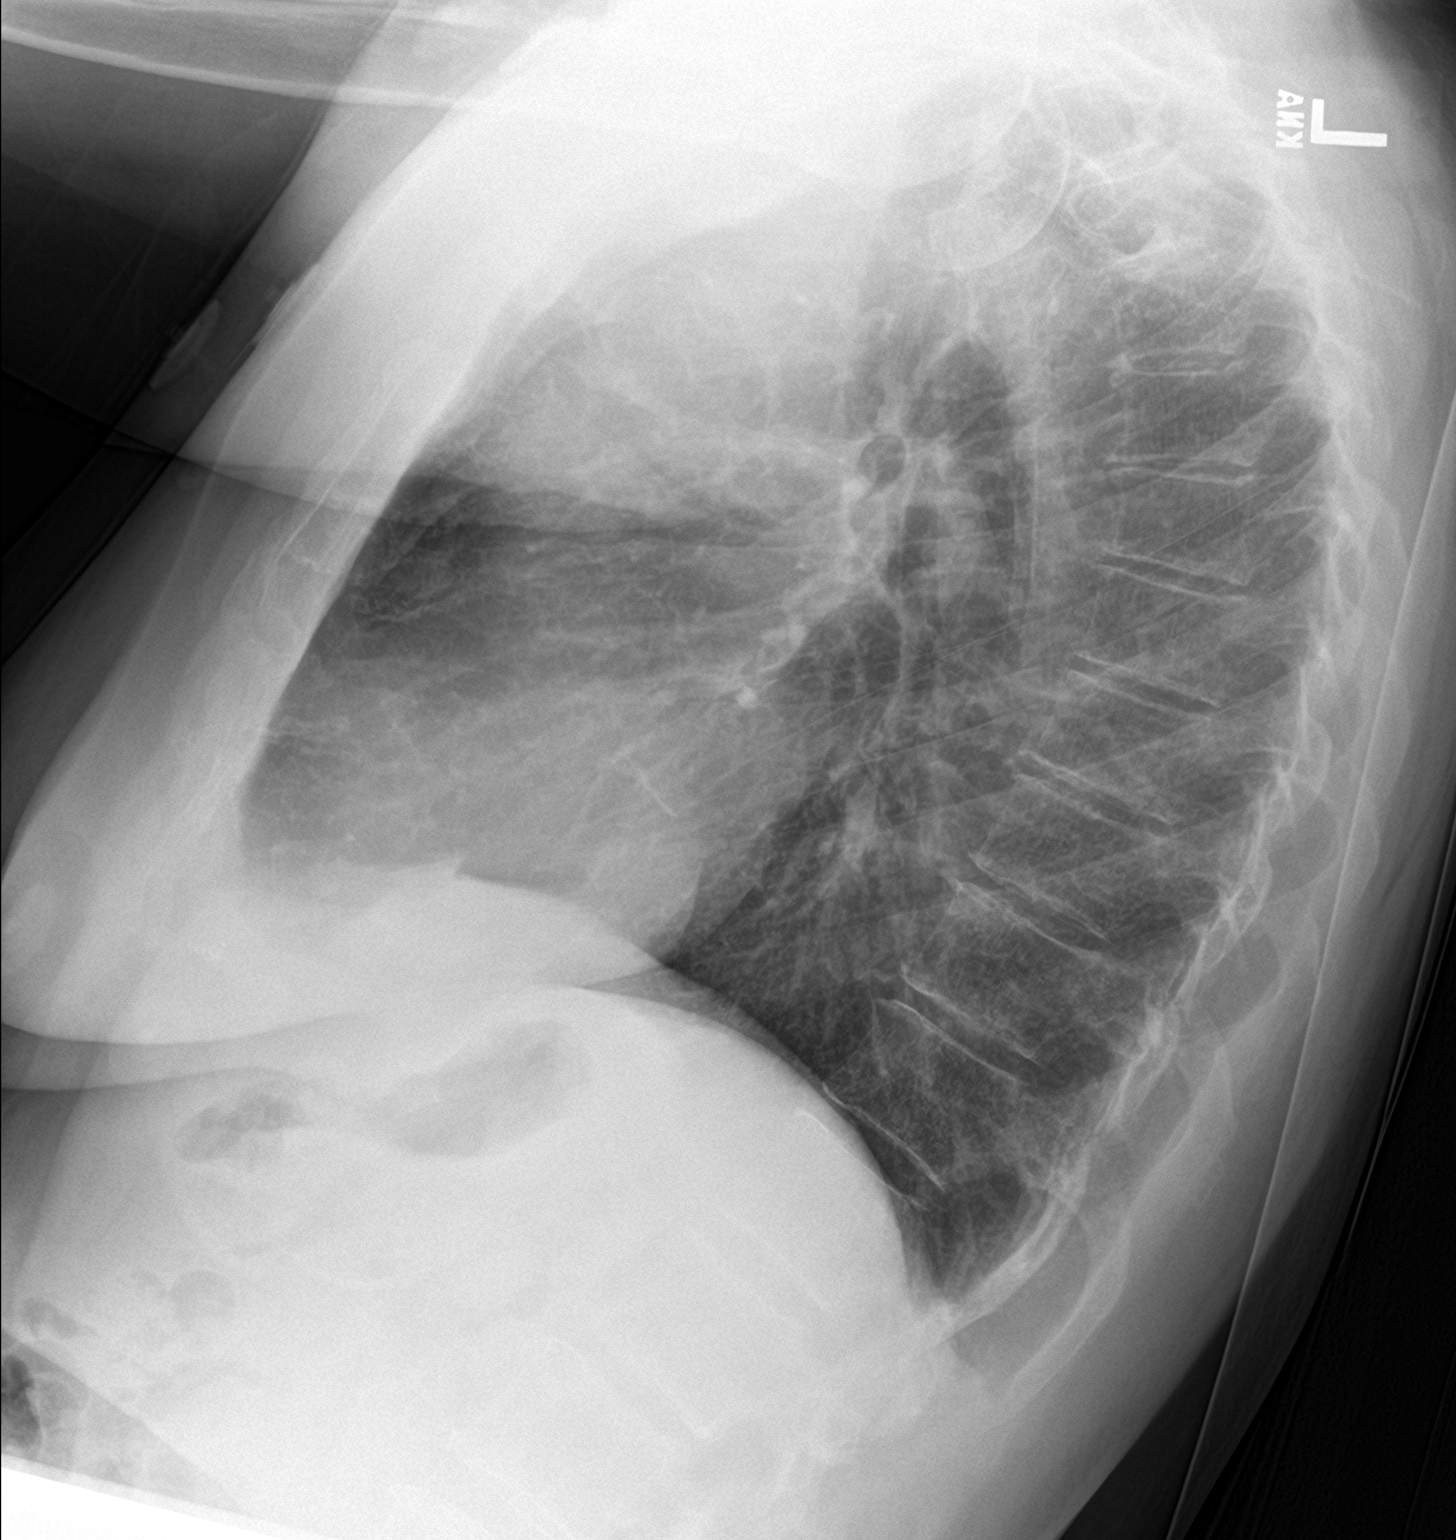

[chest ap]
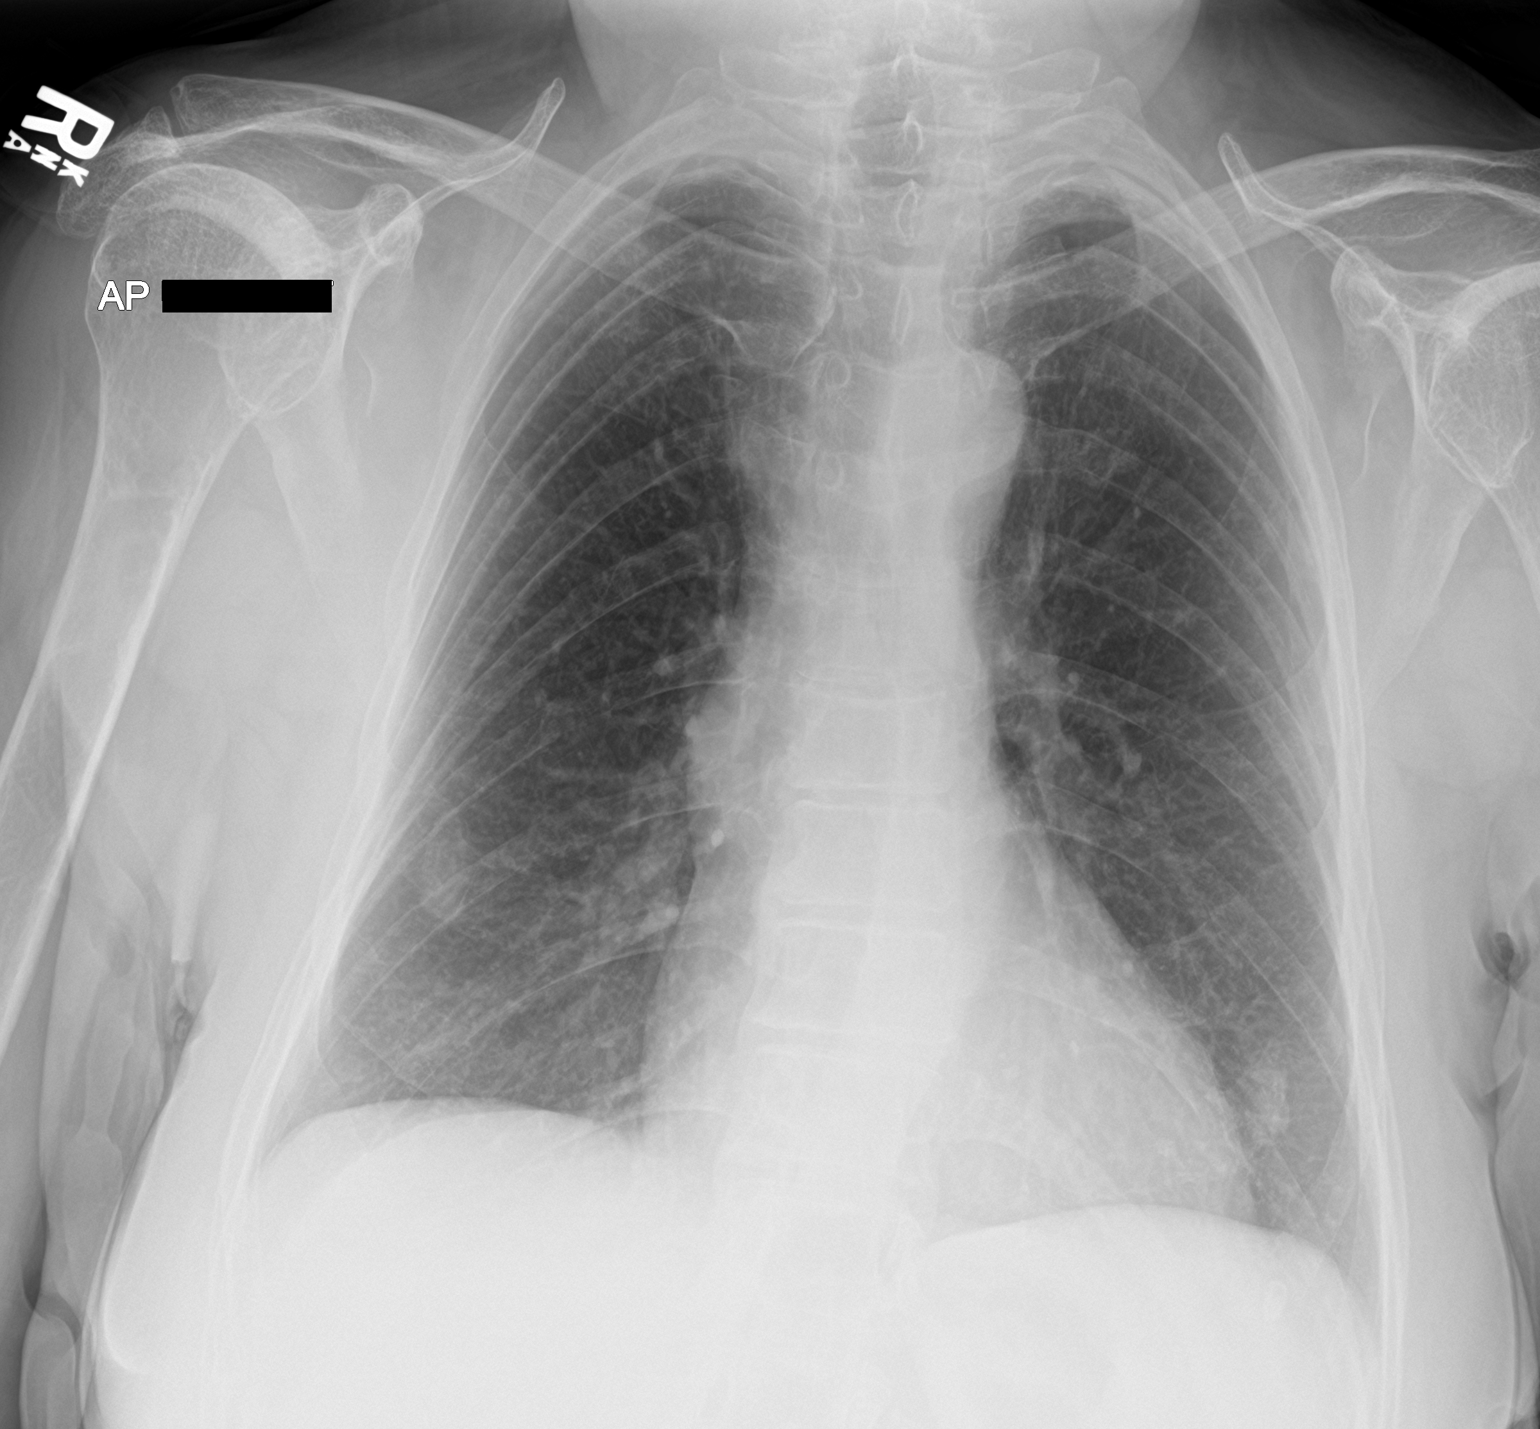

[2 of 2 positions shown; findings below may reference images not displayed]

FINDINGS: The heart size and mediastinal contours are within normal limits.
There is no evidence of pulmonary edema, consolidation,
pneumothorax, nodule or pleural fluid. The visualized skeletal
structures are unremarkable.
IMPRESSION: No active cardiopulmonary disease.

## 2018-07-29 ENCOUNTER — Ambulatory Visit (INDEPENDENT_AMBULATORY_CARE_PROVIDER_SITE_OTHER): Payer: Medicare Other | Admitting: Internal Medicine

## 2018-07-29 ENCOUNTER — Encounter: Payer: Self-pay | Admitting: Internal Medicine

## 2018-07-29 DIAGNOSIS — Z7189 Other specified counseling: Secondary | ICD-10-CM | POA: Insufficient documentation

## 2018-07-29 DIAGNOSIS — Z7982 Long term (current) use of aspirin: Secondary | ICD-10-CM

## 2018-07-29 DIAGNOSIS — N183 Chronic kidney disease, stage 3 unspecified: Secondary | ICD-10-CM

## 2018-07-29 DIAGNOSIS — M7989 Other specified soft tissue disorders: Secondary | ICD-10-CM

## 2018-07-29 DIAGNOSIS — G894 Chronic pain syndrome: Secondary | ICD-10-CM

## 2018-07-29 DIAGNOSIS — I10 Essential (primary) hypertension: Secondary | ICD-10-CM

## 2018-07-29 DIAGNOSIS — Z0000 Encounter for general adult medical examination without abnormal findings: Secondary | ICD-10-CM

## 2018-07-29 DIAGNOSIS — K219 Gastro-esophageal reflux disease without esophagitis: Secondary | ICD-10-CM

## 2018-07-29 DIAGNOSIS — E785 Hyperlipidemia, unspecified: Secondary | ICD-10-CM

## 2018-07-29 DIAGNOSIS — Z79891 Long term (current) use of opiate analgesic: Secondary | ICD-10-CM

## 2018-07-29 DIAGNOSIS — Z79899 Other long term (current) drug therapy: Secondary | ICD-10-CM

## 2018-07-29 DIAGNOSIS — M549 Dorsalgia, unspecified: Secondary | ICD-10-CM

## 2018-07-29 DIAGNOSIS — E7800 Pure hypercholesterolemia, unspecified: Secondary | ICD-10-CM

## 2018-07-29 DIAGNOSIS — M810 Age-related osteoporosis without current pathological fracture: Secondary | ICD-10-CM

## 2018-07-29 DIAGNOSIS — R269 Unspecified abnormalities of gait and mobility: Secondary | ICD-10-CM

## 2018-07-29 DIAGNOSIS — Z23 Encounter for immunization: Secondary | ICD-10-CM

## 2018-07-29 DIAGNOSIS — I129 Hypertensive chronic kidney disease with stage 1 through stage 4 chronic kidney disease, or unspecified chronic kidney disease: Secondary | ICD-10-CM

## 2018-07-29 DIAGNOSIS — Z791 Long term (current) use of non-steroidal anti-inflammatories (NSAID): Secondary | ICD-10-CM

## 2018-07-29 DIAGNOSIS — Z7983 Long term (current) use of bisphosphonates: Secondary | ICD-10-CM

## 2018-07-29 DIAGNOSIS — G8929 Other chronic pain: Secondary | ICD-10-CM

## 2018-07-29 MED ORDER — NAPROXEN 500 MG PO TBEC: 500.0000 mg | DELAYED_RELEASE_TABLET | Freq: Two times a day (BID) | ORAL | 0 refills | Status: DC

## 2018-07-29 NOTE — Assessment & Plan Note (Signed)
This problem is chronic and stable.  She is on Maxide without any side effects.  Her repeat blood pressure today is at goal.  Her last BMP was in July 2019.  PLAN:  Cont current meds    BP Readings from Last 3 Encounters:  07/29/18 130/72  05/05/18 121/66  03/25/18 135/73

## 2018-07-29 NOTE — Assessment & Plan Note (Signed)
This problem is chronic and stable.  She is on Lipitor 10 mg a moderate intensity statin for primary prevention.  I have outlined the reasons for continuing this, along with her aspirin, and past notes.  PLAN:  Cont current meds

## 2018-07-29 NOTE — Progress Notes (Signed)
   Subjective:    Patient ID: Kathy Owens, female    DOB: 1956-02-15, 62 y.o.   MRN: [id number]  HPI  Kathy Owens is here for back pain. Please see the A&P for the status of the pt's chronic medical problems.  ROS : per ROS section and in problem oriented charting. All other systems are negative.  PMHx, Soc hx, and / or Fam hx : Kathy Owens has been younger [name] he was 22 years younger.  Days he is having issues with alcohol and keeping a job and a home.  Kathy Owens has realized that Kathy Owens can no longer financially afford to support her sister although Kathy Owens is having guilt with this decision.  Her father died at the age of 59 due to heart failure which Kathy Owens said was due to his smoking.  Kathy Owens is a lifelong non-smoker and nondrinker.  Review of Systems  Respiratory: Negative for shortness of breath.   Cardiovascular: Positive for leg swelling. Negative for chest pain.  Musculoskeletal: Positive for back pain and gait problem.      Objective:   Physical Exam  Constitutional: Kathy Owens appears well-developed and well-nourished. No distress.  HENT:  Head: Normocephalic and atraumatic.  Right Ear: External ear normal.  Left Ear: External ear normal.  Nose: Nose normal.  Eyes: Conjunctivae and EOM are normal.  Cardiovascular: Normal rate, regular rhythm and normal heart sounds.  No murmur heard. Pulmonary/Chest: Effort normal and breath sounds normal. No respiratory distress. Kathy Owens has no rales.  Musculoskeletal: Kathy Owens exhibits edema and deformity.  Neurological: Kathy Owens is alert.  Skin: Skin is warm and dry. No rash noted. Kathy Owens is not diaphoretic. No erythema. No pallor.  Psychiatric: Kathy Owens has a normal mood and affect. Her behavior is normal. Judgment and thought content normal.      Assessment & Plan:

## 2018-07-29 NOTE — Assessment & Plan Note (Signed)
She refuses Pap smears and refused a mammogram today.  She did get a flu shot today.  Her hearing issues have resolved  Her right arm aches with movements.  She can crack that joint and then the pain resolves.  We discussed that this is likely musculoskeletal and not cardiac related.  She was worried because when she went to the ER for hypertension, it was recommended that she get a stress test.  I do not think that she needs a stress test and told her so.

## 2018-07-29 NOTE — Assessment & Plan Note (Signed)
This was a first time discussing goals of care.  She knows the term living well but did not know what it was.  She is not comfortable electing a healthcare power of attorney because her sister is not appropriate due to her sister's alcohol use and her friends are not comfortable with this responsibility.  I did give her a living will so she could look at through and start to think about things.  We discussed her goals of care and it basically came down to her prognosis.  If the doctor felt she had a reversible etiology and could recover, she would want everything done.  If the physician felt like the underlying issues are not reversible and she would have to be on a vent or a feeding tube lifelong, she would not want invasive prolonged medical care.  PLAN :: We will continue to follow-up discussions and LivingWill completion

## 2018-07-29 NOTE — Assessment & Plan Note (Signed)
This problem is chronic and stable.  She remains on her H2 blocker without any symptoms.  She has no side effects to her H2 blocker.  PLAN:  Cont current meds

## 2018-07-29 NOTE — Assessment & Plan Note (Signed)
This problem is chronic and stable.  She is on alendronate weekly.  She forgot to take it this morning and I discussed that she could take it later today or wait and just resume next week.  He has no side effects to her alendronate.  She will likely need a bisphosphonate holiday about 2022.  PLAN:  Cont current meds

## 2018-07-29 NOTE — Assessment & Plan Note (Signed)
This problem is chronic and stable.  She has been getting very good relief to naproxen but I was worried about her chronic renal failure and encouraged her to cut back.  When she had to cut back, we had to resume hydrocodone which she is taking 3 times daily without any red flag behavior.  The first pill of the day does not make her sleepy.  The second pill makes her a little bit sleepy but does not affect her gait or stability.  The third toe helps her to sleep.  She wants to know if she can resume taking naproxen.  Her GFR is 50 and has overall been stable but had worsened a bit which led to the recommendation to back off of the nonsteroidal.  We discussed that it would be okay if he took naproxen about once a week and I will be following her creatinine about every 6 months.  She has enough hydrocodone to get her by until mid to late December.  PLAN:  Cont current meds

## 2018-07-29 NOTE — Patient Instructions (Addendum)
1. See me in 3 months 2. I am sorry you have having trouble with your sister 3. Take the naproxen once a week as needed

## 2018-07-29 NOTE — Assessment & Plan Note (Signed)
This problem is chronic and stable.  Her GFR is right around 50.  We will try her taking a nonsteroidal, naproxen, about once a week and follow her creatinine about every 6 months.  It has overall been stable for years.  PLAN: Naproxen weekly as needed Creatinine at her next appointment in January

## 2018-09-14 ENCOUNTER — Encounter: Payer: Self-pay | Admitting: Podiatry

## 2018-09-14 ENCOUNTER — Ambulatory Visit (INDEPENDENT_AMBULATORY_CARE_PROVIDER_SITE_OTHER): Payer: Medicare Other | Admitting: Podiatry

## 2018-09-14 DIAGNOSIS — B351 Tinea unguium: Secondary | ICD-10-CM | POA: Diagnosis not present

## 2018-09-14 DIAGNOSIS — M79674 Pain in right toe(s): Secondary | ICD-10-CM

## 2018-09-14 DIAGNOSIS — M79675 Pain in left toe(s): Secondary | ICD-10-CM

## 2018-09-14 DIAGNOSIS — G808 Other cerebral palsy: Secondary | ICD-10-CM | POA: Diagnosis not present

## 2018-09-14 NOTE — Progress Notes (Signed)
Complaint:  Visit Type: Patient returns to my office for continued preventative foot care services. Complaint: Patient states" my nails have grown long and thick and become painful to walk and wear shoes" Patient has been diagnosed with CP. The patient presents for preventative foot care services. No changes to ROS.  Patient has cerebral palsy.  Podiatric Exam: Vascular: dorsalis pedis and posterior tibial pulses are palpable bilateral. Capillary return is immediate. Temperature gradient is WNL. Skin turgor WNL  Sensorium: Normal Semmes Weinstein monofilament test. Normal tactile sensation bilaterally. Nail Exam: Pt has thick disfigured discolored nails with subungual debris noted bilateral entire nail hallux through fifth toenails Ulcer Exam: There is no evidence of ulcer or pre-ulcerative changes or infection. Orthopedic Exam: Muscle tone and strength are WNL. No limitations in general ROM. No crepitus or effusions noted. Flattened foot profile with prominent navicular tuberosity. STJ minimal right foot.  Severe HAV  B/L  Hammer toes 5th  B/L Skin: No Porokeratosis. No infection or ulcers  Diagnosis:  Onychomycosis, , Pain in right toe, pain in left toes  Treatment & Plan Procedures and Treatment: Consent by patient was obtained for treatment procedures.   Debridement of mycotic and hypertrophic toenails, 1 through 5 bilateral and clearing of subungual debris. No ulceration, no infection noted.  Return Visit-Office Procedure: Patient instructed to return to the office for a follow up visit 3 months for continued evaluation and treatment.    Gardiner Barefoot DPM

## 2018-09-20 ENCOUNTER — Encounter (HOSPITAL_COMMUNITY): Payer: Self-pay | Admitting: Emergency Medicine

## 2018-09-20 ENCOUNTER — Emergency Department (HOSPITAL_BASED_OUTPATIENT_CLINIC_OR_DEPARTMENT_OTHER): Payer: Medicare Other

## 2018-09-20 ENCOUNTER — Emergency Department (HOSPITAL_COMMUNITY)
Admission: EM | Admit: 2018-09-20 | Discharge: 2018-09-20 | Disposition: A | Discharge status: Home / Self Care | Payer: Medicare Other | Attending: Emergency Medicine | Admitting: Emergency Medicine

## 2018-09-20 ENCOUNTER — Other Ambulatory Visit: Payer: Self-pay

## 2018-09-20 DIAGNOSIS — N183 Chronic kidney disease, stage 3 (moderate): Secondary | ICD-10-CM | POA: Diagnosis not present

## 2018-09-20 DIAGNOSIS — Z7982 Long term (current) use of aspirin: Secondary | ICD-10-CM | POA: Diagnosis not present

## 2018-09-20 DIAGNOSIS — T07XXXA Unspecified multiple injuries, initial encounter: Secondary | ICD-10-CM

## 2018-09-20 DIAGNOSIS — S80812A Abrasion, left lower leg, initial encounter: Secondary | ICD-10-CM | POA: Diagnosis not present

## 2018-09-20 DIAGNOSIS — M7989 Other specified soft tissue disorders: Secondary | ICD-10-CM | POA: Diagnosis not present

## 2018-09-20 DIAGNOSIS — Z79899 Other long term (current) drug therapy: Secondary | ICD-10-CM | POA: Diagnosis not present

## 2018-09-20 DIAGNOSIS — S80811A Abrasion, right lower leg, initial encounter: Secondary | ICD-10-CM | POA: Diagnosis not present

## 2018-09-20 DIAGNOSIS — I129 Hypertensive chronic kidney disease with stage 1 through stage 4 chronic kidney disease, or unspecified chronic kidney disease: Secondary | ICD-10-CM | POA: Insufficient documentation

## 2018-09-20 DIAGNOSIS — R2242 Localized swelling, mass and lump, left lower limb: Secondary | ICD-10-CM | POA: Diagnosis present

## 2018-09-20 DIAGNOSIS — X58XXXA Exposure to other specified factors, initial encounter: Secondary | ICD-10-CM | POA: Insufficient documentation

## 2018-09-20 DIAGNOSIS — L03116 Cellulitis of left lower limb: Secondary | ICD-10-CM | POA: Insufficient documentation

## 2018-09-20 DIAGNOSIS — Y929 Unspecified place or not applicable: Secondary | ICD-10-CM | POA: Diagnosis not present

## 2018-09-20 DIAGNOSIS — Y939 Activity, unspecified: Secondary | ICD-10-CM | POA: Diagnosis not present

## 2018-09-20 DIAGNOSIS — Y999 Unspecified external cause status: Secondary | ICD-10-CM | POA: Diagnosis not present

## 2018-09-20 HISTORY — DX: Disorder of kidney and ureter, unspecified: N28.9

## 2018-09-20 LAB — CBC WITH DIFFERENTIAL/PLATELET
Abs Immature Granulocytes: 0.01 10*3/uL (ref 0.00–0.07)
Basophils Absolute: 0 10*3/uL (ref 0.0–0.1)
Basophils Relative: 0 %
EOS PCT: 3 %
Eosinophils Absolute: 0.2 10*3/uL (ref 0.0–0.5)
HEMATOCRIT: 39.8 % (ref 36.0–46.0)
Hemoglobin: 12.4 g/dL (ref 12.0–15.0)
Immature Granulocytes: 0 %
Lymphocytes Relative: 24 %
Lymphs Abs: 1.2 10*3/uL (ref 0.7–4.0)
MCH: 27.7 pg (ref 26.0–34.0)
MCHC: 31.2 g/dL (ref 30.0–36.0)
MCV: 88.8 fL (ref 80.0–100.0)
MONOS PCT: 7 %
Monocytes Absolute: 0.4 10*3/uL (ref 0.1–1.0)
Neutro Abs: 3.3 10*3/uL (ref 1.7–7.7)
Neutrophils Relative %: 66 %
Platelets: 223 10*3/uL (ref 150–400)
RBC: 4.48 MIL/uL (ref 3.87–5.11)
RDW: 13.4 % (ref 11.5–15.5)
WBC: 5.1 10*3/uL (ref 4.0–10.5)
nRBC: 0 % (ref 0.0–0.2)

## 2018-09-20 LAB — BASIC METABOLIC PANEL
Anion gap: 9 (ref 5–15)
BUN: 25 mg/dL — AB (ref 8–23)
CO2: 24 mmol/L (ref 22–32)
Calcium: 9.4 mg/dL (ref 8.9–10.3)
Chloride: 105 mmol/L (ref 98–111)
Creatinine, Ser: 1.12 mg/dL — ABNORMAL HIGH (ref 0.44–1.00)
GFR calc Af Amer: >60 mL/min (ref >60)
GFR calc non Af Amer: 52 mL/min — ABNORMAL LOW (ref >60)
Glucose, Bld: 99 mg/dL (ref 70–99)
Potassium: 3.3 mmol/L — ABNORMAL LOW (ref 3.5–5.1)
Sodium: 138 mmol/L (ref 135–145)

## 2018-09-20 LAB — LACTIC ACID, PLASMA
Lactic Acid, Venous: 0.9 mmol/L (ref 0.5–1.9)
Lactic Acid, Venous: 1.1 mmol/L (ref 0.5–1.9)

## 2018-09-20 MED ORDER — CLINDAMYCIN HCL 150 MG PO CAPS: ORAL_CAPSULE | ORAL | 0 refills | Status: DC

## 2018-09-20 MED ORDER — CLINDAMYCIN HCL 150 MG PO CAPS
450.0000 mg | ORAL_CAPSULE | Freq: Once | ORAL | Status: AC
  Administered 2018-09-20: 450 mg via ORAL
  Filled 2018-09-20: qty 3

## 2018-09-20 MED ORDER — POTASSIUM CHLORIDE CRYS ER 20 MEQ PO TBCR
20.0000 meq | EXTENDED_RELEASE_TABLET | Freq: Once | ORAL | Status: AC
  Administered 2018-09-20: 20 meq via ORAL
  Filled 2018-09-20: qty 1
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
## 2018-09-20 NOTE — Discharge Planning (Signed)
Leeanne Butters J. Nyquan Selbe, RN, BSN, NCM [phone] Spoke with pt at bedside regarding discharge planning for Home Health Services. Offered pt list of home health agencies to choose from.  Pt chose [facility] to render services. Donna Fellmy of [facility] notified. Patient made aware that [facility] will be in contact in 24-48 hours.  No DME needs identified at this time.  

## 2018-09-20 NOTE — ED Notes (Signed)
Helped get patient undress on the monitor patient is resting with call bell in reach

## 2018-09-20 NOTE — ED Triage Notes (Signed)
Pt arrives via POV from home with left lower leg redness. Reports constant swelling and normally wears TED hose. Pt alert, awake, appropriate, VSS.

## 2018-09-20 NOTE — Discharge Instructions (Signed)
Take the prescription as directed.  Wash the abraded areas on your legs gently with soap and water, and pat dry, at least twice a day, and cover with a clean/dry dressing.  Change the dressing whenever it becomes wet or soiled after washing the area with soap and water and patting dry.  Call your regular medical doctor today to schedule a follow up appointment within the next 2 days.  Return to the Emergency Department immediately if worsening.

## 2018-09-20 NOTE — Progress Notes (Signed)
*  Preliminary Results* Left lower extremity venous duplex completed. There is no obvious evidence of deep vein thrombosis involving the visualized veins of the left lower extremity. There is no evidence of left Baker's cyst.  09/20/2018 11:20 AM  Maudry Mayhew, MHA, RVT, RDCS, RDMS

## 2018-09-21 ENCOUNTER — Encounter: Payer: Self-pay | Admitting: *Deleted

## 2018-09-21 NOTE — Progress Notes (Signed)
Faxing note to [facility].

## 2018-09-23 ENCOUNTER — Telehealth: Payer: Self-pay | Admitting: Internal Medicine

## 2018-09-23 NOTE — Telephone Encounter (Signed)
Called Jocelyn Lamer V - no answer; left message to call the office.

## 2018-09-23 NOTE — Telephone Encounter (Signed)
Kathy Owens is the service coordinator for the apartment, she is wanted to give information regarding patient, [phone]

## 2018-09-25 ENCOUNTER — Emergency Department (HOSPITAL_COMMUNITY)
Admission: EM | Admit: 2018-09-25 | Discharge: 2018-09-25 | Disposition: A | Discharge status: Home / Self Care | Payer: Medicare Other | Attending: Emergency Medicine | Admitting: Emergency Medicine

## 2018-09-25 DIAGNOSIS — N183 Chronic kidney disease, stage 3 (moderate): Secondary | ICD-10-CM | POA: Insufficient documentation

## 2018-09-25 DIAGNOSIS — R600 Localized edema: Secondary | ICD-10-CM

## 2018-09-25 DIAGNOSIS — R404 Transient alteration of awareness: Secondary | ICD-10-CM | POA: Diagnosis not present

## 2018-09-25 DIAGNOSIS — I129 Hypertensive chronic kidney disease with stage 1 through stage 4 chronic kidney disease, or unspecified chronic kidney disease: Secondary | ICD-10-CM | POA: Diagnosis not present

## 2018-09-25 DIAGNOSIS — R5381 Other malaise: Secondary | ICD-10-CM | POA: Diagnosis not present

## 2018-09-25 DIAGNOSIS — Z7982 Long term (current) use of aspirin: Secondary | ICD-10-CM | POA: Insufficient documentation

## 2018-09-25 DIAGNOSIS — Z79899 Other long term (current) drug therapy: Secondary | ICD-10-CM | POA: Diagnosis not present

## 2018-09-25 DIAGNOSIS — L0390 Cellulitis, unspecified: Secondary | ICD-10-CM | POA: Diagnosis not present

## 2018-09-25 DIAGNOSIS — R609 Edema, unspecified: Secondary | ICD-10-CM | POA: Diagnosis not present

## 2018-09-25 DIAGNOSIS — Z743 Need for continuous supervision: Secondary | ICD-10-CM | POA: Diagnosis not present

## 2018-09-25 DIAGNOSIS — R279 Unspecified lack of coordination: Secondary | ICD-10-CM | POA: Diagnosis not present

## 2018-09-25 LAB — CBC WITH DIFFERENTIAL/PLATELET
Abs Immature Granulocytes: 0.02 10*3/uL (ref 0.00–0.07)
BASOS ABS: 0 10*3/uL (ref 0.0–0.1)
Basophils Relative: 1 %
Eosinophils Absolute: 0.2 10*3/uL (ref 0.0–0.5)
Eosinophils Relative: 3 %
HCT: 39.8 % (ref 36.0–46.0)
Hemoglobin: 12.8 g/dL (ref 12.0–15.0)
Immature Granulocytes: 0 %
Lymphocytes Relative: 19 %
Lymphs Abs: 1 10*3/uL (ref 0.7–4.0)
MCH: 28.8 pg (ref 26.0–34.0)
MCHC: 32.2 g/dL (ref 30.0–36.0)
MCV: 89.6 fL (ref 80.0–100.0)
Monocytes Absolute: 0.4 10*3/uL (ref 0.1–1.0)
Monocytes Relative: 7 %
NEUTROS ABS: 3.7 10*3/uL (ref 1.7–7.7)
NRBC: 0 % (ref 0.0–0.2)
Neutrophils Relative %: 70 %
PLATELETS: 212 10*3/uL (ref 150–400)
RBC: 4.44 MIL/uL (ref 3.87–5.11)
RDW: 13.4 % (ref 11.5–15.5)
WBC: 5.2 10*3/uL (ref 4.0–10.5)

## 2018-09-25 LAB — BASIC METABOLIC PANEL
Anion gap: 14 (ref 5–15)
BUN: 26 mg/dL — ABNORMAL HIGH (ref 8–23)
CO2: 23 mmol/L (ref 22–32)
Calcium: 9.3 mg/dL (ref 8.9–10.3)
Chloride: 103 mmol/L (ref 98–111)
Creatinine, Ser: 1.15 mg/dL — ABNORMAL HIGH (ref 0.44–1.00)
GFR calc Af Amer: 59 mL/min — ABNORMAL LOW (ref >60)
GFR calc non Af Amer: 51 mL/min — ABNORMAL LOW (ref >60)
Glucose, Bld: 99 mg/dL (ref 70–99)
Potassium: 4 mmol/L (ref 3.5–5.1)
SODIUM: 140 mmol/L (ref 135–145)

## 2018-09-25 NOTE — ED Notes (Signed)
Pt verbalized understanding to continue taking antibiotics and diuretics and to follow up with PCP this week. VSS, NAD. Pt going home via ptar.

## 2018-09-25 NOTE — ED Triage Notes (Addendum)
Pt arrived via [facility]; pt from home with L leg Cellulitis; pt c/o RLE aggravation and was instructed to return if it got worse. 152/90, 70, 97% on RA

## 2018-09-25 NOTE — Discharge Instructions (Addendum)
Please continue taking the antibiotic you were prescribed for your cellulitis.  Please continue taking your diuretic at home.  Please wear your TED hose and keep your legs elevated when sitting.  Follow up with your doctor in 1 week for re-evaluation. Please return to the emergency room immediately if you experience any new or worsening symptoms or any symptoms that indicate worsening infection such as fevers, increased redness/swelling/pain, warmth, or drainage from the affected area.

## 2018-09-25 NOTE — ED Notes (Signed)
Called ptar for pt transport to [facility]
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
## 2018-09-27 ENCOUNTER — Telehealth: Payer: Self-pay

## 2018-09-27 DIAGNOSIS — Z96641 Presence of right artificial hip joint: Secondary | ICD-10-CM | POA: Diagnosis not present

## 2018-09-27 DIAGNOSIS — I872 Venous insufficiency (chronic) (peripheral): Secondary | ICD-10-CM | POA: Diagnosis not present

## 2018-09-27 DIAGNOSIS — M7541 Impingement syndrome of right shoulder: Secondary | ICD-10-CM | POA: Diagnosis not present

## 2018-09-27 DIAGNOSIS — K219 Gastro-esophageal reflux disease without esophagitis: Secondary | ICD-10-CM | POA: Diagnosis not present

## 2018-09-27 DIAGNOSIS — Z792 Long term (current) use of antibiotics: Secondary | ICD-10-CM | POA: Diagnosis not present

## 2018-09-27 DIAGNOSIS — R6884 Jaw pain: Secondary | ICD-10-CM | POA: Diagnosis not present

## 2018-09-27 DIAGNOSIS — G801 Spastic diplegic cerebral palsy: Secondary | ICD-10-CM | POA: Diagnosis not present

## 2018-09-27 DIAGNOSIS — H47013 Ischemic optic neuropathy, bilateral: Secondary | ICD-10-CM | POA: Diagnosis not present

## 2018-09-27 DIAGNOSIS — S80812D Abrasion, left lower leg, subsequent encounter: Secondary | ICD-10-CM | POA: Diagnosis not present

## 2018-09-27 DIAGNOSIS — E785 Hyperlipidemia, unspecified: Secondary | ICD-10-CM | POA: Diagnosis not present

## 2018-09-27 DIAGNOSIS — Z7982 Long term (current) use of aspirin: Secondary | ICD-10-CM | POA: Diagnosis not present

## 2018-09-27 DIAGNOSIS — N183 Chronic kidney disease, stage 3 (moderate): Secondary | ICD-10-CM | POA: Diagnosis not present

## 2018-09-27 DIAGNOSIS — L03116 Cellulitis of left lower limb: Secondary | ICD-10-CM | POA: Diagnosis not present

## 2018-09-27 DIAGNOSIS — S80811D Abrasion, right lower leg, subsequent encounter: Secondary | ICD-10-CM | POA: Diagnosis not present

## 2018-09-27 DIAGNOSIS — I129 Hypertensive chronic kidney disease with stage 1 through stage 4 chronic kidney disease, or unspecified chronic kidney disease: Secondary | ICD-10-CM | POA: Diagnosis not present

## 2018-09-27 DIAGNOSIS — G8929 Other chronic pain: Secondary | ICD-10-CM | POA: Diagnosis not present

## 2018-09-27 DIAGNOSIS — M810 Age-related osteoporosis without current pathological fracture: Secondary | ICD-10-CM | POA: Diagnosis not present

## 2018-09-27 DIAGNOSIS — R42 Dizziness and giddiness: Secondary | ICD-10-CM | POA: Diagnosis not present

## 2018-09-27 NOTE — Telephone Encounter (Signed)
Received TC from [place], [facility] nurse.  She is requesting [facility] nursing visits 2X/week for 3 weeks , DX lower leg cellulitis and is on clindamycin.  [facility] also asking for VO for:  1.  skin protectant cream (secura) since pt on ABX 2.  BSC (pt's BSC she obtained at yard sale is broken) 3.  TED hose   VO given, informed Taylor will route message to pcp for agreement/denial. Thanks, SChaplin, RN,BSN

## 2018-09-28 DIAGNOSIS — I129 Hypertensive chronic kidney disease with stage 1 through stage 4 chronic kidney disease, or unspecified chronic kidney disease: Secondary | ICD-10-CM | POA: Diagnosis not present

## 2018-09-28 DIAGNOSIS — I872 Venous insufficiency (chronic) (peripheral): Secondary | ICD-10-CM | POA: Diagnosis not present

## 2018-09-28 DIAGNOSIS — M7541 Impingement syndrome of right shoulder: Secondary | ICD-10-CM | POA: Diagnosis not present

## 2018-09-28 DIAGNOSIS — H47013 Ischemic optic neuropathy, bilateral: Secondary | ICD-10-CM | POA: Diagnosis not present

## 2018-09-28 DIAGNOSIS — M810 Age-related osteoporosis without current pathological fracture: Secondary | ICD-10-CM | POA: Diagnosis not present

## 2018-09-28 DIAGNOSIS — K219 Gastro-esophageal reflux disease without esophagitis: Secondary | ICD-10-CM | POA: Diagnosis not present

## 2018-09-28 DIAGNOSIS — E785 Hyperlipidemia, unspecified: Secondary | ICD-10-CM | POA: Diagnosis not present

## 2018-09-28 DIAGNOSIS — Z792 Long term (current) use of antibiotics: Secondary | ICD-10-CM | POA: Diagnosis not present

## 2018-09-28 DIAGNOSIS — R42 Dizziness and giddiness: Secondary | ICD-10-CM | POA: Diagnosis not present

## 2018-09-28 DIAGNOSIS — Z7982 Long term (current) use of aspirin: Secondary | ICD-10-CM | POA: Diagnosis not present

## 2018-09-28 DIAGNOSIS — G801 Spastic diplegic cerebral palsy: Secondary | ICD-10-CM | POA: Diagnosis not present

## 2018-09-28 DIAGNOSIS — R6884 Jaw pain: Secondary | ICD-10-CM | POA: Diagnosis not present

## 2018-09-28 DIAGNOSIS — L03116 Cellulitis of left lower limb: Secondary | ICD-10-CM | POA: Diagnosis not present

## 2018-09-28 DIAGNOSIS — G8929 Other chronic pain: Secondary | ICD-10-CM | POA: Diagnosis not present

## 2018-09-28 DIAGNOSIS — S80811D Abrasion, right lower leg, subsequent encounter: Secondary | ICD-10-CM | POA: Diagnosis not present

## 2018-09-28 DIAGNOSIS — Z96641 Presence of right artificial hip joint: Secondary | ICD-10-CM | POA: Diagnosis not present

## 2018-09-28 DIAGNOSIS — S80812D Abrasion, left lower leg, subsequent encounter: Secondary | ICD-10-CM | POA: Diagnosis not present

## 2018-09-28 DIAGNOSIS — N183 Chronic kidney disease, stage 3 (moderate): Secondary | ICD-10-CM | POA: Diagnosis not present

## 2018-09-28 NOTE — Telephone Encounter (Signed)
Agree. thanks

## 2018-09-28 NOTE — Telephone Encounter (Signed)
rtc to Kathy Owens, she states pt seems to be having a harder time moving about, states it took pt 3mins to get in the car last week, she has noticed that pt has more difficulty with activity in general and spoke w/ pt about PT. Pt agreed for Kathy Owens to call and advocate for her to have PT but pt also has transportation issues, possibly HH PT for eval and treat?

## 2018-09-29 ENCOUNTER — Telehealth: Payer: Self-pay

## 2018-09-29 NOTE — Telephone Encounter (Signed)
Agree with HHPT Thanks

## 2018-09-29 NOTE — Telephone Encounter (Signed)
rtc to [facility], VO for csw, PT, OT eval and treat. Do you agree?

## 2018-09-29 NOTE — Telephone Encounter (Signed)
yes

## 2018-09-29 NOTE — Telephone Encounter (Signed)
Kathy Owens with [facility] requesting VO for PT and OT. Please call back.

## 2018-09-30 DIAGNOSIS — G8929 Other chronic pain: Secondary | ICD-10-CM | POA: Diagnosis not present

## 2018-09-30 DIAGNOSIS — Z96641 Presence of right artificial hip joint: Secondary | ICD-10-CM | POA: Diagnosis not present

## 2018-09-30 DIAGNOSIS — R6884 Jaw pain: Secondary | ICD-10-CM | POA: Diagnosis not present

## 2018-09-30 DIAGNOSIS — M810 Age-related osteoporosis without current pathological fracture: Secondary | ICD-10-CM | POA: Diagnosis not present

## 2018-09-30 DIAGNOSIS — K219 Gastro-esophageal reflux disease without esophagitis: Secondary | ICD-10-CM | POA: Diagnosis not present

## 2018-09-30 DIAGNOSIS — E785 Hyperlipidemia, unspecified: Secondary | ICD-10-CM | POA: Diagnosis not present

## 2018-09-30 DIAGNOSIS — I129 Hypertensive chronic kidney disease with stage 1 through stage 4 chronic kidney disease, or unspecified chronic kidney disease: Secondary | ICD-10-CM | POA: Diagnosis not present

## 2018-09-30 DIAGNOSIS — Z7982 Long term (current) use of aspirin: Secondary | ICD-10-CM | POA: Diagnosis not present

## 2018-09-30 DIAGNOSIS — R42 Dizziness and giddiness: Secondary | ICD-10-CM | POA: Diagnosis not present

## 2018-09-30 DIAGNOSIS — N183 Chronic kidney disease, stage 3 (moderate): Secondary | ICD-10-CM | POA: Diagnosis not present

## 2018-09-30 DIAGNOSIS — H47013 Ischemic optic neuropathy, bilateral: Secondary | ICD-10-CM | POA: Diagnosis not present

## 2018-09-30 DIAGNOSIS — M7541 Impingement syndrome of right shoulder: Secondary | ICD-10-CM | POA: Diagnosis not present

## 2018-09-30 DIAGNOSIS — I872 Venous insufficiency (chronic) (peripheral): Secondary | ICD-10-CM | POA: Diagnosis not present

## 2018-09-30 DIAGNOSIS — L03116 Cellulitis of left lower limb: Secondary | ICD-10-CM | POA: Diagnosis not present

## 2018-09-30 DIAGNOSIS — S80811D Abrasion, right lower leg, subsequent encounter: Secondary | ICD-10-CM | POA: Diagnosis not present

## 2018-09-30 DIAGNOSIS — G801 Spastic diplegic cerebral palsy: Secondary | ICD-10-CM | POA: Diagnosis not present

## 2018-09-30 DIAGNOSIS — Z792 Long term (current) use of antibiotics: Secondary | ICD-10-CM | POA: Diagnosis not present

## 2018-09-30 DIAGNOSIS — S80812D Abrasion, left lower leg, subsequent encounter: Secondary | ICD-10-CM | POA: Diagnosis not present

## 2018-10-01 DIAGNOSIS — I872 Venous insufficiency (chronic) (peripheral): Secondary | ICD-10-CM | POA: Diagnosis not present

## 2018-10-01 DIAGNOSIS — E785 Hyperlipidemia, unspecified: Secondary | ICD-10-CM | POA: Diagnosis not present

## 2018-10-01 DIAGNOSIS — S80811D Abrasion, right lower leg, subsequent encounter: Secondary | ICD-10-CM | POA: Diagnosis not present

## 2018-10-01 DIAGNOSIS — N183 Chronic kidney disease, stage 3 (moderate): Secondary | ICD-10-CM | POA: Diagnosis not present

## 2018-10-01 DIAGNOSIS — G801 Spastic diplegic cerebral palsy: Secondary | ICD-10-CM | POA: Diagnosis not present

## 2018-10-01 DIAGNOSIS — R42 Dizziness and giddiness: Secondary | ICD-10-CM | POA: Diagnosis not present

## 2018-10-01 DIAGNOSIS — G8929 Other chronic pain: Secondary | ICD-10-CM | POA: Diagnosis not present

## 2018-10-01 DIAGNOSIS — Z96641 Presence of right artificial hip joint: Secondary | ICD-10-CM | POA: Diagnosis not present

## 2018-10-01 DIAGNOSIS — K219 Gastro-esophageal reflux disease without esophagitis: Secondary | ICD-10-CM | POA: Diagnosis not present

## 2018-10-01 DIAGNOSIS — L03116 Cellulitis of left lower limb: Secondary | ICD-10-CM | POA: Diagnosis not present

## 2018-10-01 DIAGNOSIS — S80812D Abrasion, left lower leg, subsequent encounter: Secondary | ICD-10-CM | POA: Diagnosis not present

## 2018-10-01 DIAGNOSIS — Z792 Long term (current) use of antibiotics: Secondary | ICD-10-CM | POA: Diagnosis not present

## 2018-10-01 DIAGNOSIS — M810 Age-related osteoporosis without current pathological fracture: Secondary | ICD-10-CM | POA: Diagnosis not present

## 2018-10-01 DIAGNOSIS — I129 Hypertensive chronic kidney disease with stage 1 through stage 4 chronic kidney disease, or unspecified chronic kidney disease: Secondary | ICD-10-CM | POA: Diagnosis not present

## 2018-10-01 DIAGNOSIS — Z7982 Long term (current) use of aspirin: Secondary | ICD-10-CM | POA: Diagnosis not present

## 2018-10-01 DIAGNOSIS — M7541 Impingement syndrome of right shoulder: Secondary | ICD-10-CM | POA: Diagnosis not present

## 2018-10-01 DIAGNOSIS — R6884 Jaw pain: Secondary | ICD-10-CM | POA: Diagnosis not present

## 2018-10-01 DIAGNOSIS — H47013 Ischemic optic neuropathy, bilateral: Secondary | ICD-10-CM | POA: Diagnosis not present

## 2018-10-04 ENCOUNTER — Other Ambulatory Visit: Payer: Self-pay | Admitting: Internal Medicine

## 2018-10-04 DIAGNOSIS — Z7982 Long term (current) use of aspirin: Secondary | ICD-10-CM | POA: Diagnosis not present

## 2018-10-04 DIAGNOSIS — K219 Gastro-esophageal reflux disease without esophagitis: Secondary | ICD-10-CM | POA: Diagnosis not present

## 2018-10-04 DIAGNOSIS — M810 Age-related osteoporosis without current pathological fracture: Secondary | ICD-10-CM | POA: Diagnosis not present

## 2018-10-04 DIAGNOSIS — M7541 Impingement syndrome of right shoulder: Secondary | ICD-10-CM | POA: Diagnosis not present

## 2018-10-04 DIAGNOSIS — E785 Hyperlipidemia, unspecified: Secondary | ICD-10-CM | POA: Diagnosis not present

## 2018-10-04 DIAGNOSIS — N183 Chronic kidney disease, stage 3 (moderate): Secondary | ICD-10-CM | POA: Diagnosis not present

## 2018-10-04 DIAGNOSIS — H47013 Ischemic optic neuropathy, bilateral: Secondary | ICD-10-CM | POA: Diagnosis not present

## 2018-10-04 DIAGNOSIS — I872 Venous insufficiency (chronic) (peripheral): Secondary | ICD-10-CM | POA: Diagnosis not present

## 2018-10-04 DIAGNOSIS — S80811D Abrasion, right lower leg, subsequent encounter: Secondary | ICD-10-CM | POA: Diagnosis not present

## 2018-10-04 DIAGNOSIS — L03116 Cellulitis of left lower limb: Secondary | ICD-10-CM | POA: Diagnosis not present

## 2018-10-04 DIAGNOSIS — Z96641 Presence of right artificial hip joint: Secondary | ICD-10-CM | POA: Diagnosis not present

## 2018-10-04 DIAGNOSIS — G8929 Other chronic pain: Secondary | ICD-10-CM | POA: Diagnosis not present

## 2018-10-04 DIAGNOSIS — S80812D Abrasion, left lower leg, subsequent encounter: Secondary | ICD-10-CM | POA: Diagnosis not present

## 2018-10-04 DIAGNOSIS — G801 Spastic diplegic cerebral palsy: Secondary | ICD-10-CM | POA: Diagnosis not present

## 2018-10-04 DIAGNOSIS — Z792 Long term (current) use of antibiotics: Secondary | ICD-10-CM | POA: Diagnosis not present

## 2018-10-04 DIAGNOSIS — R6884 Jaw pain: Secondary | ICD-10-CM | POA: Diagnosis not present

## 2018-10-04 DIAGNOSIS — I129 Hypertensive chronic kidney disease with stage 1 through stage 4 chronic kidney disease, or unspecified chronic kidney disease: Secondary | ICD-10-CM | POA: Diagnosis not present

## 2018-10-04 DIAGNOSIS — R42 Dizziness and giddiness: Secondary | ICD-10-CM | POA: Diagnosis not present

## 2018-10-04 MED ORDER — HYDROCODONE-ACETAMINOPHEN 5-325 MG PO TABS: 1.0000 | ORAL_TABLET | Freq: Four times a day (QID) | ORAL | 0 refills | Status: DC | PRN

## 2018-10-04 NOTE — Progress Notes (Signed)
I refilled her hydrocodone

## 2018-10-05 ENCOUNTER — Other Ambulatory Visit: Payer: Self-pay | Admitting: Internal Medicine

## 2018-10-05 DIAGNOSIS — M8000XA Age-related osteoporosis with current pathological fracture, unspecified site, initial encounter for fracture: Secondary | ICD-10-CM

## 2018-10-05 DIAGNOSIS — M810 Age-related osteoporosis without current pathological fracture: Secondary | ICD-10-CM | POA: Diagnosis not present

## 2018-10-05 DIAGNOSIS — Z96641 Presence of right artificial hip joint: Secondary | ICD-10-CM | POA: Diagnosis not present

## 2018-10-05 DIAGNOSIS — I872 Venous insufficiency (chronic) (peripheral): Secondary | ICD-10-CM | POA: Diagnosis not present

## 2018-10-05 DIAGNOSIS — M7541 Impingement syndrome of right shoulder: Secondary | ICD-10-CM | POA: Diagnosis not present

## 2018-10-05 DIAGNOSIS — R6884 Jaw pain: Secondary | ICD-10-CM | POA: Diagnosis not present

## 2018-10-05 DIAGNOSIS — K219 Gastro-esophageal reflux disease without esophagitis: Secondary | ICD-10-CM | POA: Diagnosis not present

## 2018-10-05 DIAGNOSIS — L03116 Cellulitis of left lower limb: Secondary | ICD-10-CM | POA: Diagnosis not present

## 2018-10-05 DIAGNOSIS — N183 Chronic kidney disease, stage 3 (moderate): Secondary | ICD-10-CM | POA: Diagnosis not present

## 2018-10-05 DIAGNOSIS — S80811D Abrasion, right lower leg, subsequent encounter: Secondary | ICD-10-CM | POA: Diagnosis not present

## 2018-10-05 DIAGNOSIS — G801 Spastic diplegic cerebral palsy: Secondary | ICD-10-CM | POA: Diagnosis not present

## 2018-10-05 DIAGNOSIS — I129 Hypertensive chronic kidney disease with stage 1 through stage 4 chronic kidney disease, or unspecified chronic kidney disease: Secondary | ICD-10-CM | POA: Diagnosis not present

## 2018-10-05 DIAGNOSIS — G8929 Other chronic pain: Secondary | ICD-10-CM | POA: Diagnosis not present

## 2018-10-05 DIAGNOSIS — H47013 Ischemic optic neuropathy, bilateral: Secondary | ICD-10-CM | POA: Diagnosis not present

## 2018-10-05 DIAGNOSIS — Z792 Long term (current) use of antibiotics: Secondary | ICD-10-CM | POA: Diagnosis not present

## 2018-10-05 DIAGNOSIS — R42 Dizziness and giddiness: Secondary | ICD-10-CM | POA: Diagnosis not present

## 2018-10-05 DIAGNOSIS — Z7982 Long term (current) use of aspirin: Secondary | ICD-10-CM | POA: Diagnosis not present

## 2018-10-05 DIAGNOSIS — S80812D Abrasion, left lower leg, subsequent encounter: Secondary | ICD-10-CM | POA: Diagnosis not present

## 2018-10-05 DIAGNOSIS — E785 Hyperlipidemia, unspecified: Secondary | ICD-10-CM | POA: Diagnosis not present

## 2018-10-06 DIAGNOSIS — R6884 Jaw pain: Secondary | ICD-10-CM | POA: Diagnosis not present

## 2018-10-06 DIAGNOSIS — G801 Spastic diplegic cerebral palsy: Secondary | ICD-10-CM | POA: Diagnosis not present

## 2018-10-06 DIAGNOSIS — Z792 Long term (current) use of antibiotics: Secondary | ICD-10-CM | POA: Diagnosis not present

## 2018-10-06 DIAGNOSIS — Z7982 Long term (current) use of aspirin: Secondary | ICD-10-CM | POA: Diagnosis not present

## 2018-10-06 DIAGNOSIS — S80811D Abrasion, right lower leg, subsequent encounter: Secondary | ICD-10-CM | POA: Diagnosis not present

## 2018-10-06 DIAGNOSIS — K219 Gastro-esophageal reflux disease without esophagitis: Secondary | ICD-10-CM | POA: Diagnosis not present

## 2018-10-06 DIAGNOSIS — Z96641 Presence of right artificial hip joint: Secondary | ICD-10-CM | POA: Diagnosis not present

## 2018-10-06 DIAGNOSIS — H47013 Ischemic optic neuropathy, bilateral: Secondary | ICD-10-CM | POA: Diagnosis not present

## 2018-10-06 DIAGNOSIS — N183 Chronic kidney disease, stage 3 (moderate): Secondary | ICD-10-CM | POA: Diagnosis not present

## 2018-10-06 DIAGNOSIS — I872 Venous insufficiency (chronic) (peripheral): Secondary | ICD-10-CM | POA: Diagnosis not present

## 2018-10-06 DIAGNOSIS — E785 Hyperlipidemia, unspecified: Secondary | ICD-10-CM | POA: Diagnosis not present

## 2018-10-06 DIAGNOSIS — L03116 Cellulitis of left lower limb: Secondary | ICD-10-CM | POA: Diagnosis not present

## 2018-10-06 DIAGNOSIS — G8929 Other chronic pain: Secondary | ICD-10-CM | POA: Diagnosis not present

## 2018-10-06 DIAGNOSIS — I129 Hypertensive chronic kidney disease with stage 1 through stage 4 chronic kidney disease, or unspecified chronic kidney disease: Secondary | ICD-10-CM | POA: Diagnosis not present

## 2018-10-06 DIAGNOSIS — M810 Age-related osteoporosis without current pathological fracture: Secondary | ICD-10-CM | POA: Diagnosis not present

## 2018-10-06 DIAGNOSIS — R42 Dizziness and giddiness: Secondary | ICD-10-CM | POA: Diagnosis not present

## 2018-10-06 DIAGNOSIS — S80812D Abrasion, left lower leg, subsequent encounter: Secondary | ICD-10-CM | POA: Diagnosis not present

## 2018-10-06 DIAGNOSIS — M7541 Impingement syndrome of right shoulder: Secondary | ICD-10-CM | POA: Diagnosis not present

## 2018-10-07 DIAGNOSIS — E785 Hyperlipidemia, unspecified: Secondary | ICD-10-CM | POA: Diagnosis not present

## 2018-10-07 DIAGNOSIS — K219 Gastro-esophageal reflux disease without esophagitis: Secondary | ICD-10-CM | POA: Diagnosis not present

## 2018-10-07 DIAGNOSIS — R6884 Jaw pain: Secondary | ICD-10-CM | POA: Diagnosis not present

## 2018-10-07 DIAGNOSIS — M810 Age-related osteoporosis without current pathological fracture: Secondary | ICD-10-CM | POA: Diagnosis not present

## 2018-10-07 DIAGNOSIS — M7541 Impingement syndrome of right shoulder: Secondary | ICD-10-CM | POA: Diagnosis not present

## 2018-10-07 DIAGNOSIS — L03116 Cellulitis of left lower limb: Secondary | ICD-10-CM | POA: Diagnosis not present

## 2018-10-07 DIAGNOSIS — I872 Venous insufficiency (chronic) (peripheral): Secondary | ICD-10-CM | POA: Diagnosis not present

## 2018-10-07 DIAGNOSIS — Z96641 Presence of right artificial hip joint: Secondary | ICD-10-CM | POA: Diagnosis not present

## 2018-10-07 DIAGNOSIS — G8929 Other chronic pain: Secondary | ICD-10-CM | POA: Diagnosis not present

## 2018-10-07 DIAGNOSIS — I129 Hypertensive chronic kidney disease with stage 1 through stage 4 chronic kidney disease, or unspecified chronic kidney disease: Secondary | ICD-10-CM | POA: Diagnosis not present

## 2018-10-07 DIAGNOSIS — S80811D Abrasion, right lower leg, subsequent encounter: Secondary | ICD-10-CM | POA: Diagnosis not present

## 2018-10-07 DIAGNOSIS — R42 Dizziness and giddiness: Secondary | ICD-10-CM | POA: Diagnosis not present

## 2018-10-07 DIAGNOSIS — N183 Chronic kidney disease, stage 3 (moderate): Secondary | ICD-10-CM | POA: Diagnosis not present

## 2018-10-07 DIAGNOSIS — Z7982 Long term (current) use of aspirin: Secondary | ICD-10-CM | POA: Diagnosis not present

## 2018-10-07 DIAGNOSIS — S80812D Abrasion, left lower leg, subsequent encounter: Secondary | ICD-10-CM | POA: Diagnosis not present

## 2018-10-07 DIAGNOSIS — G801 Spastic diplegic cerebral palsy: Secondary | ICD-10-CM | POA: Diagnosis not present

## 2018-10-07 DIAGNOSIS — H47013 Ischemic optic neuropathy, bilateral: Secondary | ICD-10-CM | POA: Diagnosis not present

## 2018-10-07 DIAGNOSIS — Z792 Long term (current) use of antibiotics: Secondary | ICD-10-CM | POA: Diagnosis not present

## 2018-10-12 DIAGNOSIS — I872 Venous insufficiency (chronic) (peripheral): Secondary | ICD-10-CM | POA: Diagnosis not present

## 2018-10-12 DIAGNOSIS — I129 Hypertensive chronic kidney disease with stage 1 through stage 4 chronic kidney disease, or unspecified chronic kidney disease: Secondary | ICD-10-CM | POA: Diagnosis not present

## 2018-10-12 DIAGNOSIS — N183 Chronic kidney disease, stage 3 (moderate): Secondary | ICD-10-CM | POA: Diagnosis not present

## 2018-10-12 DIAGNOSIS — E785 Hyperlipidemia, unspecified: Secondary | ICD-10-CM | POA: Diagnosis not present

## 2018-10-12 DIAGNOSIS — Z96641 Presence of right artificial hip joint: Secondary | ICD-10-CM | POA: Diagnosis not present

## 2018-10-12 DIAGNOSIS — G8929 Other chronic pain: Secondary | ICD-10-CM | POA: Diagnosis not present

## 2018-10-12 DIAGNOSIS — Z7982 Long term (current) use of aspirin: Secondary | ICD-10-CM | POA: Diagnosis not present

## 2018-10-12 DIAGNOSIS — G801 Spastic diplegic cerebral palsy: Secondary | ICD-10-CM | POA: Diagnosis not present

## 2018-10-12 DIAGNOSIS — S80812D Abrasion, left lower leg, subsequent encounter: Secondary | ICD-10-CM | POA: Diagnosis not present

## 2018-10-12 DIAGNOSIS — R42 Dizziness and giddiness: Secondary | ICD-10-CM | POA: Diagnosis not present

## 2018-10-12 DIAGNOSIS — L03116 Cellulitis of left lower limb: Secondary | ICD-10-CM | POA: Diagnosis not present

## 2018-10-12 DIAGNOSIS — S80811D Abrasion, right lower leg, subsequent encounter: Secondary | ICD-10-CM | POA: Diagnosis not present

## 2018-10-12 DIAGNOSIS — R6884 Jaw pain: Secondary | ICD-10-CM | POA: Diagnosis not present

## 2018-10-12 DIAGNOSIS — M7541 Impingement syndrome of right shoulder: Secondary | ICD-10-CM | POA: Diagnosis not present

## 2018-10-12 DIAGNOSIS — K219 Gastro-esophageal reflux disease without esophagitis: Secondary | ICD-10-CM | POA: Diagnosis not present

## 2018-10-12 DIAGNOSIS — M810 Age-related osteoporosis without current pathological fracture: Secondary | ICD-10-CM | POA: Diagnosis not present

## 2018-10-12 DIAGNOSIS — Z792 Long term (current) use of antibiotics: Secondary | ICD-10-CM | POA: Diagnosis not present

## 2018-10-12 DIAGNOSIS — H47013 Ischemic optic neuropathy, bilateral: Secondary | ICD-10-CM | POA: Diagnosis not present

## 2018-10-14 DIAGNOSIS — G8929 Other chronic pain: Secondary | ICD-10-CM | POA: Diagnosis not present

## 2018-10-14 DIAGNOSIS — I872 Venous insufficiency (chronic) (peripheral): Secondary | ICD-10-CM | POA: Diagnosis not present

## 2018-10-14 DIAGNOSIS — M7541 Impingement syndrome of right shoulder: Secondary | ICD-10-CM | POA: Diagnosis not present

## 2018-10-14 DIAGNOSIS — Z792 Long term (current) use of antibiotics: Secondary | ICD-10-CM | POA: Diagnosis not present

## 2018-10-14 DIAGNOSIS — S80811D Abrasion, right lower leg, subsequent encounter: Secondary | ICD-10-CM | POA: Diagnosis not present

## 2018-10-14 DIAGNOSIS — G801 Spastic diplegic cerebral palsy: Secondary | ICD-10-CM | POA: Diagnosis not present

## 2018-10-14 DIAGNOSIS — M810 Age-related osteoporosis without current pathological fracture: Secondary | ICD-10-CM | POA: Diagnosis not present

## 2018-10-14 DIAGNOSIS — R42 Dizziness and giddiness: Secondary | ICD-10-CM | POA: Diagnosis not present

## 2018-10-14 DIAGNOSIS — R6884 Jaw pain: Secondary | ICD-10-CM | POA: Diagnosis not present

## 2018-10-14 DIAGNOSIS — N183 Chronic kidney disease, stage 3 (moderate): Secondary | ICD-10-CM | POA: Diagnosis not present

## 2018-10-14 DIAGNOSIS — Z96641 Presence of right artificial hip joint: Secondary | ICD-10-CM | POA: Diagnosis not present

## 2018-10-14 DIAGNOSIS — I129 Hypertensive chronic kidney disease with stage 1 through stage 4 chronic kidney disease, or unspecified chronic kidney disease: Secondary | ICD-10-CM | POA: Diagnosis not present

## 2018-10-14 DIAGNOSIS — L03116 Cellulitis of left lower limb: Secondary | ICD-10-CM | POA: Diagnosis not present

## 2018-10-14 DIAGNOSIS — H47013 Ischemic optic neuropathy, bilateral: Secondary | ICD-10-CM | POA: Diagnosis not present

## 2018-10-14 DIAGNOSIS — E785 Hyperlipidemia, unspecified: Secondary | ICD-10-CM | POA: Diagnosis not present

## 2018-10-14 DIAGNOSIS — K219 Gastro-esophageal reflux disease without esophagitis: Secondary | ICD-10-CM | POA: Diagnosis not present

## 2018-10-14 DIAGNOSIS — S80812D Abrasion, left lower leg, subsequent encounter: Secondary | ICD-10-CM | POA: Diagnosis not present

## 2018-10-14 DIAGNOSIS — Z7982 Long term (current) use of aspirin: Secondary | ICD-10-CM | POA: Diagnosis not present

## 2018-10-21 ENCOUNTER — Telehealth: Payer: Self-pay | Admitting: *Deleted

## 2018-10-21 DIAGNOSIS — G808 Other cerebral palsy: Secondary | ICD-10-CM

## 2018-10-21 NOTE — Telephone Encounter (Signed)
Pt calls and states HHN ended last week but now she has a new area on L leg that is looking like like it is going to open up she ask if we can re order [facility] for treatment, maybe eval and treat or does she need an appt? Needs f/u on order for walker Needs a bedside commode and also she is still having loose stools not watery or extreme foul odor but bothersome, she states this started when she started abx, she states it is 1x daily Please advise

## 2018-10-22 DIAGNOSIS — Z792 Long term (current) use of antibiotics: Secondary | ICD-10-CM | POA: Diagnosis not present

## 2018-10-22 DIAGNOSIS — S80811D Abrasion, right lower leg, subsequent encounter: Secondary | ICD-10-CM | POA: Diagnosis not present

## 2018-10-22 DIAGNOSIS — Z96641 Presence of right artificial hip joint: Secondary | ICD-10-CM | POA: Diagnosis not present

## 2018-10-22 DIAGNOSIS — H47013 Ischemic optic neuropathy, bilateral: Secondary | ICD-10-CM | POA: Diagnosis not present

## 2018-10-22 DIAGNOSIS — M810 Age-related osteoporosis without current pathological fracture: Secondary | ICD-10-CM | POA: Diagnosis not present

## 2018-10-22 DIAGNOSIS — I872 Venous insufficiency (chronic) (peripheral): Secondary | ICD-10-CM | POA: Diagnosis not present

## 2018-10-22 DIAGNOSIS — S80812D Abrasion, left lower leg, subsequent encounter: Secondary | ICD-10-CM | POA: Diagnosis not present

## 2018-10-22 DIAGNOSIS — R2681 Unsteadiness on feet: Secondary | ICD-10-CM | POA: Diagnosis not present

## 2018-10-22 DIAGNOSIS — Z7982 Long term (current) use of aspirin: Secondary | ICD-10-CM | POA: Diagnosis not present

## 2018-10-22 DIAGNOSIS — G801 Spastic diplegic cerebral palsy: Secondary | ICD-10-CM | POA: Diagnosis not present

## 2018-10-22 DIAGNOSIS — I129 Hypertensive chronic kidney disease with stage 1 through stage 4 chronic kidney disease, or unspecified chronic kidney disease: Secondary | ICD-10-CM | POA: Diagnosis not present

## 2018-10-22 DIAGNOSIS — N183 Chronic kidney disease, stage 3 (moderate): Secondary | ICD-10-CM | POA: Diagnosis not present

## 2018-10-22 DIAGNOSIS — G809 Cerebral palsy, unspecified: Secondary | ICD-10-CM | POA: Diagnosis not present

## 2018-10-22 DIAGNOSIS — E785 Hyperlipidemia, unspecified: Secondary | ICD-10-CM | POA: Diagnosis not present

## 2018-10-22 DIAGNOSIS — R6884 Jaw pain: Secondary | ICD-10-CM | POA: Diagnosis not present

## 2018-10-22 DIAGNOSIS — L03116 Cellulitis of left lower limb: Secondary | ICD-10-CM | POA: Diagnosis not present

## 2018-10-22 DIAGNOSIS — G8929 Other chronic pain: Secondary | ICD-10-CM | POA: Diagnosis not present

## 2018-10-22 DIAGNOSIS — M7541 Impingement syndrome of right shoulder: Secondary | ICD-10-CM | POA: Diagnosis not present

## 2018-10-22 DIAGNOSIS — R42 Dizziness and giddiness: Secondary | ICD-10-CM | POA: Diagnosis not present

## 2018-10-22 DIAGNOSIS — K219 Gastro-esophageal reflux disease without esophagitis: Secondary | ICD-10-CM | POA: Diagnosis not present

## 2018-10-22 NOTE — Telephone Encounter (Signed)
Ok reorder [place] I aigned DME orders. Can HH collect for C diff? If she is having formed stolls in btw D, C diff not needed.

## 2018-10-25 ENCOUNTER — Encounter (HOSPITAL_COMMUNITY): Payer: Self-pay

## 2018-10-25 ENCOUNTER — Other Ambulatory Visit: Payer: Self-pay

## 2018-10-25 DIAGNOSIS — R112 Nausea with vomiting, unspecified: Secondary | ICD-10-CM | POA: Diagnosis not present

## 2018-10-25 DIAGNOSIS — R197 Diarrhea, unspecified: Secondary | ICD-10-CM | POA: Diagnosis not present

## 2018-10-25 DIAGNOSIS — N183 Chronic kidney disease, stage 3 (moderate): Secondary | ICD-10-CM | POA: Insufficient documentation

## 2018-10-25 DIAGNOSIS — R1111 Vomiting without nausea: Secondary | ICD-10-CM | POA: Diagnosis not present

## 2018-10-25 DIAGNOSIS — Z79899 Other long term (current) drug therapy: Secondary | ICD-10-CM | POA: Diagnosis not present

## 2018-10-25 DIAGNOSIS — E876 Hypokalemia: Secondary | ICD-10-CM | POA: Diagnosis not present

## 2018-10-25 DIAGNOSIS — I129 Hypertensive chronic kidney disease with stage 1 through stage 4 chronic kidney disease, or unspecified chronic kidney disease: Secondary | ICD-10-CM | POA: Insufficient documentation

## 2018-10-25 LAB — COMPREHENSIVE METABOLIC PANEL
ALBUMIN: 4 g/dL (ref 3.5–5.0)
ALT: 27 U/L (ref 0–44)
ANION GAP: 13 (ref 5–15)
AST: 29 U/L (ref 15–41)
Alkaline Phosphatase: 77 U/L (ref 38–126)
BUN: 15 mg/dL (ref 8–23)
CALCIUM: 8.8 mg/dL — AB (ref 8.9–10.3)
CO2: 21 mmol/L — ABNORMAL LOW (ref 22–32)
Chloride: 101 mmol/L (ref 98–111)
Creatinine, Ser: 1.04 mg/dL — ABNORMAL HIGH (ref 0.44–1.00)
GFR calc Af Amer: >60 mL/min (ref >60)
GFR calc non Af Amer: 57 mL/min — ABNORMAL LOW (ref >60)
Glucose, Bld: 106 mg/dL — ABNORMAL HIGH (ref 70–99)
Potassium: 3 mmol/L — ABNORMAL LOW (ref 3.5–5.1)
Sodium: 135 mmol/L (ref 135–145)
Total Bilirubin: 1 mg/dL (ref 0.3–1.2)
Total Protein: 7 g/dL (ref 6.5–8.1)

## 2018-10-25 LAB — CBC
HCT: 39.8 % (ref 36.0–46.0)
Hemoglobin: 12.9 g/dL (ref 12.0–15.0)
MCH: 28.5 pg (ref 26.0–34.0)
MCHC: 32.4 g/dL (ref 30.0–36.0)
MCV: 88.1 fL (ref 80.0–100.0)
Platelets: 190 10*3/uL (ref 150–400)
RBC: 4.52 MIL/uL (ref 3.87–5.11)
RDW: 13.1 % (ref 11.5–15.5)
WBC: 12.8 10*3/uL — ABNORMAL HIGH (ref 4.0–10.5)
nRBC: 0 % (ref 0.0–0.2)

## 2018-10-25 LAB — LIPASE, BLOOD: Lipase: 33 U/L (ref 11–51)

## 2018-10-25 MED ORDER — FAMOTIDINE 10 MG PO TABS: 10.0000 mg | ORAL_TABLET | Freq: Two times a day (BID) | ORAL | 3 refills | Status: DC

## 2018-10-25 NOTE — Telephone Encounter (Signed)
Returned call to patient, no answer.  Left message to call RN back.  SChaplin,RN   

## 2018-10-25 NOTE — Telephone Encounter (Signed)
Called pt - stated Ranitidine causes cancer (recalled),she would like to be changed to something else.  She also stated the area on her left leg is healed, not cellulitis as she once thought. And continues to have small loose stools. Told her about her appt on Thurs w/Dr Lynnae January - stated she had forgotten, will not be able to come d/t transportation issues - changed to 1/16.

## 2018-10-25 NOTE — Telephone Encounter (Signed)
Pt would like to speak with a nurse about med, please call pt back.

## 2018-10-25 NOTE — Telephone Encounter (Signed)
Called pt - no answer; will call tomorrow.

## 2018-10-25 NOTE — ED Triage Notes (Signed)
Pt complains of diarrhea intermittently for three weeks, she states that everytime she eats she has diarrhea Pt states that she was on clindamycin and cellulitis and that's when her diarrhea started, she has been off of it for two weeks and still has diarrhea and she doesn't want to eat Pt states that she vomited right before coming to the ED tongiht

## 2018-10-25 NOTE — Telephone Encounter (Signed)
Pt is calling back to speak with a nurse. Please call pt back.

## 2018-10-26 ENCOUNTER — Emergency Department (HOSPITAL_COMMUNITY)
Admission: EM | Admit: 2018-10-26 | Discharge: 2018-10-26 | Disposition: A | Discharge status: Home / Self Care | Payer: Medicare Other | Attending: Emergency Medicine | Admitting: Emergency Medicine

## 2018-10-26 DIAGNOSIS — Z743 Need for continuous supervision: Secondary | ICD-10-CM | POA: Diagnosis not present

## 2018-10-26 DIAGNOSIS — R197 Diarrhea, unspecified: Secondary | ICD-10-CM | POA: Diagnosis not present

## 2018-10-26 DIAGNOSIS — R112 Nausea with vomiting, unspecified: Secondary | ICD-10-CM

## 2018-10-26 DIAGNOSIS — R1111 Vomiting without nausea: Secondary | ICD-10-CM | POA: Diagnosis not present

## 2018-10-26 DIAGNOSIS — R279 Unspecified lack of coordination: Secondary | ICD-10-CM | POA: Diagnosis not present

## 2018-10-26 LAB — URINALYSIS, ROUTINE W REFLEX MICROSCOPIC
Bilirubin Urine: NEGATIVE
Glucose, UA: NEGATIVE mg/dL
Hgb urine dipstick: NEGATIVE
Ketones, ur: NEGATIVE mg/dL
LEUKOCYTES UA: NEGATIVE
Nitrite: NEGATIVE
Protein, ur: NEGATIVE mg/dL
Specific Gravity, Urine: 1.005 (ref 1.005–1.030)
pH: 5 (ref 5.0–8.0)

## 2018-10-26 MED ORDER — POTASSIUM CHLORIDE 10 MEQ/100ML IV SOLN
10.0000 meq | Freq: Once | INTRAVENOUS | Status: AC
  Administered 2018-10-26: 10 meq via INTRAVENOUS
  Filled 2018-10-26: qty 100

## 2018-10-26 MED ORDER — SODIUM CHLORIDE 0.9 % IV BOLUS
500.0000 mL | Freq: Once | INTRAVENOUS | Status: AC
  Administered 2018-10-26: 02:00:00 via INTRAVENOUS

## 2018-10-26 MED ORDER — ONDANSETRON HCL 4 MG PO TABS: 4.0000 mg | ORAL_TABLET | Freq: Four times a day (QID) | ORAL | 0 refills | Status: DC

## 2018-10-26 NOTE — ED Notes (Addendum)
Pt stood up at bedside to use [facility] with x1 assist.
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
## 2018-10-26 NOTE — ED Notes (Signed)
Pt's stool sample contaminated. Stool sample was formed and soft. [name], [place] made aware.

## 2018-10-26 NOTE — Discharge Instructions (Signed)
Follow up with Dr. Lynnae January this week for recheck. Please return to the emergency department if you develop any fever, severe pain, worse diarrhea or uncontrolled vomiting.

## 2018-10-26 NOTE — Telephone Encounter (Signed)
Called pt again to inform her of new rx -no answer; left message to call the office.

## 2018-10-26 NOTE — ED Notes (Signed)
Pt provided breakfast tray.  Informed pt that transport will be called for her when she'd done eating.

## 2018-10-27 ENCOUNTER — Telehealth: Payer: Self-pay

## 2018-10-27 DIAGNOSIS — G808 Other cerebral palsy: Secondary | ICD-10-CM

## 2018-10-27 NOTE — Telephone Encounter (Signed)
Thought HH was VO. Just put in new order. If having formed stools do NOT collect stool for C diff.

## 2018-10-27 NOTE — Telephone Encounter (Signed)
rec'd call from [facility], pt was in ED 1/7 for her gi issues, they were unable to obtain stool sample for C DIFF, pt is unable to come to clinic due to transportation issues per [place]. Also asking about DME and HH referral. Spoke to laurend. rn r/t HH and DME. Also called pt and informed her that clinic is in process of notifying [facility] and dr Software engineer has signed DME orders, ask about coming to clinic and she states she does not have transportation til next week. Will call Daleen Snook back and see if there is another way to help pt come to clinic.

## 2018-10-27 NOTE — Telephone Encounter (Signed)
Needs to speak with nurse about something, please call pt back.

## 2018-10-28 ENCOUNTER — Encounter (HOSPITAL_COMMUNITY): Payer: Self-pay | Admitting: Emergency Medicine

## 2018-10-28 ENCOUNTER — Encounter: Payer: Self-pay | Admitting: Internal Medicine

## 2018-10-28 ENCOUNTER — Emergency Department (HOSPITAL_COMMUNITY): Payer: Medicare Other

## 2018-10-28 ENCOUNTER — Emergency Department (HOSPITAL_COMMUNITY)
Admission: EM | Admit: 2018-10-28 | Discharge: 2018-10-28 | Disposition: A | Discharge status: Home / Self Care | Payer: Medicare Other | Attending: Emergency Medicine | Admitting: Emergency Medicine

## 2018-10-28 DIAGNOSIS — Z79899 Other long term (current) drug therapy: Secondary | ICD-10-CM | POA: Insufficient documentation

## 2018-10-28 DIAGNOSIS — Z7982 Long term (current) use of aspirin: Secondary | ICD-10-CM | POA: Diagnosis not present

## 2018-10-28 DIAGNOSIS — Z7401 Bed confinement status: Secondary | ICD-10-CM | POA: Diagnosis not present

## 2018-10-28 DIAGNOSIS — K449 Diaphragmatic hernia without obstruction or gangrene: Secondary | ICD-10-CM | POA: Diagnosis not present

## 2018-10-28 DIAGNOSIS — L0390 Cellulitis, unspecified: Secondary | ICD-10-CM | POA: Diagnosis not present

## 2018-10-28 DIAGNOSIS — K8020 Calculus of gallbladder without cholecystitis without obstruction: Secondary | ICD-10-CM | POA: Diagnosis not present

## 2018-10-28 DIAGNOSIS — N281 Cyst of kidney, acquired: Secondary | ICD-10-CM | POA: Diagnosis not present

## 2018-10-28 DIAGNOSIS — N183 Chronic kidney disease, stage 3 (moderate): Secondary | ICD-10-CM | POA: Insufficient documentation

## 2018-10-28 DIAGNOSIS — G809 Cerebral palsy, unspecified: Secondary | ICD-10-CM | POA: Insufficient documentation

## 2018-10-28 DIAGNOSIS — I129 Hypertensive chronic kidney disease with stage 1 through stage 4 chronic kidney disease, or unspecified chronic kidney disease: Secondary | ICD-10-CM | POA: Diagnosis not present

## 2018-10-28 DIAGNOSIS — K529 Noninfective gastroenteritis and colitis, unspecified: Secondary | ICD-10-CM

## 2018-10-28 DIAGNOSIS — M2550 Pain in unspecified joint: Secondary | ICD-10-CM | POA: Diagnosis not present

## 2018-10-28 DIAGNOSIS — R110 Nausea: Secondary | ICD-10-CM | POA: Diagnosis not present

## 2018-10-28 DIAGNOSIS — R197 Diarrhea, unspecified: Secondary | ICD-10-CM | POA: Diagnosis not present

## 2018-10-28 DIAGNOSIS — E785 Hyperlipidemia, unspecified: Secondary | ICD-10-CM | POA: Diagnosis not present

## 2018-10-28 DIAGNOSIS — R1084 Generalized abdominal pain: Secondary | ICD-10-CM | POA: Diagnosis not present

## 2018-10-28 DIAGNOSIS — I10 Essential (primary) hypertension: Secondary | ICD-10-CM | POA: Diagnosis not present

## 2018-10-28 LAB — URINALYSIS, ROUTINE W REFLEX MICROSCOPIC
Bilirubin Urine: NEGATIVE
Glucose, UA: NEGATIVE mg/dL
Ketones, ur: NEGATIVE mg/dL
Nitrite: NEGATIVE
PROTEIN: NEGATIVE mg/dL
Specific Gravity, Urine: 1.005 (ref 1.005–1.030)
pH: 5 (ref 5.0–8.0)

## 2018-10-28 LAB — CBC WITH DIFFERENTIAL/PLATELET
Abs Immature Granulocytes: 0.66 10*3/uL — ABNORMAL HIGH (ref 0.00–0.07)
Basophils Absolute: 0.1 10*3/uL (ref 0.0–0.1)
Basophils Relative: 1 %
Eosinophils Absolute: 0.4 10*3/uL (ref 0.0–0.5)
Eosinophils Relative: 3 %
HEMATOCRIT: 38.5 % (ref 36.0–46.0)
Hemoglobin: 12.6 g/dL (ref 12.0–15.0)
Immature Granulocytes: 5 %
LYMPHS ABS: 1.7 10*3/uL (ref 0.7–4.0)
Lymphocytes Relative: 12 %
MCH: 28.4 pg (ref 26.0–34.0)
MCHC: 32.7 g/dL (ref 30.0–36.0)
MCV: 86.7 fL (ref 80.0–100.0)
Monocytes Absolute: 1 10*3/uL (ref 0.1–1.0)
Monocytes Relative: 7 %
NEUTROS ABS: 10 10*3/uL — AB (ref 1.7–7.7)
Neutrophils Relative %: 72 %
Platelets: 223 10*3/uL (ref 150–400)
RBC: 4.44 MIL/uL (ref 3.87–5.11)
RDW: 13.1 % (ref 11.5–15.5)
WBC: 13.8 10*3/uL — ABNORMAL HIGH (ref 4.0–10.5)
nRBC: 0 % (ref 0.0–0.2)

## 2018-10-28 LAB — COMPREHENSIVE METABOLIC PANEL
ALT: 21 U/L (ref 0–44)
AST: 19 U/L (ref 15–41)
Albumin: 3.4 g/dL — ABNORMAL LOW (ref 3.5–5.0)
Alkaline Phosphatase: 69 U/L (ref 38–126)
Anion gap: 14 (ref 5–15)
BUN: 15 mg/dL (ref 8–23)
CALCIUM: 8.8 mg/dL — AB (ref 8.9–10.3)
CO2: 21 mmol/L — ABNORMAL LOW (ref 22–32)
Chloride: 101 mmol/L (ref 98–111)
Creatinine, Ser: 1.07 mg/dL — ABNORMAL HIGH (ref 0.44–1.00)
GFR calc non Af Amer: 55 mL/min — ABNORMAL LOW (ref >60)
Glucose, Bld: 102 mg/dL — ABNORMAL HIGH (ref 70–99)
Potassium: 3.2 mmol/L — ABNORMAL LOW (ref 3.5–5.1)
Sodium: 136 mmol/L (ref 135–145)
Total Bilirubin: 0.7 mg/dL (ref 0.3–1.2)
Total Protein: 6.4 g/dL — ABNORMAL LOW (ref 6.5–8.1)

## 2018-10-28 LAB — POC OCCULT BLOOD, ED: FECAL OCCULT BLD: NEGATIVE

## 2018-10-28 LAB — LIPASE, BLOOD: Lipase: 29 U/L (ref 11–51)

## 2018-10-28 MED ORDER — IOHEXOL 300 MG/ML  SOLN
100.0000 mL | Freq: Once | INTRAMUSCULAR | Status: AC | PRN
  Administered 2018-10-28: 100 mL via INTRAVENOUS

## 2018-10-28 MED ORDER — CIPROFLOXACIN IN D5W 400 MG/200ML IV SOLN
400.0000 mg | Freq: Once | INTRAVENOUS | Status: AC
  Administered 2018-10-28: 400 mg via INTRAVENOUS
  Filled 2018-10-28: qty 200

## 2018-10-28 MED ORDER — ONDANSETRON HCL 4 MG PO TABS: 4.0000 mg | ORAL_TABLET | Freq: Three times a day (TID) | ORAL | 0 refills | Status: DC | PRN

## 2018-10-28 MED ORDER — METRONIDAZOLE 500 MG PO TABS: 500.0000 mg | ORAL_TABLET | Freq: Two times a day (BID) | ORAL | 0 refills | Status: DC

## 2018-10-28 MED ORDER — SODIUM CHLORIDE 0.9 % IV BOLUS
1000.0000 mL | Freq: Once | INTRAVENOUS | Status: AC
  Administered 2018-10-28: 1000 mL via INTRAVENOUS

## 2018-10-28 MED ORDER — POTASSIUM CHLORIDE CRYS ER 20 MEQ PO TBCR
40.0000 meq | EXTENDED_RELEASE_TABLET | Freq: Once | ORAL | Status: AC
  Administered 2018-10-28: 40 meq via ORAL
  Filled 2018-10-28: qty 2

## 2018-10-28 MED ORDER — CIPROFLOXACIN HCL 500 MG PO TABS: 500.0000 mg | ORAL_TABLET | Freq: Two times a day (BID) | ORAL | 0 refills | Status: DC

## 2018-10-28 MED ORDER — METRONIDAZOLE IN NACL 5-0.79 MG/ML-% IV SOLN
500.0000 mg | Freq: Once | INTRAVENOUS | Status: AC
  Administered 2018-10-28: 500 mg via INTRAVENOUS
  Filled 2018-10-28: qty 100

## 2018-10-28 MED ORDER — ONDANSETRON HCL 4 MG/2ML IJ SOLN
4.0000 mg | Freq: Once | INTRAMUSCULAR | Status: AC
  Administered 2018-10-28: 4 mg via INTRAVENOUS
  Filled 2018-10-28: qty 2

## 2018-10-28 NOTE — ED Notes (Signed)
Placed patient on the bedpan  

## 2018-10-28 NOTE — ED Notes (Signed)
Pt given turkey sandwich and sprite.  

## 2018-10-28 NOTE — ED Notes (Signed)
Pt given a warm blanket 

## 2018-10-28 NOTE — Discharge Instructions (Signed)
You have been diagnosed with colitis, or inflammation of your colon.  Please take antibiotics as prescribed for the full duration.  Follow up with your doctor for further care.  Return if your condition worsen or if you have any other concerns.

## 2018-10-28 NOTE — Addendum Note (Signed)
Addended by: Larey Dresser A on: 10/28/2018 01:00 PM   Modules accepted: Orders
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
## 2018-10-28 NOTE — ED Triage Notes (Signed)
Pt from home per EMS, has had diarrhia X3 weeks which started when she began antibiotics however she has been off them for 10 days.

## 2018-10-29 ENCOUNTER — Telehealth: Payer: Self-pay | Admitting: Internal Medicine

## 2018-10-29 ENCOUNTER — Other Ambulatory Visit: Payer: Self-pay | Admitting: *Deleted

## 2018-10-29 NOTE — Patient Outreach (Signed)
[facility]) Care Management  10/29/2018  Kathy Owens [birth year] [id number]   Subjective: Telephone call to patient's home  / mobile number, no answer, left HIPAA compliant voicemail message, and requested call back.    Objective: Per chart review, patient has not had any recent hospitalization, and had a ED visit in 10/28/2018 for colitis.   Patient also has a history of CP (cerebral palsy), spastic, hypertension, hyperlipidemia, Venous insufficiency of leg, and stage 3 kidney disease.     Assessment: Received [employer] Utilization Management referral on 10/29/2018 for recent ED visit and needing assistance with transportation to MD appointments.  Screening follow up pending patient contact.     Plan: RNCM will send unsuccessful outreach  letter, [facility] pamphlet, will call patient for 2nd telephone outreach attempt within 4 business days, screening follow up, and will proceed with case closure within 10 business days if no return call.     Kathy Owens H. Annia Friendly, BSN, [place] Management [facility] Telephonic CM Phone: [phone] Fax: [phone]

## 2018-10-29 NOTE — Addendum Note (Signed)
Addended by: Larey Dresser A on: 10/29/2018 07:52 AM   Modules accepted: Orders

## 2018-10-29 NOTE — Telephone Encounter (Signed)
Mrs Martinique said the call got disconnection, can you please call her back [phone]

## 2018-10-29 NOTE — Telephone Encounter (Signed)
I reviewed the ER note yesterday.  CT scan showed right-sided colitis.  Due to recent clindamycin use and the detail that the diarrhea started after she finished the antibiotics, C. difficile is the etiology of her symptoms and the x-ray findings until proven otherwise.  She was prescribed Cipro and metronidazole in the ED yesterday.  I am concerned about this therapy as although the metronidazole might help C. difficile, it is not standard therapy for C. difficile and the Cipro would simply continue to put her at risk for C. difficile.  I tried to call her but there was no answer.  I left a message asking her to call [phone].  I need to get more details about her diarrhea.  How many stools is she having in a day?  Are all of the stools still unformed or is she having formed stools?  Why has she not been able to give a stool sample?  Does she have abdominal pain?  If she able to eat well?  Ideally, she would not take the Cipro and metronidazole and submit a stool sample for a stat C. difficile antigen and toxin test.  Would you please try to follow-up with her?

## 2018-10-29 NOTE — Telephone Encounter (Signed)
The diarrhea started with the first dose of clindamycin on December 2.  She completed therapy on December 12. Her last liquid bowel movement was Wednesday the eighth.  She did not have a bowel movement on the ninth.  On the ninth, the ER gave her 1 dose of IV Cipro and metronidazole.  She had a formed bowel movement today and started her oral metronidazole and Cipro this morning.  I doubt antibiotic associated diarrhea would have lasted almost 30 days after the last dose of antibiotics.  I am still worried about C. difficile even though her bowel movement solidified this morning.  The CT findings could definitely be consistent with C. difficile.  It is a particular concern now that she is on Cipro and metronidazole.  I suppose she could have developed non-C. difficile bacterial colitis while having antibiotic associated diarrhea but it seems unlikely.  C. difficile will not be run on her stool now that it is formed.  Therefore,  I am okay continuing metronidazole and Cipro.  If she develops loose stools again, she will need a C. difficile test.  The only other option I have would be to tell her to not take the Cipro and metronidazole and see if the diarrhea returns at which point I could test for C. difficile.  I do not like this option as she is already started the antibiotic and there are risk with stopping it.  My hands are sort of tied since antibiotics were started for a colitis without ruling out C. difficile first in this high-risk patient.

## 2018-11-01 ENCOUNTER — Other Ambulatory Visit: Payer: Self-pay | Admitting: *Deleted

## 2018-11-01 NOTE — Patient Outreach (Signed)
[name]) Care Management  11/01/2018  Kathy Owens [birth year] [id number]   Subjective: Received secure email from [facility] Utilization Management Certified Medical Assistant [name]) states she has spoken with patient, states patient no longer needs transportation assistance, and has transportation to her appointments.     Objective: Per chart review, patient has not had any recent hospitalization, and had a ED visit in 10/28/2018 for colitis.   Patient also has a history of CP (cerebral palsy), spastic, hypertension, hyperlipidemia, Venous insufficiency of leg, and stage 3 kidney disease.     Assessment: Received [employer] Utilization Management referral on 10/29/2018 for recent ED visit and needing assistance with transportation to MD appointments.  Screening follow up not needed, patient no longer needs assistance per referral source, and will proceed with case closure.    Plan: RNCM will complete case closure due to follow up completed / no care management needs.  RNCM will send MD case closure letter.      [name] H. Annia Friendly, BSN, [place] Management [facility] Telephonic CM Phone: [phone] Fax: [phone]

## 2018-11-02 NOTE — Telephone Encounter (Signed)
DME oders for [facility] and walker were faxed to [facility] on 10/22/2018. Sent message to Jiles Crocker at [facility] today to see if these orders have been processed. Awaiting response.  Patient had [place] with [facility] as of 11/17/2016. Have sent community message to Adela Lank with [facility] to see if they can restart care. Awaiting reply. Hubbard Hartshorn, RN, BSN

## 2018-11-02 NOTE — Telephone Encounter (Signed)
Following response received from Darlina Guys at [facility]:  Looks like we did receive this order on 1/3 but had to call and leave a message for the patient.  She has received a same or similar item within the last five years so she would need to private pay for this.  I don't think that she has returned our call.

## 2018-11-03 NOTE — Telephone Encounter (Signed)
thanks

## 2018-11-03 NOTE — Telephone Encounter (Signed)
Received [employer] from [place] at [facility] that both DME items were shipped to patient on 10/22/2018. Call placed to patient. No answer. Left VM requesting return call to ensure she received these items. Hubbard Hartshorn, RN, BSN

## 2018-11-04 ENCOUNTER — Ambulatory Visit (INDEPENDENT_AMBULATORY_CARE_PROVIDER_SITE_OTHER): Payer: Medicare Other | Admitting: Internal Medicine

## 2018-11-04 ENCOUNTER — Encounter: Payer: Self-pay | Admitting: Internal Medicine

## 2018-11-04 VITALS — BP 135/79 | HR 75 | Temp 98.0°F | Wt 151.6 lb

## 2018-11-04 DIAGNOSIS — Z79899 Other long term (current) drug therapy: Secondary | ICD-10-CM | POA: Diagnosis not present

## 2018-11-04 DIAGNOSIS — Z8619 Personal history of other infectious and parasitic diseases: Secondary | ICD-10-CM

## 2018-11-04 DIAGNOSIS — K219 Gastro-esophageal reflux disease without esophagitis: Secondary | ICD-10-CM | POA: Diagnosis not present

## 2018-11-04 DIAGNOSIS — A0472 Enterocolitis due to Clostridium difficile, not specified as recurrent: Secondary | ICD-10-CM | POA: Diagnosis not present

## 2018-11-04 DIAGNOSIS — R197 Diarrhea, unspecified: Secondary | ICD-10-CM

## 2018-11-04 DIAGNOSIS — K828 Other specified diseases of gallbladder: Secondary | ICD-10-CM | POA: Diagnosis not present

## 2018-11-04 HISTORY — DX: Personal history of other infectious and parasitic diseases: Z86.19

## 2018-11-04 LAB — CLOSTRIDIUM DIFFICILE BY PCR, REFLEXED: Toxigenic C. Difficile by PCR: POSITIVE — AB

## 2018-11-04 LAB — C DIFFICILE QUICK SCREEN W PCR REFLEX
C Diff antigen: POSITIVE — AB
C Diff toxin: NEGATIVE

## 2018-11-04 MED ORDER — VANCOMYCIN HCL 125 MG PO CAPS: 125.0000 mg | ORAL_CAPSULE | Freq: Four times a day (QID) | ORAL | 0 refills | Status: DC

## 2018-11-04 MED ORDER — FAMOTIDINE 10 MG PO TABS: 10.0000 mg | ORAL_TABLET | Freq: Two times a day (BID) | ORAL | 3 refills | Status: DC

## 2018-11-04 NOTE — Telephone Encounter (Signed)
Confirmed with patient at today's OV that she received both BSC and rolator.   Cory with [facility] sent community message that he is unable to take patient for [facility]. Will send message to Darlina Guys at [facility] to see if she can take patient for [facility] PT and RN. Hubbard Hartshorn, RN, BSN

## 2018-11-04 NOTE — Assessment & Plan Note (Signed)
This is a new problem.  This was an incidental finding on CT imaging done by the ED on January 9.  I showed Kathy Owens the images and discussed with her my strong recommendation that she see a Psychologist, sport and exercise and she agreed.  She is distressed at hearing the news that she might need a cholecystectomy and the risk of cancer associated with a porcelain gallbladder but I discussed that she and her surgeon could decide the next best step.  It seems her calcification is complete mural calcification (based on my review of the images and I am not a radiologist) and combined with the fact that she is asymptomatic, reading of the literature indicates that conservative management might be an issue but I will leave that entirely to the surgeon.  It also appears that the malignancy risk is much less based on newer studies and it might be lower if in fact her calcification is complete if he has complete mural calcification.  PLAN : referral to general surgery to discuss management of her calcified gallbladder

## 2018-11-04 NOTE — Progress Notes (Signed)
   Subjective:    Patient ID: Kathy Owens, female    DOB: 1956-09-12, 63 y.o.   MRN: [id number]  HPI  Kathy Owens is here for dairrhea. Please see the A&P for the status of the pt's chronic medical problems.  ROS : per ROS section and in problem oriented charting. All other systems are negative.  PMHx, Soc hx, and / or Fam hx : No loger has home health services  Review of Systems  Constitutional: Negative for appetite change.  Gastrointestinal: Positive for diarrhea. Negative for abdominal pain.  Neurological: Positive for dizziness and light-headedness.       Objective:   Physical Exam Constitutional:      General: She is not in acute distress.    Appearance: Normal appearance. She is obese. She is ill-appearing. She is not toxic-appearing or diaphoretic.  HENT:     Head: Normocephalic and atraumatic.     Nose: Nose normal. No rhinorrhea.  Cardiovascular:     Rate and Rhythm: Normal rate and regular rhythm.     Heart sounds: No murmur.  Pulmonary:     Effort: Pulmonary effort is normal.  Abdominal:     General: Bowel sounds are normal. There is no distension.     Palpations: Abdomen is soft.     Tenderness: There is no abdominal tenderness. There is no guarding.  Skin:    General: Skin is warm and dry.     Coloration: Skin is not jaundiced.     Findings: No lesion or rash.  Neurological:     General: No focal deficit present.     Mental Status: She is alert. Mental status is at baseline.  Psychiatric:        Mood and Affect: Mood normal.        Behavior: Behavior normal.        Thought Content: Thought content normal.        Judgment: Judgment normal.           Assessment & Plan:

## 2018-11-04 NOTE — Patient Instructions (Signed)
1. I am checking you for C diff 2. I will change you to Pepcid 3. I am sending you to a surgeon for your gallbladder 4. I am sorry you have having stomach issues

## 2018-11-04 NOTE — Telephone Encounter (Signed)
Have not heard back from [facility] with regards to order for [facility] PT and RN. Spoke with Sharmon Revere, RN with [facility]. She may be able to take patient for [facility] PT and RN. Will let us know. Hubbard Hartshorn, RN, BSN

## 2018-11-04 NOTE — Assessment & Plan Note (Addendum)
This problem is chronic and controlled.  She had been on ranitidine but that has been pulled off the market.  She requests something else that does not cause cancer or is harmful to her kidneys.  I had sent in Pepcid 10 mg twice daily but the midlevel in the ED deleted it off of her medication list.  I have resent it to [facility].  PLAN : Pepcid 10 mg twice daily    When I talked with her to give her her C. difficile results, she stated it is too expensive to get the Pepcid using her insurance which cost $25.  It is $12 without using her insurance at [facility].  She states she is not able to get to any other pharmacy.  From Dr Maudie Mercury :  I would try the OTC options at [facility]. It looks like Pepcid is $3. I have also heard that [facility] has $1 options, but not sure what they have.

## 2018-11-04 NOTE — Assessment & Plan Note (Addendum)
This problem is new.  She was started on clindamycin on December 2 for a lower extremity cellulitis.  She states she got diarrhea on the second day of her clindamycin course.  The diarrhea continued even after the clindamycin was finished.  She had no resolution at all of her diarrhea from it then until today.  She was seen by the ED on January 7 due to ongoing diarrhea and increase of abdominal pain.  C. difficile was on top of the differential.  She was given IV fluids and antiemetics.  It was reported that she had a bowel movement in the ED which was formed but mixed with urine and so it was not sent for C. difficile testing.  She continued to feel poorly and return to the ED on January 9.  Again, C. difficile was top of the differential but the patient was not able to give a stool sample.  A CT of the abdomen was then ordered "to assess for potential colitis or other abdominal acute pathology."  The CT was positive for right-sided colitis, the patient was given 1 dose of IV metronidazole and ciprofloxacin, and was prescribed ametronidazole and ciprofloxacin.  I saw this note the following day, on the 10th, and was quite concerned that the wrong diagnosis and wrong treatment have been prescribed.  See if this causes a colitis.  I felt the symptoms and the imaging findings are all consistent with C. difficile.  I called the patient and she stated her bowel movement that morning was formed and she thought a little bit better.  C. difficile will not be run on a formed bowel movement so my hands were tied and I had few options other than allowing her to continue the Cipro and metronidazole and see what happened.  This morning, she is still in bad shape.  I could see stool on her right shoe and there was also what appeared to be some dried stool on her shirt.  She endorsed ongoing non-formed stool and states it is occurring daily.  She now denies abdominal pain.  She does, on direct questioning, endorses fecal  incontinence if her trip to the toilet is hindered.  We gave her some coffee and water and she was able to produce a stool sample which was positive for C. difficile PCR, positive for C. difficile antigen, but negative for C. difficile toxin.  I think this is because she was inappropriately prescribed metronidazole which partially treated her C. difficile resulting in a decrease in the toxin production.  I have called her but did not get an answer.  I will continue to attempt to call her and tell her to stop the metronidazole and ciprofloxacin and instruct her to pick up and take the vancomycin 125 mg PO QID. I will also talk to her about hygiene and bleaching her bathroom and other surfaces which are commonly touched in her house.  PLAN : D/C metronidazole and ciprofloxacin Vanc 125 mg PO QID Hygiene instructions   I talked to Kathy Owens and gave her results.

## 2018-11-05 NOTE — Addendum Note (Signed)
Addended by: Larey Dresser A on: 11/05/2018 01:37 PM   Modules accepted: Orders

## 2018-11-05 NOTE — Telephone Encounter (Signed)
Addendum with new [facility] orders?

## 2018-11-06 ENCOUNTER — Encounter (HOSPITAL_COMMUNITY): Payer: Self-pay | Admitting: Emergency Medicine

## 2018-11-06 ENCOUNTER — Inpatient Hospital Stay (HOSPITAL_COMMUNITY)
Admission: EM | Admit: 2018-11-06 | Discharge: 2018-11-15 | DRG: 372 | Disposition: A | Discharge status: Home / Self Care | Payer: Medicare Other | Attending: Internal Medicine | Admitting: Internal Medicine

## 2018-11-06 ENCOUNTER — Telehealth: Payer: Self-pay | Admitting: Internal Medicine

## 2018-11-06 ENCOUNTER — Other Ambulatory Visit: Payer: Self-pay

## 2018-11-06 DIAGNOSIS — I872 Venous insufficiency (chronic) (peripheral): Secondary | ICD-10-CM | POA: Diagnosis not present

## 2018-11-06 DIAGNOSIS — Z79899 Other long term (current) drug therapy: Secondary | ICD-10-CM | POA: Diagnosis not present

## 2018-11-06 DIAGNOSIS — G809 Cerebral palsy, unspecified: Secondary | ICD-10-CM | POA: Diagnosis present

## 2018-11-06 DIAGNOSIS — E860 Dehydration: Secondary | ICD-10-CM | POA: Diagnosis not present

## 2018-11-06 DIAGNOSIS — Z8619 Personal history of other infectious and parasitic diseases: Secondary | ICD-10-CM | POA: Diagnosis present

## 2018-11-06 DIAGNOSIS — L270 Generalized skin eruption due to drugs and medicaments taken internally: Secondary | ICD-10-CM | POA: Diagnosis not present

## 2018-11-06 DIAGNOSIS — Z7982 Long term (current) use of aspirin: Secondary | ICD-10-CM | POA: Diagnosis not present

## 2018-11-06 DIAGNOSIS — T50905A Adverse effect of unspecified drugs, medicaments and biological substances, initial encounter: Secondary | ICD-10-CM | POA: Diagnosis not present

## 2018-11-06 DIAGNOSIS — Z885 Allergy status to narcotic agent status: Secondary | ICD-10-CM | POA: Diagnosis not present

## 2018-11-06 DIAGNOSIS — Y92230 Patient room in hospital as the place of occurrence of the external cause: Secondary | ICD-10-CM | POA: Diagnosis not present

## 2018-11-06 DIAGNOSIS — M1990 Unspecified osteoarthritis, unspecified site: Secondary | ICD-10-CM | POA: Diagnosis not present

## 2018-11-06 DIAGNOSIS — E785 Hyperlipidemia, unspecified: Secondary | ICD-10-CM | POA: Diagnosis present

## 2018-11-06 DIAGNOSIS — I129 Hypertensive chronic kidney disease with stage 1 through stage 4 chronic kidney disease, or unspecified chronic kidney disease: Secondary | ICD-10-CM | POA: Diagnosis present

## 2018-11-06 DIAGNOSIS — N179 Acute kidney failure, unspecified: Secondary | ICD-10-CM | POA: Diagnosis present

## 2018-11-06 DIAGNOSIS — I10 Essential (primary) hypertension: Secondary | ICD-10-CM | POA: Diagnosis not present

## 2018-11-06 DIAGNOSIS — Z96641 Presence of right artificial hip joint: Secondary | ICD-10-CM | POA: Diagnosis present

## 2018-11-06 DIAGNOSIS — N183 Chronic kidney disease, stage 3 unspecified: Secondary | ICD-10-CM | POA: Diagnosis present

## 2018-11-06 DIAGNOSIS — Z881 Allergy status to other antibiotic agents status: Secondary | ICD-10-CM | POA: Diagnosis not present

## 2018-11-06 DIAGNOSIS — Z791 Long term (current) use of non-steroidal anti-inflammatories (NSAID): Secondary | ICD-10-CM | POA: Diagnosis not present

## 2018-11-06 DIAGNOSIS — K219 Gastro-esophageal reflux disease without esophagitis: Secondary | ICD-10-CM | POA: Diagnosis not present

## 2018-11-06 DIAGNOSIS — L509 Urticaria, unspecified: Secondary | ICD-10-CM | POA: Diagnosis not present

## 2018-11-06 DIAGNOSIS — M810 Age-related osteoporosis without current pathological fracture: Secondary | ICD-10-CM | POA: Diagnosis not present

## 2018-11-06 DIAGNOSIS — Z7983 Long term (current) use of bisphosphonates: Secondary | ICD-10-CM

## 2018-11-06 DIAGNOSIS — L309 Dermatitis, unspecified: Secondary | ICD-10-CM | POA: Diagnosis present

## 2018-11-06 DIAGNOSIS — E876 Hypokalemia: Secondary | ICD-10-CM | POA: Diagnosis present

## 2018-11-06 DIAGNOSIS — T380X5A Adverse effect of glucocorticoids and synthetic analogues, initial encounter: Secondary | ICD-10-CM | POA: Diagnosis not present

## 2018-11-06 DIAGNOSIS — R159 Full incontinence of feces: Secondary | ICD-10-CM | POA: Diagnosis present

## 2018-11-06 DIAGNOSIS — A0472 Enterocolitis due to Clostridium difficile, not specified as recurrent: Principal | ICD-10-CM | POA: Diagnosis present

## 2018-11-06 DIAGNOSIS — T368X5A Adverse effect of other systemic antibiotics, initial encounter: Secondary | ICD-10-CM | POA: Diagnosis present

## 2018-11-06 LAB — CBC WITH DIFFERENTIAL/PLATELET
Abs Immature Granulocytes: 0.19 10*3/uL — ABNORMAL HIGH (ref 0.00–0.07)
BASOS ABS: 0 10*3/uL (ref 0.0–0.1)
Basophils Relative: 0 %
EOS PCT: 1 %
Eosinophils Absolute: 0.3 10*3/uL (ref 0.0–0.5)
HCT: 37.9 % (ref 36.0–46.0)
Hemoglobin: 12.1 g/dL (ref 12.0–15.0)
Immature Granulocytes: 1 %
Lymphocytes Relative: 7 %
Lymphs Abs: 1.4 10*3/uL (ref 0.7–4.0)
MCH: 28.2 pg (ref 26.0–34.0)
MCHC: 31.9 g/dL (ref 30.0–36.0)
MCV: 88.3 fL (ref 80.0–100.0)
Monocytes Absolute: 0.8 10*3/uL (ref 0.1–1.0)
Monocytes Relative: 4 %
NRBC: 0 % (ref 0.0–0.2)
Neutro Abs: 18.3 10*3/uL — ABNORMAL HIGH (ref 1.7–7.7)
Neutrophils Relative %: 87 %
Platelets: 282 10*3/uL (ref 150–400)
RBC: 4.29 MIL/uL (ref 3.87–5.11)
RDW: 14.1 % (ref 11.5–15.5)
WBC: 21 10*3/uL — ABNORMAL HIGH (ref 4.0–10.5)

## 2018-11-06 LAB — COMPREHENSIVE METABOLIC PANEL
ALT: 39 U/L (ref 0–44)
AST: 35 U/L (ref 15–41)
Albumin: 3.3 g/dL — ABNORMAL LOW (ref 3.5–5.0)
Alkaline Phosphatase: 57 U/L (ref 38–126)
Anion gap: 10 (ref 5–15)
BUN: 15 mg/dL (ref 8–23)
CO2: 25 mmol/L (ref 22–32)
CREATININE: 1.34 mg/dL — AB (ref 0.44–1.00)
Calcium: 8.4 mg/dL — ABNORMAL LOW (ref 8.9–10.3)
Chloride: 100 mmol/L (ref 98–111)
GFR calc non Af Amer: 42 mL/min — ABNORMAL LOW (ref >60)
GFR, EST AFRICAN AMERICAN: 49 mL/min — AB (ref >60)
Glucose, Bld: 102 mg/dL — ABNORMAL HIGH (ref 70–99)
Potassium: 3 mmol/L — ABNORMAL LOW (ref 3.5–5.1)
Sodium: 135 mmol/L (ref 135–145)
Total Bilirubin: 0.5 mg/dL (ref 0.3–1.2)
Total Protein: 5.7 g/dL — ABNORMAL LOW (ref 6.5–8.1)

## 2018-11-06 LAB — I-STAT CG4 LACTIC ACID, ED: Lactic Acid, Venous: 1.03 mmol/L (ref 0.5–1.9)

## 2018-11-06 MED ORDER — HYDROXYZINE HCL 10 MG PO TABS
10.0000 mg | ORAL_TABLET | Freq: Once | ORAL | Status: AC
  Administered 2018-11-06: 10 mg via ORAL
  Filled 2018-11-06: qty 1

## 2018-11-06 MED ORDER — SENNOSIDES-DOCUSATE SODIUM 8.6-50 MG PO TABS: 1.0000 | ORAL_TABLET | Freq: Every evening | ORAL | Status: DC | PRN

## 2018-11-06 MED ORDER — SODIUM CHLORIDE 0.9 % IV BOLUS
500.0000 mL | Freq: Once | INTRAVENOUS | Status: AC
  Administered 2018-11-06: 500 mL via INTRAVENOUS

## 2018-11-06 MED ORDER — POTASSIUM CHLORIDE 10 MEQ/100ML IV SOLN
10.0000 meq | INTRAVENOUS | Status: AC
  Administered 2018-11-06: 10 meq via INTRAVENOUS
  Filled 2018-11-06 (×2): qty 100

## 2018-11-06 MED ORDER — ENOXAPARIN SODIUM 40 MG/0.4ML ~~LOC~~ SOLN
40.0000 mg | Freq: Every day | SUBCUTANEOUS | Status: DC
  Administered 2018-11-07 – 2018-11-15 (×9): 40 mg via SUBCUTANEOUS
  Filled 2018-11-06 (×9): qty 0.4

## 2018-11-06 MED ORDER — PROMETHAZINE HCL 25 MG PO TABS: 12.5000 mg | ORAL_TABLET | Freq: Four times a day (QID) | ORAL | Status: DC | PRN

## 2018-11-06 MED ORDER — ATORVASTATIN CALCIUM 10 MG PO TABS
10.0000 mg | ORAL_TABLET | Freq: Every day | ORAL | Status: DC
  Administered 2018-11-07 – 2018-11-14 (×8): 10 mg via ORAL
  Filled 2018-11-06 (×9): qty 1

## 2018-11-06 MED ORDER — ACETAMINOPHEN 650 MG RE SUPP: 650.0000 mg | Freq: Four times a day (QID) | RECTAL | Status: DC | PRN

## 2018-11-06 MED ORDER — ASPIRIN EC 81 MG PO TBEC
81.0000 mg | DELAYED_RELEASE_TABLET | Freq: Every day | ORAL | Status: DC
  Administered 2018-11-07 – 2018-11-15 (×9): 81 mg via ORAL
  Filled 2018-11-06 (×9): qty 1

## 2018-11-06 MED ORDER — FIDAXOMICIN 200 MG PO TABS
200.0000 mg | ORAL_TABLET | Freq: Two times a day (BID) | ORAL | Status: DC
  Administered 2018-11-07 – 2018-11-15 (×18): 200 mg via ORAL
  Filled 2018-11-06 (×20): qty 1

## 2018-11-06 MED ORDER — FIDAXOMICIN 200 MG PO TABS
200.0000 mg | ORAL_TABLET | Freq: Two times a day (BID) | ORAL | Status: DC
  Filled 2018-11-06: qty 1

## 2018-11-06 MED ORDER — ACETAMINOPHEN 325 MG PO TABS
650.0000 mg | ORAL_TABLET | Freq: Four times a day (QID) | ORAL | Status: DC | PRN
  Administered 2018-11-07 – 2018-11-14 (×10): 650 mg via ORAL
  Filled 2018-11-06 (×10): qty 2

## 2018-11-06 MED ORDER — POTASSIUM CHLORIDE CRYS ER 20 MEQ PO TBCR
40.0000 meq | EXTENDED_RELEASE_TABLET | Freq: Once | ORAL | Status: AC
  Administered 2018-11-06: 40 meq via ORAL
  Filled 2018-11-06: qty 2
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
## 2018-11-06 NOTE — ED Notes (Signed)
Admitting at bedside- will transport after assessment

## 2018-11-06 NOTE — ED Triage Notes (Signed)
Pt coming by EMS with rash all over body after taking 3 doses of vancomycin for recently diagnosed c diff. No difficulty breathing of swallowing. Alert and oriented with airway secure
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
## 2018-11-06 NOTE — Telephone Encounter (Signed)
Received a call from the patient that she was started on vancomycin for clostridium difficilis. Her diarrhea has improved but since starting the medications she has noticed red spots all over her body. These spots due itch. She denies difficulty breathing, lightheadedness, changes in vision, diaphoresis. Discussed that this could be due to the vancomycin and that she should be evaluated in an emergency department immediately.

## 2018-11-07 DIAGNOSIS — Z872 Personal history of diseases of the skin and subcutaneous tissue: Secondary | ICD-10-CM

## 2018-11-07 DIAGNOSIS — T368X5A Adverse effect of other systemic antibiotics, initial encounter: Secondary | ICD-10-CM

## 2018-11-07 DIAGNOSIS — E876 Hypokalemia: Secondary | ICD-10-CM

## 2018-11-07 DIAGNOSIS — Z885 Allergy status to narcotic agent status: Secondary | ICD-10-CM

## 2018-11-07 DIAGNOSIS — I872 Venous insufficiency (chronic) (peripheral): Secondary | ICD-10-CM

## 2018-11-07 DIAGNOSIS — G809 Cerebral palsy, unspecified: Secondary | ICD-10-CM

## 2018-11-07 DIAGNOSIS — Z79899 Other long term (current) drug therapy: Secondary | ICD-10-CM

## 2018-11-07 DIAGNOSIS — K219 Gastro-esophageal reflux disease without esophagitis: Secondary | ICD-10-CM

## 2018-11-07 DIAGNOSIS — A0472 Enterocolitis due to Clostridium difficile, not specified as recurrent: Principal | ICD-10-CM

## 2018-11-07 DIAGNOSIS — N179 Acute kidney failure, unspecified: Secondary | ICD-10-CM

## 2018-11-07 DIAGNOSIS — L270 Generalized skin eruption due to drugs and medicaments taken internally: Secondary | ICD-10-CM

## 2018-11-07 DIAGNOSIS — N183 Chronic kidney disease, stage 3 (moderate): Secondary | ICD-10-CM

## 2018-11-07 DIAGNOSIS — Z7983 Long term (current) use of bisphosphonates: Secondary | ICD-10-CM

## 2018-11-07 DIAGNOSIS — Z7982 Long term (current) use of aspirin: Secondary | ICD-10-CM

## 2018-11-07 DIAGNOSIS — Z881 Allergy status to other antibiotic agents status: Secondary | ICD-10-CM

## 2018-11-07 DIAGNOSIS — I129 Hypertensive chronic kidney disease with stage 1 through stage 4 chronic kidney disease, or unspecified chronic kidney disease: Secondary | ICD-10-CM

## 2018-11-07 DIAGNOSIS — M1990 Unspecified osteoarthritis, unspecified site: Secondary | ICD-10-CM

## 2018-11-07 DIAGNOSIS — E785 Hyperlipidemia, unspecified: Secondary | ICD-10-CM

## 2018-11-07 LAB — BASIC METABOLIC PANEL
Anion gap: 12 (ref 5–15)
BUN: 14 mg/dL (ref 8–23)
CO2: 23 mmol/L (ref 22–32)
Calcium: 8.2 mg/dL — ABNORMAL LOW (ref 8.9–10.3)
Chloride: 105 mmol/L (ref 98–111)
Creatinine, Ser: 1.34 mg/dL — ABNORMAL HIGH (ref 0.44–1.00)
GFR calc Af Amer: 49 mL/min — ABNORMAL LOW (ref >60)
GFR, EST NON AFRICAN AMERICAN: 42 mL/min — AB (ref >60)
Glucose, Bld: 162 mg/dL — ABNORMAL HIGH (ref 70–99)
Potassium: 3.2 mmol/L — ABNORMAL LOW (ref 3.5–5.1)
Sodium: 140 mmol/L (ref 135–145)

## 2018-11-07 LAB — CBC
HCT: 34.1 % — ABNORMAL LOW (ref 36.0–46.0)
HEMOGLOBIN: 11.2 g/dL — AB (ref 12.0–15.0)
MCH: 28.3 pg (ref 26.0–34.0)
MCHC: 32.8 g/dL (ref 30.0–36.0)
MCV: 86.1 fL (ref 80.0–100.0)
Platelets: 262 10*3/uL (ref 150–400)
RBC: 3.96 MIL/uL (ref 3.87–5.11)
RDW: 13.9 % (ref 11.5–15.5)
WBC: 18.6 10*3/uL — ABNORMAL HIGH (ref 4.0–10.5)
nRBC: 0 % (ref 0.0–0.2)

## 2018-11-07 LAB — HIV ANTIBODY (ROUTINE TESTING W REFLEX): HIV Screen 4th Generation wRfx: NONREACTIVE

## 2018-11-07 MED ORDER — CALAMINE EX LOTN
TOPICAL_LOTION | Freq: Two times a day (BID) | CUTANEOUS | Status: DC
  Administered 2018-11-07 – 2018-11-10 (×9): via TOPICAL
  Administered 2018-11-11: 1 via TOPICAL
  Administered 2018-11-11 – 2018-11-15 (×8): via TOPICAL
  Filled 2018-11-07: qty 177

## 2018-11-07 MED ORDER — POTASSIUM CHLORIDE CRYS ER 20 MEQ PO TBCR
40.0000 meq | EXTENDED_RELEASE_TABLET | Freq: Once | ORAL | Status: AC
  Administered 2018-11-07: 40 meq via ORAL
  Filled 2018-11-07: qty 2

## 2018-11-07 MED ORDER — LACTATED RINGERS IV SOLN
INTRAVENOUS | Status: AC
  Administered 2018-11-07 (×2): via INTRAVENOUS

## 2018-11-07 MED ORDER — POTASSIUM CHLORIDE 10 MEQ/100ML IV SOLN
10.0000 meq | INTRAVENOUS | Status: AC
  Administered 2018-11-07 (×2): 10 meq via INTRAVENOUS
  Filled 2018-11-07: qty 100

## 2018-11-07 MED ORDER — PREDNISONE 20 MG PO TABS
40.0000 mg | ORAL_TABLET | Freq: Every day | ORAL | Status: DC
  Administered 2018-11-07 – 2018-11-13 (×7): 40 mg via ORAL
  Filled 2018-11-07 (×7): qty 2

## 2018-11-07 NOTE — Progress Notes (Addendum)
   Subjective: No overnight events. Ms. Martinique thinks that her rash looks worse than yesterday. Her throat also feels a little swollen today. No BMs overnight.  Objective: Vital signs in last 24 hours: Vitals:   11/06/18 2230 11/06/18 2345 11/07/18 0429 11/07/18 0532  BP: 122/63 (!) 106/58  (!) 100/56  Pulse: 85 86  80  Resp: 17 18  18   Temp:  98.2 F (36.8 C)  98 F (36.7 C)  TempSrc:  Oral    SpO2: 98% 100%  99%  Weight:  68.7 kg 68.7 kg   Height:       Gen: laying comfortably in bed, no distress CV: RRR Skin: morbiliform rash of the trunk, back, upper extremities, and neck. Confluent on the back.  Ext: scattered papules of the lower extremities. Chronic nonpitting edema BLE.  Assessment/Plan:  Mrs.Slatten is a 63 yo F w/ PMH of HLd, HTN, CKD3a, venous insufficiency and cerebral palsy presenting with rash with pruritus. History consistent with allergic reaction to vancomycin. Vancomycin will need to be discontinued and alternative for her C.diff colitis need to be provided. She has had no improvement with flagyl. ID recommend fidaxomicin. She will be admitted for observation while we figure out a way for her to get fidaxomicin at home.  Body rash w/ pruritus 2/2 medication allergy - Rash is stable - Stop vancomycin - Prednisone 40mg  daily for dermatitis - Calamine lotion BID for itching  C.Diff colitis - Continue fidaxomicin 200mg  BID (day 2/10 - stop date 11/14/18) - Consult case management for medication assistance.  Hypokalemia 2/2 diarrhea AKI on CKD 2/2 dehydration 2/2 diarrhea - Replace potassium via oral supplementation  - Continue maintenance fluids  - Trend BMP  HTN - Holding home bp meds (triamterene-HCTZ 37.5-25mg  daily) in setting of acute volume loss with diarrhea  HLD - c/w home meds: Atorvastatin 10mg  daily, aspirin 81mg  daily  Hx of Osteoporosis - Holding alendronate since expected to be short hospital stay  Dispo: Anticipated discharge in  approximately 0-1 day(s) pending whether the fidaxomicin can be obtained.   Ina Homes, MD 11/07/2018, 9:33 AM

## 2018-11-07 NOTE — Progress Notes (Signed)
Medicine attending: I personally examined this patient today together with resident physician Dr. Ina Homes and I concur with his evaluation and management plan. Please see separate attending admission note for additional details. Stable overnight.  She cannot remember having had a bowel movement which is a good sign in view of history of C. difficile colitis.  Currently on fidaxomicin.  She remains afebrile. Rash is extensive and angry.  We will start a course of steroids for symptomatic relief. No change in renal function overnight but current creatinine 1.3 consistent with previous values. Continue current management.

## 2018-11-08 ENCOUNTER — Encounter (HOSPITAL_COMMUNITY): Payer: Self-pay

## 2018-11-08 LAB — BASIC METABOLIC PANEL
Anion gap: 11 (ref 5–15)
BUN: 20 mg/dL (ref 8–23)
CO2: 24 mmol/L (ref 22–32)
CREATININE: 1.3 mg/dL — AB (ref 0.44–1.00)
Calcium: 8.2 mg/dL — ABNORMAL LOW (ref 8.9–10.3)
Chloride: 106 mmol/L (ref 98–111)
GFR calc Af Amer: 51 mL/min — ABNORMAL LOW (ref >60)
GFR calc non Af Amer: 44 mL/min — ABNORMAL LOW (ref >60)
Glucose, Bld: 136 mg/dL — ABNORMAL HIGH (ref 70–99)
Potassium: 4.2 mmol/L (ref 3.5–5.1)
Sodium: 141 mmol/L (ref 135–145)

## 2018-11-08 MED ORDER — FIDAXOMICIN 200 MG PO TABS: 200.0000 mg | ORAL_TABLET | Freq: Two times a day (BID) | ORAL | 0 refills | Status: DC

## 2018-11-08 MED ORDER — HYDROCORTISONE 1 % EX CREA
TOPICAL_CREAM | Freq: Two times a day (BID) | CUTANEOUS | Status: DC
  Administered 2018-11-08: 1 via TOPICAL
  Administered 2018-11-08 – 2018-11-12 (×8): via TOPICAL
  Administered 2018-11-12: 1 via TOPICAL
  Administered 2018-11-13 – 2018-11-15 (×5): via TOPICAL
  Filled 2018-11-08: qty 28

## 2018-11-08 MED ORDER — HYDROCORTISONE 1 % EX CREA: TOPICAL_CREAM | Freq: Three times a day (TID) | CUTANEOUS | Status: DC

## 2018-11-08 MED ORDER — POTASSIUM CHLORIDE CRYS ER 20 MEQ PO TBCR
40.0000 meq | EXTENDED_RELEASE_TABLET | Freq: Once | ORAL | Status: AC
  Administered 2018-11-08: 40 meq via ORAL
  Filled 2018-11-08: qty 2

## 2018-11-08 NOTE — Care Management Note (Addendum)
Case Management Note  Patient Details  Name: Enda K Martinique MRN: [id number] Date of Birth: 1955/12/18  Subjective/Objective:                    Action/Plan:See Benefit check results. [facility] in [facility] is going to reach out to MD    Will price 12 doses ( 6 days)  Consult : C.Diff colitis - Continue fidaxomicin 200mg  BID(day 2/10 - stop date 11/14/18)  Will need to know how many doses to price. Texted MD   Expected Discharge Date:                  Expected Discharge Plan:  Home/Self Care  In-House Referral:     Discharge planning Services  CM Consult, Medication Assistance  Post Acute Care Choice:  NA Choice offered to:     DME Arranged:    DME Agency:     HH Arranged:    HH Agency:     Status of Service:  In process, will continue to follow  If discussed at Long Length of Stay Meetings, dates discussed:    Additional Comments:  Marilu Favre, RN 11/08/2018, 10:27 AM

## 2018-11-08 NOTE — Progress Notes (Signed)
Medicine attending: Clinical status and database reviewed with resident physician Dr Harlow Ohms and I concur with her evaluation and management plan. Persistent diffuse erythematous rash secondary to allergic reaction to vancomycin used to treat C. difficile colitis contracted due to clindamycin prescribed for initial cellulitis.  Currently on Fidaxomycin.  No stools recorded since admission. She remains afebrile. Leukocytosis related to colitis acute inflammatory response and current steroid use. Continue current management plan.

## 2018-11-08 NOTE — Progress Notes (Signed)
   Subjective: Patient reports she did not sleep well. She was very uncomfortable 2/2 itchiness and burning of her rash. She reports her itchiness has improved but she continues to have some burning intermittently. She is having burning with urination. She is not sure if she is still having diarrhea because no one has informed her.   Objective:  Vital signs in last 24 hours: Vitals:   11/07/18 0532 11/07/18 1426 11/07/18 2135 11/08/18 0503  BP: (!) 100/56 104/71 123/60 124/61  Pulse: 80 (!) 53 90 82  Resp: 18  16 18   Temp: 98 F (36.7 C) 98.7 F (37.1 C) 98.2 F (36.8 C) 97.9 F (36.6 C)  TempSrc:  Oral    SpO2: 99% 97% 97% 97%  Weight:      Height:       General: laying comfortably in bed, no acute distress CV: RRR, no murmurs Skin: morbiliform rash of the trunk, back, upper extremities, and neck. Confluent on the stomach and back Ext: scattered papules of the lower extremities. Chronic nonpitting edema BLE  Assessment/Plan:  Principal Problem:   Drug reaction Active Problems:   Chronic renal insufficiency, stage 3 (moderate) (HCC)   C. difficile colitis   Hypokalemia  Kathy Owens is a 63 yo F w/ PMH of HLd, HTN, CKD3a, venous insufficiencyand cerebral palsy presenting with rash with pruritus. History consistent with allergic reaction to vancomycin. Vancomycin will need to be discontinued and alternative for her C.diff colitis need to be provided. She has had no improvement with flagyl. ID recommend fidaxomicin. She will be admitted for observation while we figure out a way for her to get fidaxomicin at home.  Body rash w/ pruritus 2/2 medication allergy - Rash is stable - Prednisone 40mg  daily for dermatitis - Calamine lotion BIDfor itching  C.Diff colitis - Continue fidaxomicin 200mg  BID(day 3/10 - stop date 11/14/18) - Consult case management for medication assistance.  Hypokalemia 2/2 diarrhea AKI on CKD 2/2 dehydration 2/2 diarrhea - K 4.2 - Cr 1.3 - Trend  BMP  HTN - Holding home bp meds(triamterene-HCTZ 37.5-25mg  daily)in setting of acute volume loss with diarrhea  HLD - c/w home meds: Atorvastatin 10mg  daily, aspirin 81mg  daily  Hx of Osteoporosis - Holding alendronate since expected to be short hospital stay   Dispo: Anticipated discharge pending clinical improvement of rash and whether the fidaxomicin can be obtained.    Mike Craze, DO 11/08/2018, 6:47 AM Pager: [phone]

## 2018-11-08 NOTE — Care Management (Signed)
#    2.   S/W  VICTORIA  @ [facility] # [phone]   BRAND : 1. DIFICID   200 MG BID X 12 DOSES COVER- YES CO-PAY-  25 % OF TOTAL COST TIER- 5 DRUG PRIOR APPROVAL- NO  GENERIC :  2. FIDAXOMICIN  200 MG BID X  12 DOSES  NONE FORMULARY   PREFERRED PHARMACY : YES [facility] AND [facility]  PATIENT SECONDARY INS : MEDICAID  OF [place] EFF-DATE: 10/20/2018 CO-PAY- $ 3.90 FOR EACH PRESCRIPTION

## 2018-11-09 ENCOUNTER — Encounter (HOSPITAL_COMMUNITY): Payer: Self-pay | Admitting: Surgery

## 2018-11-09 LAB — BASIC METABOLIC PANEL
ANION GAP: 9 (ref 5–15)
BUN: 27 mg/dL — ABNORMAL HIGH (ref 8–23)
CALCIUM: 8.6 mg/dL — AB (ref 8.9–10.3)
CO2: 22 mmol/L (ref 22–32)
Chloride: 109 mmol/L (ref 98–111)
Creatinine, Ser: 1.14 mg/dL — ABNORMAL HIGH (ref 0.44–1.00)
GFR calc non Af Amer: 51 mL/min — ABNORMAL LOW (ref >60)
GFR, EST AFRICAN AMERICAN: 59 mL/min — AB (ref >60)
Glucose, Bld: 114 mg/dL — ABNORMAL HIGH (ref 70–99)
Potassium: 4.2 mmol/L (ref 3.5–5.1)
Sodium: 140 mmol/L (ref 135–145)

## 2018-11-09 MED ORDER — GERHARDT'S BUTT CREAM
TOPICAL_CREAM | CUTANEOUS | Status: DC | PRN
  Administered 2018-11-10: 10:00:00 via TOPICAL
  Filled 2018-11-09: qty 1

## 2018-11-09 MED ORDER — LACTATED RINGERS IV SOLN
INTRAVENOUS | Status: AC
  Administered 2018-11-09: 12:00:00 via INTRAVENOUS

## 2018-11-09 MED ORDER — LACTATED RINGERS IV SOLN: INTRAVENOUS | Status: DC

## 2018-11-09 NOTE — Care Management Note (Signed)
Case Management Note  Patient Details  Name: Kathy Owens MRN: [id number] Date of Birth: 1956-06-26  Subjective/Objective:                    Action/Plan:  Nicole Kindred in [place] ran prescription for Dificid and patient cost is just under $4.00 Expected Discharge Date:                  Expected Discharge Plan:  Home/Self Care  In-House Referral:     Discharge planning Services  CM Consult, Medication Assistance  Post Acute Care Choice:  NA Choice offered to:     DME Arranged:    DME Agency:     HH Arranged:    HH Agency:     Status of Service:  In process, will continue to follow  If discussed at Long Length of Stay Meetings, dates discussed:    Additional Comments:  Marilu Favre, RN 11/09/2018, 12:19 PM

## 2018-11-09 NOTE — Progress Notes (Signed)
   Subjective: No overnight events. Patient reports she feels her rash is continuing to improve. She was concerned about her diarrhea. It was explained this should resolve with antibiotic therapy. She was also concerned about her kidney function but informed her kidney function is stable.   Objective:  Vital signs in last 24 hours: Vitals:   11/08/18 0503 11/08/18 1358 11/08/18 2040 11/09/18 0615  BP: 124/61 134/62 124/60 131/66  Pulse: 82 82 85 80  Resp: 18 18 17    Temp: 97.9 F (36.6 C) 97.7 F (36.5 C) 97.6 F (36.4 C) 97.9 F (36.6 C)  TempSrc:  Oral Oral Oral  SpO2: 97% 98% 94% 97%  Weight:      Height:       Gen: comfortably laying in bed, no distress Skin: diffuse erythematous rash, confluent on abdomen and back extending to thighs, LE with scattered papules, chronic non-pitting edema  Assessment/Plan:  Principal Problem:   Drug reaction Active Problems:   Chronic renal insufficiency, stage 3 (moderate) (HCC)   C. difficile colitis   Hypokalemia  [name] is a 63 yo F w/ PMH of HLd, HTN, CKD3a, venous insufficiencyand cerebral palsy presenting with rash with pruritus. History consistent with allergic reaction to vancomycin. Vancomycin will need to be discontinued and alternative for her C.diff colitis need to be provided. She has had no improvement with flagyl. ID recommend fidaxomicin. She will be admitted for observation while we figure out a way for her to get fidaxomicin at home.  Body rash w/ pruritus 2/2 medication allergy -Rash is stable - Prednisone40mg  daily for dermatitis - Calamine lotion BIDfor itching - Hydrocortisone cream 1% BID   C.Diff colitis -Continuefidaxomicin 200mg  BID(day4/10 - stop date 11/14/18) -Consult case management for medication assistance - RN reported numerous loose stools yesterday, ordered flexi seal  Hypokalemia 2/2 diarrhea AKI on CKD 2/2 dehydration 2/2 diarrhea - K 4.2 - Cr 1.14 - Trend BMP - Gentle  hydration with 75 ml/hr LR for 10 hours  HTN - Holding home bp meds(triamterene-HCTZ 37.5-25mg  daily)in setting of acute volume loss with diarrhea  HLD - c/w home meds: Atorvastatin 10mg  daily, aspirin 81mg  daily  Hx of Osteoporosis - Holding alendronate since expected to be short hospital stay   Dispo: Anticipated discharge approximately 1-2 days.  Mike Craze, DO 11/09/2018, 6:52 AM Pager: [phone]

## 2018-11-09 NOTE — Progress Notes (Signed)
  Date: 11/09/2018  Patient name: Kathy Owens  Medical record number: [id number]  Date of birth: 01-16-1956   I have seen and evaluated this patient and I have discussed the plan of care with the house staff. Please see their note for complete details. I concur with their findings with the following additions/corrections:   63 year old woman admitted with C. difficile infection and vancomycin reaction.  Doing well so far on fidaxomicin, however, hoping to see some improved symptoms.  At this point, her frequency of diarrhea is too much to be able to manage at home as she would become dehydrated without IV fluids and has trouble caring for her self given the frequency.  We will continue inpatient management until the diarrhea slows to a manageable level and we can stop the IV fluids.  Lenice Pressman, M.D., Ph.D. 11/09/2018, 2:24 PM

## 2018-11-09 NOTE — Progress Notes (Signed)
Placed patients rectal tube again during night shift. Early this am patients rectal tube dislodged for a second time. Patient stated that she doesn't want it back in.

## 2018-11-10 DIAGNOSIS — L270 Generalized skin eruption due to drugs and medicaments taken internally: Secondary | ICD-10-CM | POA: Diagnosis not present

## 2018-11-10 DIAGNOSIS — T368X5A Adverse effect of other systemic antibiotics, initial encounter: Secondary | ICD-10-CM | POA: Diagnosis not present

## 2018-11-10 DIAGNOSIS — R159 Full incontinence of feces: Secondary | ICD-10-CM | POA: Diagnosis present

## 2018-11-10 DIAGNOSIS — Z881 Allergy status to other antibiotic agents status: Secondary | ICD-10-CM | POA: Diagnosis not present

## 2018-11-10 DIAGNOSIS — T380X5A Adverse effect of glucocorticoids and synthetic analogues, initial encounter: Secondary | ICD-10-CM | POA: Diagnosis not present

## 2018-11-10 DIAGNOSIS — Z7982 Long term (current) use of aspirin: Secondary | ICD-10-CM | POA: Diagnosis not present

## 2018-11-10 DIAGNOSIS — Z791 Long term (current) use of non-steroidal anti-inflammatories (NSAID): Secondary | ICD-10-CM | POA: Diagnosis not present

## 2018-11-10 DIAGNOSIS — M2550 Pain in unspecified joint: Secondary | ICD-10-CM | POA: Diagnosis not present

## 2018-11-10 DIAGNOSIS — Z79899 Other long term (current) drug therapy: Secondary | ICD-10-CM | POA: Diagnosis not present

## 2018-11-10 DIAGNOSIS — L309 Dermatitis, unspecified: Secondary | ICD-10-CM | POA: Diagnosis present

## 2018-11-10 DIAGNOSIS — A0472 Enterocolitis due to Clostridium difficile, not specified as recurrent: Secondary | ICD-10-CM | POA: Diagnosis not present

## 2018-11-10 DIAGNOSIS — E860 Dehydration: Secondary | ICD-10-CM | POA: Diagnosis present

## 2018-11-10 DIAGNOSIS — I872 Venous insufficiency (chronic) (peripheral): Secondary | ICD-10-CM | POA: Diagnosis present

## 2018-11-10 DIAGNOSIS — E785 Hyperlipidemia, unspecified: Secondary | ICD-10-CM | POA: Diagnosis present

## 2018-11-10 DIAGNOSIS — Y92230 Patient room in hospital as the place of occurrence of the external cause: Secondary | ICD-10-CM | POA: Diagnosis not present

## 2018-11-10 DIAGNOSIS — Z7983 Long term (current) use of bisphosphonates: Secondary | ICD-10-CM | POA: Diagnosis not present

## 2018-11-10 DIAGNOSIS — I129 Hypertensive chronic kidney disease with stage 1 through stage 4 chronic kidney disease, or unspecified chronic kidney disease: Secondary | ICD-10-CM | POA: Diagnosis not present

## 2018-11-10 DIAGNOSIS — Z885 Allergy status to narcotic agent status: Secondary | ICD-10-CM | POA: Diagnosis not present

## 2018-11-10 DIAGNOSIS — N183 Chronic kidney disease, stage 3 (moderate): Secondary | ICD-10-CM | POA: Diagnosis not present

## 2018-11-10 DIAGNOSIS — Z7401 Bed confinement status: Secondary | ICD-10-CM | POA: Diagnosis not present

## 2018-11-10 DIAGNOSIS — G809 Cerebral palsy, unspecified: Secondary | ICD-10-CM | POA: Diagnosis present

## 2018-11-10 DIAGNOSIS — Z96641 Presence of right artificial hip joint: Secondary | ICD-10-CM | POA: Diagnosis present

## 2018-11-10 DIAGNOSIS — N179 Acute kidney failure, unspecified: Secondary | ICD-10-CM | POA: Diagnosis present

## 2018-11-10 DIAGNOSIS — E876 Hypokalemia: Secondary | ICD-10-CM | POA: Diagnosis present

## 2018-11-10 DIAGNOSIS — M810 Age-related osteoporosis without current pathological fracture: Secondary | ICD-10-CM | POA: Diagnosis present

## 2018-11-10 LAB — BASIC METABOLIC PANEL
ANION GAP: 7 (ref 5–15)
BUN: 26 mg/dL — ABNORMAL HIGH (ref 8–23)
CO2: 24 mmol/L (ref 22–32)
Calcium: 8.5 mg/dL — ABNORMAL LOW (ref 8.9–10.3)
Chloride: 109 mmol/L (ref 98–111)
Creatinine, Ser: 1.05 mg/dL — ABNORMAL HIGH (ref 0.44–1.00)
GFR calc Af Amer: >60 mL/min (ref >60)
GFR calc non Af Amer: 56 mL/min — ABNORMAL LOW (ref >60)
Glucose, Bld: 120 mg/dL — ABNORMAL HIGH (ref 70–99)
Potassium: 4.6 mmol/L (ref 3.5–5.1)
Sodium: 140 mmol/L (ref 135–145)

## 2018-11-10 MED ORDER — LACTATED RINGERS IV SOLN
INTRAVENOUS | Status: DC
  Administered 2018-11-10 – 2018-11-13 (×4): via INTRAVENOUS

## 2018-11-10 NOTE — Progress Notes (Signed)
   Subjective: No overnight events. Ms. Martinique reports that she feels much better today. However, she has continued to have diarrhea throughout the day yesterday. She didn't have many stools overnight or this morning. Her rash continues to be itchy, particularly at night.  Objective:  Vital signs in last 24 hours: Vitals:   11/08/18 2040 11/09/18 0615 11/09/18 1400 11/09/18 2115  BP: 124/60 131/66 (!) 146/81 (!) 149/81  Pulse: 85 80 82 84  Resp: 17  20 18   Temp: 97.6 F (36.4 C) 97.9 F (36.6 C) 98.8 F (37.1 C) 98.2 F (36.8 C)  TempSrc: Oral Oral Oral Oral  SpO2: 94% 97% 98% 100%  Weight:      Height:       Gen: laying comfortably in bed, no distress Abd: bowel sounds present, soft, non-distended, non-tender Skin: confluent, faint erythema over the arms and thighs Ext: No edema  Assessment/Plan:  Principal Problem:   C. difficile colitis Active Problems:   Chronic renal insufficiency, stage 3 (moderate) (HCC)   Drug reaction   Hypokalemia  Mrs.Martinique is a 63 yo F w/ PMH of HLd, HTN, CKD3a, venous insufficiencyand cerebral palsy presenting with rash with pruritus. History consistent with allergic reaction to vancomycin. Vancomycin will need to be discontinued and alternative for her C.diff colitis need to be provided. She has had no improvement with flagyl. ID recommend fidaxomicin. She will be admitted for observation while we figure out a way for her to get fidaxomicin at home.  Body rash w/ pruritus 2/2 medication allergy -Rash is improving - Prednisone40mg  daily for dermatitis - Calamine lotion BIDfor itching - Hydrocortisone cream 1% BID   C.Diff colitis -Continuefidaxomicin 200mg  BID(day5/10 - stop date 11/14/18) - Patient reports she had diarrhea yesterday but has not had any more episodes overnight   Hypokalemia 2/2 diarrhea AKI on CKD 2/2 dehydration 2/2 diarrhea -K 4.6, resolved - Cr 1.05, stable - Gentle hydration with 75 ml/hr LR   HTN -  Holding home bp meds(triamterene-HCTZ 37.5-25mg  daily)in setting of acute volume loss with diarrhea  HLD - c/w home meds: Atorvastatin 10mg  daily, aspirin 81mg  daily    Dispo: Anticipated discharge pending clinical improvement. Once patient has decreased diarrhea she will be stable for discharge.  Mike Craze, DO 11/10/2018, 6:56 AM Pager: [phone]

## 2018-11-10 NOTE — Care Management Note (Addendum)
Case Management Note  Patient Details  Name: Kathy Owens MRN: [id number] Date of Birth: 14-Jan-1956  Subjective/Objective:                    Action/Plan:  Spoke to patient at bedside. Explained TOC pharmacy has Dificid prescription and co pay is $4. Patient has debit card for co pay.   At discharge patient will need ambulance transport home. Patient lives alone. Confirmed face sheet information. Patient has apartment keys. Patient has walker, 3 in 1 , and shower chair.  Expected Discharge Date:                  Expected Discharge Plan:  Home/Self Care  In-House Referral:  NA  Discharge planning Services  CM Consult, Medication Assistance  Post Acute Care Choice:  NA Choice offered to:  Patient  DME Arranged:  N/A DME Agency:  NA  HH Arranged:  NA HH Agency:  NA  Status of Service:  In process, will continue to follow  If discussed at Long Length of Stay Meetings, dates discussed:    Additional Comments:  Marilu Favre, RN 11/10/2018, 10:14 AM

## 2018-11-10 NOTE — Progress Notes (Signed)
  Date: 11/10/2018  Patient name: Kathy Owens  Medical record number: [id number]  Date of birth: 1956-06-01   I have seen and evaluated this patient and I have discussed the plan of care with the house staff. Please see their note for complete details. I concur with their findings with the following additions/corrections:   Stool frequency seems to be decreasing. Had a "bad day" yesterday, but overnight no diarrhea. If she does well today, possible discharge tomorrow. Continue IV fluids for now to maintain hydration in CKD until diarrhea has resolved. Rash slowly improving.   Lenice Pressman, M.D., Ph.D. 11/10/2018, 4:36 PM

## 2018-11-11 NOTE — Progress Notes (Signed)
   Subjective: No overnight events. Kathy Owens reports that her diarrhea has slowed down, but she had an episode of bowel incontinence overnight. She also feels weak today. She's only been out of bed once yesterday. She still feels itchy and her bottom is still "mad."  Objective:  Vital signs in last 24 hours: Vitals:   11/10/18 1356 11/10/18 2100 11/11/18 0500 11/11/18 0532  BP: 139/65 129/67  (!) 158/79  Pulse: 82 82  78  Resp: 20 20    Temp: 98.6 F (37 C) 98.2 F (36.8 C)  98.2 F (36.8 C)  TempSrc: Oral Oral  Oral  SpO2: 99% 98%  98%  Weight:   73.1 kg   Height:       Gen: sitting up comfortably in bed eating breakfast, no distress CV: RRR, no murmurs Pulm: ctab, normal effort Skin: Faint erythema of the arms and chest covered in calamine lotion  Assessment/Plan:  Principal Problem:   C. difficile colitis Active Problems:   Chronic renal insufficiency, stage 3 (moderate) (HCC)   Drug reaction   Hypokalemia  Kathy Owens is a 63 yo F w/ PMH of HLd, HTN, CKD3a, venous insufficiencyand cerebral palsy presenting with rash with pruritus. History consistent with allergic reaction to vancomycin. Vancomycin will need to be discontinued and alternative for her C.diff colitis need to be provided. She has had no improvement with flagyl. ID recommend fidaxomicin. She will be admitted for observation while we figure out a way for her to get fidaxomicin at home.  Body rash w/ pruritus 2/2 medication allergy -Rash is continuing to improve - Prednisone40mg  daily for dermatitis, will taper at discharge - Calamine lotion BIDfor itching - Hydrocortisone cream 1% BID  C.Diff colitis -Continuefidaxomicin 200mg  BID(day6/10 - stop date 11/14/18) - Patient reports diarrhea has slowed down, but she still feels weak - Gentle hydration with 75 ml/hr LR  - Ambulate patient with nursing  HTN - Holding home bp meds(triamterene-HCTZ 37.5-25mg  daily)in setting of acute volume loss  with diarrhea   Dispo: Anticipated discharge is tomorrow.  Mike Craze, DO 11/11/2018, 6:53 AM Pager: [phone]

## 2018-11-11 NOTE — Progress Notes (Addendum)
  Date: 11/11/2018  Patient name: Kathy Owens  Medical record number: [id number]  Date of birth: 1956/08/19   I have seen and evaluated this patient and I have discussed the plan of care with the house staff. Please see their note for complete details. I concur with their findings with the following additions/corrections:   Seen together with the team on rounds this morning.  Diarrhea has improved somewhat, she reports one bad accident yesterday, but only 2 total episodes of diarrhea in the past 24 hours.  We discussed that she is nearly ready to go home, but she was reluctant as she feels weak.  We will continue treatment with fidaxomicin and I encouraged her to get up with her nurses today and try to walk around the floor little bit to improve her strength and provide some reassurance that she should be able to go home soon.  Of note, I spoke with her PCP, Dr. Lynnae January, who pointed out that the rash on her buttocks began before the vancomycin.  We have not examined this rash in a while and it is still bothering her quite a bit.  We will take a look at it as a team before discharge.  I suspect that it is still bothering her because of irritation from all the diarrhea, but if it began before the vancomycin, it may be of a different etiology than the remainder of her rash.  Lenice Pressman, M.D., Ph.D. 11/11/2018, 2:33 PM

## 2018-11-12 MED ORDER — FIDAXOMICIN 200 MG PO TABS: 200.0000 mg | ORAL_TABLET | Freq: Two times a day (BID) | ORAL | 0 refills | Status: DC

## 2018-11-12 MED FILL — DIFICID 200 MG TABLET: 200 | 4 days supply | Qty: 7 | Fill #0

## 2018-11-12 NOTE — Progress Notes (Signed)
  Date: 11/12/2018  Patient name: Kathy Owens  Medical record number: [id number]  Date of birth: [birth year]   I have seen and evaluated this patient and I have discussed the plan of care with the house staff. Please see their note for complete details. I concur with their findings with the following additions/corrections:   63 year old woman with a history of cerebral palsy and CKD 3 admitted for C. difficile colitis and itchy red rash, presumed to be vancomycin allergic reaction.  Her rash continues to improve, but her diarrhea has yet to get to a manageable point.  She has had 4 episodes of diarrhea since yesterday, and continues to have accidents because it is difficult for her to get to the bathroom in time.  She has limited mobility and walks with a walker and lives by herself.  Until she is at a point where she can manage her symptoms on her own at home and maintain her hydration, she will continue to require nursing care and IV fluids here.  I am hopeful for discharge tomorrow or the next day.  Lenice Pressman, M.D., Ph.D. 11/12/2018, 4:53 PM

## 2018-11-12 NOTE — Care Management Note (Signed)
Case Management Note  Patient Details  Name: [name] MRN: [id number] Date of Birth: [birth year]  Subjective/Objective:                    Action/Plan:  Notified Tracey in [place] , patient foe discharge today. TOC will bring dificid to patient prior to discharge. Co pay is $3. Patient has debit card.   When patient ready for discharge will arrange PTAR Expected Discharge Date:                  Expected Discharge Plan:  Home/Self Care  In-House Referral:  NA  Discharge planning Services  CM Consult, Medication Assistance  Post Acute Care Choice:  NA Choice offered to:  Patient  DME Arranged:  N/A DME Agency:  NA  HH Arranged:  NA HH Agency:  NA  Status of Service:  In process, will continue to follow  If discussed at Long Length of Stay Meetings, dates discussed:    Additional Comments:  Marilu Favre, RN 11/12/2018, 9:56 AM

## 2018-11-12 NOTE — Progress Notes (Signed)
   Subjective: No overnight events. Ms. Martinique reports that she has continued to have loose stools and bowel incontinence, but her BMs are less frequent than before. She reports she was able to walk around her room yesterday. When she's at home she uses a walker to ambulate. Her biggest concern about going home is the effort of cleaning up her bowel incontinence and diarrhea. She states her rash over the body, including the buttocks, is much better. The medical team spoke with her sister via telephone to update her. All questions were addressed.   Objective:  Vital signs in last 24 hours: Vitals:   11/11/18 0532 11/11/18 1322 11/11/18 2038 11/12/18 0550  BP: (!) 158/79 132/69 140/70 (!) 148/81  Pulse: 78 77 81 66  Resp:  18 18 18   Temp: 98.2 F (36.8 C) 98.1 F (36.7 C) 98.1 F (36.7 C) 98.2 F (36.8 C)  TempSrc: Oral Oral Oral Oral  SpO2: 98% 97% 99% 99%  Weight:    74.6 kg  Height:       Gen: laying comfortably in bed, no distress CV: RRR, no murmurs Pulm: ctab, normal effort Skin: forearms and chest are covered in pink calamine lotion.   Ext: no edema  Assessment/Plan:  Principal Problem:   C. difficile colitis Active Problems:   Chronic renal insufficiency, stage 3 (moderate) (HCC)   Drug reaction   Hypokalemia  Mrs.Martinique is a 63 yo F w/ PMH of HLd, HTN, CKD3a, venous insufficiencyand cerebral palsy presenting with rash with pruritus. History consistent with allergic reaction to vancomycin. Vancomycin will need to be discontinued and alternative for her C.diff colitis need to be provided. She has had no improvement with flagyl. ID recommend fidaxomicin. She will be admitted for observation while we figure out a way for her to get fidaxomicin at home.  Body rash w/ pruritus 2/2 medication allergy -Rash is continuing to improve - Prednisone40mg  daily for dermatitis, will taper at discharge - Calamine lotion BIDfor itching - Hydrocortisone cream 1% BID  C.Diff  colitis -Continuefidaxomicin 200mg  BID(day7/10 - stop date 11/14/18) -Continues to have loose stools with bowel incontinence - Gentle hydration with 75 ml/hr LR  - Ambulate patient with nursing - Plan to discharge tomorrow if diarrhea improves  HTN - Holding home bp meds(triamterene-HCTZ 37.5-25mg  daily)in setting of acute volume loss with diarrhea  Dispo: Anticipated discharge tomorrow.  Mike Craze, DO 11/12/2018, 6:50 AM Pager: [phone]

## 2018-11-13 NOTE — Progress Notes (Signed)
   Subjective: Patient continues to have diarrhea. It is slowing down but is not resolved. States that she is very concerned about going home, getting dehydrated and not being able to come back to the hospital. She is concerned that she is dying. Continues to have itching and her bottom continues to hurt. We discussed that her course is normal for the type of infection she has.   Objective: Vital signs in last 24 hours: Vitals:   11/12/18 0550 11/12/18 1353 11/12/18 2005 11/13/18 0539  BP: (!) 148/81 (!) 153/79 135/66 (!) 142/72  Pulse: 66 72 82 78  Resp: 18 16 20 18   Temp: 98.2 F (36.8 C) 99 F (37.2 C) 98.6 F (37 C) 98.2 F (36.8 C)  TempSrc: Oral Oral Oral Oral  SpO2: 99% 98% 97% 100%  Weight: 74.6 kg   74.7 kg  Height:       Gen: Well nourished female in no acute distress Skin: forearms and chest are covered in pink calamine lotion, rash continues to improve  Assessment/Plan:  Principal Problem:   C. difficile colitis Active Problems:   Chronic renal insufficiency, stage 3 (moderate) (HCC)   Drug reaction   Hypokalemia  Kathy Owens is a 63 yo F w/ PMH of HLd, HTN, CKD3a, venous insufficiencyand cerebral palsy presenting with rash with pruritus. History consistent with allergic reaction to vancomycin. Vancomycin will need to be discontinued and alternative for her C.diff colitis need to be provided. She has had no improvement with flagyl. ID recommend fidaxomicin. She will be admitted for observation while we figure out a way for her to get fidaxomicin at home.  Body rash w/ pruritus 2/2 medication allergy -Rash iscontinuing to improve - Prednisone40mg  daily for dermatitis, will taper at discharge - Calamine lotion BIDfor itching - Hydrocortisone cream 1% BID  C.Diff colitis -Continuefidaxomicin 200mg  BID(day8/10 - stop date 11/14/18) -Continues to have loose stools with bowel incontinence - Gentle hydration with 75 ml/hr LR - Ambulate patient with  nursing  HTN - Holding home bp meds(triamterene-HCTZ 37.5-25mg  daily)in setting of acute volume loss with diarrhea   Dispo: Anticipated discharge pending on improvement of diarrhea and bowel incontinence.    Mike Craze, DO 11/13/2018, 6:55 AM Pager: [phone]

## 2018-11-13 NOTE — Progress Notes (Signed)
  Date: 11/13/2018  Patient name: Kathy Owens  Medical record number: [id number]  Date of birth: [birth year]   I have seen and evaluated this patient and I have discussed the plan of care with the house staff. Please see Dr. Olevia Perches note for complete details. I concur with her findings.  Sid Falcon, MD 11/13/2018, 8:59 PM

## 2018-11-14 MED ORDER — PREDNISONE 20 MG PO TABS
20.0000 mg | ORAL_TABLET | Freq: Every day | ORAL | Status: DC
  Administered 2018-11-14 – 2018-11-15 (×2): 20 mg via ORAL
  Filled 2018-11-14 (×2): qty 1

## 2018-11-14 NOTE — Progress Notes (Signed)
  Date: 11/14/2018  Patient name: Kathy Owens  Medical record number: [id number]  Date of birth: October 20, 1956   This patient's plan of care was discussed with the house staff. Please see their note for complete details. I concur with their findings.   Sid Falcon, MD 11/14/2018, 11:30 PM

## 2018-11-14 NOTE — Progress Notes (Signed)
   Subjective: patient feeling well this morning. She tells me that she only had two accidents yesterday and they occurred shortly after eating. She continues to be nervous about going home. Asking what she needs to do to prevent spread or reinfection after discharge. Discussed that the transitional pharmacy will be delivering her medication is close today so we will be unable to get her home. We will plan for discharge tomorrow.  Objective: Vital signs in last 24 hours: Vitals:   11/12/18 2005 11/13/18 0539 11/13/18 2141 11/14/18 0546  BP: 135/66 (!) 142/72 114/62 (!) 142/82  Pulse: 82 78 70 72  Resp: 20 18 18 18   Temp: 98.6 F (37 C) 98.2 F (36.8 C) 97.9 F (36.6 C) 98.8 F (37.1 C)  TempSrc: Oral Oral Oral Oral  SpO2: 97% 100% 97% 99%  Weight:  74.7 kg  74.4 kg  Height:       General: Well nourished female in no acute distress Pulm: Good air movement with no wheezing or crackles  CV: RRR, no murmurs, no rubs   Assessment/Plan:  Kathy Owens is a 63 yo F w/ PMH of HLd, HTN, CKD3a, venous insufficiencyand cerebral palsy who was recently diagnosed with C. Diff and started on Vancomycin presenting with rash with pruritus. History consistent with allergic reaction to vancomycin. She was subsequently admitted for further evaluation and management of her C. Diff.    Body rash w/ pruritus 2/2 medication allergy -Rash iscontinuing to improve - Prednisone40mg  daily for dermatitis, will decrease to 20 mg today and start taper - Calamine lotion BIDfor itching - Hydrocortisone cream 1% BID  C.Diff colitis -Continuefidaxomicin 200mg  BID(End date 11/16/2018) -Loose stools are decreasing in frequency and quantity  - Discontinue IVF - Ambulate patient with nursing  HTN - Holding home bp meds(triamterene-HCTZ 37.5-25mg  daily)in setting of acute volume loss with diarrhea  Dispo: Unfortunately the [facility] care pharmacy is closed today so the patient is unable to get her  fidaxomicin. Plan to discharge tomorrow.   Ina Homes, MD 11/14/2018, 8:58 AM

## 2018-11-15 ENCOUNTER — Telehealth: Payer: Self-pay

## 2018-11-15 MED ORDER — GERHARDT'S BUTT CREAM: 1.0000 "application " | TOPICAL_CREAM | CUTANEOUS | 0 refills | Status: AC | PRN

## 2018-11-15 MED ORDER — PREDNISONE 10 MG PO TABS: 15.0000 mg | ORAL_TABLET | Freq: Every day | ORAL | 0 refills | Status: AC

## 2018-11-15 MED ORDER — FIDAXOMICIN 200 MG PO TABS: 200.0000 mg | ORAL_TABLET | Freq: Two times a day (BID) | ORAL | 0 refills | Status: AC

## 2018-11-15 MED ORDER — PREDNISONE 10 MG PO TABS: 10.0000 mg | ORAL_TABLET | Freq: Every day | ORAL | 0 refills | Status: AC

## 2018-11-15 MED ORDER — HYDROCORTISONE 1 % EX CREA: TOPICAL_CREAM | Freq: Two times a day (BID) | CUTANEOUS | 0 refills | Status: DC

## 2018-11-15 MED ORDER — PREDNISONE 5 MG PO TABS: 5.0000 mg | ORAL_TABLET | Freq: Every day | ORAL | 0 refills | Status: AC

## 2018-11-15 MED ORDER — CALAMINE EX LOTN: TOPICAL_LOTION | Freq: Two times a day (BID) | CUTANEOUS | 0 refills | Status: DC

## 2018-11-15 MED FILL — HYDROCORTISONE 1% CREAM: 1 | 14 days supply | Qty: 28 | Fill #0

## 2018-11-15 MED FILL — predniSONE 10 MG TABS: 10 | 6 days supply | Qty: 7 | Fill #0

## 2018-11-15 NOTE — Telephone Encounter (Signed)
Hospital TOC per DR Laural Golden, discharge 11/15/18, appt 11/25/18.

## 2018-11-15 NOTE — Progress Notes (Addendum)
   Subjective: No overnight events. Kathy Owens reports that she feels great this morning and is ready to go home. All of her questions were addressed.  Objective:  Vital signs in last 24 hours: Vitals:   11/14/18 1422 11/14/18 2004 11/15/18 0500 11/15/18 0636  BP: (!) 147/72 130/78  (!) 154/88  Pulse: 72 80  73  Resp:  18  19  Temp: 98.2 F (36.8 C) 98.4 F (36.9 C)  98.8 F (37.1 C)  TempSrc: Oral Oral  Axillary  SpO2: 97% 97%  98%  Weight:   75.3 kg   Height:       Gen: laying comfortably in bed, no distress CV: RRR, no murmurs Skin: rash has mostly resolved, still some faint erythema particularly on the arms and trunk  Assessment/Plan:  Principal Problem:   C. difficile colitis Active Problems:   Chronic renal insufficiency, stage 3 (moderate) (HCC)   Drug reaction   Hypokalemia  Kathy Owens is a 63 yo F w/ PMH of HLd, HTN, CKD3a, venous insufficiencyand cerebral palsy who was recently diagnosed with C. Diff and started on Vancomycin presenting with rash with pruritus. History consistent with allergic reaction to vancomycin. She was subsequently admitted for further evaluation and management of her C. Diff.    Body rash w/ pruritus 2/2 medication allergy -Rash iscontinuing to improve - Prednisone20mg  for dermatitis, continue taper on discharge - Calamine lotion BIDfor itching - Hydrocortisone cream 1% BID - Discharge home today  C.Diff colitis -Continuefidaxomicin 200mg  BID -Loose stools are decreasing in frequency and quantity   HTN - Holding home bp meds(triamterene-HCTZ 37.5-25mg  daily)in setting of acute volume loss with diarrhea  Dispo: Patient is medically stable for discharge today.  Mike Craze, DO 11/15/2018, 6:52 AM Pager: [phone]

## 2018-11-15 NOTE — Care Management Note (Signed)
Case Management Note  Patient Details  Name: Kathy Owens MRN: [id number] Date of Birth: [birth year]  Subjective/Objective:                    Action/Plan:  Patient from home alone. See previous notes. Patient received Dificid prescription filled by [facility] on Friday November 12, 2018 and has medication at bedside. Awaiting new prescriptions to be filled by TOC. Patient requesting ambulance transport home. Confirmed address. [facility] paperwork competed and in shadow chart. Once patient ready for discharge will call [facility].Bedside nurse aware. Expected Discharge Date:  11/15/18               Expected Discharge Plan:  Home/Self Care  In-House Referral:  NA  Discharge planning Services  CM Consult, Medication Assistance  Post Acute Care Choice:  NA Choice offered to:  Patient  DME Arranged:  N/A DME Agency:  NA  HH Arranged:  NA HH Agency:  NA  Status of Service:  Completed, signed off  If discussed at [place] of Stay Meetings, dates discussed:    Additional Comments:  Marilu Favre, RN 11/15/2018, 11:09 AM

## 2018-11-15 NOTE — Discharge Instructions (Signed)
Kathy Owens,  You were hospitalized due to a drug reaction secondary to vancomycin. We stopped that medication and started you on an antibiotic called Fidaxomicin. I want you to continue to take this medication twice a day for 4 days. Take one dose this evening and then continue to take 1 tablet twice a day. I have also sent in prescriptions for hydrocortisone cream for your rash to use as needed.  I have also sent in a prescription for prednisone. I want you to start taking this medication tomorrow.  Please take 15 mg for two days, 1/28-1/30 Then take 10 mg for two days, 1/31-2/2 Then take 5 mg for two days, 2/3-2/5  You may resume all your other home medications as prescribed. Please follow up in the clinic in about 1 week.  Thank you for allowing [place] to be part of your care!

## 2018-11-15 NOTE — Discharge Summary (Signed)
Name: Kathy Owens MRN: [id number] DOB: 06/23/1956 63 y.o. PCP: Bartholomew Crews, MD  Date of Admission: 11/06/2018  4:35 PM Date of Discharge: 11/15/2018 Attending Physician: Oda Kilts, MD  Discharge Diagnosis: 1. C Difficile Colitis 2. Drug reaction to vancomycin  3. Hypokalemia 4. AKI on CKD  Discharge Medications: Allergies as of 11/15/2018      Reactions   Vancomycin Hives   PO vancomycin for C.diff (not IV).  TDD 11/09/18.   Morphine Other (See Comments)   Hallucinations      Medication List    STOP taking these medications   vancomycin 125 MG capsule Commonly known as:  VANCOCIN     TAKE these medications   alendronate 70 MG tablet Commonly known as:  FOSAMAX TAKE 1 TABLET(70 MG) BY MOUTH EVERY 7 DAYS WITH A FULL GLASS OF WATER AND ON AN EMPTY STOMACH What changed:  See the new instructions.   aspirin 81 MG EC tablet Take 1 tablet (81 mg total) by mouth daily.   atorvastatin 10 MG tablet Commonly known as:  LIPITOR TAKE 1 TABLET(10 MG) BY MOUTH DAILY What changed:  See the new instructions.   calamine lotion Apply topically 2 (two) times daily.   famotidine 10 MG tablet Commonly known as:  PEPCID Take 1 tablet (10 mg total) by mouth 2 (two) times daily.   fidaxomicin 200 MG Tabs tablet Commonly known as:  DIFICID Take 1 tablet (200 mg total) by mouth 2 (two) times daily for 4 days.   Gerhardt's butt cream Crea Apply 1 application topically as needed for up to 30 days for irritation.   HYDROcodone-acetaminophen 5-325 MG tablet Commonly known as:  NORCO/VICODIN Take 1 tablet by mouth every 6 (six) hours as needed for moderate pain or severe pain.   hydrocortisone cream 1 % Apply topically 2 (two) times daily.   naproxen 500 MG EC tablet Commonly known as:  EC NAPROSYN Take 1 tablet (500 mg total) by mouth 2 (two) times daily with a meal. What changed:  when to take this   ondansetron 4 MG tablet Commonly known as:   ZOFRAN Take 1 tablet (4 mg total) by mouth every 8 (eight) hours as needed for nausea or vomiting.   predniSONE 10 MG tablet Commonly known as:  DELTASONE Take 1.5 tablets (15 mg total) by mouth daily with breakfast for 2 days. Start taking on:  November 16, 2018   predniSONE 10 MG tablet Commonly known as:  DELTASONE Take 1 tablet (10 mg total) by mouth daily with breakfast for 2 days. Start taking on:  November 18, 2018   predniSONE 5 MG tablet Commonly known as:  DELTASONE Take 1 tablet (5 mg total) by mouth daily with breakfast for 2 days. Start taking on:  November 21, 2018   triamterene-hydrochlorothiazide 37.5-25 MG tablet Commonly known as:  MAXZIDE-25 TAKE 1 TABLET BY MOUTH DAILY       Disposition and follow-up:   Kathy Owens was discharged from [facility] in Stable condition.  At the hospital follow up visit please address:  1.  C diff colitis: please make sure patient completed her course of Fidaxomicin and is no longer having diarrhea/fecal incontinence   2.  Labs / imaging needed at time of follow-up: none  3.  Pending labs/ test needing follow-up: none  Follow-up Appointments:  Bartholomew Crews, MD  11/25/18 at 1045 am  Hospital Course by problem list: 1. C Difficile Colitis 2. Drug  reaction to vancomycin  3. Hypokalemia 4. AKI on CKD  Kathy Owens presented with a rash with pruritus. She was in her usual state of health until 09/20/18 when she developed left leg pain and swelling which was diagnosed as cellulitis and she was discharged with clindamycin. Shortly after she developed significant watery diarrhea. She finished her clindamycin course and her LLE cellulitis resolved but she continued to have diarrhea. She visited the ED on 10/26/17 due to increasing abdominal pain and was started on cipro and flagyl. She had follow up visit with Dr.Butcher and was found to have positive C.diff antigen and PCR. Cipro and flagyl were discontinued  and she was started on vancomycin the day before admission. Shortly after taking her 1st vanc dose, she developed generalized rash with pruritus that began around her torso. She continued to take her vancomycin every 6 hours and the rash began to spread down to her upper legs and neck.   She came to the ED for further evaluation and was started on Fidaxomicin for 10 days. She was also treated with prednisone and topical ointments. Her rash, AKI and diarrhea continued to improve during her hospital stay and her potassium was orally repleted. She was discharged to continue fidaxomicin for 4 more days and with a prednisone taper and creams for her rash.    Discharge Vitals:   BP (!) 154/88 (BP Location: Left Arm)   Pulse 73   Temp 98.8 F (37.1 C) (Axillary)   Resp 19   Ht 4\' 11"  (1.499 m)   Wt 75.4 kg   SpO2 98%   BMI 33.57 kg/m   Pertinent Labs, Studies, and Procedures:   BMP Latest Ref Rng & Units 11/10/2018 11/09/2018 11/08/2018  Glucose 70 - 99 mg/dL 120(H) 114(H) 136(H)  BUN 8 - 23 mg/dL 26(H) 27(H) 20  Creatinine 0.44 - 1.00 mg/dL 1.05(H) 1.14(H) 1.30(H)  BUN/Creat Ratio 12 - 28 - - -  Sodium 135 - 145 mmol/L 140 140 141  Potassium 3.5 - 5.1 mmol/L 4.6 4.2 4.2  Chloride 98 - 111 mmol/L 109 109 106  CO2 22 - 32 mmol/L 24 22 24   Calcium 8.9 - 10.3 mg/dL 8.5(L) 8.6(L) 8.2(L)    Discharge Instructions: Discharge Instructions    Diet - low sodium heart healthy   Complete by:  As directed    Diet - low sodium heart healthy   Complete by:  As directed    Increase activity slowly   Complete by:  As directed    Increase activity slowly   Complete by:  As directed     [name],  You were hospitalized due to a drug reaction secondary to vancomycin. We stopped that medication and started you on an antibiotic called Fidaxomicin. I want you to continue to take this medication twice a day for 4 days. Take one dose this evening and then continue to take 1 tablet twice a day. I have  also sent in prescriptions for hydrocortisone cream for your rash to use as needed.  I have also sent in a prescription for prednisone. I want you to start taking this medication tomorrow.  Please take 15 mg for two days, 1/28-1/30 Then take 10 mg for two days, 1/31-2/2 Then take 5 mg for two days, 2/3-2/5  You may resume all your other home medications as prescribed. Please follow up in the clinic in about 1 week.  Thank you for allowing [place] to be part of your care!  Signed: Rehman, Charlsie Quest,  DO 11/15/2018, 10:30 AM   Pager: [phone]

## 2018-11-22 ENCOUNTER — Ambulatory Visit (INDEPENDENT_AMBULATORY_CARE_PROVIDER_SITE_OTHER): Payer: Medicare Other | Admitting: Internal Medicine

## 2018-11-22 ENCOUNTER — Encounter: Payer: Self-pay | Admitting: Internal Medicine

## 2018-11-22 VITALS — BP 164/72 | HR 78 | Temp 98.5°F

## 2018-11-22 DIAGNOSIS — Z91120 Patient's intentional underdosing of medication regimen due to financial hardship: Secondary | ICD-10-CM | POA: Diagnosis not present

## 2018-11-22 DIAGNOSIS — H6120 Impacted cerumen, unspecified ear: Secondary | ICD-10-CM

## 2018-11-22 DIAGNOSIS — R110 Nausea: Secondary | ICD-10-CM

## 2018-11-22 DIAGNOSIS — H6121 Impacted cerumen, right ear: Secondary | ICD-10-CM

## 2018-11-22 DIAGNOSIS — A0472 Enterocolitis due to Clostridium difficile, not specified as recurrent: Secondary | ICD-10-CM | POA: Diagnosis not present

## 2018-11-22 DIAGNOSIS — K219 Gastro-esophageal reflux disease without esophagitis: Secondary | ICD-10-CM | POA: Diagnosis not present

## 2018-11-22 MED ORDER — FAMOTIDINE 10 MG PO TABS: 10.0000 mg | ORAL_TABLET | Freq: Two times a day (BID) | ORAL | 3 refills | Status: DC

## 2018-11-22 NOTE — Assessment & Plan Note (Signed)
Patient is here with complaint of hearing loss. On exam her left ear canal was clear with normal appearing tympanic membrane but right ear canal was impacted with cerumen. This was cleaned out with hydrogen peroxide and water solution and her hearing immediately improved.

## 2018-11-22 NOTE — Progress Notes (Signed)
Internal Medicine Clinic Attending  Case discussed with Dr. Guilloud at the time of the visit.  We reviewed the resident's history and exam and pertinent patient test results.  I agree with the assessment, diagnosis, and plan of care documented in the resident's note.  

## 2018-11-22 NOTE — Assessment & Plan Note (Signed)
Patient is here for follow up of her C diff colitis. She was recently admitted and treated with fidaxomicin. Her diarrhea is improving, but her BMs have not yet returned to normal. She continues to have loose stool up to 4 times a day. She also has some associated nausea and stomach discomfort. Educated patient that BMs may take weeks to improve after c diff infection. Instructed patient to come in for repeat testing if diarrhea returns or symptoms worsen.

## 2018-11-22 NOTE — Patient Instructions (Addendum)
Ms. Martinique,  It was a pleasure to see you. For your hearing loss, I have referred you to an ear, nose, and throat doctor for further testing.   Your diarrhea should continue to improve. If your symptoms worsen again, please return for follow up and repeat testing.   Please keep your scheduled appointment with Dr. Lynnae January in April. If you have any questions or concerns, call our clinic at [phone] or after hours call [phone] and ask for the internal medicine resident on call. Thank you!  Dr. Philipp Ovens

## 2018-11-22 NOTE — Assessment & Plan Note (Signed)
Patient unable to afford previously prescribed pepcid. Reviewed prior PCP notes, sent prescription to [facility] outpatient pharmacy, where it should be $3 over the counter.
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
## 2018-11-25 ENCOUNTER — Encounter: Payer: Self-pay | Admitting: Internal Medicine

## 2018-11-26 NOTE — Telephone Encounter (Signed)
Came to appt, had no complaints or needs from adm

## 2018-12-08 ENCOUNTER — Other Ambulatory Visit: Payer: Self-pay | Admitting: Internal Medicine

## 2018-12-08 DIAGNOSIS — E785 Hyperlipidemia, unspecified: Secondary | ICD-10-CM

## 2018-12-08 DIAGNOSIS — I10 Essential (primary) hypertension: Secondary | ICD-10-CM

## 2018-12-14 ENCOUNTER — Telehealth: Payer: Self-pay

## 2018-12-14 ENCOUNTER — Ambulatory Visit: Payer: Medicare Other | Admitting: Podiatry

## 2018-12-14 NOTE — Telephone Encounter (Signed)
Thank you for the update!

## 2018-12-14 NOTE — Telephone Encounter (Signed)
Pt called with update for Dr. Lynnae January.  Pt dx'd with C. Diff recently, was seen in [facility] on 11/22/18.  Pt states she is feeling much better.  Denies any loose stools.  Having solid stools 1X to 2-3X/day.  Denies any nausea or abdominal discomfort.  Voices appreciation for [facility].  Will forward update to PCP. SChaplin, RN,BSN

## 2018-12-22 DIAGNOSIS — H47013 Ischemic optic neuropathy, bilateral: Secondary | ICD-10-CM | POA: Diagnosis not present

## 2018-12-29 ENCOUNTER — Other Ambulatory Visit: Payer: Self-pay

## 2018-12-29 MED ORDER — HYDROCODONE-ACETAMINOPHEN 5-325 MG PO TABS: 1.0000 | ORAL_TABLET | Freq: Four times a day (QID) | ORAL | 0 refills | Status: DC | PRN

## 2018-12-29 NOTE — Addendum Note (Signed)
Addended by: Larey Dresser A on: 12/29/2018 02:18 PM   Modules accepted: Orders

## 2018-12-29 NOTE — Telephone Encounter (Signed)
HYDROcodone-acetaminophen (NORCO/VICODIN) 5-325 MG tablet   Refill request @  [id number] - [place], [place] [phone] (Phone) [phone] (Fax)

## 2019-01-14 ENCOUNTER — Telehealth: Payer: Self-pay | Admitting: Internal Medicine

## 2019-01-14 NOTE — Telephone Encounter (Signed)
Pt does not have a cough or cold, denies fevers, aches, chills, h/a, n&v. She is concerned about what to do, instructed to keep everything clean, wash hands often, drink plenty of fluids, eat well, rest, stay in away from others as much as possible. Have supplies brought to door and left. She is agreeable and states she hopes dr Software engineer is well and happy. She states thank you to everyone and the reassurance and she will do the above mentioned.

## 2019-01-14 NOTE — Telephone Encounter (Signed)
Pt concerned about COVID and would like a call back from a nurse.

## 2019-01-14 NOTE — Telephone Encounter (Signed)
Tried to call pt just to say hi, no answer

## 2019-01-19 ENCOUNTER — Ambulatory Visit: Payer: Medicare Other | Admitting: Podiatry

## 2019-01-24 ENCOUNTER — Telehealth: Payer: Self-pay | Admitting: *Deleted

## 2019-01-24 NOTE — Telephone Encounter (Signed)
Telephone encounter opened in error while tracking a referral - no call made or received at this time. Yvonna Alanis, RN, 01/24/19

## 2019-02-03 ENCOUNTER — Ambulatory Visit: Payer: Self-pay | Admitting: Internal Medicine

## 2019-03-09 ENCOUNTER — Ambulatory Visit: Payer: Medicare Other | Admitting: Podiatry

## 2019-03-24 ENCOUNTER — Ambulatory Visit: Payer: Self-pay | Admitting: Internal Medicine

## 2019-03-28 ENCOUNTER — Other Ambulatory Visit: Payer: Self-pay | Admitting: Internal Medicine

## 2019-03-28 MED ORDER — HYDROCODONE-ACETAMINOPHEN 5-325 MG PO TABS: 1.0000 | ORAL_TABLET | Freq: Four times a day (QID) | ORAL | 0 refills | Status: DC | PRN

## 2019-03-28 NOTE — Telephone Encounter (Signed)
Done

## 2019-03-28 NOTE — Telephone Encounter (Signed)
Pt needs on HYDROcodone-acetaminophen (NORCO/VICODIN) 5-325 MG tablet triamterene-hydrochlorothiazide (MAXZIDE-25) 37.5-25 MG tablet atorvastatin (LIPITOR) 10 MG tablet  ;pt contact [place] [place] [facility]

## 2019-03-28 NOTE — Telephone Encounter (Signed)
#  90 w/ 3 RF's were written on 12/08/18 for Maxzide and Lipitor, this nurse called pt to inform her she has refills available on these 2 meds, no answer, will attempt call later.  Will forward request for hydrocodone to pcp. LOV  11/22/18 NOV 05/26/19 RX last written 12/29/18 (#3)  Thanks, SChaplin, RN,BSN

## 2019-04-20 ENCOUNTER — Ambulatory Visit: Payer: Medicare Other | Admitting: Podiatry

## 2019-05-26 ENCOUNTER — Ambulatory Visit (INDEPENDENT_AMBULATORY_CARE_PROVIDER_SITE_OTHER): Payer: Medicare Other | Admitting: Internal Medicine

## 2019-05-26 ENCOUNTER — Other Ambulatory Visit: Payer: Self-pay

## 2019-05-26 VITALS — BP 137/78 | HR 91 | Temp 98.3°F | Wt 143.8 lb

## 2019-05-26 DIAGNOSIS — R197 Diarrhea, unspecified: Secondary | ICD-10-CM

## 2019-05-26 DIAGNOSIS — Z7982 Long term (current) use of aspirin: Secondary | ICD-10-CM

## 2019-05-26 DIAGNOSIS — K219 Gastro-esophageal reflux disease without esophagitis: Secondary | ICD-10-CM

## 2019-05-26 DIAGNOSIS — E785 Hyperlipidemia, unspecified: Secondary | ICD-10-CM

## 2019-05-26 DIAGNOSIS — R634 Abnormal weight loss: Secondary | ICD-10-CM

## 2019-05-26 DIAGNOSIS — K529 Noninfective gastroenteritis and colitis, unspecified: Secondary | ICD-10-CM | POA: Diagnosis not present

## 2019-05-26 DIAGNOSIS — M810 Age-related osteoporosis without current pathological fracture: Secondary | ICD-10-CM

## 2019-05-26 DIAGNOSIS — Z8619 Personal history of other infectious and parasitic diseases: Secondary | ICD-10-CM | POA: Diagnosis not present

## 2019-05-26 DIAGNOSIS — K828 Other specified diseases of gallbladder: Secondary | ICD-10-CM | POA: Diagnosis not present

## 2019-05-26 DIAGNOSIS — G894 Chronic pain syndrome: Secondary | ICD-10-CM

## 2019-05-26 DIAGNOSIS — R6889 Other general symptoms and signs: Secondary | ICD-10-CM | POA: Diagnosis not present

## 2019-05-26 DIAGNOSIS — Z8719 Personal history of other diseases of the digestive system: Secondary | ICD-10-CM

## 2019-05-26 DIAGNOSIS — G8929 Other chronic pain: Secondary | ICD-10-CM

## 2019-05-26 DIAGNOSIS — I10 Essential (primary) hypertension: Secondary | ICD-10-CM | POA: Diagnosis not present

## 2019-05-26 DIAGNOSIS — Z79899 Other long term (current) drug therapy: Secondary | ICD-10-CM

## 2019-05-26 DIAGNOSIS — M8000XA Age-related osteoporosis with current pathological fracture, unspecified site, initial encounter for fracture: Secondary | ICD-10-CM

## 2019-05-26 DIAGNOSIS — Z79891 Long term (current) use of opiate analgesic: Secondary | ICD-10-CM

## 2019-05-26 NOTE — Patient Instructions (Signed)
1.  Trying to get a stool sample to [place] so that I can make certain you no longer have C. Difficile 2.  Please see me in 3 months via telephone appointment 3.  I will refer you to surgery and see if they can do a telephone appointment for the porcelain gallbladder 4.  I will mail you or call you about your test results

## 2019-05-27 ENCOUNTER — Other Ambulatory Visit: Payer: Self-pay | Admitting: Internal Medicine

## 2019-05-27 ENCOUNTER — Encounter: Payer: Self-pay | Admitting: Internal Medicine

## 2019-05-27 DIAGNOSIS — G808 Other cerebral palsy: Secondary | ICD-10-CM

## 2019-05-27 DIAGNOSIS — R634 Abnormal weight loss: Secondary | ICD-10-CM

## 2019-05-27 LAB — CMP14 + ANION GAP
ALT: 28 IU/L (ref 0–32)
AST: 42 IU/L — ABNORMAL HIGH (ref 0–40)
Albumin/Globulin Ratio: 2.4 — ABNORMAL HIGH (ref 1.2–2.2)
Albumin: 5 g/dL — ABNORMAL HIGH (ref 3.8–4.8)
Alkaline Phosphatase: 64 IU/L (ref 39–117)
Anion Gap: 20 mmol/L — ABNORMAL HIGH (ref 10.0–18.0)
BUN/Creatinine Ratio: 15 (ref 12–28)
BUN: 17 mg/dL (ref 8–27)
Bilirubin Total: 0.5 mg/dL (ref 0.0–1.2)
CO2: 20 mmol/L (ref 20–29)
Calcium: 10 mg/dL (ref 8.7–10.3)
Chloride: 102 mmol/L (ref 96–106)
Creatinine, Ser: 1.13 mg/dL — ABNORMAL HIGH (ref 0.57–1.00)
GFR calc Af Amer: 59 mL/min/{1.73_m2} — ABNORMAL LOW (ref >59)
GFR calc non Af Amer: 51 mL/min/{1.73_m2} — ABNORMAL LOW (ref >59)
Globulin, Total: 2.1 g/dL (ref 1.5–4.5)
Glucose: 110 mg/dL — ABNORMAL HIGH (ref 65–99)
Potassium: 4.1 mmol/L (ref 3.5–5.2)
Sodium: 142 mmol/L (ref 134–144)
Total Protein: 7.1 g/dL (ref 6.0–8.5)

## 2019-05-27 LAB — CBC WITH DIFFERENTIAL/PLATELET
Basophils Absolute: 0 10*3/uL (ref 0.0–0.2)
Basos: 1 %
EOS (ABSOLUTE): 0.1 10*3/uL (ref 0.0–0.4)
Eos: 2 %
Hematocrit: 43 % (ref 34.0–46.6)
Hemoglobin: 13.8 g/dL (ref 11.1–15.9)
Immature Grans (Abs): 0 10*3/uL (ref 0.0–0.1)
Immature Granulocytes: 0 %
Lymphocytes Absolute: 1.3 10*3/uL (ref 0.7–3.1)
Lymphs: 21 %
MCH: 29.2 pg (ref 26.6–33.0)
MCHC: 32.1 g/dL (ref 31.5–35.7)
MCV: 91 fL (ref 79–97)
Monocytes Absolute: 0.3 10*3/uL (ref 0.1–0.9)
Monocytes: 6 %
Neutrophils Absolute: 4.2 10*3/uL (ref 1.4–7.0)
Neutrophils: 70 %
Platelets: 270 10*3/uL (ref 150–450)
RBC: 4.72 x10E6/uL (ref 3.77–5.28)
RDW: 13.6 % (ref 11.7–15.4)
WBC: 5.9 10*3/uL (ref 3.4–10.8)

## 2019-05-27 LAB — FERRITIN: Ferritin: 118 ng/mL (ref 15–150)

## 2019-05-27 LAB — SEDIMENTATION RATE: Sed Rate: 13 mm/hr (ref 0–40)

## 2019-05-27 LAB — TSH: TSH: 1.93 u[IU]/mL (ref 0.450–4.500)

## 2019-05-27 LAB — VITAMIN B12: Vitamin B-12: 208 pg/mL — ABNORMAL LOW (ref 232–1245)

## 2019-05-27 LAB — C-REACTIVE PROTEIN: CRP: 2 mg/L (ref 0–10)

## 2019-05-27 MED ORDER — HYDROCODONE-ACETAMINOPHEN 5-325 MG PO TABS: 1.0000 | ORAL_TABLET | Freq: Four times a day (QID) | ORAL | 0 refills | Status: DC | PRN

## 2019-05-27 MED ORDER — NAPROXEN 500 MG PO TBEC: 500.0000 mg | DELAYED_RELEASE_TABLET | Freq: Two times a day (BID) | ORAL | 0 refills | Status: DC

## 2019-05-27 MED ORDER — ALENDRONATE SODIUM 70 MG PO TABS: ORAL_TABLET | ORAL | 3 refills | Status: DC

## 2019-05-27 MED ORDER — ATORVASTATIN CALCIUM 10 MG PO TABS: ORAL_TABLET | ORAL | 3 refills | Status: DC

## 2019-05-27 NOTE — Assessment & Plan Note (Addendum)
This problem is chronic and stable. I tired to run database but couldn't get in. I refilled her hydrocodone. States Kathy Owens doesn't take her meds. Said she takes the hydrocodone 4 times a day. Taking naproxen about once a week.  PLAN:  Cont current meds Check BMP

## 2019-05-27 NOTE — Assessment & Plan Note (Signed)
This problem is chronic and controlled. She stopped her alendronate when I told her about the porcelain gallbladder to decrease further calcifications. I explained this wouldn't help and she agreed to restart.  PLAN:  Cont current meds

## 2019-05-27 NOTE — Assessment & Plan Note (Addendum)
This problem is chronic but not yet W/U. It was an Incidental finding on CT 10/2018. She is agreeable to gen surg consult but prefers tele appt due to transportation issues. The CT shows selective calcification which puts her at higher malignancy risk (and with recent 20 lb weight loss though likely 2/2 C diff complications) but she has co-morbidities. So gen surg consult for expert recs.  PLAN : gen surg

## 2019-05-27 NOTE — Assessment & Plan Note (Signed)
This problem is improved but not resolved.  Her C. difficile infection was treated in January.  She states since then, her bowel movements are not back to normal.  She states when she wakes up she has to sit on her bedside toilet for an hour in order to get all of the stool out.  She says frequently it will be watery or loose.  It may be formed but it is soft.  Sometimes she has a second stool later in the day.  She denies abdominal pain or fevers.  She states when she eats, food runs through her.  She has lost about 20 pounds since January and her weight loss is visible just by looking at her.  She is eating a lot of activia yogurt.  My first concern is that she could still have C. difficile but the differential also includes a postinfectious colitis/diarrhea, IBD, microscopic colitis.  I am going to try to obtain a C. difficile stool sample but she states she has no way to return a stool sample.  I will see if I can get home health who could return a stool sample.  I also discussed that a colonoscopy would be indicated if she is C diff negative. She stated that would not be possible due to inability to get to toilet (jc bedside) quickly and inability to tolerate a rectal tube when in hospital for C diff. I will also check send rate and CRP.  PLAN : stool C diff Sed rate and CRP Consider colonoscopy..  Sed rate and CRP nl making C diff and IBD unlikely

## 2019-05-27 NOTE — Assessment & Plan Note (Signed)
This problem is chronic and stable. On maxzide and BP at goal for age and comorbiditites.  PLAN:  Cont current meds   BP Readings from Last 3 Encounters:  05/26/19 137/78  11/22/18 (!) 164/72  11/15/18 (!) 154/88
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
## 2019-05-30 ENCOUNTER — Telehealth: Payer: Self-pay | Admitting: *Deleted

## 2019-05-30 NOTE — Telephone Encounter (Signed)
Patient with [facility] earlier this year. Have sent a Community Message to [employer], Patsy Baltimore, and Jiles Crocker at [facility] to see if they can reopen this case for [facility] PT and RN. Hubbard Hartshorn, RN, BSN

## 2019-05-31 NOTE — Telephone Encounter (Signed)
Received message from Butch Penny with [employer] that they are unable to take patient at this time.  Call placed to Joen Laura at [facility]. She will check with office to see if they can take this patient for [facility] RN and PT. Hubbard Hartshorn, RN, BSN

## 2019-05-31 NOTE — Telephone Encounter (Signed)
Joen Laura, RN from [facility] returned call and is able to take patient for [facility] RN and PT. She will call back with SOC date. Hubbard Hartshorn, RN, BSN

## 2019-06-02 ENCOUNTER — Telehealth: Payer: Self-pay | Admitting: *Deleted

## 2019-06-02 DIAGNOSIS — R197 Diarrhea, unspecified: Secondary | ICD-10-CM

## 2019-06-02 MED ORDER — CYANOCOBALAMIN 1000 MCG/ML IJ SOLN: 1000.0000 ug | INTRAMUSCULAR | 0 refills | Status: DC

## 2019-06-02 NOTE — Telephone Encounter (Signed)
Thank you.  My intention was to switch her to oral B12 after her levels became normal.  But a level after 3 injections would be great but the level cannot be drawn after an injection.  They would have to draw the blood first then give the B12.

## 2019-06-02 NOTE — Telephone Encounter (Signed)
Called Darlene RN, [facility] - verbal order given for collection of stool for C. Diff also weekly Vit B 12 injections per Dr Lynnae January. Michela Pitcher they will be able to see pt for the 3 weeks then do labs if ordered by the doctor to check level to see if injections need to be continued.

## 2019-06-02 NOTE — Telephone Encounter (Signed)
Call from [place] at [facility] -stated pt told her she will need VitB 12 injections and stool specimen. But Carlyon Shadow stated they have not received any orders; stated she can take a verbal order. Also vit B needs to be sen to pt's pharmacy. Please advise Thanks

## 2019-06-02 NOTE — Telephone Encounter (Signed)
I ordered the vitamin B12 to be sent to her pharmacy for a total of 3 weekly doses.  If they can offer services beyond 3 weeks, I will be happy to send in a refill.  I did not know how to order the stool for C. difficile so I put it in as DME other.  Please let me know if that is not correct.

## 2019-06-03 ENCOUNTER — Telehealth: Payer: Self-pay | Admitting: Internal Medicine

## 2019-06-03 NOTE — Telephone Encounter (Signed)
Pt start of start Sun/Mon [facility] [phone]

## 2019-06-03 NOTE — Telephone Encounter (Signed)
So noted 

## 2019-06-06 ENCOUNTER — Telehealth: Payer: Self-pay | Admitting: *Deleted

## 2019-06-06 NOTE — Telephone Encounter (Signed)
Any way to find out cost in IM office? She can start PO B12 - 1000 mcg po QD - just will take longer to get to cl.

## 2019-06-06 NOTE — Telephone Encounter (Signed)
Fax from [employer] - pt cannot afford Vit B 12 injections. I called [facility] - stated pt's insurance will not cover; cost $29.89. Thanks

## 2019-06-07 ENCOUNTER — Other Ambulatory Visit: Payer: Self-pay | Admitting: *Deleted

## 2019-06-07 NOTE — Telephone Encounter (Signed)
You ar correct. Can Home health receive it as fax or do I need to mail?

## 2019-06-07 NOTE — Telephone Encounter (Signed)
I called pt about Vit B 12 injections - no answer. I talked to [place] - stated pt will be charged a medication fee (about $10) and and an administration fee (about $115) if given in the office.

## 2019-06-07 NOTE — Telephone Encounter (Signed)
I looked up GoodRx on google - there appears to be a coupon for Vit B12 at [facility] for $11.54 for 3 vials ( please double check).

## 2019-06-07 NOTE — Telephone Encounter (Signed)
Call from pt - explained with GoodRx coupon, cost will be $11.54 for 3 vials; stated she will be able afford. Also stated she does not like shots but will do what is best. I told her I will be able to mail it to her.  I also called and left a message for [facility] so she would be aware.

## 2019-06-08 NOTE — Telephone Encounter (Signed)
VM message left for Tiffany at [place] requesting [place] date. Hubbard Hartshorn, RN, BSN

## 2019-06-09 DIAGNOSIS — Z7982 Long term (current) use of aspirin: Secondary | ICD-10-CM | POA: Diagnosis not present

## 2019-06-09 DIAGNOSIS — Z9181 History of falling: Secondary | ICD-10-CM | POA: Diagnosis not present

## 2019-06-09 DIAGNOSIS — D510 Vitamin B12 deficiency anemia due to intrinsic factor deficiency: Secondary | ICD-10-CM | POA: Diagnosis not present

## 2019-06-09 DIAGNOSIS — K828 Other specified diseases of gallbladder: Secondary | ICD-10-CM | POA: Diagnosis not present

## 2019-06-09 DIAGNOSIS — N183 Chronic kidney disease, stage 3 (moderate): Secondary | ICD-10-CM | POA: Diagnosis not present

## 2019-06-09 DIAGNOSIS — K219 Gastro-esophageal reflux disease without esophagitis: Secondary | ICD-10-CM | POA: Diagnosis not present

## 2019-06-09 DIAGNOSIS — M810 Age-related osteoporosis without current pathological fracture: Secondary | ICD-10-CM | POA: Diagnosis not present

## 2019-06-09 DIAGNOSIS — H47013 Ischemic optic neuropathy, bilateral: Secondary | ICD-10-CM | POA: Diagnosis not present

## 2019-06-09 DIAGNOSIS — I129 Hypertensive chronic kidney disease with stage 1 through stage 4 chronic kidney disease, or unspecified chronic kidney disease: Secondary | ICD-10-CM | POA: Diagnosis not present

## 2019-06-09 DIAGNOSIS — G809 Cerebral palsy, unspecified: Secondary | ICD-10-CM | POA: Diagnosis not present

## 2019-06-09 DIAGNOSIS — G894 Chronic pain syndrome: Secondary | ICD-10-CM | POA: Diagnosis not present

## 2019-06-09 DIAGNOSIS — M7541 Impingement syndrome of right shoulder: Secondary | ICD-10-CM | POA: Diagnosis not present

## 2019-06-09 DIAGNOSIS — E785 Hyperlipidemia, unspecified: Secondary | ICD-10-CM | POA: Diagnosis not present

## 2019-06-09 DIAGNOSIS — Z8619 Personal history of other infectious and parasitic diseases: Secondary | ICD-10-CM | POA: Diagnosis not present

## 2019-06-10 ENCOUNTER — Telehealth: Payer: Self-pay | Admitting: *Deleted

## 2019-06-10 NOTE — Telephone Encounter (Signed)
Received call from April, RN with [facility] requesting VO for [place] 1 week 9. Will route to PCP for agreement/denial. States patient had not yet picked up Vit B12 but will have it by Monday 06/13/2019. April will administer injection then and collect stool sample. Requesting an order to draw Vit B12 level prior to 4th dose be faxed to [facility] at [phone]. Hubbard Hartshorn, RN, BSN

## 2019-06-10 NOTE — Telephone Encounter (Signed)
Agree with the for [facility] nursing and B12 level prior to fourth dose.  Thank you

## 2019-06-10 NOTE — Telephone Encounter (Signed)
Tiffany left VM that [facility] for [facility] RN was yesterday, 06/09/2019 and [facility] PT is today, 06/10/2019. Tiffany will ensure samples were obtained. Hubbard Hartshorn, RN, BSN

## 2019-06-14 DIAGNOSIS — G894 Chronic pain syndrome: Secondary | ICD-10-CM | POA: Diagnosis not present

## 2019-06-14 DIAGNOSIS — M810 Age-related osteoporosis without current pathological fracture: Secondary | ICD-10-CM | POA: Diagnosis not present

## 2019-06-14 DIAGNOSIS — E785 Hyperlipidemia, unspecified: Secondary | ICD-10-CM | POA: Diagnosis not present

## 2019-06-14 DIAGNOSIS — I129 Hypertensive chronic kidney disease with stage 1 through stage 4 chronic kidney disease, or unspecified chronic kidney disease: Secondary | ICD-10-CM | POA: Diagnosis not present

## 2019-06-14 DIAGNOSIS — Z9181 History of falling: Secondary | ICD-10-CM | POA: Diagnosis not present

## 2019-06-14 DIAGNOSIS — G809 Cerebral palsy, unspecified: Secondary | ICD-10-CM | POA: Diagnosis not present

## 2019-06-14 DIAGNOSIS — K828 Other specified diseases of gallbladder: Secondary | ICD-10-CM | POA: Diagnosis not present

## 2019-06-14 DIAGNOSIS — M7541 Impingement syndrome of right shoulder: Secondary | ICD-10-CM | POA: Diagnosis not present

## 2019-06-14 DIAGNOSIS — D510 Vitamin B12 deficiency anemia due to intrinsic factor deficiency: Secondary | ICD-10-CM | POA: Diagnosis not present

## 2019-06-14 DIAGNOSIS — N183 Chronic kidney disease, stage 3 (moderate): Secondary | ICD-10-CM | POA: Diagnosis not present

## 2019-06-14 DIAGNOSIS — Z8619 Personal history of other infectious and parasitic diseases: Secondary | ICD-10-CM | POA: Diagnosis not present

## 2019-06-14 DIAGNOSIS — H47013 Ischemic optic neuropathy, bilateral: Secondary | ICD-10-CM | POA: Diagnosis not present

## 2019-06-14 DIAGNOSIS — Z7982 Long term (current) use of aspirin: Secondary | ICD-10-CM | POA: Diagnosis not present

## 2019-06-14 DIAGNOSIS — K219 Gastro-esophageal reflux disease without esophagitis: Secondary | ICD-10-CM | POA: Diagnosis not present

## 2019-06-15 ENCOUNTER — Telehealth: Payer: Self-pay

## 2019-06-15 NOTE — Telephone Encounter (Signed)
Requesting to speak with a nurse about B12. Please call back.

## 2019-06-15 NOTE — Telephone Encounter (Signed)
Patient notified and states she will take both at the same time. She was very Patent attorney. Hubbard Hartshorn, RN, BSN

## 2019-06-15 NOTE — Telephone Encounter (Signed)
Returned call to patient. No answer. Left message on VM requesting return call. L. [place], RN, BSN     

## 2019-06-15 NOTE — Telephone Encounter (Signed)
Patient returned call asking if she should be taking Vit B12 1000 mcg PO at same time she is receiving weekly Vit B12 injections or if she should wait to begin oral Vit B12 till after weekly injections are d/c. Hubbard Hartshorn, RN, BSN

## 2019-06-15 NOTE — Telephone Encounter (Signed)
Either is okay.  She cannot overdose if she takes both at the same time.  It simply is up to her

## 2019-06-20 ENCOUNTER — Encounter: Payer: Self-pay | Admitting: *Deleted

## 2019-06-20 ENCOUNTER — Telehealth: Payer: Self-pay

## 2019-06-20 DIAGNOSIS — Z9181 History of falling: Secondary | ICD-10-CM | POA: Diagnosis not present

## 2019-06-20 DIAGNOSIS — M7541 Impingement syndrome of right shoulder: Secondary | ICD-10-CM | POA: Diagnosis not present

## 2019-06-20 DIAGNOSIS — I129 Hypertensive chronic kidney disease with stage 1 through stage 4 chronic kidney disease, or unspecified chronic kidney disease: Secondary | ICD-10-CM | POA: Diagnosis not present

## 2019-06-20 DIAGNOSIS — D510 Vitamin B12 deficiency anemia due to intrinsic factor deficiency: Secondary | ICD-10-CM | POA: Diagnosis not present

## 2019-06-20 DIAGNOSIS — H47013 Ischemic optic neuropathy, bilateral: Secondary | ICD-10-CM | POA: Diagnosis not present

## 2019-06-20 DIAGNOSIS — M810 Age-related osteoporosis without current pathological fracture: Secondary | ICD-10-CM | POA: Diagnosis not present

## 2019-06-20 DIAGNOSIS — E538 Deficiency of other specified B group vitamins: Secondary | ICD-10-CM

## 2019-06-20 DIAGNOSIS — Z7982 Long term (current) use of aspirin: Secondary | ICD-10-CM | POA: Diagnosis not present

## 2019-06-20 DIAGNOSIS — K219 Gastro-esophageal reflux disease without esophagitis: Secondary | ICD-10-CM | POA: Diagnosis not present

## 2019-06-20 DIAGNOSIS — G894 Chronic pain syndrome: Secondary | ICD-10-CM | POA: Diagnosis not present

## 2019-06-20 DIAGNOSIS — E785 Hyperlipidemia, unspecified: Secondary | ICD-10-CM | POA: Diagnosis not present

## 2019-06-20 DIAGNOSIS — N183 Chronic kidney disease, stage 3 (moderate): Secondary | ICD-10-CM | POA: Diagnosis not present

## 2019-06-20 DIAGNOSIS — G809 Cerebral palsy, unspecified: Secondary | ICD-10-CM | POA: Diagnosis not present

## 2019-06-20 DIAGNOSIS — Z8619 Personal history of other infectious and parasitic diseases: Secondary | ICD-10-CM | POA: Diagnosis not present

## 2019-06-20 DIAGNOSIS — K828 Other specified diseases of gallbladder: Secondary | ICD-10-CM | POA: Diagnosis not present

## 2019-06-20 HISTORY — DX: Deficiency of other specified B group vitamins: E53.8

## 2019-06-20 NOTE — Telephone Encounter (Signed)
Melissa with [facility] requesting to speak with a nurse to verified Dx and copies of lab. Please call back.

## 2019-06-20 NOTE — Telephone Encounter (Signed)
[facility] calls for diag code for B12, added to problem list, do you agree?

## 2019-06-28 ENCOUNTER — Other Ambulatory Visit: Payer: Self-pay | Admitting: Internal Medicine

## 2019-06-28 NOTE — Telephone Encounter (Signed)
Got #60 one month ago. Should NOT be taking at this freq due to renal dysfxn

## 2019-06-28 NOTE — Telephone Encounter (Signed)
yes

## 2019-06-29 DIAGNOSIS — G894 Chronic pain syndrome: Secondary | ICD-10-CM | POA: Diagnosis not present

## 2019-06-29 DIAGNOSIS — E785 Hyperlipidemia, unspecified: Secondary | ICD-10-CM | POA: Diagnosis not present

## 2019-06-29 DIAGNOSIS — K219 Gastro-esophageal reflux disease without esophagitis: Secondary | ICD-10-CM | POA: Diagnosis not present

## 2019-06-29 DIAGNOSIS — G809 Cerebral palsy, unspecified: Secondary | ICD-10-CM | POA: Diagnosis not present

## 2019-06-29 DIAGNOSIS — H47013 Ischemic optic neuropathy, bilateral: Secondary | ICD-10-CM | POA: Diagnosis not present

## 2019-06-29 DIAGNOSIS — M7541 Impingement syndrome of right shoulder: Secondary | ICD-10-CM | POA: Diagnosis not present

## 2019-06-29 DIAGNOSIS — Z7982 Long term (current) use of aspirin: Secondary | ICD-10-CM | POA: Diagnosis not present

## 2019-06-29 DIAGNOSIS — Z8619 Personal history of other infectious and parasitic diseases: Secondary | ICD-10-CM | POA: Diagnosis not present

## 2019-06-29 DIAGNOSIS — M810 Age-related osteoporosis without current pathological fracture: Secondary | ICD-10-CM | POA: Diagnosis not present

## 2019-06-29 DIAGNOSIS — Z9181 History of falling: Secondary | ICD-10-CM | POA: Diagnosis not present

## 2019-06-29 DIAGNOSIS — I129 Hypertensive chronic kidney disease with stage 1 through stage 4 chronic kidney disease, or unspecified chronic kidney disease: Secondary | ICD-10-CM | POA: Diagnosis not present

## 2019-06-29 DIAGNOSIS — K828 Other specified diseases of gallbladder: Secondary | ICD-10-CM | POA: Diagnosis not present

## 2019-06-29 DIAGNOSIS — N183 Chronic kidney disease, stage 3 (moderate): Secondary | ICD-10-CM | POA: Diagnosis not present

## 2019-06-29 DIAGNOSIS — D510 Vitamin B12 deficiency anemia due to intrinsic factor deficiency: Secondary | ICD-10-CM | POA: Diagnosis not present

## 2019-07-06 DIAGNOSIS — Z9181 History of falling: Secondary | ICD-10-CM | POA: Diagnosis not present

## 2019-07-06 DIAGNOSIS — K219 Gastro-esophageal reflux disease without esophagitis: Secondary | ICD-10-CM | POA: Diagnosis not present

## 2019-07-06 DIAGNOSIS — I129 Hypertensive chronic kidney disease with stage 1 through stage 4 chronic kidney disease, or unspecified chronic kidney disease: Secondary | ICD-10-CM | POA: Diagnosis not present

## 2019-07-06 DIAGNOSIS — H47013 Ischemic optic neuropathy, bilateral: Secondary | ICD-10-CM | POA: Diagnosis not present

## 2019-07-06 DIAGNOSIS — G894 Chronic pain syndrome: Secondary | ICD-10-CM | POA: Diagnosis not present

## 2019-07-06 DIAGNOSIS — M810 Age-related osteoporosis without current pathological fracture: Secondary | ICD-10-CM | POA: Diagnosis not present

## 2019-07-06 DIAGNOSIS — D510 Vitamin B12 deficiency anemia due to intrinsic factor deficiency: Secondary | ICD-10-CM | POA: Diagnosis not present

## 2019-07-06 DIAGNOSIS — M7541 Impingement syndrome of right shoulder: Secondary | ICD-10-CM | POA: Diagnosis not present

## 2019-07-06 DIAGNOSIS — E785 Hyperlipidemia, unspecified: Secondary | ICD-10-CM | POA: Diagnosis not present

## 2019-07-06 DIAGNOSIS — N183 Chronic kidney disease, stage 3 (moderate): Secondary | ICD-10-CM | POA: Diagnosis not present

## 2019-07-06 DIAGNOSIS — G809 Cerebral palsy, unspecified: Secondary | ICD-10-CM | POA: Diagnosis not present

## 2019-07-06 DIAGNOSIS — Z7982 Long term (current) use of aspirin: Secondary | ICD-10-CM | POA: Diagnosis not present

## 2019-07-06 DIAGNOSIS — Z8619 Personal history of other infectious and parasitic diseases: Secondary | ICD-10-CM | POA: Diagnosis not present

## 2019-07-06 DIAGNOSIS — K828 Other specified diseases of gallbladder: Secondary | ICD-10-CM | POA: Diagnosis not present

## 2019-07-07 ENCOUNTER — Telehealth: Payer: Self-pay | Admitting: Internal Medicine

## 2019-07-07 NOTE — Telephone Encounter (Signed)
Pt called back with the ph#, called the # and it was someone's home [phone], ask pt not to give any personal info out if notified again. She is agreeable.

## 2019-07-07 NOTE — Telephone Encounter (Signed)
Pt calls and states she got a call from "a remote company that works with dr Software engineer so you wont need to see her anymore" she is worried that dr Software engineer doe not want to see her anymore and the staff are mad at her. She is reassured that she is not being dismissed and no one is mad at her. Ask for the name or ph# of who called her and she states she will have to get her sister to help her find the # in her ph

## 2019-07-13 DIAGNOSIS — D510 Vitamin B12 deficiency anemia due to intrinsic factor deficiency: Secondary | ICD-10-CM | POA: Diagnosis not present

## 2019-07-13 DIAGNOSIS — M7541 Impingement syndrome of right shoulder: Secondary | ICD-10-CM | POA: Diagnosis not present

## 2019-07-13 DIAGNOSIS — N183 Chronic kidney disease, stage 3 (moderate): Secondary | ICD-10-CM | POA: Diagnosis not present

## 2019-07-13 DIAGNOSIS — H47013 Ischemic optic neuropathy, bilateral: Secondary | ICD-10-CM | POA: Diagnosis not present

## 2019-07-13 DIAGNOSIS — Z9181 History of falling: Secondary | ICD-10-CM | POA: Diagnosis not present

## 2019-07-13 DIAGNOSIS — K828 Other specified diseases of gallbladder: Secondary | ICD-10-CM | POA: Diagnosis not present

## 2019-07-13 DIAGNOSIS — K219 Gastro-esophageal reflux disease without esophagitis: Secondary | ICD-10-CM | POA: Diagnosis not present

## 2019-07-13 DIAGNOSIS — G894 Chronic pain syndrome: Secondary | ICD-10-CM | POA: Diagnosis not present

## 2019-07-13 DIAGNOSIS — Z7982 Long term (current) use of aspirin: Secondary | ICD-10-CM | POA: Diagnosis not present

## 2019-07-13 DIAGNOSIS — Z8619 Personal history of other infectious and parasitic diseases: Secondary | ICD-10-CM | POA: Diagnosis not present

## 2019-07-13 DIAGNOSIS — G809 Cerebral palsy, unspecified: Secondary | ICD-10-CM | POA: Diagnosis not present

## 2019-07-13 DIAGNOSIS — E785 Hyperlipidemia, unspecified: Secondary | ICD-10-CM | POA: Diagnosis not present

## 2019-07-13 DIAGNOSIS — M810 Age-related osteoporosis without current pathological fracture: Secondary | ICD-10-CM | POA: Diagnosis not present

## 2019-07-13 DIAGNOSIS — I129 Hypertensive chronic kidney disease with stage 1 through stage 4 chronic kidney disease, or unspecified chronic kidney disease: Secondary | ICD-10-CM | POA: Diagnosis not present

## 2019-07-20 DIAGNOSIS — D510 Vitamin B12 deficiency anemia due to intrinsic factor deficiency: Secondary | ICD-10-CM | POA: Diagnosis not present

## 2019-07-20 DIAGNOSIS — N183 Chronic kidney disease, stage 3 (moderate): Secondary | ICD-10-CM | POA: Diagnosis not present

## 2019-07-20 DIAGNOSIS — Z9181 History of falling: Secondary | ICD-10-CM | POA: Diagnosis not present

## 2019-07-20 DIAGNOSIS — I129 Hypertensive chronic kidney disease with stage 1 through stage 4 chronic kidney disease, or unspecified chronic kidney disease: Secondary | ICD-10-CM | POA: Diagnosis not present

## 2019-07-20 DIAGNOSIS — M7541 Impingement syndrome of right shoulder: Secondary | ICD-10-CM | POA: Diagnosis not present

## 2019-07-20 DIAGNOSIS — Z7982 Long term (current) use of aspirin: Secondary | ICD-10-CM | POA: Diagnosis not present

## 2019-07-20 DIAGNOSIS — H47013 Ischemic optic neuropathy, bilateral: Secondary | ICD-10-CM | POA: Diagnosis not present

## 2019-07-20 DIAGNOSIS — E785 Hyperlipidemia, unspecified: Secondary | ICD-10-CM | POA: Diagnosis not present

## 2019-07-20 DIAGNOSIS — K219 Gastro-esophageal reflux disease without esophagitis: Secondary | ICD-10-CM | POA: Diagnosis not present

## 2019-07-20 DIAGNOSIS — G894 Chronic pain syndrome: Secondary | ICD-10-CM | POA: Diagnosis not present

## 2019-07-20 DIAGNOSIS — Z8619 Personal history of other infectious and parasitic diseases: Secondary | ICD-10-CM | POA: Diagnosis not present

## 2019-07-20 DIAGNOSIS — K828 Other specified diseases of gallbladder: Secondary | ICD-10-CM | POA: Diagnosis not present

## 2019-07-20 DIAGNOSIS — M810 Age-related osteoporosis without current pathological fracture: Secondary | ICD-10-CM | POA: Diagnosis not present

## 2019-07-20 DIAGNOSIS — G809 Cerebral palsy, unspecified: Secondary | ICD-10-CM | POA: Diagnosis not present

## 2019-07-29 ENCOUNTER — Other Ambulatory Visit: Payer: Self-pay | Admitting: Internal Medicine

## 2019-07-29 DIAGNOSIS — D510 Vitamin B12 deficiency anemia due to intrinsic factor deficiency: Secondary | ICD-10-CM | POA: Diagnosis not present

## 2019-07-29 DIAGNOSIS — Z8619 Personal history of other infectious and parasitic diseases: Secondary | ICD-10-CM | POA: Diagnosis not present

## 2019-07-29 DIAGNOSIS — K828 Other specified diseases of gallbladder: Secondary | ICD-10-CM | POA: Diagnosis not present

## 2019-07-29 DIAGNOSIS — H47013 Ischemic optic neuropathy, bilateral: Secondary | ICD-10-CM | POA: Diagnosis not present

## 2019-07-29 DIAGNOSIS — M7541 Impingement syndrome of right shoulder: Secondary | ICD-10-CM | POA: Diagnosis not present

## 2019-07-29 DIAGNOSIS — M810 Age-related osteoporosis without current pathological fracture: Secondary | ICD-10-CM | POA: Diagnosis not present

## 2019-07-29 DIAGNOSIS — R42 Dizziness and giddiness: Secondary | ICD-10-CM

## 2019-07-29 DIAGNOSIS — I129 Hypertensive chronic kidney disease with stage 1 through stage 4 chronic kidney disease, or unspecified chronic kidney disease: Secondary | ICD-10-CM | POA: Diagnosis not present

## 2019-07-29 DIAGNOSIS — Z7982 Long term (current) use of aspirin: Secondary | ICD-10-CM | POA: Diagnosis not present

## 2019-07-29 DIAGNOSIS — G894 Chronic pain syndrome: Secondary | ICD-10-CM | POA: Diagnosis not present

## 2019-07-29 DIAGNOSIS — N183 Chronic kidney disease, stage 3 unspecified: Secondary | ICD-10-CM | POA: Diagnosis not present

## 2019-07-29 DIAGNOSIS — E785 Hyperlipidemia, unspecified: Secondary | ICD-10-CM | POA: Diagnosis not present

## 2019-07-29 DIAGNOSIS — K219 Gastro-esophageal reflux disease without esophagitis: Secondary | ICD-10-CM | POA: Diagnosis not present

## 2019-07-29 DIAGNOSIS — G809 Cerebral palsy, unspecified: Secondary | ICD-10-CM | POA: Diagnosis not present

## 2019-07-29 DIAGNOSIS — Z9181 History of falling: Secondary | ICD-10-CM | POA: Diagnosis not present

## 2019-08-31 ENCOUNTER — Other Ambulatory Visit: Payer: Self-pay | Admitting: Internal Medicine

## 2019-08-31 MED ORDER — HYDROCODONE-ACETAMINOPHEN 5-325 MG PO TABS: 1.0000 | ORAL_TABLET | Freq: Four times a day (QID) | ORAL | 0 refills | Status: DC | PRN

## 2019-09-01 ENCOUNTER — Ambulatory Visit (INDEPENDENT_AMBULATORY_CARE_PROVIDER_SITE_OTHER): Payer: Medicare Other | Admitting: Internal Medicine

## 2019-09-01 ENCOUNTER — Other Ambulatory Visit: Payer: Self-pay

## 2019-09-01 DIAGNOSIS — K219 Gastro-esophageal reflux disease without esophagitis: Secondary | ICD-10-CM

## 2019-09-01 DIAGNOSIS — G8929 Other chronic pain: Secondary | ICD-10-CM | POA: Diagnosis not present

## 2019-09-01 DIAGNOSIS — K529 Noninfective gastroenteritis and colitis, unspecified: Secondary | ICD-10-CM

## 2019-09-01 DIAGNOSIS — M810 Age-related osteoporosis without current pathological fracture: Secondary | ICD-10-CM | POA: Diagnosis not present

## 2019-09-01 DIAGNOSIS — E7800 Pure hypercholesterolemia, unspecified: Secondary | ICD-10-CM

## 2019-09-01 DIAGNOSIS — Z79891 Long term (current) use of opiate analgesic: Secondary | ICD-10-CM

## 2019-09-01 DIAGNOSIS — Z8619 Personal history of other infectious and parasitic diseases: Secondary | ICD-10-CM

## 2019-09-01 DIAGNOSIS — Z7983 Long term (current) use of bisphosphonates: Secondary | ICD-10-CM

## 2019-09-01 DIAGNOSIS — I129 Hypertensive chronic kidney disease with stage 1 through stage 4 chronic kidney disease, or unspecified chronic kidney disease: Secondary | ICD-10-CM

## 2019-09-01 DIAGNOSIS — I10 Essential (primary) hypertension: Secondary | ICD-10-CM

## 2019-09-01 DIAGNOSIS — M549 Dorsalgia, unspecified: Secondary | ICD-10-CM

## 2019-09-01 DIAGNOSIS — R269 Unspecified abnormalities of gait and mobility: Secondary | ICD-10-CM

## 2019-09-01 DIAGNOSIS — K828 Other specified diseases of gallbladder: Secondary | ICD-10-CM | POA: Diagnosis not present

## 2019-09-01 DIAGNOSIS — N183 Chronic kidney disease, stage 3 unspecified: Secondary | ICD-10-CM

## 2019-09-01 DIAGNOSIS — Z79899 Other long term (current) drug therapy: Secondary | ICD-10-CM

## 2019-09-01 DIAGNOSIS — G894 Chronic pain syndrome: Secondary | ICD-10-CM

## 2019-09-01 DIAGNOSIS — Z8719 Personal history of other diseases of the digestive system: Secondary | ICD-10-CM

## 2019-09-01 DIAGNOSIS — R152 Fecal urgency: Secondary | ICD-10-CM

## 2019-09-01 DIAGNOSIS — E538 Deficiency of other specified B group vitamins: Secondary | ICD-10-CM

## 2019-09-01 MED ORDER — FAMOTIDINE 10 MG PO TABS: 10.0000 mg | ORAL_TABLET | Freq: Two times a day (BID) | ORAL | 3 refills | Status: DC

## 2019-09-01 MED ORDER — TRIAMTERENE-HCTZ 37.5-25 MG PO TABS: 1.0000 | ORAL_TABLET | Freq: Every day | ORAL | 3 refills | Status: DC

## 2019-09-01 NOTE — Assessment & Plan Note (Addendum)
This problem is chronic and well controlled.  She is on hydrocodone and takes 4 pills a day.  There has been no orange flag or red flag behavior surrounding her opioids.  She elected a few years ago to stop her opioids but then her pain became unbearable and we resumed it.  Her last UDS was in 2017 and was appropriate.  I reviewed her [place] narcotic database and it was appropriate.  I see no reason to bring her into the clinic just to repeat a UDS as my pretest probability of an abnormal result is extremely low.  PLAN : Continue hydrocodone qid Refills were sent yesterday Follow-up telehealth appointment in February

## 2019-09-01 NOTE — Addendum Note (Signed)
Addended by: Larey Dresser A on: 09/01/2019 02:33 PM   Modules accepted: Orders

## 2019-09-01 NOTE — Assessment & Plan Note (Signed)
This problem is chronic and stable.  She is on atorvastatin 10 mg, and a moderate intensity statin, for primary prevention.  She has no side effects to this medication.  PLAN:  Cont current meds

## 2019-09-01 NOTE — Assessment & Plan Note (Signed)
This problem is chronic and controlled.  She is on famotidine and had no symptoms today.  PLAN:  Cont current meds

## 2019-09-01 NOTE — Assessment & Plan Note (Addendum)
Her diarrhea is chronic and uncontrolled.  She had C. difficile which was diagnosed in January 2020 and was treated with vancomycin.,  She continues to have fecal urgency.  She has 1-4 bowel movements a day which are painless.  She endorses some formed stools but also describes loose stools and liquid stools.  While I was on the phone for her, she had to excuse herself to go to the toilet due to the diarrhea.  At her last appointment, which was not person, I got a sed rate and CRP which were normal which makes ongoing C. difficile and IBD less likely.  However, I will did a stool sample as that is the only way to rule out or rule in C. difficile.  She was unable to supply a stool sample and had no transportation to get it into the clinic.  She also mentions that chocolate and milk make the diarrhea much worse.  I had thought this might be a postinfectious lactose deficiency but she states that this has been going on off.  She said yogurt do not exacerbate her diarrhea.  She states her appetite is okay but that she does not have a scale and does not know if she is losing weight.  When asked, she does endorse that her clothes are getting too big.  She is hesitant to come in to the due to coronavirus.  I will see if remote health is able to go to her house.  If she has C. difficile, she will need antibiotic treatment.  If her C. difficile is negative, the next step would be a GI consult for colonoscopy with random biopsies to assess for microscopic colitis (NSAID & statin), IBD, or other etiologies. IBS, particularly postinfectious IBS, could be a possibility although she does not describe classic pain relieved with defecation.  She has evidence of malabsorption with B12 deficiency but Celiac disease typically it does not have such an acute onset.  I doubt her symptoms are simply postinfectious lactose intolerance as her symptoms predate her infection.   PLAN : Investigate ways to get a stool sample to the clinic

## 2019-09-01 NOTE — Assessment & Plan Note (Signed)
This problem is chronic and stable.  Her last creatinine was in August and fell within her baseline range.  If she is losing weight and or becoming volume contracted, she could have worsening of her renal function so if able, I will get a CMP at a home visit.  PLAN : CMP in home

## 2019-09-01 NOTE — Assessment & Plan Note (Signed)
This problem is chronic and stable.  This was diagnosed in 2020 during work-up for chronic diarrhea.  She received 3 weekly B12 injections and is now taking oral B12.  I will see if we can do a B12 level from her home.  PLAN : Continue oral B12 Investigate checking B12 levels at home

## 2019-09-01 NOTE — Assessment & Plan Note (Signed)
This problem is chronic and has been well controlled.  She is on Maxide at home and her blood pressure has always been well controlled.  She has no side effects to this medication.  PLAN:  Cont current meds  BP Readings from Last 3 Encounters:  05/26/19 137/78  11/22/18 (!) 164/72  11/15/18 (!) 154/88

## 2019-09-01 NOTE — Assessment & Plan Note (Signed)
This problem is chronic and stable.  She is on alendronate weekly for her osteoporosis.  She will need a holiday in 2022 unless the benefits outweigh the risk of continuing a bisphosphonate.  PLAN:  Cont current meds

## 2019-09-01 NOTE — Progress Notes (Signed)
   Subjective:    Patient ID: Kathy Owens, female    DOB: [birth year], 63 y.o.   MRN: [id number]  HPI   [place] only   This is a telephone encounter between Kathy Owens and Larey Dresser on 09/01/2019 for chronic pain F/U. The visit was conducted with the patient located at home and Larey Dresser at [facility]. The patient's identity was confirmed using their DOB and current address. The patient has consented to being evaluated through a telephone encounter and understands the associated risks (an examination cannot be done and the patient may need to come in for an appointment) / benefits (allows the patient to remain at home, decreasing exposure to coronavirus). I personally spent 18 minutes on medical discussion.   Kathy Owens is here for chronic pain F/U. Please see the A&P for the status of the pt's chronic medical problems.  ROS : per ROS section and in problem oriented charting. All other systems are negative.  PMHx, Soc hx, and / or Fam hx : Kathy Owens states with her for a week at a time but then returns to her apartment.   Kathy Owens enjoys watching TV, particularly in the old shows like MASH and [place]  Review of Systems  Constitutional: Negative for appetite change.       Unknown weight loss as she does not have a scale but her close are becoming too big  Gastrointestinal: Positive for diarrhea. Negative for abdominal pain.       Fecal urgency  Musculoskeletal: Positive for back pain and gait problem.  Psychiatric/Behavioral: Negative for sleep disturbance.       Feels lonely when her sister is not present      Objective:   Physical Exam  Telehealth, audio only      Assessment & Plan:

## 2019-09-01 NOTE — Assessment & Plan Note (Addendum)
This problem is chronic and has not been investigated.  Apparently new expert opinion indicates that it it is currently intramural calcification of the radiologist indicated calcification within only a portion of the gallbladder wall which would be less concerned.  Regardless, I would like to at least get the opinion of the surgeon and she is in agreement to see a surgeon if it can be done via telehealth.  We will investigate if this can be set up.  PLAN : Investigate telehealth surgery appointment

## 2019-09-05 ENCOUNTER — Other Ambulatory Visit: Payer: Self-pay | Admitting: *Deleted

## 2019-09-05 NOTE — Patient Outreach (Signed)
[facility]) Care Management  09/05/2019  Kathy Owens [birth year] [id number]   Referral Date: 09/01/2019 Referral Source: MD office Referral Reason: Can [facility] assist with the following?  In-home stool collection for C. difficile  In-home phlebotomy for CMP, vitamin B12 level, CBC  In-home blood pressure check  Insurance: [place] attempt #1, successful.  Social: Lives alone but report having family/friends in the area and available.  Report being independent with ADLs/IADLs, uses walker.  Denies any falls over the last 3 months.  Conditions: History of HTN, GErD, [place] gallbladder, cerebral palsy, hyperlipidemia, and chronic renal insufficiency.  Medications: Report ability to manage independently, denies questions regarding use, denies inability to afford.  Appointments: Last televisit with PCP on 11/12, MD requested to have NP from [facility] visit in the home for assessment and labs. Friend or sister provide transportation to MD visits when needed, also run errands for member.  Assessment complete, report her biggest concern at this time is recurrent diarrhea.  She has had C-diff in the past and MD concerned she is positive again.  She is open to having visiting nurse for labs and to develop plan of care with PCP.  Denies any complex/coordination needs but open to remaining active with [facility] for education of HTN.  Does not have machine, state she is unsure how to use one if she had one.  Advised to purchase one (have sister or friend get it from store) and will request remote health nurse provide education on use during visit.  Plan: RN CM will place referral to [facility] per MD request and to health coach for disease management.  Valente David, [place], MSN [employer] [phone]

## 2019-09-06 ENCOUNTER — Other Ambulatory Visit: Payer: Self-pay | Admitting: *Deleted

## 2019-09-09 DIAGNOSIS — G809 Cerebral palsy, unspecified: Secondary | ICD-10-CM | POA: Diagnosis not present

## 2019-09-09 DIAGNOSIS — Z8619 Personal history of other infectious and parasitic diseases: Secondary | ICD-10-CM | POA: Diagnosis not present

## 2019-09-09 DIAGNOSIS — N183 Chronic kidney disease, stage 3 unspecified: Secondary | ICD-10-CM | POA: Diagnosis not present

## 2019-09-09 DIAGNOSIS — E876 Hypokalemia: Secondary | ICD-10-CM | POA: Diagnosis not present

## 2019-09-09 DIAGNOSIS — D519 Vitamin B12 deficiency anemia, unspecified: Secondary | ICD-10-CM | POA: Diagnosis not present

## 2019-09-09 DIAGNOSIS — Z7689 Persons encountering health services in other specified circumstances: Secondary | ICD-10-CM | POA: Diagnosis not present

## 2019-09-09 LAB — COMPREHENSIVE METABOLIC PANEL
Albumin: 4.9 (ref 3.5–5.0)
Calcium: 9.6 (ref 8.7–10.7)

## 2019-09-09 LAB — BASIC METABOLIC PANEL
BUN: 21 (ref 4–21)
CO2: 18 (ref 13–22)
Chloride: 98 — AB (ref 99–108)
Creatinine: 1.1 (ref 0.5–1.1)
Glucose: 88
Potassium: 3.5 (ref 3.4–5.3)
Sodium: 139 (ref 137–147)

## 2019-09-09 LAB — VITAMIN B12: Vitamin B-12: 1982

## 2019-09-09 LAB — CBC AND DIFFERENTIAL
Hemoglobin: 13.7 (ref 12.0–16.0)
Platelets: 259 (ref 150–399)
WBC: 6.7

## 2019-09-12 ENCOUNTER — Encounter: Payer: Self-pay | Admitting: Internal Medicine

## 2019-09-14 DIAGNOSIS — A0472 Enterocolitis due to Clostridium difficile, not specified as recurrent: Secondary | ICD-10-CM | POA: Diagnosis not present

## 2019-09-19 ENCOUNTER — Telehealth: Payer: Self-pay | Admitting: Internal Medicine

## 2019-09-19 NOTE — Telephone Encounter (Signed)
Kathy Owens, can you help with the results either sending a message to remote health or calling [facility] triage, thanks

## 2019-09-19 NOTE — Telephone Encounter (Signed)
Pt calling back about her stool sample Results.

## 2019-09-19 NOTE — Telephone Encounter (Signed)
I have not received any results. Will need to check with [facility]

## 2019-09-20 NOTE — Telephone Encounter (Signed)
Called pt to reassure and give advice from dr Software engineer, stay away from dairy products and will do referral to GI, dr Software engineer will f/u ph call after reviewing notes and labs again

## 2019-09-20 NOTE — Telephone Encounter (Signed)
Rec phone call from [place] following up.  Per [place]  pt results were already faxed on 09/19/2019 and 09/20/2019 from [facility]. Please call back if any questions.

## 2019-09-20 NOTE — Telephone Encounter (Signed)
Pt rrtc, discussed as dr Software engineer requested, pt states she is doing better but she will follow dr butcher's suggestion. Wants to wish dr Software engineer and the staff [employer]

## 2019-09-30 ENCOUNTER — Other Ambulatory Visit: Payer: Self-pay

## 2019-09-30 ENCOUNTER — Ambulatory Visit (INDEPENDENT_AMBULATORY_CARE_PROVIDER_SITE_OTHER): Payer: Medicare Other | Admitting: Internal Medicine

## 2019-09-30 ENCOUNTER — Telehealth: Payer: Self-pay | Admitting: *Deleted

## 2019-09-30 DIAGNOSIS — Z8619 Personal history of other infectious and parasitic diseases: Secondary | ICD-10-CM | POA: Diagnosis not present

## 2019-09-30 DIAGNOSIS — K529 Noninfective gastroenteritis and colitis, unspecified: Secondary | ICD-10-CM | POA: Diagnosis not present

## 2019-09-30 DIAGNOSIS — E538 Deficiency of other specified B group vitamins: Secondary | ICD-10-CM | POA: Diagnosis not present

## 2019-09-30 MED ORDER — LOPERAMIDE HCL 2 MG PO TABS: 2.0000 mg | ORAL_TABLET | Freq: Four times a day (QID) | ORAL | 0 refills | Status: DC | PRN
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
## 2019-09-30 NOTE — Telephone Encounter (Signed)
Call from pt - stated she ate chicken salad yesterday - had 1 episode of vomiting and having watery diarrhea but no abd pain. She wondering if it's food poisoning. Stated she prefers not coming to the hospital.  Telehealth appt scheduled today - Dr Maricela Bo informed. Pt's telephone # [phone].

## 2019-10-02 NOTE — Assessment & Plan Note (Signed)
Patient called with concern for worsening diarrhea over the past 3 days and one episode of vomiting 3 days ago. She states that her stools have been more watery in nature. She has been having 4-5 episodes daily of a moderate amount. The patient states that she ate chicken salad three days ago prior to the diarrhea beginning.   The patient has a history of chronic diarrhea of unknown cause. Initially, the patient was diagnosed with c difficile in December 2019. However, despite treatment with po vancomycin and fidaxomycin the patient's diarrhea never truly resolved. A repeat c dificile test was done in [facility] 2020 which returned negative for c diff and her most recent esr and crp were within normal range.   At last appointment Dr. Lynnae January (the patient's pcp) had planned on consulting GI for colonoscopy and biopsy to assess for microscopic colitis, ibd. The patient has vitamin b12 deficiency which make malabsorption a possible cause of the patient's diarrhea as well.   Assessment and plan  Prescribed  Loperamide 30m qid prn after confirming that there is low probability that this patient has c difficile. Placed GI referral for further evaluation of inflammatory vs postinfectious vs malabsorption as a cause of the patient's chronic diarrhea.

## 2019-10-03 NOTE — Progress Notes (Signed)
Internal Medicine Clinic Attending  Case discussed with Dr. Chundi at the time of the visit.  We reviewed the resident's history and exam and pertinent patient test results.  I agree with the assessment, diagnosis, and plan of care documented in the resident's note. 

## 2019-10-04 ENCOUNTER — Ambulatory Visit: Payer: Medicare Other | Admitting: Podiatry

## 2019-10-06 ENCOUNTER — Other Ambulatory Visit: Payer: Self-pay | Admitting: *Deleted

## 2019-10-06 NOTE — Patient Outreach (Signed)
[facility]) Care Management  10/06/2019  Kathy Owens August 15, 1956 [id number]   RN Health Coach attempted follow up outreach call to patient.  Per patient this was not a good time to talk. Patient requested that [place] call her back tomorrow. Plan: RN will call patient again  [place] Management [phone] .

## 2019-10-07 ENCOUNTER — Other Ambulatory Visit: Payer: Self-pay | Admitting: *Deleted

## 2019-10-07 NOTE — Patient Outreach (Signed)
[facility]) Care Management  10/07/2019  Kathy Owens Sep 03, 1956 [id number]   RN Health Coach attempted follow up outreach call to patient next day as patient requested.   Patient was unavailable. HIPPA compliance voicemail message left with return callback number.  Plan: RN will call patient again within 30 days.  [facility] [phone]

## 2019-10-31 ENCOUNTER — Encounter: Payer: Self-pay | Admitting: Internal Medicine

## 2019-11-10 ENCOUNTER — Other Ambulatory Visit: Payer: Self-pay | Admitting: *Deleted

## 2019-11-10 NOTE — Patient Outreach (Signed)
[facility]) Care Management  11/10/2019  Kathy Owens 16-Dec-1955 [id number]   RN Health Coach telephone call to patient.  Hipaa compliance verified. Per patient she has a nurse that comes to see her every month. RN discussed other services that [facility] has to offer. Patient does not feel she needs any of them. Patient has agreed to receive brochure for [facility].  Plan RN will send closure letter to patient and [place] Management [phone]

## 2019-11-24 ENCOUNTER — Other Ambulatory Visit: Payer: Self-pay | Admitting: Internal Medicine

## 2019-11-24 ENCOUNTER — Telehealth: Payer: Self-pay | Admitting: Internal Medicine

## 2019-11-24 DIAGNOSIS — G894 Chronic pain syndrome: Secondary | ICD-10-CM

## 2019-11-24 NOTE — Telephone Encounter (Signed)
Pt calling back to follow up with a referral to see Kathy Owens.  Please advise .

## 2019-11-24 NOTE — Telephone Encounter (Signed)
I do not know why she wants mental health referral but I ordered.

## 2019-11-25 NOTE — Telephone Encounter (Signed)
Thanks!  Not really sure but will go ahead and forward referral to [employer].

## 2019-11-26 ENCOUNTER — Other Ambulatory Visit: Payer: Self-pay | Admitting: Internal Medicine

## 2019-11-26 DIAGNOSIS — I10 Essential (primary) hypertension: Secondary | ICD-10-CM

## 2019-11-28 ENCOUNTER — Telehealth: Payer: Self-pay

## 2019-11-28 DIAGNOSIS — I890 Lymphedema, not elsewhere classified: Secondary | ICD-10-CM

## 2019-11-28 NOTE — Telephone Encounter (Signed)
Pt states that her L leg has become pinkish,  Swollen, not hot, no pain. She states the compression stockings are old and now they are very tight- she may need to be re-measured for correct size. She states the nurse that comes out "from that company dr Software engineer talked to" took pictures and a NP called and wanted her to take ABX but pt wanted to see what dr Software engineer thought first Please advise

## 2019-11-28 NOTE — Telephone Encounter (Signed)
I called and spoke to Kathy Owens.  She states that her L leg has chronically been swollen due to lymphedema resulting from surgeries 30 years ago.  She states that she has always worn 2 pairs of stockings on this leg but that they are now too tight.  She states the nurse looked at her leg today and felt it was a tiny bit pink and recommended antibiotics.  Kathy. Owens thinks it has always been a little bit pink and cannot definitively say whether it has changed.  She has no fever, nausea, fatigue, or abdominal pain.  She mentioned one episode of diarrhea recently stemming from a meal of [place], mashed potatoes, gravy, and green beans but denies any frequent diarrhea.  I am asking her to not take antibiotics currently and I will try to get the pictures of her leg from remote health.  I also left a question with remote health how to get her stockings since she is hesitant to leave her home.

## 2019-11-28 NOTE — Telephone Encounter (Signed)
Requesting to speak with a nurse about cellulitis on the left leg. Please call pt back.

## 2019-11-29 NOTE — Telephone Encounter (Signed)
The home nurse for [facility] just called, she states pt need a diag of venous insufficiency needs to be added to pt problem list for medicaid to possibly pay for the hose. Triage called [facility] for a possible idea of cost, they do not file insurance but the knee high 20-30 pressure are 64.50 and thigh high 86.95 Attempted to call patricia the nurse back and a message came on at the [phone] and said the call could not be completed at this time so nurse called NP schmerge back and informed her that a person does not pick up that line, she stated she would have [place] home nurse call back Mardene Celeste called back and states she doesn't think medicare/ medicaid will pay for hose. With pt's problem of lymphedema could we possibly refer her to lymphedema clinic and they will get her the correct hose? Will return call to patricia after dr Software engineer assesses info given

## 2019-11-29 NOTE — Telephone Encounter (Signed)
Remote health, michelle schmerge NP calls and states she faxed the pics of pt's legs to [phone]. She states pt will be measured and new compression hose obtained for pt She states she would prefer [facility] calling her at [phone] not her personal ph of [phone]

## 2019-11-29 NOTE — Telephone Encounter (Signed)
Anything to get her the care she needs. Referral in. Will you call Kathy Owens?

## 2019-11-30 ENCOUNTER — Telehealth: Payer: Self-pay | Admitting: *Deleted

## 2019-11-30 NOTE — Telephone Encounter (Signed)
[facility] was notified of referral being placed for lyphedema clinic, she is agreeable. She ask if is ok to get COVID immunization and was told it is ok for her, and that there are risks with as all meds/ immunizations carry some risk, she is agreeable.

## 2019-11-30 NOTE — Addendum Note (Signed)
Addended by: Larey Dresser A on: 11/30/2019 02:22 PM   Modules accepted: Orders

## 2019-12-02 ENCOUNTER — Other Ambulatory Visit: Payer: Self-pay | Admitting: Internal Medicine

## 2019-12-02 MED ORDER — HYDROCODONE-ACETAMINOPHEN 5-325 MG PO TABS: 1.0000 | ORAL_TABLET | Freq: Four times a day (QID) | ORAL | 0 refills | Status: DC | PRN

## 2019-12-02 NOTE — Telephone Encounter (Signed)
Need refill on HYDROcodone-acetaminophen (NORCO/VICODIN) 5-325 MG tablet ;pt contact [phone]   [id number] - [place], [place] - [address] AT [address] 

## 2019-12-06 ENCOUNTER — Telehealth: Payer: Self-pay | Admitting: Licensed Clinical Social Worker

## 2019-12-06 NOTE — Telephone Encounter (Signed)
Patient was called to discuss services, patient agreed. Patient will be added to my schedule on 2/24 @ 12:30 via phone session.

## 2019-12-08 ENCOUNTER — Ambulatory Visit: Payer: Medicare Other | Admitting: Internal Medicine

## 2019-12-14 ENCOUNTER — Ambulatory Visit: Payer: Medicare Other | Admitting: Licensed Clinical Social Worker

## 2020-01-03 ENCOUNTER — Ambulatory Visit: Payer: Medicare Other | Admitting: Podiatry

## 2020-01-09 NOTE — Addendum Note (Signed)
Addended by: Hulan Fray on: 01/09/2020 12:15 PM   Modules accepted: Orders

## 2020-01-11 ENCOUNTER — Encounter: Payer: Self-pay | Admitting: Podiatry

## 2020-01-11 ENCOUNTER — Ambulatory Visit (INDEPENDENT_AMBULATORY_CARE_PROVIDER_SITE_OTHER): Payer: Medicare Other | Admitting: Podiatry

## 2020-01-11 ENCOUNTER — Other Ambulatory Visit: Payer: Self-pay

## 2020-01-11 DIAGNOSIS — M79675 Pain in left toe(s): Secondary | ICD-10-CM

## 2020-01-11 DIAGNOSIS — M79674 Pain in right toe(s): Secondary | ICD-10-CM | POA: Diagnosis not present

## 2020-01-11 DIAGNOSIS — B351 Tinea unguium: Secondary | ICD-10-CM | POA: Diagnosis not present

## 2020-01-11 DIAGNOSIS — G808 Other cerebral palsy: Secondary | ICD-10-CM

## 2020-01-11 NOTE — Progress Notes (Signed)
This patient returns to the office for evaluation and treatment of long thick painful nails .  This patient is unable to trim his own nails since the patient cannot reach the feet.  Patient says the nails are painful walking and wearing his shoes.  He returns for preventive foot care services.  General Appearance  Alert, conversant and in no acute stress.  Vascular  Dorsalis pedis and posterior tibial  pulses are palpable  bilaterally.  Capillary return is within normal limits  bilaterally. Temperature is within normal limits  bilaterally.  Neurologic  Senn-Weinstein monofilament wire test within normal limits  bilaterally. Muscle power within normal limits bilaterally.  Nails Thick disfigured discolored nails with subungual debris  from hallux to fifth toes bilaterally. No evidence of bacterial infection or drainage bilaterally.  Orthopedic  No limitations of motion  feet .  No crepitus or effusions noted.  No bony pathology or digital deformities noted. Severe HAV  B/L.  Hammer toes  B/L.   Flattened foot profile right foot with prominent navicular.  Skin  normotropic skin with no porokeratosis noted bilaterally.  No signs of infections or ulcers noted.     Onychomycosis  Pain in toes right foot  Pain in toes left foot  Debridement  of nails  1-5  B/L with a nail nipper.  Nails were then filed using a dremel tool with no incidents.    RTC   3 months   Gardiner Barefoot DPM

## 2020-01-19 ENCOUNTER — Other Ambulatory Visit: Payer: Self-pay

## 2020-01-19 ENCOUNTER — Ambulatory Visit (INDEPENDENT_AMBULATORY_CARE_PROVIDER_SITE_OTHER): Payer: Medicare Other | Admitting: Internal Medicine

## 2020-01-19 DIAGNOSIS — Z79899 Other long term (current) drug therapy: Secondary | ICD-10-CM

## 2020-01-19 DIAGNOSIS — K529 Noninfective gastroenteritis and colitis, unspecified: Secondary | ICD-10-CM | POA: Diagnosis not present

## 2020-01-19 DIAGNOSIS — Z8719 Personal history of other diseases of the digestive system: Secondary | ICD-10-CM

## 2020-01-19 DIAGNOSIS — G8929 Other chronic pain: Secondary | ICD-10-CM | POA: Diagnosis not present

## 2020-01-19 DIAGNOSIS — Z0000 Encounter for general adult medical examination without abnormal findings: Secondary | ICD-10-CM

## 2020-01-19 DIAGNOSIS — Z79891 Long term (current) use of opiate analgesic: Secondary | ICD-10-CM

## 2020-01-19 DIAGNOSIS — G894 Chronic pain syndrome: Secondary | ICD-10-CM

## 2020-01-19 DIAGNOSIS — K219 Gastro-esophageal reflux disease without esophagitis: Secondary | ICD-10-CM

## 2020-01-19 DIAGNOSIS — Z8619 Personal history of other infectious and parasitic diseases: Secondary | ICD-10-CM

## 2020-01-19 MED ORDER — LOPERAMIDE HCL 2 MG PO TABS: 2.0000 mg | ORAL_TABLET | Freq: Four times a day (QID) | ORAL | 0 refills | Status: DC | PRN

## 2020-01-19 MED ORDER — FAMOTIDINE 10 MG PO TABS: 10.0000 mg | ORAL_TABLET | Freq: Two times a day (BID) | ORAL | 3 refills | Status: DC

## 2020-01-19 NOTE — Assessment & Plan Note (Signed)
This problem is chronic and uncontrolled.  She states that the famotidine was too expensive.  I was able to look up [facility] and a 30-day supply appears to be $10.  She states this is a bit too expensive but she will try to get it.  I am also consulting CCM to see if there are any other medication assistance programs available to Kathy Owens.  PLAN:  Cont current meds

## 2020-01-19 NOTE — Progress Notes (Signed)
   Subjective:    Patient ID: Kathy Owens, female    DOB: 1956/01/03, 64 y.o.   MRN: [id number]  HPI  TELEHEALTH - AUDIO only.  Patient does not have a smart phone or tablet   This is a telephone encounter between Frenchie K Owens and Larey Dresser on 01/19/2020 for chronic diarrhea. The visit was conducted with the patient located at home and Larey Dresser at [facility]. The patient's identity was confirmed using their DOB and current address. The patient has consented to being evaluated through a telephone encounter and understands the associated risks (an examination cannot be done and the patient may need to come in for an appointment) / benefits (allows the patient to remain at home, decreasing exposure to coronavirus). I personally spent 18 minutes on medical discussion.   Kathy Owens is here for chronic diarrhea. Please see the A&P for the status of the pt's chronic medical problems.  ROS : per ROS section and in problem oriented charting. All other systems are negative.  PMHx, Soc hx, and / or Fam hx : Her sister Charleston Ropes is no longer living with her.  Charleston Ropes was hospitalized for a long time and was sober for 6 weeks.  After Charleston Ropes got her $1400 stimulus check, she apparently spent it all on vodka and is now drinking again.  [name] told Charleston Ropes that if she continues to drink, they are going to have to part ways.  She does not want to see her sister drink herself into her grave.  Review of Systems Positive for diarrhea, chronic pain of musculoskeletal system, gait abnormality Negative for weight loss, abdominal pain    Objective:   Physical Exam        Assessment & Plan:

## 2020-01-19 NOTE — Assessment & Plan Note (Signed)
This problem is chronic.  She continues to have diarrhea but minimizes the symptoms.  I know it was an issue because she requested a refill on the Imodium.  It sounds like almost any food causes diarrhea.  She specifically mentioned candy bars and rich foods like her meal of meat loaf, potatoes, and green beans.  She denies weight loss but does not have a scale.  She denies her clothes getting too big on her.  She understands I want her to see GI but is not quite yet ready to do that and also states that she has transportation issues.  She is willing to speak to CCM to set up transportation and will consider seeing GI subsequently.  Assessment : Chronic diarrhea, starting after a C. difficile infection. A subsequent C. difficile test was negative.  Therefore I do not think that this is recurrent chronic C. difficile.  It could be postinfectious but this seems to be a prolonged course.  Other possibilities would be microscopic colitis, SIBO, celiac disease...  Plan: Refill Imodium CCM consult for transportation Encourage GI consult

## 2020-01-19 NOTE — Assessment & Plan Note (Signed)
She has received both of her Covid vaccines.  She did not have any reactions to the first shot but the second shot made her ill for 1 day with a sore arm, chills, fever, vomiting, sweating, feeling hot, dizzy.Marland KitchenMarland Kitchen

## 2020-01-19 NOTE — Assessment & Plan Note (Signed)
This problem is chronic and stable.  She is on hydrocodone 5 mg and gets 120 pills every 30 days.  Her last UDS was in September 2017.  Repeat UDS has been delayed due to the pandemic.  I have very low suspicion for abuse or misuse of this medication.  I feel the benefits of the medication outweigh the risk.  She needs a refill in mid June and I will send myself a reminder to refill it.  She wants to come in and see me in person in May or June and I will get a UDS at that time.  Plan: Refill hydrocodone in mid May In person appointment in May.

## 2020-01-25 ENCOUNTER — Ambulatory Visit: Payer: Medicare Other

## 2020-01-25 DIAGNOSIS — I10 Essential (primary) hypertension: Secondary | ICD-10-CM

## 2020-01-25 DIAGNOSIS — G808 Other cerebral palsy: Secondary | ICD-10-CM

## 2020-01-25 NOTE — Chronic Care Management (AMB) (Signed)
  Care Management    01/25/2020 Name: Kathy Owens MRN: [id number] DOB: 02-26-1956  Referred by: Bartholomew Crews, MD Reason for referral : Care Coordination (Transportation, Financial Constraints)   Kathy Owens is a 64 y.o. year old female who is a primary care patient of Bartholomew Crews, MD. The care management team was consulted for assistance with chronic disease management and care coordination needs.   Review of patient status, including review of consultants reports, relevant laboratory and other test results, and collaboration with appropriate care team members and the patient's provider was performed as part of comprehensive patient evaluation and provision of care management services.    SDOH (Social Determinants of Health) assessments performed: No See Care Plan activities for detailed interventions related to SDOH)     Goals Addressed   None       [name] was given information about Care Management services today including:  1. Care Management services includes personalized support from designated clinical staff supervised by her physician, including individualized plan of care and coordination with other care providers 2. 24/7 contact phone numbers for assistance for urgent and routine care needs. 3. The patient may stop case management services at any time by phone call to the office staff.  Patient agreed to services and verbal consent obtained.   Patient did not have time to complete SDOH screening and discuss goals of treatment plan.  Requested a call back tomorrow.     Follow up plan: Telephone follow up appointment with care management team member scheduled for:01/26/20.  SIGNATURE

## 2020-01-26 ENCOUNTER — Ambulatory Visit: Payer: Medicare Other

## 2020-01-26 DIAGNOSIS — G808 Other cerebral palsy: Secondary | ICD-10-CM

## 2020-01-26 DIAGNOSIS — I10 Essential (primary) hypertension: Secondary | ICD-10-CM

## 2020-01-26 NOTE — Progress Notes (Signed)
Internal Medicine Clinic Attending  CCM services provided by the care management provider and their documentation were discussed with Dr. Koleen Distance. We reviewed the pertinent findings, urgent action items addressed by the resident and non-urgent items to be addressed by the PCP.  I agree with the assessment, diagnosis, and plan of care documented in the CCM and resident's note.  Gilles Chiquito, MD 01/26/2020

## 2020-01-26 NOTE — Progress Notes (Signed)
Internal Medicine Clinic Resident   I have personally reviewed this encounter including the documentation in this note and/or discussed this patient with the care management provider. I will address any urgent items identified by the care management provider and will communicate my actions to the patient's PCP. I have reviewed the patient's CCM visit with my supervising attending.  Cheryll Keisler D Katya Rolston, DO 01/26/2020    

## 2020-01-26 NOTE — Chronic Care Management (AMB) (Signed)
Chronic Care Management    Clinical Social Work General Note  01/26/2020 Name: Kathy Owens MRN: [id number] DOB: [birth year]  Kathy Owens is a 64 y.o. year old female who is a primary care patient of Bartholomew Crews, MD. The CCM was consulted to assist the patient with transportation needs and financial constraints.   Kathy Owens was given information about Chronic Care Management services today including:  1. CCM service includes personalized support from designated clinical staff supervised by her physician, including individualized plan of care and coordination with other care providers 2. 24/7 contact phone numbers for assistance for urgent and routine care needs. 3. Service will only be billed when office clinical staff spend 20 minutes or more in a month to coordinate care. 4. Only one practitioner may furnish and bill the service in a calendar month. 5. The patient may stop CCM services at any time (effective at the end of the month) by phone call to the office staff. 6. The patient will be responsible for cost sharing (co-pay) of up to 20% of the service fee (after annual deductible is met).  Patient agreed to services and verbal consent obtained.   Review of patient status, including review of consultants reports, relevant laboratory and other test results, and collaboration with appropriate care team members and the patient's provider was performed as part of comprehensive patient evaluation and provision of chronic care management services.    SDOH (Social Determinants of Health) assessments and interventions performed:  Yes SDOH Interventions     Most Recent Value  SDOH Interventions  SDOH Interventions for the Following Domains  Transportation  Transportation Interventions  -- [Informed patient that she likely has transportation benefit through [employer]]       Outpatient Encounter Medications as of 01/26/2020  Medication Sig  . alendronate (FOSAMAX) 70 MG tablet TAKE 1  TABLET(70 MG) BY MOUTH EVERY 7 DAYS WITH A FULL GLASS OF WATER AND ON AN EMPTY STOMACH  . aspirin 81 MG EC tablet Take 1 tablet (81 mg total) by mouth daily.  Marland Kitchen atorvastatin (LIPITOR) 10 MG tablet TAKE 1 TABLET(10 MG) BY MOUTH DAILY  . calamine lotion Apply topically 2 (two) times daily.  . cyanocobalamin (,VITAMIN B-12,) 1000 MCG/ML injection Inject 1 mL (1,000 mcg total) into the muscle once a week.  . famotidine (PEPCID) 10 MG tablet Take 1 tablet (10 mg total) by mouth 2 (two) times daily.  Marland Kitchen HYDROcodone-acetaminophen (NORCO/VICODIN) 5-325 MG tablet Take 1 tablet by mouth every 6 (six) hours as needed for moderate pain or severe pain.  . hydrocortisone cream 1 % Apply topically 2 (two) times daily.  Marland Kitchen loperamide (IMODIUM A-D) 2 MG tablet Take 1 tablet (2 mg total) by mouth 4 (four) times daily as needed for diarrhea or loose stools.  . meclizine (ANTIVERT) 12.5 MG tablet TAKE 1 TABLET BY MOUTH THREE TIMES DAILY AS NEEDED FOR DIZZINESS  . naproxen (EC NAPROSYN) 500 MG EC tablet Take 1 tablet (500 mg total) by mouth 2 (two) times daily with a meal.  . ondansetron (ZOFRAN) 4 MG tablet Take 1 tablet (4 mg total) by mouth every 8 (eight) hours as needed for nausea or vomiting.  . triamterene-hydrochlorothiazide (MAXZIDE-25) 37.5-25 MG tablet Take 1 tablet by mouth daily.   No facility-administered encounter medications on file as of 01/26/2020.    Goals Addressed   None     SDOH screening completed today and no Social Work needs identified at this time.  Patient aware of  transportation benefit through [facility] and educated about benefit through [facility].  Declined SCAT application    Follow Up Plan: Embedded care coordination team will continue to follow patient progress and assist with care coordination as needed.        SIGNATURE

## 2020-01-26 NOTE — Patient Instructions (Signed)
Visit Information  Goals Addressed   None     Kathy Owens was given information about Care Management services today including:  1. Care Management services include personalized support from designated clinical staff supervised by her physician, including individualized plan of care and coordination with other care providers 2. 24/7 contact phone numbers for assistance for urgent and routine care needs. 3. The patient may stop CCM services at any time (effective at the end of the month) by phone call to the office staff.  Patient agreed to services and verbal consent obtained.   Patient verbalizes understanding of instructions provided today.   The patient has been provided with contact information for the care management team and has been advised to call with any health related questions or concerns.      Ronn Melena, [place] Coordination Social Worker [place] [phone]

## 2020-01-30 ENCOUNTER — Ambulatory Visit: Payer: Self-pay | Admitting: *Deleted

## 2020-01-30 NOTE — Chronic Care Management (AMB) (Signed)
  Chronic Care Management   Outreach Note  01/30/2020 Name: Kathy Owens MRN: [id number] DOB: [birth year]  Referred by: Bartholomew Crews, MD Reason for referral : Chronic Care Management (HTN, HLD)   An unsuccessful telephone outreach was attempted today. The patient was referred to the case management team for assistance with care management and care coordination. Left HIPAA compliant voice message on patient's contact number requesting return call.   Follow Up Plan: The care management team will reach out to the patient again over the next 7 days.   Kelli Churn RN, CCM, [facility] RN Care Manager [phone]

## 2020-02-07 ENCOUNTER — Ambulatory Visit: Payer: Medicare Other | Admitting: *Deleted

## 2020-02-07 DIAGNOSIS — N183 Chronic kidney disease, stage 3 unspecified: Secondary | ICD-10-CM

## 2020-02-07 DIAGNOSIS — G808 Other cerebral palsy: Secondary | ICD-10-CM

## 2020-02-07 DIAGNOSIS — I10 Essential (primary) hypertension: Secondary | ICD-10-CM

## 2020-02-07 NOTE — Patient Instructions (Signed)
Visit Information It was very nice speaking with you today. I will call you in about a month and we can discuss the educational articles I mailed to you.   Goals Addressed            This Visit's Progress     Patient Stated   . " why don't you call me once a month for 2-3 months for now" (pt-stated)       CARE PLAN ENTRY (see longitudinal plan of care for additional care plan information)  Objective:  . Last practice recorded BP readings:  BP Readings from Last 3 Encounters:  05/26/19 137/78  11/22/18 (!) 164/72  11/15/18 (!) 154/88 .   Marland Kitchen Most recent eGFR/CrCl: No results found for: EGFR  No components found for: CRCL  Current Barriers:  Marland Kitchen Knowledge Deficits related to basic understanding of hypertension pathophysiology and self care management- clinic states her primary health concerns are related to chronic diarrhea and GERD, she says she does not monitor her blood pressure and wouldn't be able to even if she had a home monitor, she says a nurse comes to her house monthly and checks her blood pressure. She voiced appreciation for the information this CCM RN provided on the cost of famotidine at the [facility] pharmacy  Case Manager Clinical Goal(s):  Marland Kitchen Over the next 30 days, patient will verbalize understanding of plan for hypertension management . Over the next 30 days, patient will attend all scheduled medical appointments: 02/15/20 with [name] for behavioral health counseling and with podiatrist on 04/17/20  Interventions:  . Evaluation of current treatment plan related to hypertension self management and patient's adherence to plan as established by provider. . Provided education to patient re: stroke prevention, s/s of heart attack and stroke, DASH diet, complications of uncontrolled blood pressure using Emmi educational articles mailed to patient's home address . Mailed patient [place] Management spiral bound calendar with action plan and stop light  information for HTN . Mailed patient name, phone number and location of [facility] pharmacy and the cost of OTC famotidine 20 mg -25 tablets for $5.03. . Reviewed medications with patient and discussed importance of compliance . Discussed plans with patient for ongoing care management follow up and provided patient with direct contact information for care management team . Reviewed scheduled/upcoming provider appointments including: 02/15/20 with [name] for behavioral health counseling and with podiatrist on 04/17/20  Patient Self Care Activities:  . Self administers medications as prescribed . Attends all scheduled provider appointments . Calls provider office for new concerns, questions, or BP outside discussed parameters  Initial goal documentation         Ms. Martinique was given information about Chronic Care Management services today including:  1. CCM service includes personalized support from designated clinical staff supervised by her physician, including individualized plan of care and coordination with other care providers 2. 24/7 contact phone numbers for assistance for urgent and routine care needs. 3. Service will only be billed when office clinical staff spend 20 minutes or more in a month to coordinate care. 4. Only one practitioner may furnish and bill the service in a calendar month. 5. The patient may stop CCM services at any time (effective at the end of the month) by phone call to the office staff. 6. The patient will be responsible for cost sharing (co-pay) of up to 20% of the service fee (after annual deductible is met).  Patient agreed to services and  verbal consent obtained.   The patient verbalized understanding of instructions provided today and declined a print copy of patient instruction materials.   The care management team will reach out to the patient again over the next 30 days.   Kelli Churn RN, CCM, [facility] RN Care  Manager [phone]

## 2020-02-07 NOTE — Chronic Care Management (AMB) (Signed)
Chronic Care Management   Initial Visit Note  02/07/2020 Name: Kathy Owens MRN: [id number] DOB: 02-Dec-1955  Referred by: Bartholomew Crews, MD Reason for referral : Chronic Care Management (HTN, CP)   Kathy Owens is a 64 y.o. year old female who is a primary care patient of Bartholomew Crews, MD. The CCM team was consulted for assistance with chronic disease management and care coordination needs related to HTN, HLD and CKD Stage 3  Review of patient status, including review of consultants reports, relevant laboratory and other test results, and collaboration with appropriate care team members and the patient's provider was performed as part of comprehensive patient evaluation and provision of chronic care management services.    SDOH (Social Determinants of Health) assessments performed: No See Care Plan activities for detailed interventions related to SDOH     Medications: Outpatient Encounter Medications as of 02/07/2020  Medication Sig Note  . alendronate (FOSAMAX) 70 MG tablet TAKE 1 TABLET(70 MG) BY MOUTH EVERY 7 DAYS WITH A FULL GLASS OF WATER AND ON AN EMPTY STOMACH   . aspirin 81 MG EC tablet Take 1 tablet (81 mg total) by mouth daily.   Marland Kitchen atorvastatin (LIPITOR) 10 MG tablet TAKE 1 TABLET(10 MG) BY MOUTH DAILY   . HYDROcodone-acetaminophen (NORCO/VICODIN) 5-325 MG tablet Take 1 tablet by mouth every 6 (six) hours as needed for moderate pain or severe pain.   Marland Kitchen loperamide (IMODIUM A-D) 2 MG tablet Take 1 tablet (2 mg total) by mouth 4 (four) times daily as needed for diarrhea or loose stools.   . meclizine (ANTIVERT) 12.5 MG tablet TAKE 1 TABLET BY MOUTH THREE TIMES DAILY AS NEEDED FOR DIZZINESS   . triamterene-hydrochlorothiazide (MAXZIDE-25) 37.5-25 MG tablet Take 1 tablet by mouth daily.   . calamine lotion Apply topically 2 (two) times daily. (Patient not taking: Reported on 02/07/2020)   . cyanocobalamin (,VITAMIN B-12,) 1000 MCG/ML injection Inject 1 mL (1,000  mcg total) into the muscle once a week. (Patient not taking: Reported on 02/07/2020)   . famotidine (PEPCID) 10 MG tablet Take 1 tablet (10 mg total) by mouth 2 (two) times daily. (Patient not taking: Reported on 02/07/2020) 02/07/2020: Says she is out of the medication but will resume taking it when she buys some more  . hydrocortisone cream 1 % Apply topically 2 (two) times daily. (Patient not taking: Reported on 02/07/2020)   . [DISCONTINUED] naproxen (EC NAPROSYN) 500 MG EC tablet Take 1 tablet (500 mg total) by mouth 2 (two) times daily with a meal. (Patient not taking: Reported on 02/07/2020)   . [DISCONTINUED] ondansetron (ZOFRAN) 4 MG tablet Take 1 tablet (4 mg total) by mouth every 8 (eight) hours as needed for nausea or vomiting. (Patient not taking: Reported on 02/07/2020)    No facility-administered encounter medications on file as of 02/07/2020.     Objective:  BP Readings from Last 3 Encounters:  05/26/19 137/78  11/22/18 (!) 164/72  11/15/18 (!) 154/88   Lab Results  Component Value Date   NA 139 09/09/2019   K 3.5 09/09/2019   CO2 18 09/09/2019   GLUCOSE 110 (H) 05/26/2019   BUN 21 09/09/2019   CREATININE 1.1 09/09/2019   CALCIUM 9.6 09/09/2019   GFRNONAA 51 (L) 05/26/2019   GFRAA 59 (L) 05/26/2019   Goals Addressed            This Visit's Progress     Patient Stated   . " why don't you call me  once a month for 2-3 months for now" (pt-stated)       CARE PLAN ENTRY (see longitudinal plan of care for additional care plan information)  Objective:  . Last practice recorded BP readings:  BP Readings from Last 3 Encounters:  05/26/19 137/78  11/22/18 (!) 164/72  11/15/18 (!) 154/88 .   Marland Kitchen Most recent eGFR/CrCl: No results found for: EGFR  No components found for: CRCL  Current Barriers:  Marland Kitchen Knowledge Deficits related to basic understanding of hypertension pathophysiology and self care management- clinic states her primary health concerns are related to chronic  diarrhea and GERD, she says she does not monitor her blood pressure and wouldn't be able to even if she had a home monitor, she says a nurse comes to her house monthly and checks her blood pressure. She voiced appreciation for the information this CCM RN provided on the cost of famotidine at the [place]):  Marland Kitchen Over the next 30 days, patient will verbalize understanding of plan for hypertension management . Over the next 30 days, patient will attend all scheduled medical appointments: 02/15/20 with [name] for behavioral health counseling and with podiatrist on 04/17/20  Interventions:  . Evaluation of current treatment plan related to hypertension self management and patient's adherence to plan as established by provider. . Provided education to patient re: stroke prevention, s/s of heart attack and stroke, DASH diet, complications of uncontrolled blood pressure using Emmi educational articles mailed to patient's home address . Mailed patient [facility] spiral bound calendar with action plan and stop light information for HTN . Mailed patient name, phone number and location of [place] [facility] pharmacy and the cost of OTC famotidine 20 mg -25 tablets for $5.03. . Reviewed medications with patient and discussed importance of compliance . Discussed plans with patient for ongoing care management follow up and provided patient with direct contact information for care management team . Reviewed scheduled/upcoming provider appointments including: 02/15/20 with [name] for behavioral health counseling and with podiatrist on 04/17/20  Patient Self Care Activities:  . Self administers medications as prescribed . Attends all scheduled provider appointments . Calls provider office for new concerns, questions, or BP outside discussed parameters  Initial goal documentation          Kathy Owens was given information about Chronic  Care Management services today including:  1. CCM service includes personalized support from designated clinical staff supervised by her physician, including individualized plan of care and coordination with other care providers 2. 24/7 contact phone numbers for assistance for urgent and routine care needs. 3. Service will only be billed when office clinical staff spend 20 minutes or more in a month to coordinate care. 4. Only one practitioner may furnish and bill the service in a calendar month. 5. The patient may stop CCM services at any time (effective at the end of the month) by phone call to the office staff. 6. The patient will be responsible for cost sharing (co-pay) of up to 20% of the service fee (after annual deductible is met).  Patient agreed to services and verbal consent obtained.   Plan:   The care management team will reach out to the patient again over the next 30 days.   Kelli Churn RN, CCM, [facility] RN Care Manager [phone]

## 2020-02-07 NOTE — Progress Notes (Signed)
Internal Medicine Clinic PCP & Faculty  I have personally reviewed this encounter including the documentation in this note and/or discussed this patient with the care management provider. I will address any urgent items identified by the care management provider.  Larey Dresser, MD 02/07/2020

## 2020-02-15 ENCOUNTER — Encounter: Payer: Self-pay | Admitting: Licensed Clinical Social Worker

## 2020-02-15 ENCOUNTER — Other Ambulatory Visit: Payer: Self-pay

## 2020-02-15 ENCOUNTER — Ambulatory Visit (INDEPENDENT_AMBULATORY_CARE_PROVIDER_SITE_OTHER): Payer: Medicare Other | Admitting: Licensed Clinical Social Worker

## 2020-02-15 DIAGNOSIS — F419 Anxiety disorder, unspecified: Secondary | ICD-10-CM

## 2020-02-15 NOTE — BH Specialist Note (Signed)
Integrated Behavioral Health Visit via Telemedicine (Telephone)  02/15/2020 Kathy Owens [id number]   Session Start time: 10:30  Session End time: 11:00 Total time: 30 minutes  Referring Provider: Dr. Lynnae January Type of Visit: Telephonic Patient location: Home [facility] Provider location: Office All persons participating in visit: Patient and Kathy Owens  Confirmed patient's address: Yes  Confirmed patient's phone number: Yes  Any changes to demographics: No   Discussed confidentiality: Yes    The following statements were read to the patient and/or legal guardian that are established with the [facility] Provider.  "The purpose of this phone visit is to provide behavioral health care while limiting exposure to the coronavirus (COVID19).  There is a possibility of technology failure and discussed alternative modes of communication if that failure occurs."  "By engaging in this telephone visit, you consent to the provision of healthcare.  Additionally, you authorize for your insurance to be billed for the services provided during this telephone visit."   Patient and/or legal guardian consented to telephone visit: Yes   PRESENTING CONCERNS: Patient and/or family reports the following symptoms/concerns: interpersonal conflict, anxiety, codependency.  Duration of problem: over one year; Severity of problem: mild  GOALS ADDRESSED: Patient will: 1.  Reduce symptoms of: anxiety and stress  2.  Increase knowledge and/or ability of: coping skills, healthy habits and stress reduction  3.  Demonstrate ability to: Increase healthy adjustment to current life circumstances and Increase adequate support systems for patient/family  INTERVENTIONS: Interventions utilized:  Solution-Focused Strategies, Behavioral Activation and Supportive Counseling Standardized Assessments completed: assessed for SI, HI, and self-harm.  ASSESSMENT: Patient currently experiencing mild anxiety due to challenges with  her family dynamics. Patient lives alone, and has a younger adult sister. Patient worries almost daily about her sister's well-being, and questions what role she should play in her sister's care. Patient reported feeling guilt for not supporting her sister financially. Patient can work on improving healthy limits and boundary setting with her sister.    Patient may benefit from counseling.  PLAN: 1. Follow up with behavioral health clinician on : two to three weeks.   Kathy Owens, [facility], [place]

## 2020-02-20 ENCOUNTER — Other Ambulatory Visit: Payer: Self-pay | Admitting: Internal Medicine

## 2020-02-20 MED ORDER — HYDROCODONE-ACETAMINOPHEN 5-325 MG PO TABS: 1.0000 | ORAL_TABLET | Freq: Four times a day (QID) | ORAL | 0 refills | Status: DC | PRN

## 2020-02-20 NOTE — Progress Notes (Signed)
Also pt asked about changing in-person appt to telehealth - Dr Lynnae January stated no problem and she does not need to be seen until July. July schedule is not out yet; asked pt to call back later this month; stated she will.

## 2020-02-20 NOTE — Progress Notes (Signed)
Called pt - no answer; left message to call the office . 

## 2020-02-20 NOTE — Progress Notes (Signed)
Call from pt - informed hydrocodone rx was refilled and due mid month.

## 2020-02-29 ENCOUNTER — Ambulatory Visit (INDEPENDENT_AMBULATORY_CARE_PROVIDER_SITE_OTHER): Payer: Medicare Other | Admitting: Licensed Clinical Social Worker

## 2020-02-29 ENCOUNTER — Other Ambulatory Visit: Payer: Self-pay

## 2020-02-29 DIAGNOSIS — F419 Anxiety disorder, unspecified: Secondary | ICD-10-CM

## 2020-03-01 ENCOUNTER — Ambulatory Visit: Payer: Medicare Other | Admitting: *Deleted

## 2020-03-01 DIAGNOSIS — G808 Other cerebral palsy: Secondary | ICD-10-CM

## 2020-03-01 DIAGNOSIS — I10 Essential (primary) hypertension: Secondary | ICD-10-CM

## 2020-03-01 DIAGNOSIS — N183 Chronic kidney disease, stage 3 unspecified: Secondary | ICD-10-CM

## 2020-03-01 NOTE — Patient Instructions (Signed)
Visit Information It was nice speaking with you today. I'm glad to hear you are doing OK.   Goals Addressed            This Visit's Progress     Patient Stated   . " Why don't you call me once a month for 2-3 months for now" (pt-stated)       CARE PLAN ENTRY (see longitudinal plan of care for additional care plan information)  Objective:  BP Readings from Last 3 Encounters:  05/26/19 137/78  11/22/18 (!) 164/72  11/15/18 (!) 154/88    . Most recent eGFR/CrCl: No results found for: EGFR  No components found for: CRCL  Current Barriers:  Marland Kitchen Knowledge Deficits related to basic understanding of hypertension pathophysiology and self care management- patient states she is doing OK, reports good medication taking behavior, she says she does not monitor her blood pressure and wouldn't be able to even if she had a home monitor, she says a nurse comes to her house monthly and checks her blood pressure. She voiced appreciation for the HTN and CKD educational information this CCM RN mailed to her, says she read the information and has no follow up questions, she says she recently completed a telehealth visit with [name] for behavioral health counseling and has a follow up appointment on 03/27/20  Case Manager Clinical Goal(s):  Marland Kitchen Over the next 30 days, patient will verbalize understanding of plan for hypertension management . Over the next 30 days, patient will attend all scheduled medical appointments: 03/27/20 with [name] for behavioral health counseling and with podiatrist on 04/17/20  Interventions:  . Evaluation of current treatment plan related to hypertension self management and patient's adherence to plan as established by provider. . Provided patient with opportunity to ask questions about Emmi education mailed to her re: stroke prevention, s/s of heart attack and stroke, DASH diet, complications of uncontrolled blood pressure and CKD . Reminded her she can purchase OTC  famotidine 20 mg -25 tablets for $5.03 at the [place] [facility] . Reviewed medications with patient and discussed importance of compliance . Discussed plans with patient for ongoing care management follow up and provided patient with direct contact information for care management team . Reviewed scheduled/upcoming provider appointments including: 02/15/20 with [name] for behavioral health counseling and with podiatrist on 04/17/20  Patient Self Care Activities:  . Self administers medications as prescribed . Attends all scheduled provider appointments . Calls provider office for new concerns, questions, or BP outside discussed parameters  Please see past updates related to this goal by clicking on the "Past Updates" button in the selected goal          The patient verbalized understanding of instructions provided today and declined a print copy of patient instruction materials.   The care management team will reach out to the patient again over the next 30 days.   Kelli Churn RN, CCM, [facility] RN Care Manager [phone]

## 2020-03-01 NOTE — Chronic Care Management (AMB) (Signed)
Chronic Care Management   Follow Up Note   03/01/2020 Name: Kathy Owens MRN: [id number] DOB: 1956-07-26  Referred by: Bartholomew Crews, MD Reason for referral : No chief complaint on file.   Kathy Owens is a 64 y.o. year old female who is a primary care patient of Bartholomew Crews, MD. The CCM team was consulted for assistance with chronic disease management and care coordination needs.    Review of patient status, including review of consultants reports, relevant laboratory and other test results, and collaboration with appropriate care team members and the patient's provider was performed as part of comprehensive patient evaluation and provision of chronic care management services.    SDOH (Social Determinants of Health) assessments performed: No See Care Plan activities for detailed interventions related to [facility])     Outpatient Encounter Medications as of 03/01/2020  Medication Sig Note  . alendronate (FOSAMAX) 70 MG tablet TAKE 1 TABLET(70 MG) BY MOUTH EVERY 7 DAYS WITH A FULL GLASS OF WATER AND ON AN EMPTY STOMACH   . aspirin 81 MG EC tablet Take 1 tablet (81 mg total) by mouth daily.   Marland Kitchen atorvastatin (LIPITOR) 10 MG tablet TAKE 1 TABLET(10 MG) BY MOUTH DAILY   . calamine lotion Apply topically 2 (two) times daily. (Patient not taking: Reported on 02/07/2020)   . cyanocobalamin (,VITAMIN B-12,) 1000 MCG/ML injection Inject 1 mL (1,000 mcg total) into the muscle once a week. (Patient not taking: Reported on 02/07/2020)   . famotidine (PEPCID) 10 MG tablet Take 1 tablet (10 mg total) by mouth 2 (two) times daily. (Patient not taking: Reported on 02/07/2020) 02/07/2020: Says she is out of the medication but will resume taking it when she buys some more  . HYDROcodone-acetaminophen (NORCO/VICODIN) 5-325 MG tablet Take 1 tablet by mouth every 6 (six) hours as needed for moderate pain or severe pain.   . hydrocortisone cream 1 % Apply topically 2 (two) times daily. (Patient not  taking: Reported on 02/07/2020)   . loperamide (IMODIUM A-D) 2 MG tablet Take 1 tablet (2 mg total) by mouth 4 (four) times daily as needed for diarrhea or loose stools.   . meclizine (ANTIVERT) 12.5 MG tablet TAKE 1 TABLET BY MOUTH THREE TIMES DAILY AS NEEDED FOR DIZZINESS   . triamterene-hydrochlorothiazide (MAXZIDE-25) 37.5-25 MG tablet Take 1 tablet by mouth daily.    No facility-administered encounter medications on file as of 03/01/2020.     Objective:  Wt Readings from Last 3 Encounters:  05/26/19 143 lb 12.8 oz (65.2 kg)  11/15/18 166 lb 3.6 oz (75.4 kg)  11/04/18 151 lb 9.6 oz (68.8 kg)   Lab Results  Component Value Date   CREATININE 1.1 09/09/2019   BUN 21 09/09/2019   NA 139 09/09/2019   K 3.5 09/09/2019   CL 98 (A) 09/09/2019   CO2 18 09/09/2019    Goals Addressed            This Visit's Progress     Patient Stated   . " Why don't you call me once a month for 2-3 months for now" (pt-stated)       CARE PLAN ENTRY (see longitudinal plan of care for additional care plan information)  Objective:  BP Readings from Last 3 Encounters:  05/26/19 137/78  11/22/18 (!) 164/72  11/15/18 (!) 154/88    . Most recent eGFR/CrCl: No results found for: EGFR  No components found for: CRCL  Current Barriers:  Marland Kitchen Knowledge Deficits related  to basic understanding of hypertension pathophysiology and self care management- patient states she is doing OK, reports good medication taking behavior, she says she does not monitor her blood pressure and wouldn't be able to even if she had a home monitor, she says a nurse comes to her house monthly and checks her blood pressure. She voiced appreciation for the HTN and CKD educational information this CCM RN mailed to her, says she read the information and has no follow up questions, she says she recently completed a telehealth visit with [name] for behavioral health counseling and has a follow up appointment on 03/27/20  Case Manager  Clinical Goal(s):  Marland Kitchen Over the next 30 days, patient will verbalize understanding of plan for hypertension management . Over the next 30 days, patient will attend all scheduled medical appointments: 03/27/20 with [name] for behavioral health counseling and with podiatrist on 04/17/20  Interventions:  . Evaluation of current treatment plan related to hypertension self management and patient's adherence to plan as established by provider. . Provided patient with opportunity to ask questions about Emmi education mailed to her re: stroke prevention, s/s of heart attack and stroke, DASH diet, complications of uncontrolled blood pressure and CKD . Reminded her she can purchase OTC famotidine 20 mg -25 tablets for $5.03 at the [place] on [facility] . Reviewed medications with patient and discussed importance of compliance . Discussed plans with patient for ongoing care management follow up and provided patient with direct contact information for care management team . Reviewed scheduled/upcoming provider appointments including: 02/15/20 with [name] for behavioral health counseling and with podiatrist on 04/17/20  Patient Self Care Activities:  . Self administers medications as prescribed . Attends all scheduled provider appointments . Calls provider office for new concerns, questions, or BP outside discussed parameters  Please see past updates related to this goal by clicking on the "Past Updates" button in the selected goal           Plan:   The care management team will reach out to the patient again over the next 30 days.    Kelli Churn RN, CCM, [facility] RN Care Manager [phone]

## 2020-03-05 NOTE — Progress Notes (Signed)
Internal Medicine Clinic Resident  I have personally reviewed this encounter including the documentation in this note and/or discussed this patient with the care management provider. I will address any urgent items identified by the care management provider and will communicate my actions to the patient's PCP. I have reviewed the patient's CCM visit with my supervising attending, Dr Raines.  Qiana Landgrebe K Annelies Coyt, MD 03/05/2020   

## 2020-03-06 NOTE — BH Specialist Note (Signed)
Integrated Behavioral Health Visit via Telemedicine (Telephone)  03/06/2020 Carrol K [place] [id number]   Session Start time: 12:35  Session End time: 1:00 Total time: 25 minutes  Referring Provider: Dr. Lynnae January Type of Visit: Telephonic Patient location: Home [facility] Provider location: Office All persons participating in visit: Office and [facility]  Confirmed patient's address: Yes  Confirmed patient's phone number: Yes  Any changes to demographics: No   Discussed confidentiality: Yes    The following statements were read to the patient and/or legal guardian that are established with the [facility] Provider.  "The purpose of this phone visit is to provide behavioral health care while limiting exposure to the coronavirus (COVID19).  There is a possibility of technology failure and discussed alternative modes of communication if that failure occurs."  "By engaging in this telephone visit, you consent to the provision of healthcare.  Additionally, you authorize for your insurance to be billed for the services provided during this telephone visit."   Patient and/or legal guardian consented to telephone visit: Yes   PRESENTING CONCERNS: Patient and/or family reports the following symptoms/concerns:  interpersonal conflict, anxiety, codependency.  Duration of problem: over one year; Severity of problem: mild GOALS ADDRESSED: Patient will: 1.  Reduce symptoms of: anxiety and stress  2.  Increase knowledge and/or ability of: coping skills, healthy habits and stress reduction  3.  Demonstrate ability to: Increase healthy adjustment to current life circumstances  INTERVENTIONS: Interventions utilized:  Motivational Interviewing, Brief CBT and Supportive Counseling Standardized Assessments completed: Not Needed  ASSESSMENT: Patient currently experiencing mild levels of anxiety around her relationship with her sister.  Patient has tried to put some boundaries in place with her sister, in  order to reduce the level of co-dependency between the two of them. Patient reported feeling less responsibility and guilt for towards her sister, but this continues to be an area for improvement.  Patient is reporting worry everyday, but the severity level appears to have reduced since the last session.   Patient may benefit from counseling.  PLAN: 1. Follow up with behavioral health clinician on : one month.  [name], [facility], [place]

## 2020-03-08 ENCOUNTER — Encounter: Payer: Self-pay | Admitting: Licensed Clinical Social Worker

## 2020-03-27 ENCOUNTER — Ambulatory Visit: Payer: Medicare Other | Admitting: Licensed Clinical Social Worker

## 2020-03-27 ENCOUNTER — Telehealth: Payer: Self-pay | Admitting: Licensed Clinical Social Worker

## 2020-03-27 ENCOUNTER — Telehealth: Payer: Self-pay | Admitting: *Deleted

## 2020-03-27 NOTE — Telephone Encounter (Signed)
Patient was called for her scheduled visit. Patient needed to reschedule due to not having privacy.

## 2020-03-27 NOTE — Telephone Encounter (Signed)
Called pt to check in on her after it was reported that pt's sister was at her home reportedly intoxicated being very loud w/ pt. Lm for rtc

## 2020-03-30 ENCOUNTER — Other Ambulatory Visit: Payer: Self-pay

## 2020-03-30 ENCOUNTER — Encounter (HOSPITAL_COMMUNITY): Payer: Self-pay | Admitting: Emergency Medicine

## 2020-03-30 ENCOUNTER — Ambulatory Visit: Payer: Self-pay | Admitting: *Deleted

## 2020-03-30 ENCOUNTER — Telehealth: Payer: Medicare Other

## 2020-03-30 ENCOUNTER — Ambulatory Visit (HOSPITAL_COMMUNITY)
Admission: EM | Admit: 2020-03-30 | Discharge: 2020-03-30 | Disposition: A | Discharge status: Home / Self Care | Payer: Medicare Other | Attending: Physician Assistant | Admitting: Physician Assistant

## 2020-03-30 DIAGNOSIS — E7800 Pure hypercholesterolemia, unspecified: Secondary | ICD-10-CM

## 2020-03-30 DIAGNOSIS — H6121 Impacted cerumen, right ear: Secondary | ICD-10-CM | POA: Diagnosis not present

## 2020-03-30 DIAGNOSIS — I10 Essential (primary) hypertension: Secondary | ICD-10-CM

## 2020-03-30 DIAGNOSIS — G808 Other cerebral palsy: Secondary | ICD-10-CM

## 2020-03-30 NOTE — ED Triage Notes (Signed)
Pt c/o bilateral ear fullness onset last night. She states this happens about once a year.

## 2020-03-30 NOTE — Chronic Care Management (AMB) (Signed)
  Chronic Care Management   Outreach Note  03/30/2020 Name: Kathy Owens MRN: [id number] DOB: 03/02/1956  Referred by: Bartholomew Crews, MD Reason for referral : Chronic Care Management (HTN, CKD, Cerebral Palsey)   An unsuccessful telephone outreach was attempted today. The patient was referred to the case management team for assistance with care management and care coordination. Sister answered phone and stated patient is currently at [facility] ED for wax removal in her ears.  Follow Up Plan: The care management team will reach out to the patient again over the next 7 days.   Kelli Churn RN, CCM, [facility] RN Care Manager [phone]

## 2020-03-30 NOTE — Discharge Instructions (Signed)
Follow up with PCP
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
## 2020-04-05 ENCOUNTER — Ambulatory Visit: Payer: Medicare Other | Admitting: *Deleted

## 2020-04-05 DIAGNOSIS — G808 Other cerebral palsy: Secondary | ICD-10-CM

## 2020-04-05 DIAGNOSIS — N2889 Other specified disorders of kidney and ureter: Secondary | ICD-10-CM

## 2020-04-05 DIAGNOSIS — E7800 Pure hypercholesterolemia, unspecified: Secondary | ICD-10-CM

## 2020-04-05 DIAGNOSIS — I10 Essential (primary) hypertension: Secondary | ICD-10-CM

## 2020-04-05 DIAGNOSIS — N183 Chronic kidney disease, stage 3 unspecified: Secondary | ICD-10-CM

## 2020-04-05 NOTE — Patient Instructions (Signed)
Visit Information I enjoyed getting to know you by phone. If you should wish to re-engage in the chronic care management program, please call the internal medicine clinic.  Goals Addressed              This Visit's Progress     Patient Stated   .  COMPLETED: " Why don't you call me once a month for 2-3 months for now" (pt-stated)        CARE PLAN ENTRY (see longitudinal plan of care for additional care plan information)  Objective:  BP Readings from Last 3 Encounters:  05/26/19 137/78  11/22/18 (!) 164/72  11/15/18 (!) 154/88    . Most recent eGFR/CrCl: No results found for: EGFR  No components found for: CRCL  Current Barriers:  Marland Kitchen Knowledge Deficits related to basic understanding of hypertension pathophysiology and self care management- patient states she is doing OK, she said she went to the [facility] on 6/11 because she lost hearing in her ear due to impacted wax, she says it happens every year and a provider told her it was related to her allergies, reports good medication taking behavior, she says she does not monitor her blood pressure and wouldn't be able to even if she had a home monitor, she says a nurse from [facility] comes to her house monthly and checks her blood pressure. She voiced appreciation for the HTN and CKD educational information this CCM RN mailed to her, says she read the information and has no follow up questions, she says she recently completed a telehealth visit with [name] for behavioral health counseling and has a follow up appointment on 04/10/20, she says she has appreciated the monthly calls from this CCM RN but since she is in the [place] she doesn't feel she needs ongoing chronic care management services  Case Manager Clinical Goal(s):  Marland Kitchen Over the next 30 days, patient will verbalize understanding of plan for hypertension management   Interventions:  . Evaluation of current treatment plan related  to hypertension self management and patient's adherence to plan as established by provider. . Reviewed medications with patient and discussed importance of compliance . Per patient request will close to [facility] program. Reminded patient that if she would like to reengage in the program to contact the clinic.  Patient Self Care Activities:  . Self administers medications as prescribed . Attends all scheduled provider appointments . Calls provider office for new concerns, questions, or BP outside discussed parameters  Please see past updates related to this goal by clicking on the "Past Updates" button in the selected goal          The patient verbalized understanding of instructions provided today and declined a print copy of patient instruction materials.   No further follow up required: as 04/05/20 CCM enrollment status changed to "previously enrolled" as per patient request on 04/05/20 to discontinue enrollment. Case closed to case management services in primary care home.   Kelli Churn RN, CCM, [facility] RN Care Manager [phone]

## 2020-04-05 NOTE — Chronic Care Management (AMB) (Signed)
Chronic Care Management   Follow Up Note   04/05/2020 Name: Kathy Owens MRN: [id number] DOB: 08/15/1956  Referred by: Butcher, Elizabeth A, MD Reason for referral : Chronic Care Management (HTN, HLD, Cerebral Palsey)   Kathy Owens is a 64 y.o. year old female who is a primary care patient of Butcher, Elizabeth A, MD. The CCM team was consulted for assistance with chronic disease management and care coordination needs.    Review of patient status, including review of consultants reports, relevant laboratory and other test results, and collaboration with appropriate care team members and the patient's provider was performed as part of comprehensive patient evaluation and provision of chronic care management services.    SDOH (Social Determinants of Health) assessments performed: No See Care Plan activities for detailed interventions related to SDOH)     Outpatient Encounter Medications as of 04/05/2020  Medication Sig Note  . alendronate (FOSAMAX) 70 MG tablet TAKE 1 TABLET(70 MG) BY MOUTH EVERY 7 DAYS WITH A FULL GLASS OF WATER AND ON AN EMPTY STOMACH   . aspirin 81 MG EC tablet Take 1 tablet (81 mg total) by mouth daily.   . atorvastatin (LIPITOR) 10 MG tablet TAKE 1 TABLET(10 MG) BY MOUTH DAILY   . calamine lotion Apply topically 2 (two) times daily. (Patient not taking: Reported on 02/07/2020)   . cyanocobalamin (,VITAMIN B-12,) 1000 MCG/ML injection Inject 1 mL (1,000 mcg total) into the muscle once a week. (Patient not taking: Reported on 02/07/2020)   . famotidine (PEPCID) 10 MG tablet Take 1 tablet (10 mg total) by mouth 2 (two) times daily. (Patient not taking: Reported on 02/07/2020) 02/07/2020: Says she is out of the medication but will resume taking it when she buys some more  . HYDROcodone-acetaminophen (NORCO/VICODIN) 5-325 MG tablet Take 1 tablet by mouth every 6 (six) hours as needed for moderate pain or severe pain.   . hydrocortisone cream 1 % Apply topically 2 (two)  times daily. (Patient not taking: Reported on 02/07/2020)   . loperamide (IMODIUM A-D) 2 MG tablet Take 1 tablet (2 mg total) by mouth 4 (four) times daily as needed for diarrhea or loose stools.   . meclizine (ANTIVERT) 12.5 MG tablet TAKE 1 TABLET BY MOUTH THREE TIMES DAILY AS NEEDED FOR DIZZINESS   . triamterene-hydrochlorothiazide (MAXZIDE-25) 37.5-25 MG tablet Take 1 tablet by mouth daily.    No facility-administered encounter medications on file as of 04/05/2020.     Objective:   Goals Addressed              This Visit's Progress     Patient Stated   .  COMPLETED: " Why don't you call me once a month for 2-3 months for now" (pt-stated)        CARE PLAN ENTRY (see longitudinal plan of care for additional care plan information)  Objective:  BP Readings from Last 3 Encounters:  05/26/19 137/78  11/22/18 (!) 164/72  11/15/18 (!) 154/88    . Most recent eGFR/CrCl: No results found for: EGFR  No components found for: CRCL  Current Barriers:  . Knowledge Deficits related to basic understanding of hypertension pathophysiology and self care management- patient states she is doing OK, she said she went to the [facility] on 6/11 because she lost hearing in her ear due to impacted wax, she says it happens every year and a provider told her it was related to her allergies, reports good medication taking behavior, she says she does   not monitor her blood pressure and wouldn't be able to even if she had a home monitor, she says a nurse from [facility] comes to her house monthly and checks her blood pressure. She voiced appreciation for the HTN and CKD educational information this CCM RN mailed to her, says she read the information and has no follow up questions, she says she recently completed a telehealth visit with [name] for behavioral health counseling and has a follow up appointment on 04/10/20, she says she has appreciated the monthly calls from this CCM RN but since she is in  the [place] she doesn't feel she needs ongoing chronic care management services  Case Manager Clinical Goal(s):  Marland Kitchen Over the next 30 days, patient will verbalize understanding of plan for hypertension management   Interventions:  . Evaluation of current treatment plan related to hypertension self management and patient's adherence to plan as established by provider. . Reviewed medications with patient and discussed importance of compliance . Per patient request will close to [facility] program. Reminded patient that if she would like to reengage in the program to contact the Internal Medicine Clinic.  Patient Self Care Activities:  . Self administers medications as prescribed . Attends all scheduled provider appointments . Calls provider office for new concerns, questions, or BP outside discussed parameters  Please see past updates related to this goal by clicking on the "Past Updates" button in the selected goal           Plan:   No further follow up required: 04/05/20 CCM enrollment status changed to "previously enrolled" as per patient request on 04/05/20 to discontinue enrollment. Case closed to case management services in primary care home.    Kelli Churn RN, CCM, [facility] RN Care Manager [phone]

## 2020-04-06 NOTE — Progress Notes (Signed)
Internal Medicine Clinic Attending  CCM services provided by the care management provider and their documentation were discussed with Dr. Masoudi. We reviewed the pertinent findings, urgent action items addressed by the resident and non-urgent items to be addressed by the PCP.  I agree with the assessment, diagnosis, and plan of care documented in the CCM and resident's note.  Hutchinson Isenberg Thomas Ravindra Baranek, MD 04/06/2020  

## 2020-04-06 NOTE — Progress Notes (Signed)
Internal Medicine Clinic Resident  I have personally reviewed this encounter including the documentation in this note and/or discussed this patient with the care management provider. I will address any urgent items identified by the care management provider and will communicate my actions to the patient's PCP. I have reviewed the patient's CCM visit with my supervising attending, Dr Evette Doffing. Apparently, per patient request, no further CCM follow up.  Dewayne Hatch, MD 04/06/2020

## 2020-04-09 ENCOUNTER — Encounter: Payer: Self-pay | Admitting: *Deleted

## 2020-04-10 ENCOUNTER — Ambulatory Visit: Payer: Medicare Other | Admitting: Licensed Clinical Social Worker

## 2020-04-16 ENCOUNTER — Encounter: Payer: Self-pay | Admitting: *Deleted

## 2020-04-17 ENCOUNTER — Ambulatory Visit: Payer: Medicare Other | Admitting: Podiatry

## 2020-04-25 ENCOUNTER — Ambulatory Visit: Payer: Medicare Other | Admitting: Licensed Clinical Social Worker

## 2020-06-04 ENCOUNTER — Other Ambulatory Visit: Payer: Self-pay

## 2020-06-04 NOTE — Telephone Encounter (Signed)
HYDROcodone-acetaminophen (NORCO/VICODIN) 5-325 MG tablet, refill request @  [id number] - [place], [place] - [address] AT [address] Phone:  [phone]  Fax:  [phone]      

## 2020-06-06 ENCOUNTER — Ambulatory Visit: Payer: Medicare Other | Admitting: Podiatry

## 2020-06-06 ENCOUNTER — Other Ambulatory Visit: Payer: Self-pay | Admitting: *Deleted

## 2020-06-06 MED ORDER — HYDROCODONE-ACETAMINOPHEN 5-325 MG PO TABS: 1.0000 | ORAL_TABLET | Freq: Four times a day (QID) | ORAL | 0 refills | Status: DC | PRN

## 2020-06-14 ENCOUNTER — Encounter: Payer: Medicare Other | Admitting: Internal Medicine

## 2020-07-03 ENCOUNTER — Other Ambulatory Visit: Payer: Self-pay | Admitting: Internal Medicine

## 2020-07-03 NOTE — Telephone Encounter (Signed)
Need refill on HYDROcodone-acetaminophen (NORCO/VICODIN) 5-325 MG tablet ;pt contact [phone]   [id number] - [place], [place] - [address] AT [address] 

## 2020-07-04 MED ORDER — HYDROCODONE-ACETAMINOPHEN 5-325 MG PO TABS: 1.0000 | ORAL_TABLET | Freq: Four times a day (QID) | ORAL | 0 refills | Status: DC | PRN

## 2020-07-12 ENCOUNTER — Ambulatory Visit (INDEPENDENT_AMBULATORY_CARE_PROVIDER_SITE_OTHER): Payer: Medicare Other | Admitting: Internal Medicine

## 2020-07-12 ENCOUNTER — Other Ambulatory Visit: Payer: Self-pay

## 2020-07-12 ENCOUNTER — Encounter: Payer: Self-pay | Admitting: Internal Medicine

## 2020-07-12 DIAGNOSIS — G8929 Other chronic pain: Secondary | ICD-10-CM

## 2020-07-12 DIAGNOSIS — Z0000 Encounter for general adult medical examination without abnormal findings: Secondary | ICD-10-CM

## 2020-07-12 DIAGNOSIS — K219 Gastro-esophageal reflux disease without esophagitis: Secondary | ICD-10-CM

## 2020-07-12 DIAGNOSIS — M810 Age-related osteoporosis without current pathological fracture: Secondary | ICD-10-CM

## 2020-07-12 DIAGNOSIS — E538 Deficiency of other specified B group vitamins: Secondary | ICD-10-CM

## 2020-07-12 DIAGNOSIS — E785 Hyperlipidemia, unspecified: Secondary | ICD-10-CM | POA: Diagnosis not present

## 2020-07-12 DIAGNOSIS — I10 Essential (primary) hypertension: Secondary | ICD-10-CM

## 2020-07-12 DIAGNOSIS — R42 Dizziness and giddiness: Secondary | ICD-10-CM

## 2020-07-12 DIAGNOSIS — K828 Other specified diseases of gallbladder: Secondary | ICD-10-CM

## 2020-07-12 DIAGNOSIS — E739 Lactose intolerance, unspecified: Secondary | ICD-10-CM

## 2020-07-12 DIAGNOSIS — K529 Noninfective gastroenteritis and colitis, unspecified: Secondary | ICD-10-CM | POA: Diagnosis not present

## 2020-07-12 DIAGNOSIS — Z79891 Long term (current) use of opiate analgesic: Secondary | ICD-10-CM | POA: Insufficient documentation

## 2020-07-12 DIAGNOSIS — N183 Chronic kidney disease, stage 3 unspecified: Secondary | ICD-10-CM

## 2020-07-12 DIAGNOSIS — F1120 Opioid dependence, uncomplicated: Secondary | ICD-10-CM | POA: Insufficient documentation

## 2020-07-12 DIAGNOSIS — Z8669 Personal history of other diseases of the nervous system and sense organs: Secondary | ICD-10-CM | POA: Insufficient documentation

## 2020-07-12 MED ORDER — ATORVASTATIN CALCIUM 10 MG PO TABS: ORAL_TABLET | ORAL | 3 refills | Status: DC

## 2020-07-12 MED ORDER — LOPERAMIDE HCL 2 MG PO TABS
2.0000 mg | ORAL_TABLET | Freq: Every day | ORAL | 2 refills | Status: DC | PRN
Start: 2020-07-12 — End: 2020-10-10

## 2020-07-12 MED ORDER — TRIAMTERENE-HCTZ 37.5-25 MG PO TABS: 1.0000 | ORAL_TABLET | Freq: Every day | ORAL | 3 refills | Status: DC

## 2020-07-12 MED ORDER — MECLIZINE HCL 12.5 MG PO TABS: 12.5000 mg | ORAL_TABLET | Freq: Every day | ORAL | 3 refills | Status: AC | PRN

## 2020-07-12 NOTE — Assessment & Plan Note (Signed)
Due for BP measure and BMP to monitor electrolytes and renal fxn on Maxzide.  She agrees to an in person visit.

## 2020-07-12 NOTE — Assessment & Plan Note (Signed)
Due for lipid panel at next visit.

## 2020-07-12 NOTE — Assessment & Plan Note (Signed)
See problem "chronic pain" for documentation.

## 2020-07-12 NOTE — Assessment & Plan Note (Signed)
Currently taking 1000 mcg B12 orally daily.  Will check level at next visit to determine if dose or frequency can be reduced.  Her most recent B12 level was elevated after initiation of therapy.

## 2020-07-12 NOTE — Assessment & Plan Note (Signed)
Has declined Pap in the past due to difficulty with positioning related to her CP.  Stool cards have been favored over colonoscopy.  I will discuss mammography at next visit.  She has completed Covid vaccine spring 2021, and plans to receive her flu shot at an upcoming visit.

## 2020-07-12 NOTE — Assessment & Plan Note (Signed)
"  I ache all over from my arthritis".  She states that at one point she was taking hydrocodone 10 mg 4 times daily, which was reduced at some point to 5 mg 4 times daily.  She has never considered lowering the dose or frequency, and feels that she would have unacceptable exacerbation of pain.  We will continue have conversations.  Duloxetine may have a role in chronic pain management.  I will explore whether or not there is a opioid contract for chronic use.  She does not have a history of misuse of her prescriptions.

## 2020-07-12 NOTE — Assessment & Plan Note (Signed)
Was not able to afford famotidine, and has taken advise to not lay down after her evening meal (eating earlier, doesn't lie down until at least 4 hrs have passed), which has resolved her symptoms (which were primarily at night).  We will resolve this problem.

## 2020-07-12 NOTE — Progress Notes (Addendum)
Telehealth visit w/o video to make acquaintance and review chronic conditions and any new updates. She confirms her identity with birthdate and agrees to telehealth visit as she is familiar with this mode and its benefits and limitations.  Kathy Owens received the call from her phone, and I placed the call from the [facility] office.  35 minutes were spent in conversation.  Kathy Owens reports that she has been doing well, just bored with the Covid situation, not being able to go out out as she would like into the community, frustrated with the ongoing pandemic.  She has received her Covid vaccinations.  No new problems to report. "Nothing bothering me. I seem to be holding my own. Aches with arthritis. Naproxen on hand, but uses judiciously because of concerns about CKD3.  It works well for arthritis pain, for which she has been taking opioid 4x/day for years.  Influenza vaccine- hadn't thought about it; will come in for a visit for this and for labs in next few weeks.  Prevention - has declined mammog, colonoscopy, (favors stool cards), pap in the past (positioning difficult)  When inquired about how her GERD symptoms are doing, she reports that they have resolved.  She received some good advice that she should not lay down for about 4 hours after her evening meal, and this has resolved the problem, which was primarily at night or when she was supine.  She is taking no medication.    Has loose stools since C diff; won't go out due to embarrassment.  This is actually an infrequent problem, once or twice per week, and actually may follow ingestion of milk-containing products (she reports that she was diagnosed with lactose intolerance by her PCP sometime ago).   Porcelain GB (incidental finding); had been advised to see a Psychologist, sport and exercise.  She does not wish to see a surgeon as she is experiencing no symptoms.  She has had no weight loss, no right upper quadrant pain and no nausea.   "I don't want to go, I wish I didn't  even know about it".  Osteoporosis -has been started on Fosamax in 11/2015 for osteoporosis on DEXA.  Plan was for 5-year treatment with holiday beginning 2022.  Upon inquiry into this medication, she reports that she has not been taking it for at least a year (doesn't like the inconvenience, about a year ago).  She is worried about risk of atypical fracture of thigh, would rather check vit D/Ca and discuss Boniva monthly later.  She is not currently taking any vitamin D supplement, and has not had a vitamin D level checked in years.  Statin and ASA for primary prevention  Hearing/wax management - ears flushed at Urgent Care 03/2020  Indicates that the vertigo that she experienced more frequently many years ago has greatly decreased in frequency.  She does benefit from meclizine and requests a supply be available on the rare occasion she may experience an episode.   Assessment and Plan:  Will make appt in next few weeks for in person visit, BP measure and weight, and for labs (BMP, B12, D) and influenza vaccine.  At that time will review options for osteoporosis management, review CRC screening options, discuss mammogram. See problem based documentation.   Med review:   D/ced meds no longer needed (prns) D/ced fosamax (she quit around 1 yr ago) D/ced B12 injections (no longer taking, she is on oral)  Addendum: Remote health nursing visit to her home on 07/13/20.  No new problems  identified.  BP 144/82. Will have document scanned.

## 2020-07-12 NOTE — Assessment & Plan Note (Signed)
Infrequent, episodic, responds well to meclizine.  Refill written.  I will explore symptoms in more detail at an in person visit, as long-term use of meclizine is not ideal in her age group.

## 2020-07-12 NOTE — Assessment & Plan Note (Signed)
Check, she agrees to come to clinic for labs.  Takes NSAID judiciously as she has been informed of the risk of worsening her CKD.

## 2020-07-12 NOTE — Assessment & Plan Note (Signed)
No weight loss, right upper quadrant pain, or nausea.  "I wish I did not even know about this problem, I do not want to have surgery and I do not want to have it evaluated by a surgeon".

## 2020-07-12 NOTE — Assessment & Plan Note (Signed)
Kathy Owens stopped her bisphosphonate at least a year ago.  "I did not like it, it was annoying to have to take it first thing in the morning and then not eat for 30 minutes so just stopped it".  We discussed indications.  She remains concerned about the low risk of atypical femur fractures and jaw osteonecrosis.  We will continue discussion about importance of treatment at next visit, at which time we will also obtain a vitamin D level, as she has not been taking a vitamin D supplement.

## 2020-07-26 ENCOUNTER — Encounter: Payer: Self-pay | Admitting: Internal Medicine

## 2020-07-26 ENCOUNTER — Ambulatory Visit (INDEPENDENT_AMBULATORY_CARE_PROVIDER_SITE_OTHER): Payer: Medicare Other | Admitting: Internal Medicine

## 2020-07-26 ENCOUNTER — Ambulatory Visit: Payer: Self-pay

## 2020-07-26 VITALS — BP 132/72 | HR 72 | Temp 98.2°F | Wt 143.5 lb

## 2020-07-26 DIAGNOSIS — E785 Hyperlipidemia, unspecified: Secondary | ICD-10-CM

## 2020-07-26 DIAGNOSIS — E538 Deficiency of other specified B group vitamins: Secondary | ICD-10-CM

## 2020-07-26 DIAGNOSIS — N183 Chronic kidney disease, stage 3 unspecified: Secondary | ICD-10-CM | POA: Diagnosis not present

## 2020-07-26 DIAGNOSIS — I10 Essential (primary) hypertension: Secondary | ICD-10-CM

## 2020-07-26 DIAGNOSIS — E7800 Pure hypercholesterolemia, unspecified: Secondary | ICD-10-CM | POA: Diagnosis not present

## 2020-07-26 DIAGNOSIS — I129 Hypertensive chronic kidney disease with stage 1 through stage 4 chronic kidney disease, or unspecified chronic kidney disease: Secondary | ICD-10-CM | POA: Diagnosis not present

## 2020-07-26 DIAGNOSIS — I872 Venous insufficiency (chronic) (peripheral): Secondary | ICD-10-CM

## 2020-07-26 DIAGNOSIS — M810 Age-related osteoporosis without current pathological fracture: Secondary | ICD-10-CM

## 2020-07-26 DIAGNOSIS — G8929 Other chronic pain: Secondary | ICD-10-CM

## 2020-07-26 NOTE — Chronic Care Management (AMB) (Signed)
RNCM Kelli Churn and BSW Perlie Stene removed from patient's Care Team as she is no longer receiving CCM services.      Ronn Melena, [place] Coordination Social Worker [place] [phone]

## 2020-07-26 NOTE — Assessment & Plan Note (Signed)
Vitamin D level being checked today.
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
## 2020-07-26 NOTE — Assessment & Plan Note (Signed)
BMP today to assess status of this chronic problem.

## 2020-07-26 NOTE — Assessment & Plan Note (Signed)
Lipid panel today to monitor status of this chronic problem.

## 2020-07-26 NOTE — Assessment & Plan Note (Signed)
Stable problem with improved control.  BMP today to monitor electrolytes and renal function on her current dose of Maxide which we will continue for now.

## 2020-07-26 NOTE — Assessment & Plan Note (Signed)
No complaints of pain at this time.  Monitor.

## 2020-07-26 NOTE — Patient Instructions (Signed)
It was wonderful to meet you today, Ms. Martinique!  We're glad you were able to come in after this long year of Covid stress.  Today we are checking blood tests to include kidney function, cholesterol levels, liver function, and your levels of vitamins B12 and D.  You received your flu shot.  Your blood pressure looks great and I don't anticipate making any changes in your medications, but I"ll certainly follow up with results of your blood tests and we will go from there.  Please come back and see me in 6 months!  Stay well, Dr. Jimmye Norman

## 2020-07-26 NOTE — Assessment & Plan Note (Signed)
B12 level today to assess status of this chronic problem.

## 2020-07-26 NOTE — Assessment & Plan Note (Signed)
[name] requests a couple new pairs of TED hose.  I am not sure that she needs these at this time, though she feels that they are beneficial.  She is wearing the stocking on her left leg, where she has some venous insufficiency, though no edema at this time.

## 2020-07-27 LAB — CMP14 + ANION GAP
ALT: 15 IU/L (ref 0–32)
AST: 19 IU/L (ref 0–40)
Albumin/Globulin Ratio: 2 (ref 1.2–2.2)
Albumin: 4.7 g/dL (ref 3.8–4.8)
Alkaline Phosphatase: 69 IU/L (ref 44–121)
Anion Gap: 18 mmol/L (ref 10.0–18.0)
BUN/Creatinine Ratio: 25 (ref 12–28)
BUN: 24 mg/dL (ref 8–27)
Bilirubin Total: 0.5 mg/dL (ref 0.0–1.2)
CO2: 19 mmol/L — ABNORMAL LOW (ref 20–29)
Calcium: 9.4 mg/dL (ref 8.7–10.3)
Chloride: 104 mmol/L (ref 96–106)
Creatinine, Ser: 0.95 mg/dL (ref 0.57–1.00)
GFR calc Af Amer: 73 mL/min/{1.73_m2} (ref >59)
GFR calc non Af Amer: 63 mL/min/{1.73_m2} (ref >59)
Globulin, Total: 2.3 g/dL (ref 1.5–4.5)
Glucose: 99 mg/dL (ref 65–99)
Potassium: 3.8 mmol/L (ref 3.5–5.2)
Sodium: 141 mmol/L (ref 134–144)
Total Protein: 7 g/dL (ref 6.0–8.5)

## 2020-07-27 LAB — LIPID PANEL
Chol/HDL Ratio: 3.1 ratio (ref 0.0–4.4)
Cholesterol, Total: 174 mg/dL (ref 100–199)
HDL: 57 mg/dL (ref >39)
LDL Chol Calc (NIH): 94 mg/dL (ref 0–99)
Triglycerides: 130 mg/dL (ref 0–149)
VLDL Cholesterol Cal: 23 mg/dL (ref 5–40)

## 2020-07-27 LAB — VITAMIN B12: Vitamin B-12: >2000 pg/mL — ABNORMAL HIGH (ref 232–1245)

## 2020-07-27 LAB — VITAMIN D 25 HYDROXY (VIT D DEFICIENCY, FRACTURES): Vit D, 25-Hydroxy: 13.7 ng/mL — ABNORMAL LOW (ref 30.0–100.0)

## 2020-07-30 ENCOUNTER — Other Ambulatory Visit: Payer: Self-pay

## 2020-07-30 ENCOUNTER — Telehealth: Payer: Self-pay

## 2020-07-30 MED ORDER — HYDROCODONE-ACETAMINOPHEN 5-325 MG PO TABS
1.0000 | ORAL_TABLET | Freq: Four times a day (QID) | ORAL | 0 refills | Status: DC | PRN
Start: 2020-07-30 — End: 2020-08-29

## 2020-07-30 NOTE — Telephone Encounter (Signed)
Requesting lab results, please call pt back.  

## 2020-07-30 NOTE — Telephone Encounter (Signed)
HYDROcodone-acetaminophen (NORCO/VICODIN) 5-325 MG tablet, refill request @  [id number] - [place], [place] - [address] Phone:  [phone]  Fax:  [phone]      

## 2020-07-31 NOTE — Telephone Encounter (Signed)
Call returned, results shared.

## 2020-07-31 NOTE — Telephone Encounter (Signed)
Gave Kathy Owens her lab results by phone.  SHe was pleased to hear that she has improved from CKD3 to CKD2.  We discussed low vitamin D level and she will begin OTC VitD3 2000IU daily, repeat level in 40M.  Dr. Jimmye Norman

## 2020-07-31 NOTE — Telephone Encounter (Signed)
Pt calling back about her tests results.

## 2020-08-22 ENCOUNTER — Ambulatory Visit (INDEPENDENT_AMBULATORY_CARE_PROVIDER_SITE_OTHER): Payer: Medicare Other | Admitting: Podiatry

## 2020-08-22 ENCOUNTER — Other Ambulatory Visit: Payer: Self-pay

## 2020-08-22 ENCOUNTER — Encounter: Payer: Self-pay | Admitting: Podiatry

## 2020-08-22 DIAGNOSIS — I872 Venous insufficiency (chronic) (peripheral): Secondary | ICD-10-CM | POA: Diagnosis not present

## 2020-08-22 DIAGNOSIS — M79674 Pain in right toe(s): Secondary | ICD-10-CM

## 2020-08-22 DIAGNOSIS — G808 Other cerebral palsy: Secondary | ICD-10-CM | POA: Diagnosis not present

## 2020-08-22 DIAGNOSIS — N183 Chronic kidney disease, stage 3 unspecified: Secondary | ICD-10-CM | POA: Diagnosis not present

## 2020-08-22 DIAGNOSIS — B351 Tinea unguium: Secondary | ICD-10-CM | POA: Diagnosis not present

## 2020-08-22 DIAGNOSIS — M79675 Pain in left toe(s): Secondary | ICD-10-CM

## 2020-08-22 NOTE — Progress Notes (Signed)
This patient returns to the office for evaluation and treatment of long thick painful nails .  This patient is unable to trim her own nails since the patient cannot reach her feet.  Patient says the nails are painful walking and wearing his shoes. She returns for preventive foot care services. Patient has venous insufficiency and kidney disease.  General Appearance  Alert, conversant and in no acute stress.  Vascular  Dorsalis pedis and posterior tibial  pulses are palpable  bilaterally.  Capillary return is within normal limits  bilaterally. Temperature is within normal limits  bilaterally.  Neurologic  Senn-Weinstein monofilament wire test within normal limits  bilaterally. Muscle power within normal limits bilaterally.  Nails Thick disfigured discolored nails with subungual debris  from hallux to fifth toes bilaterally. No evidence of bacterial infection or drainage bilaterally.  Orthopedic  No limitations of motion  feet .  No crepitus or effusions noted.  No bony pathology or digital deformities noted. Severe HAV  B/L.  Hammer toes  B/L.   Flattened foot profile right foot with prominent navicular.  Skin  normotropic skin with no porokeratosis noted bilaterally.  No signs of infections or ulcers noted.     Onychomycosis  Pain in toes right foot  Pain in toes left foot  Debridement  of nails  1-5  B/L with a nail nipper.  Nails were then filed using a dremel tool with no incidents.    RTC   3 months   Gardiner Barefoot DPM

## 2020-08-28 ENCOUNTER — Telehealth: Payer: Self-pay | Admitting: *Deleted

## 2020-08-28 NOTE — Telephone Encounter (Signed)
Good advice, I agree.

## 2020-08-28 NOTE — Telephone Encounter (Signed)
Call from pt - concern about loose stools/ C.Diff. I asked if she has been taking Imodium; she stated "yes" but not everyday. Informed according to rx , she can take 1 daily and once/if her stools become form, she does not need to take it that day. She stated if she takes 1 today, tomorrow her stool ill be formed until the following day. Stated she's concern she may have cancer. Also informed she has 1 more refill on Imodium. She wanted me to let her doctor know about her diarrhea. Stated she's getting tired of having "C DIff" ; she's unable to go out with her friends sometimes. Also she asked about her kidneys- informed "she has improved from CKD3 to CKD2." per Dr Jimmye Norman - stated she was glad this.

## 2020-08-29 ENCOUNTER — Other Ambulatory Visit: Payer: Self-pay

## 2020-08-29 NOTE — Telephone Encounter (Signed)
HYDROcodone-acetaminophen (NORCO/VICODIN) 5-325 MG tablet, refill request @  [facility] [place], [place] Phone:  [phone]  Fax:  [phone]

## 2020-08-31 MED ORDER — HYDROCODONE-ACETAMINOPHEN 5-325 MG PO TABS: 1.0000 | ORAL_TABLET | Freq: Four times a day (QID) | ORAL | 0 refills | Status: DC | PRN

## 2020-08-31 NOTE — Telephone Encounter (Signed)
Pt is calling back regarding pain medicine, pls contact pt [phone]

## 2020-09-17 ENCOUNTER — Telehealth: Payer: Self-pay

## 2020-09-17 NOTE — Telephone Encounter (Signed)
Pls contact pt regarding calcium medicine [phone]

## 2020-09-17 NOTE — Telephone Encounter (Signed)
Pt wants to go back to the once a week calcium

## 2020-09-18 NOTE — Telephone Encounter (Signed)
She didn't remember the name of it and I couldn't find anything on her old medlist.

## 2020-09-18 NOTE — Telephone Encounter (Signed)
Does she mean the bisphosphonate? A weekly calcium isn't ringing a bell for me.

## 2020-09-18 NOTE — Telephone Encounter (Signed)
Requesting to speak with a nurse about meds, please call pt back.  

## 2020-09-19 ENCOUNTER — Other Ambulatory Visit: Payer: Self-pay | Admitting: Internal Medicine

## 2020-09-19 DIAGNOSIS — M810 Age-related osteoporosis without current pathological fracture: Secondary | ICD-10-CM

## 2020-09-19 MED ORDER — ALENDRONATE SODIUM 35 MG PO TABS: 35.0000 mg | ORAL_TABLET | ORAL | 3 refills | Status: DC

## 2020-09-19 NOTE — Telephone Encounter (Signed)
SHe must mean alendronate, I'll reorder.  Osteoporosis medication.

## 2020-09-24 ENCOUNTER — Other Ambulatory Visit: Payer: Self-pay

## 2020-09-24 MED ORDER — HYDROCODONE-ACETAMINOPHEN 5-325 MG PO TABS
1.0000 | ORAL_TABLET | Freq: Four times a day (QID) | ORAL | 0 refills | Status: DC | PRN
Start: 2020-09-30 — End: 2020-10-31

## 2020-09-24 NOTE — Telephone Encounter (Signed)
Last rx written 08/31/20. Last OV 07/26/20. Next OV has not been scheduled. UDS 06/26/16.

## 2020-09-24 NOTE — Telephone Encounter (Signed)
HYDROcodone-acetaminophen (NORCO/VICODIN) 5-325 MG tablet, refill request @  [facility], [place] Phone:  [phone]  Fax:  [phone]

## 2020-10-31 ENCOUNTER — Other Ambulatory Visit: Payer: Self-pay

## 2020-10-31 MED ORDER — HYDROCODONE-ACETAMINOPHEN 5-325 MG PO TABS: 1.0000 | ORAL_TABLET | Freq: Four times a day (QID) | ORAL | 0 refills | Status: DC | PRN

## 2020-10-31 NOTE — Telephone Encounter (Signed)
  HYDROcodone-acetaminophen (NORCO/VICODIN) 5-325 MG tablet, REFILL REQUEST @  [id number] - [place], [place] - [place] Phone:  [phone]  Fax:  [phone]

## 2020-10-31 NOTE — Telephone Encounter (Signed)
Last rx written  09/30/20. Last OV  07/26/20. Next OV  Has not been scheduled. UDS  06/26/16.

## 2020-11-14 ENCOUNTER — Telehealth: Payer: Self-pay

## 2020-11-14 NOTE — Telephone Encounter (Signed)
Error

## 2020-11-27 ENCOUNTER — Ambulatory Visit: Payer: Medicare Other | Admitting: Podiatry

## 2020-11-28 ENCOUNTER — Other Ambulatory Visit: Payer: Self-pay

## 2020-11-28 MED ORDER — HYDROCODONE-ACETAMINOPHEN 5-325 MG PO TABS: 1.0000 | ORAL_TABLET | Freq: Four times a day (QID) | ORAL | 0 refills | Status: DC | PRN

## 2020-11-28 NOTE — Telephone Encounter (Signed)
Pt is requesting her HYDROcodone-acetaminophen (NORCO/VICODIN) 5-325 MG tablet sent to  [facility], [place] AT [place] Phone:  [phone]  Fax:  [phone]

## 2020-12-21 ENCOUNTER — Other Ambulatory Visit: Payer: Self-pay | Admitting: Internal Medicine

## 2020-12-21 ENCOUNTER — Telehealth: Payer: Self-pay

## 2020-12-21 MED ORDER — LOPERAMIDE HCL 2 MG PO CAPS: 2.0000 mg | ORAL_CAPSULE | Freq: Two times a day (BID) | ORAL | 3 refills | Status: DC | PRN

## 2020-12-21 NOTE — Telephone Encounter (Signed)
Pt is returning a phone call, please call back.

## 2020-12-21 NOTE — Telephone Encounter (Signed)
12 tabs with 3 refills sent 09/19/2020. Attempted to reach [facility] to confirm they have this Rx and can get ready for patient. Unfortunately, this RN was on hold for 7 min and again for 13 minutes without anyone answering. Call placed to patient. No answer. Left message on VM requesting return call.

## 2020-12-21 NOTE — Telephone Encounter (Signed)
Need refill on alendronate (FOSAMAX) 35 MG tablet(Expired) ;pt contact [facility], [place]

## 2020-12-21 NOTE — Telephone Encounter (Signed)
Will do!

## 2020-12-21 NOTE — Telephone Encounter (Signed)
Patient called back, states she has the alendronate, she needs refill on loperamide. States she takes this med almost daily, otherwise, she will have 1-2 watery stools per day. Requesting dose be increased to 2 tabs per day. This is no longer on active med list.

## 2020-12-25 ENCOUNTER — Other Ambulatory Visit: Payer: Self-pay

## 2020-12-25 MED ORDER — HYDROCODONE-ACETAMINOPHEN 5-325 MG PO TABS: 1.0000 | ORAL_TABLET | Freq: Four times a day (QID) | ORAL | 0 refills | Status: DC | PRN

## 2020-12-25 NOTE — Telephone Encounter (Signed)
Last rx written 11/28/20. Last OV  07/26/20. Next OV 01/24/21. UDS 06/26/16.

## 2020-12-25 NOTE — Telephone Encounter (Signed)
Need refill on HYDROcodone-acetaminophen (NORCO/VICODIN) 5-325 MG tablet ;pt contact [facility], [place]

## 2020-12-28 ENCOUNTER — Other Ambulatory Visit: Payer: Self-pay

## 2020-12-28 MED ORDER — HYDROCODONE-ACETAMINOPHEN 5-325 MG PO TABS: 1.0000 | ORAL_TABLET | Freq: Four times a day (QID) | ORAL | 0 refills | Status: DC | PRN

## 2020-12-28 NOTE — Telephone Encounter (Signed)
  HYDROcodone-acetaminophen (NORCO/VICODIN) 5-325 MG tablet, is not at the pharmacy, please call pt back.

## 2020-12-28 NOTE — Telephone Encounter (Signed)
RX was resent by Dr. Dareen Piano w/ receipt confirmation from pharmacy. TC to patient, non-identifying VM obtained, no message left SChaplin, RN,BSN

## 2021-01-05 ENCOUNTER — Emergency Department (HOSPITAL_COMMUNITY): Payer: Medicare Other

## 2021-01-05 ENCOUNTER — Encounter (HOSPITAL_COMMUNITY): Payer: Self-pay | Admitting: Emergency Medicine

## 2021-01-05 ENCOUNTER — Emergency Department (HOSPITAL_COMMUNITY)
Admission: EM | Admit: 2021-01-05 | Discharge: 2021-01-05 | Disposition: A | Discharge status: Home / Self Care | Payer: Medicare Other | Attending: Emergency Medicine | Admitting: Emergency Medicine

## 2021-01-05 DIAGNOSIS — M791 Myalgia, unspecified site: Secondary | ICD-10-CM | POA: Insufficient documentation

## 2021-01-05 DIAGNOSIS — M21931 Unspecified acquired deformity of right forearm: Secondary | ICD-10-CM | POA: Diagnosis not present

## 2021-01-05 DIAGNOSIS — N183 Chronic kidney disease, stage 3 unspecified: Secondary | ICD-10-CM | POA: Diagnosis not present

## 2021-01-05 DIAGNOSIS — M79661 Pain in right lower leg: Secondary | ICD-10-CM | POA: Insufficient documentation

## 2021-01-05 DIAGNOSIS — Z9660 Presence of unspecified orthopedic joint implant: Secondary | ICD-10-CM | POA: Diagnosis not present

## 2021-01-05 DIAGNOSIS — M25531 Pain in right wrist: Secondary | ICD-10-CM | POA: Diagnosis not present

## 2021-01-05 DIAGNOSIS — R001 Bradycardia, unspecified: Secondary | ICD-10-CM | POA: Diagnosis not present

## 2021-01-05 DIAGNOSIS — I129 Hypertensive chronic kidney disease with stage 1 through stage 4 chronic kidney disease, or unspecified chronic kidney disease: Secondary | ICD-10-CM | POA: Diagnosis not present

## 2021-01-05 DIAGNOSIS — M79631 Pain in right forearm: Secondary | ICD-10-CM | POA: Diagnosis not present

## 2021-01-05 DIAGNOSIS — M79621 Pain in right upper arm: Secondary | ICD-10-CM | POA: Insufficient documentation

## 2021-01-05 DIAGNOSIS — R279 Unspecified lack of coordination: Secondary | ICD-10-CM | POA: Diagnosis not present

## 2021-01-05 DIAGNOSIS — R600 Localized edema: Secondary | ICD-10-CM | POA: Diagnosis not present

## 2021-01-05 DIAGNOSIS — Z7982 Long term (current) use of aspirin: Secondary | ICD-10-CM | POA: Insufficient documentation

## 2021-01-05 DIAGNOSIS — Z743 Need for continuous supervision: Secondary | ICD-10-CM | POA: Diagnosis not present

## 2021-01-05 DIAGNOSIS — Z79899 Other long term (current) drug therapy: Secondary | ICD-10-CM | POA: Insufficient documentation

## 2021-01-05 DIAGNOSIS — M79604 Pain in right leg: Secondary | ICD-10-CM | POA: Diagnosis not present

## 2021-01-05 DIAGNOSIS — R531 Weakness: Secondary | ICD-10-CM | POA: Diagnosis not present

## 2021-01-05 DIAGNOSIS — M1711 Unilateral primary osteoarthritis, right knee: Secondary | ICD-10-CM | POA: Diagnosis not present

## 2021-01-05 DIAGNOSIS — R5381 Other malaise: Secondary | ICD-10-CM | POA: Diagnosis not present

## 2021-01-05 LAB — BASIC METABOLIC PANEL
Anion gap: 9 (ref 5–15)
BUN: 27 mg/dL — ABNORMAL HIGH (ref 8–23)
CO2: 25 mmol/L (ref 22–32)
Calcium: 9.2 mg/dL (ref 8.9–10.3)
Chloride: 107 mmol/L (ref 98–111)
Creatinine, Ser: 1.12 mg/dL — ABNORMAL HIGH (ref 0.44–1.00)
GFR, Estimated: 54 mL/min — ABNORMAL LOW (ref >60)
Glucose, Bld: 103 mg/dL — ABNORMAL HIGH (ref 70–99)
Potassium: 3.3 mmol/L — ABNORMAL LOW (ref 3.5–5.1)
Sodium: 141 mmol/L (ref 135–145)

## 2021-01-05 LAB — CBC
HCT: 37.5 % (ref 36.0–46.0)
Hemoglobin: 12.2 g/dL (ref 12.0–15.0)
MCH: 29.1 pg (ref 26.0–34.0)
MCHC: 32.5 g/dL (ref 30.0–36.0)
MCV: 89.5 fL (ref 80.0–100.0)
Platelets: 223 10*3/uL (ref 150–400)
RBC: 4.19 MIL/uL (ref 3.87–5.11)
RDW: 14.6 % (ref 11.5–15.5)
WBC: 5.5 10*3/uL (ref 4.0–10.5)
nRBC: 0 % (ref 0.0–0.2)

## 2021-01-05 MED ORDER — LACTATED RINGERS IV BOLUS
500.0000 mL | Freq: Once | INTRAVENOUS | Status: AC
  Administered 2021-01-05: 500 mL via INTRAVENOUS

## 2021-01-05 MED ORDER — ACETAMINOPHEN 500 MG PO TABS: 500.0000 mg | ORAL_TABLET | Freq: Four times a day (QID) | ORAL | 0 refills | Status: DC | PRN

## 2021-01-05 MED ORDER — POTASSIUM CHLORIDE 20 MEQ PO PACK
40.0000 meq | PACK | Freq: Once | ORAL | Status: AC
  Administered 2021-01-05: 40 meq via ORAL
  Filled 2021-01-05: qty 2

## 2021-01-05 NOTE — ED Provider Notes (Signed)
I saw and evaluated the patient, reviewed the resident's note and I agree with the findings and plan.  EKG Interpretation  Date/Time:  Saturday January 05 2021 15:40:00 EDT Ventricular Rate:  53 PR Interval:  164 QRS Duration: 78 QT Interval:  432 QTC Calculation: 405 R Axis:   1 Text Interpretation: Sinus bradycardia Septal infarct , age undetermined Abnormal ECG No old tracing to compare Confirmed by Lacretia Leigh [phone]) on 01/05/2021 6:70:70 PM   65 year old female presents with pain to her right arm is worse with certain positions.  On exam she has good strength.  Suspect musculoskeletal etiology and will discharge   Lacretia Leigh, MD 01/05/21 1816

## 2021-01-05 NOTE — ED Notes (Signed)
DC instructions reviewed with pt. Pt verbalized understanding.  PT DC.  

## 2021-01-05 NOTE — ED Notes (Signed)
Called [facility],  2nd on list

## 2021-01-05 NOTE — ED Triage Notes (Addendum)
Pt to triage via [facility].  R hand and R wrist pain x 1 week.  Now reports pain all over.  Ambulatory with walker.  No known injury.
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
## 2021-01-05 NOTE — Discharge Instructions (Addendum)
-   Please take tylenol as needed for pain - You should follow up with your primary doctor this week. It is likely you can start additional medication for chronic pain, but they will know which medications are best for you.

## 2021-01-06 ENCOUNTER — Telehealth: Payer: Self-pay | Admitting: Student

## 2021-01-06 ENCOUNTER — Encounter (HOSPITAL_COMMUNITY): Payer: Self-pay | Admitting: Emergency Medicine

## 2021-01-06 ENCOUNTER — Emergency Department (HOSPITAL_COMMUNITY)
Admission: EM | Admit: 2021-01-06 | Discharge: 2021-01-06 | Disposition: A | Discharge status: Home / Self Care | Payer: Medicare Other | Attending: Emergency Medicine | Admitting: Emergency Medicine

## 2021-01-06 DIAGNOSIS — M2550 Pain in unspecified joint: Secondary | ICD-10-CM | POA: Diagnosis not present

## 2021-01-06 DIAGNOSIS — R52 Pain, unspecified: Secondary | ICD-10-CM | POA: Diagnosis not present

## 2021-01-06 DIAGNOSIS — N183 Chronic kidney disease, stage 3 unspecified: Secondary | ICD-10-CM | POA: Insufficient documentation

## 2021-01-06 DIAGNOSIS — M79602 Pain in left arm: Secondary | ICD-10-CM | POA: Diagnosis not present

## 2021-01-06 DIAGNOSIS — I129 Hypertensive chronic kidney disease with stage 1 through stage 4 chronic kidney disease, or unspecified chronic kidney disease: Secondary | ICD-10-CM | POA: Diagnosis not present

## 2021-01-06 DIAGNOSIS — Z743 Need for continuous supervision: Secondary | ICD-10-CM | POA: Diagnosis not present

## 2021-01-06 DIAGNOSIS — M791 Myalgia, unspecified site: Secondary | ICD-10-CM | POA: Diagnosis not present

## 2021-01-06 DIAGNOSIS — M79604 Pain in right leg: Secondary | ICD-10-CM | POA: Diagnosis not present

## 2021-01-06 DIAGNOSIS — M79605 Pain in left leg: Secondary | ICD-10-CM | POA: Insufficient documentation

## 2021-01-06 DIAGNOSIS — Z79899 Other long term (current) drug therapy: Secondary | ICD-10-CM | POA: Diagnosis not present

## 2021-01-06 DIAGNOSIS — Z7401 Bed confinement status: Secondary | ICD-10-CM | POA: Diagnosis not present

## 2021-01-06 DIAGNOSIS — Z7982 Long term (current) use of aspirin: Secondary | ICD-10-CM | POA: Diagnosis not present

## 2021-01-06 DIAGNOSIS — R5381 Other malaise: Secondary | ICD-10-CM | POA: Diagnosis not present

## 2021-01-06 DIAGNOSIS — M79601 Pain in right arm: Secondary | ICD-10-CM | POA: Insufficient documentation

## 2021-01-06 DIAGNOSIS — R69 Illness, unspecified: Secondary | ICD-10-CM | POA: Diagnosis not present

## 2021-01-06 MED ORDER — METHOCARBAMOL 500 MG PO TABS: 500.0000 mg | ORAL_TABLET | Freq: Four times a day (QID) | ORAL | 0 refills | Status: DC

## 2021-01-06 MED ORDER — ACETAMINOPHEN 500 MG PO TABS: 500.0000 mg | ORAL_TABLET | Freq: Four times a day (QID) | ORAL | 0 refills | Status: DC | PRN

## 2021-01-06 NOTE — ED Triage Notes (Signed)
Pt to triage via [facility] from home.  Reports generalized muscle aches.  Seen in ED yesterday for same and states she was told that she was supposed to get a Rx for a muscle relaxer but didn't receive one.

## 2021-01-06 NOTE — Telephone Encounter (Signed)
  Reason for call:   I placed an outgoing call to Ms. Moneisha K [place] at 1050 AM regarding diffuse pain   Assessment/ Plan:   Patient was seen in the ED yesterday for diffuse pain.  She had a full work-up, was thought to be MSK in etiology.  She states that this is different from her chronic pain.  She states she hurts all over.  She denies fever chills chest pain cough shortness of breath or difficulty urinating.  In the ED she had a bump in her creatinine, thought to be secondary to decreased p.o. intake.  She does endorse not drinking a lot of fluids recently.  She was given IV fluids while there.  She states that her home Vicodin is not touching the pain.  She also requested muscle relaxer, and states the ED provider told her that they would write her for some.  She denies ever receiving a prescription.  This information is not either in the resident or ED providers notes.  Discussed with her that because I did not not evaluate her I was unable to write for these, and that because muscle relaxers can cause an increased risk for falls, I did not feel comfortable writing for these.  I did discuss with her that she can take Tylenol and use heat for the pain.  I will message the clinic to have her be called for a appointment.  Also discussed with her if she feels like the pain is so unbearable that she is unable to tolerate it, she can go back to the emergency department.  Otherwise we will see her in the clinic.  I do not believe that this is a medical emergency at this time and that she needs to be seen urgently.  As always, pt is advised that if symptoms worsen or new symptoms arise, they should go to an urgent care facility or to to ER for further evaluation.   Riesa Pope, MD   01/06/2021, 11:01 AM
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
## 2021-01-06 NOTE — ED Notes (Signed)
Called [facility] added to the list

## 2021-01-06 NOTE — ED Notes (Signed)
[facility] here to transport back to care facility,

## 2021-01-06 NOTE — Discharge Instructions (Signed)
Please read and follow all provided instructions.  Your diagnoses today include:  1. Myalgia     Tests performed today include:  Vital signs. See below for your results today.   Medications prescribed:   Robaxin (methocarbamol) - muscle relaxer medication  DO NOT drive or perform any activities that require you to be awake and alert because this medicine can make you drowsy.    Tylenol - medication for pain  Take any prescribed medications only as directed.  Home care instructions:  Follow any educational materials contained in this packet.  BE VERY CAREFUL not to take multiple medicines containing Tylenol (also called acetaminophen). Doing so can lead to an overdose which can damage your liver and cause liver failure and possibly death.   Follow-up instructions: Please follow-up with your primary care provider in the next 3 days for further evaluation of your symptoms.   Return instructions:   Please return to the Emergency Department if you experience worsening symptoms.   Please return if you have any other emergent concerns.  Additional Information:  Your vital signs today were: BP (!) 153/81 (BP Location: Left Arm)   Pulse 62   Temp 98.3 F (36.8 C)   Resp 16   SpO2 96%  If your blood pressure (BP) was elevated above 135/85 this visit, please have this repeated by your doctor within one month. --------------

## 2021-01-08 ENCOUNTER — Ambulatory Visit: Payer: Medicare Other | Admitting: Podiatry

## 2021-01-14 ENCOUNTER — Telehealth: Payer: Self-pay

## 2021-01-14 NOTE — Telephone Encounter (Signed)
Agree thank you 

## 2021-01-14 NOTE — Telephone Encounter (Signed)
RTC, patient states she went to the ED last week for muscle pain.  She is wanting to know if there is anyway she can go into inpatient rehab therapy.  Patient has an upcoming appt with PCP on 01/24/21.  She was encouraged to keep this appt and discuss pain and therapy needs with PCP next week.  She verbalized understanding. SChaplin, RN,BSN

## 2021-01-14 NOTE — Telephone Encounter (Signed)
Pt is requesting a call back  She is requesting to speak to someone about going to therapy ( rehab)

## 2021-01-15 ENCOUNTER — Telehealth: Payer: Self-pay

## 2021-01-15 NOTE — Telephone Encounter (Signed)
See next encounter.

## 2021-01-15 NOTE — Telephone Encounter (Signed)
Pt is requesting a call back she is requesting Vicodin

## 2021-01-15 NOTE — Telephone Encounter (Signed)
A RN from [facility] is requesting a call back

## 2021-01-15 NOTE — Telephone Encounter (Signed)
Return call to [place], [facility]. She stated pt went to the ED for right side pain and unable to move. So she had PT to visit pt for pain management. Kathy Owens told her she takes Vicodin for pain so she ask the pt to get the bottle for her. Kathy Owens stated she did not know pt is on Vicodin. The pt did not have the pills in the original bottle but in a clean urine cup. She told the PT person her sister is a drug addict and she does not want her sister to take her pills. PT looked at the pills; something did not seem right soshe took a picture and sent it to [place]. They both realized it was not Vicodin; b/c Lactate was on the pills. Kathy Owens said unsure how long she had been taking lactate pills; which could explain her pain when she had to go to the ED. Kathy Owens called the pharmacy -she was told last filled 12/28/20 with 120 pills,pt's sister picked it up per Kathy Owens When Kathy Owens confronted Kathy Owens about what might had happened; Kathy Owens changed her story stating her sister did not pick it up her medication. Since Kathy Owens has no Vicodin until next month; Kathy Owens stated will try Tylenol and Robaxin every 6 hrs for her pain x 1 week; then f/u with Kathy Owens to see how she is doing. Also Kathy Owens will look into changing pharmacy to [facility]home delivery, if there's no copay. Kathy Owens stated she wanted to update Kathy Owens's doctor.

## 2021-01-15 NOTE — Telephone Encounter (Signed)
Return pt's call - no answer; left message to call the office back if she has any questions.

## 2021-01-16 ENCOUNTER — Telehealth: Payer: Self-pay
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
## 2021-01-16 NOTE — Telephone Encounter (Signed)
Returned call to pt- No answer-message left on recorder for return call.Regenia Skeeter, Darcie Mellone Cassady3/30/202211:30 AM
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
## 2021-01-24 ENCOUNTER — Ambulatory Visit (INDEPENDENT_AMBULATORY_CARE_PROVIDER_SITE_OTHER): Payer: Medicare Other | Admitting: Internal Medicine

## 2021-01-24 VITALS — BP 148/67 | HR 66 | Temp 98.0°F | Ht 59.0 in

## 2021-01-24 DIAGNOSIS — R42 Dizziness and giddiness: Secondary | ICD-10-CM

## 2021-01-24 DIAGNOSIS — G808 Other cerebral palsy: Secondary | ICD-10-CM

## 2021-01-24 DIAGNOSIS — G894 Chronic pain syndrome: Secondary | ICD-10-CM | POA: Diagnosis not present

## 2021-01-24 DIAGNOSIS — I10 Essential (primary) hypertension: Secondary | ICD-10-CM | POA: Diagnosis not present

## 2021-01-24 DIAGNOSIS — N183 Chronic kidney disease, stage 3 unspecified: Secondary | ICD-10-CM | POA: Diagnosis not present

## 2021-01-24 DIAGNOSIS — Z0000 Encounter for general adult medical examination without abnormal findings: Secondary | ICD-10-CM

## 2021-01-24 DIAGNOSIS — M62838 Other muscle spasm: Secondary | ICD-10-CM

## 2021-01-24 DIAGNOSIS — M654 Radial styloid tenosynovitis [de Quervain]: Secondary | ICD-10-CM | POA: Diagnosis not present

## 2021-01-24 DIAGNOSIS — E7841 Elevated Lipoprotein(a): Secondary | ICD-10-CM | POA: Diagnosis not present

## 2021-01-24 DIAGNOSIS — F1120 Opioid dependence, uncomplicated: Secondary | ICD-10-CM

## 2021-01-24 DIAGNOSIS — I129 Hypertensive chronic kidney disease with stage 1 through stage 4 chronic kidney disease, or unspecified chronic kidney disease: Secondary | ICD-10-CM

## 2021-01-24 DIAGNOSIS — M810 Age-related osteoporosis without current pathological fracture: Secondary | ICD-10-CM | POA: Diagnosis not present

## 2021-01-24 DIAGNOSIS — E538 Deficiency of other specified B group vitamins: Secondary | ICD-10-CM

## 2021-01-24 NOTE — Telephone Encounter (Signed)
Thank you.  Office visit today.  [name] agreed with not taking the Vicodin if she continues the Robaxin.  No refill written.

## 2021-01-24 NOTE — Telephone Encounter (Signed)
Office visit today, she asked about Vicodin but I informed her of risk of harm if combined with muscle relaxant.  She prefers the muscle relaxant.  Vicodin not refilled.

## 2021-01-25 LAB — BMP8+ANION GAP
Anion Gap: 19 mmol/L — ABNORMAL HIGH (ref 10.0–18.0)
BUN/Creatinine Ratio: 27 (ref 12–28)
BUN: 27 mg/dL (ref 8–27)
CO2: 21 mmol/L (ref 20–29)
Calcium: 9.5 mg/dL (ref 8.7–10.3)
Chloride: 101 mmol/L (ref 96–106)
Creatinine, Ser: 0.99 mg/dL (ref 0.57–1.00)
Glucose: 107 mg/dL — ABNORMAL HIGH (ref 65–99)
Potassium: 4.2 mmol/L (ref 3.5–5.2)
Sodium: 141 mmol/L (ref 134–144)
eGFR: 63 mL/min/{1.73_m2} (ref >59)

## 2021-01-28 ENCOUNTER — Telehealth: Payer: Self-pay | Admitting: Internal Medicine

## 2021-01-28 NOTE — Telephone Encounter (Signed)
Pt requesting a call back about her test results.

## 2021-01-29 ENCOUNTER — Telehealth: Payer: Self-pay | Admitting: *Deleted

## 2021-01-29 NOTE — Telephone Encounter (Signed)
Pt calling to follow up with a Sleeve for her right arm, test results, and a anti inflammatory medication that was to be called in.  Pt also states her medicine was to be called into    [facility] in [place], [place] in: [place] Address: [address], [place], [place] [address] Phone: [phone]

## 2021-01-29 NOTE — Telephone Encounter (Signed)
Dr. Jimmye Norman, your note has not been written from 01/24/2021. Were you planning on ordering a compression sleeve for this patient? Please see Ch[place]'s note below.

## 2021-01-30 ENCOUNTER — Other Ambulatory Visit: Payer: Self-pay | Admitting: Internal Medicine

## 2021-01-30 ENCOUNTER — Encounter: Payer: Self-pay | Admitting: Internal Medicine

## 2021-01-30 DIAGNOSIS — M654 Radial styloid tenosynovitis [de Quervain]: Secondary | ICD-10-CM | POA: Insufficient documentation

## 2021-01-30 DIAGNOSIS — M62838 Other muscle spasm: Secondary | ICD-10-CM | POA: Insufficient documentation

## 2021-01-30 HISTORY — DX: Other muscle spasm: M62.838

## 2021-01-30 MED ORDER — ACETAMINOPHEN 500 MG PO TABS: 500.0000 mg | ORAL_TABLET | Freq: Four times a day (QID) | ORAL | 5 refills | Status: DC | PRN

## 2021-01-30 MED ORDER — VITAMIN D3 1.25 MG (50000 UT) PO TABS: 50000.0000 [IU] | ORAL_TABLET | ORAL | 0 refills | Status: AC

## 2021-01-30 MED ORDER — DICLOFENAC SODIUM 1.6 % EX GEL
2.0000 g | Freq: Four times a day (QID) | CUTANEOUS | 1 refills | Status: DC | PRN
Start: 2021-01-30 — End: 2021-02-01

## 2021-01-30 MED ORDER — VITAMIN D3 50 MCG (2000 UT) PO TABS: 50.0000 ug | ORAL_TABLET | Freq: Every day | ORAL | 0 refills | Status: DC

## 2021-01-30 NOTE — Progress Notes (Signed)
ED f/u visit - recent visits for severe pain diagnosed as muscle spasm - R leg, and also pain in R forearm/wrist.  All improving.  Doing well otherwise.  She requests that her renal function be rechecked as it was slightly worsened during ED eval.  Continues to live alone and explains that she is doing ok with ADLs and declines any additional help.  She doesn't leave the house often.  Has changed pharmacies to one which delivers which will be more convenient for her.  Patient Active Problem List   Diagnosis Date Noted  . Uncomplicated opioid dependence ([place]) 07/12/2020  . Lactose intolerance in adult (presumptive dx) 07/12/2020  . H/O impacted cerumen 07/12/2020  . B12 deficiency 06/20/2019  . 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 Goals of care, counseling/discussion 07/29/2018  . Non-arteritic AION (anterior ischemic optic neuropathy), bilateral 07/01/2017  . Chronic pain 12/13/2015  . Chronic renal insufficiency, stage 3 (moderate) ([place]) 08/04/2014  . Vertigo 07/06/2014  . Subacromial impingement of right shoulder 08/10/2013  . Health care maintenance 07/29/2011  . Essential hypertension 02/15/2007  . Osteoporosis 02/15/2007  . Hyperlipemia 10/09/2006  . Cerebral palsy ([place]) 10/09/2006  . Venous insufficiency of left leg 10/09/2006    Current Outpatient Medications:  .  acetaminophen (TYLENOL) 500 MG tablet, Take 1 tablet (500 mg total) by mouth every 6 (six) hours as needed., Disp: 100 tablet, Rfl: 5 .  alendronate (FOSAMAX) 35 MG tablet, Take 1 tablet (35 mg total) by mouth every 7 (seven) days. Take with a full glass of water on an empty stomach. Stay upright for atleast 30 minutes., Disp: 12 tablet, Rfl: 3 .  aspirin 81 MG EC tablet, Take 1 tablet (81 mg total) by mouth daily., Disp: 30 tablet, Rfl: 11 .  atorvastatin (LIPITOR) 10 MG tablet, TAKE 1 TABLET(10 MG) BY MOUTH DAILY, Disp: 90 tablet, Rfl: 3 .  Diclofenac Sodium 1.6 % GEL, Apply 2 g topically 4 (four) times daily as needed  for up to 10 days. Apply to painful area of your wrist., Disp: 60 g, Rfl: 1 .  loperamide (IMODIUM) 2 MG capsule, Take 1 capsule (2 mg total) by mouth 2 (two) times daily as needed for diarrhea or loose stools., Disp: 30 capsule, Rfl: 3 .  methocarbamol (ROBAXIN) 500 MG tablet, Take 1 tablet (500 mg total) by mouth 4 (four) times daily., Disp: 20 tablet, Rfl: 0 .  triamterene-hydrochlorothiazide (MAXZIDE-25) 37.5-25 MG tablet, Take 1 tablet by mouth daily., Disp: 90 tablet, Rfl: 3   BP (!) 148/67 (BP Location: Left Arm, Patient Position: Sitting, Cuff Size: Small)   Pulse 66   Temp 98 F (36.7 C) (Oral)   Ht 4\' 11"  (1.499 m)   SpO2 99%   BMI 28.98 kg/m    Kathy Owens was sitting comfortably in her wheelchair, somewhat anxious, eager to describe the discomfort she has been having.  She indicates ongoing discomfort in the right wrist.  Tenderness noted over the thumb extensor tendon which is exacerbated with extension against resistance.  No swelling or warmth.  Remainder of the wrist is nontender.  Forearm is palpated as are fingers with no abnormalities elicited.  She has increased tone in the lower extremities which is chronic.  No tenderness or swelling/thickening of muscle groups of the right lower extremity where she had experienced her former discomfort.  She redemonstrates chronic reduction of range of motion of the left shoulder related to arthritis.  Appears euvolemic.  Feet in good condition.  A/P-please also see  problem based documentation.  Care Gaps - absence of Covid vaccine (she has actually been vaccinated but her data hasn't been entered).  Declines mammography, PAP, colonoscopy due to positioning challenges and desire to avoid these tests.    Doing better following two recent visits to ED for severe pain.  At next visit, add amlodipine if BP remains elevated.  Begin vit D supplementation and discuss her thoughts about bisphosphonate (she had initially been resistant but may have  changed her mind.)  Recheck ankle tone to determine if AFOs would be helpful.

## 2021-01-30 NOTE — Assessment & Plan Note (Signed)
Severe vitamin D deficiency noted Fall 2021 (13.7).  I had missed the opportunity to discuss this with her.  SHe will need high dose weekly supplementation for 4 weeks followed by 2000 IU daily with recheck in 84M.  I will need to phone her with this new recommendation (she had left clinic prior to recognition of problem).

## 2021-01-30 NOTE — Assessment & Plan Note (Signed)
Where is your pain?  "Everywhere.  My shoulders, my hips, my knees, my feet".  Long term opioid use without request for early refills.  There are some communications of concern by [facility] staff who question her use.  When offered a choice between an opioid refill and continuing with the more recent robaxin (explained the risks of concurrent use), she chose the robaxin as she has experienced more relief.  Chronic pain is common in CP and treatment is important for preserved independence and QOL.  Continue to monitor.

## 2021-01-30 NOTE — Assessment & Plan Note (Signed)
Suboptimal control on thiazide alone.  Not addressed this visit but will plan addition of low dose amlodipine next.

## 2021-01-30 NOTE — Assessment & Plan Note (Signed)
Recent episode of sudden onset leg muscle pain and inability to move it; presented to ED.  Muscle relaxants have nearly resolved the problem.  Increased tone in legs but no current spasm on exam.  She may continue the robaxin (started in ED) as needed but we discussed need to avoid concurrent opioids.  When given the option, she preferred the robaxin over her opioid.  Monitor, as this may change.

## 2021-01-30 NOTE — Telephone Encounter (Signed)
Call from Larena Glassman (Nurse with [facility])-stating pt thought MD was going to send in "anti-inflammatory" for her.  Based on previous notes, CMA recommend pt use Voltaren gel and tylenol.   Nurse also wanted to make office aware that pt's [facility]. CMA will change pharmacy and send to pcp to see if pt is to take anything other than Voltaren and tylenol.

## 2021-01-30 NOTE — Assessment & Plan Note (Signed)
Recent mild worsening of renal fxn noted in ED, recheck today (resulted improved, eGFR 63).

## 2021-01-30 NOTE — Assessment & Plan Note (Signed)
New diagnosis, treat with topical NSAID and wrist sleeve for comfort.

## 2021-01-30 NOTE — Assessment & Plan Note (Signed)
Not recently symptomatic.  Will maintain on problem list for awhile longer.

## 2021-01-30 NOTE — Assessment & Plan Note (Signed)
She declines all future screenings for breast ca, colon ca, and cervical ca.  Flu shot received last Fall.  Covid vaccinated (we don't have documentation).  Due for pneumococcal vaccine.

## 2021-01-30 NOTE — Telephone Encounter (Signed)
Insurance may pay for voltaren-please send in rx.  If not, pt will get otc.Regenia Skeeter, Azucena Dart Cassady4/13/202210:06 AM

## 2021-01-30 NOTE — Assessment & Plan Note (Signed)
Chronic and stable.  Muscle spasms recently which led to ED visit, responded well to muscle relaxant.  Baseline at today's visit.  She continues to manage her ADLs independently and declines assistance in the home "until absolutely necessary'.

## 2021-01-30 NOTE — Assessment & Plan Note (Signed)
Lipid panel 07/2020; primary prevention, continue statin.

## 2021-01-30 NOTE — Assessment & Plan Note (Signed)
B12 level last checked Fall 2021 > 2000, will resolve problem

## 2021-01-31 ENCOUNTER — Telehealth: Payer: Self-pay | Admitting: *Deleted

## 2021-01-31 NOTE — Telephone Encounter (Signed)
Call from th pharmacist at [facility]. Stated they do not have nor can find Diclofenac 1.6 % gel. Requesting new rx sent for Diclofenac 1 % with the same  instructions. Thanks

## 2021-01-31 NOTE — Telephone Encounter (Signed)
Order for right wrist Neoprene compression splint/sleeve along with demographics and 01/24/21 OV notes faxed to the [facility] at [phone]. Fax confirmation receipt received.

## 2021-01-31 NOTE — Telephone Encounter (Signed)
Kathy Owens requesting to speak with a nurse about Diclofenac. Want to know can they go ahead and fill the Diclofenac 1% instead 1.6, please call back.

## 2021-01-31 NOTE — Telephone Encounter (Signed)
This request was already sent to PCP. SChaplin, RN,BSN

## 2021-02-01 ENCOUNTER — Other Ambulatory Visit: Payer: Self-pay | Admitting: Internal Medicine

## 2021-02-01 DIAGNOSIS — M654 Radial styloid tenosynovitis [de Quervain]: Secondary | ICD-10-CM

## 2021-02-01 MED ORDER — DICLOFENAC SODIUM 1 % EX GEL: 2.0000 g | Freq: Four times a day (QID) | CUTANEOUS | 1 refills | Status: DC | PRN

## 2021-02-01 NOTE — Telephone Encounter (Signed)
done

## 2021-02-12 ENCOUNTER — Encounter: Payer: Self-pay | Admitting: Podiatry

## 2021-02-12 ENCOUNTER — Ambulatory Visit (INDEPENDENT_AMBULATORY_CARE_PROVIDER_SITE_OTHER): Payer: Medicare Other | Admitting: Podiatry

## 2021-02-12 ENCOUNTER — Other Ambulatory Visit: Payer: Self-pay

## 2021-02-12 DIAGNOSIS — N2889 Other specified disorders of kidney and ureter: Secondary | ICD-10-CM

## 2021-02-12 DIAGNOSIS — M79674 Pain in right toe(s): Secondary | ICD-10-CM | POA: Diagnosis not present

## 2021-02-12 DIAGNOSIS — M79675 Pain in left toe(s): Secondary | ICD-10-CM | POA: Diagnosis not present

## 2021-02-12 DIAGNOSIS — I872 Venous insufficiency (chronic) (peripheral): Secondary | ICD-10-CM

## 2021-02-12 DIAGNOSIS — N183 Chronic kidney disease, stage 3 unspecified: Secondary | ICD-10-CM | POA: Diagnosis not present

## 2021-02-12 DIAGNOSIS — G808 Other cerebral palsy: Secondary | ICD-10-CM

## 2021-02-12 DIAGNOSIS — B351 Tinea unguium: Secondary | ICD-10-CM

## 2021-02-12 NOTE — Progress Notes (Signed)
This patient returns to the office for evaluation and treatment of long thick painful nails .  This patient is unable to trim her own nails since the patient cannot reach her feet.  Patient says the nails are painful walking and wearing his shoes. She returns for preventive foot care services. Patient has venous insufficiency and kidney disease.  General Appearance  Alert, conversant and in no acute stress.  Vascular  Dorsalis pedis and posterior tibial  pulses are palpable  bilaterally.  Capillary return is within normal limits  bilaterally. Temperature is within normal limits  bilaterally.  Neurologic  Senn-Weinstein monofilament wire test within normal limits  bilaterally. Muscle power within normal limits bilaterally.  Nails Thick disfigured discolored nails with subungual debris  from hallux to fifth toes bilaterally. No evidence of bacterial infection or drainage bilaterally.  Orthopedic  No limitations of motion  feet .  No crepitus or effusions noted.  No bony pathology or digital deformities noted. Severe HAV  B/L.  Hammer toes  B/L.   Flattened foot profile right foot with prominent navicular.  Skin  normotropic skin with no porokeratosis noted bilaterally.  No signs of infections or ulcers noted.   Callus sub 1st  MCJ right foot asymptomatic.  Onychomycosis  Pain in toes right foot  Pain in toes left foot  Debridement  of nails  1-5  B/L with a nail nipper.  Nails were then filed using a dremel tool with no incidents.    RTC   3 months   Gardiner Barefoot DPM

## 2021-02-21 ENCOUNTER — Other Ambulatory Visit: Payer: Self-pay

## 2021-02-21 MED ORDER — METHOCARBAMOL 500 MG PO TABS: 500.0000 mg | ORAL_TABLET | Freq: Four times a day (QID) | ORAL | 0 refills | Status: DC

## 2021-02-21 MED ORDER — LOPERAMIDE HCL 2 MG PO CAPS: 2.0000 mg | ORAL_CAPSULE | Freq: Two times a day (BID) | ORAL | 3 refills | Status: DC | PRN

## 2021-02-21 NOTE — Telephone Encounter (Signed)
Need refill on ;pt contact muscle relaxer, loperamide (IMODIUM) 2 MG capsule(Expired)   [facility] [address] [place]

## 2021-02-22 ENCOUNTER — Other Ambulatory Visit: Payer: Self-pay | Admitting: Internal Medicine

## 2021-02-22 ENCOUNTER — Telehealth: Payer: Self-pay

## 2021-02-22 DIAGNOSIS — M654 Radial styloid tenosynovitis [de Quervain]: Secondary | ICD-10-CM

## 2021-02-22 DIAGNOSIS — K529 Noninfective gastroenteritis and colitis, unspecified: Secondary | ICD-10-CM

## 2021-02-22 DIAGNOSIS — G800 Spastic quadriplegic cerebral palsy: Secondary | ICD-10-CM

## 2021-02-22 MED ORDER — METHOCARBAMOL 500 MG PO TABS: 500.0000 mg | ORAL_TABLET | Freq: Three times a day (TID) | ORAL | 5 refills | Status: DC | PRN

## 2021-02-22 MED ORDER — LOPERAMIDE HCL 2 MG PO CAPS: 2.0000 mg | ORAL_CAPSULE | Freq: Every day | ORAL | 3 refills | Status: DC | PRN

## 2021-02-22 NOTE — Telephone Encounter (Signed)
Received TC from patient who states her RX's that were filled yesterday for Imodium and methocarbamol were sent in with the wrong quantity. States she usually gets a 30 day supply on both medications which should be # 60 on the imodium and #120 on the methocarbamol.  She has not picked up the RX's because she is hoping MD will send in new RX's.  Forwarding to PCP.   SChaplin, RN,BSN

## 2021-02-28 NOTE — Telephone Encounter (Signed)
Pls contact pt regarding issues also with her pain medicine [phone]

## 2021-02-28 NOTE — Telephone Encounter (Signed)
Return pt's call -  Pt asking about her muscle relaxant, Robaxin. Informed pt it was sent 5/6 qty# 90 to [facility] along with Imodium qty# 90. Stated she does not use [facility] but [facility]. Stated she will call and have them transferred to [facility]. I told her I will take [facility] off her chart.

## 2021-03-20 ENCOUNTER — Telehealth: Payer: Self-pay | Admitting: *Deleted

## 2021-03-20 NOTE — Chronic Care Management (AMB) (Signed)
  Care Management   Outreach Note  03/20/2021 Name: Annaliesa K [place] MRN: [id number] DOB: Feb 18, 1956  Referred by: Angelica Pou, MD Reason for referral : Care Coordination (Outreach to schedule initial call with BSW was unsuccessful [facility] List )   An unsuccessful telephone outreach was attempted today. The patient was referred to the case management team for assistance with care management and care coordination.   Follow Up Plan: The care management team will reach out to the patient again over the next 7 days. If patient returns call to provider office, please advise to call [place] at [phone].  [place] [facility]

## 2021-03-27 NOTE — Chronic Care Management (AMB) (Signed)
  Care Management   Note  03/27/2021 Name: Kathy Owens MRN: [id number] DOB: 1956/04/22  Kathy Owens is a 65 y.o. year old female who is a primary care patient of Angelica Pou, MD. I reached out to [name] by phone today in response to a referral sent by [name]'s PCP, Dr Jimmye Norman.     [name] was given information about care management services today including:  1. Care management services include personalized support from designated clinical staff supervised by her physician, including individualized plan of care and coordination with other care providers 2. 24/7 contact phone numbers for assistance for urgent and routine care needs. 3. The patient may stop care management services at any time by phone call to the office staff.  Patient agreed to services and verbal consent obtained.   Follow up plan: Telephone appointment with care management team member scheduled for:04/10/2021  [place] [facility]

## 2021-04-02 ENCOUNTER — Telehealth: Payer: Self-pay | Admitting: *Deleted

## 2021-04-02 NOTE — Telephone Encounter (Addendum)
Information was sent through CoverMyMeds for PA for Methocarbamol 500 mg tablets Awaiting decision within 24-72 hours.  Sander Nephew, RN 04/02/2021 4:28 PM.  PA for Methocarbamol was approved 04/03/2021 thru 10/19/2021.  Sander Nephew, RN 04/09/2021 8:33 AM.

## 2021-04-10 ENCOUNTER — Ambulatory Visit: Payer: Medicare Other | Admitting: Licensed Clinical Social Worker

## 2021-04-10 NOTE — Chronic Care Management (AMB) (Signed)
  Care Management   Social Work Visit Note  04/10/2021 Name: Kathy Owens MRN: [id number] DOB: September 14, 1956  Kathy Owens is a 65 y.o. year old female who sees Jimmye Norman, Elaina Pattee, MD for primary care. The care management team was consulted for assistance with care management and care coordination needs related to  initial outreach.    Patient was given the following information about care management and care coordination services today, agreed to services, and gave verbal consent: 1.care management/care coordination services include personalized support from designated clinical staff supervised by their physician, including individualized plan of care and coordination with other care providers 2. 24/7 contact phone numbers for assistance for urgent and routine care needs. 3. The patient may stop care management/care coordination services at any time by phone call to the office staff.  Engaged with patient by telephone for initial visit in response to provider referral for social work chronic care management and care coordination services.  Assessment: Review of patient history, allergies, and health status during evaluation of patient need for care management/care coordination services.    Interventions:  Patient interviewed and appropriate assessments performed Collaborated with clinical team regarding patient needs  Patient was previously enrolled with CCM services. Patient stated she is familiar with program and doesn't require any services from program. SW completed SDOH screening and unable to determine any needs. Patient stated she has plenty of food, transportation and housing is not a concern. Patient denied any needs. Patient stated she would contact SW if services are needed.  SDOH (Social Determinants of Health) assessments performed: Yes     Plan:  [place]  removed from patient's Care Team per patients request. SW provided patient with SW contact information.     Milus Height, [place]  Social Worker [facility] Care Management  [phone]

## 2021-04-10 NOTE — Patient Instructions (Signed)
Visit Information  Instructions:  Patient will contact SW if needed.  Patient was given the following information about care management and care coordination services today, agreed to services, and gave verbal consent: 1.care management/care coordination services include personalized support from designated clinical staff supervised by their physician, including individualized plan of care and coordination with other care providers 2. 24/7 contact phone numbers for assistance for urgent and routine care needs. 3. The patient may stop care management/care coordination services at any time by phone call to the office staff.  Patient verbalizes understanding of instructions provided today and agrees to view in [place].   The patient has been provided with contact information for the care management team and has been advised to call with any health related questions or concerns.    Milus Height, [place]  Social Worker [facility] Care Management  [phone]

## 2021-04-23 ENCOUNTER — Encounter: Payer: Self-pay | Admitting: *Deleted

## 2021-05-09 ENCOUNTER — Telehealth: Payer: Self-pay

## 2021-05-09 DIAGNOSIS — G894 Chronic pain syndrome: Secondary | ICD-10-CM

## 2021-05-09 NOTE — Telephone Encounter (Signed)
TC to patient, VM obtained and message left that triage nurse was returning her call. SChaplin, RN,BSN

## 2021-05-09 NOTE — Telephone Encounter (Signed)
Requesting to speak with a nurse about meds, please call pt back.  

## 2021-05-09 NOTE — Telephone Encounter (Signed)
Pt returning call. Please call back.

## 2021-05-09 NOTE — Telephone Encounter (Signed)
RTC, patient states that PCP informed her at her last visit that "it wasn't a good idea to take muscle relaxers and vicodin".  States she is taking the Robaxin w an extra strength tylenol now and it doesn't help her back pain at all.  Describes her back pain as intermittent, aches to lower back.  She is requesting to take Vicodin again and states she will stop the muscle relaxers.  She states in the past, Dr. Lynnae January was prescribing Vicodin 10mg , but patient requested to drop the dose down to 5mg .  She now states the 10mg  Vicodin worked better.  Informed patient RN will send her request to PCP to advise. Thank you, SChaplin, RN,BSN

## 2021-05-16 MED ORDER — HYDROCODONE-ACETAMINOPHEN 5-325 MG PO TABS: 1.0000 | ORAL_TABLET | Freq: Four times a day (QID) | ORAL | 0 refills | Status: DC | PRN

## 2021-05-16 NOTE — Telephone Encounter (Signed)
Patient called back stating she will give up the muscle relaxer and just take the vicodin. She is requesting this at [facility] on [address].

## 2021-05-17 ENCOUNTER — Ambulatory Visit: Payer: Medicare Other | Admitting: Podiatry

## 2021-05-22 ENCOUNTER — Ambulatory Visit (INDEPENDENT_AMBULATORY_CARE_PROVIDER_SITE_OTHER): Payer: Medicare Other | Admitting: Podiatry

## 2021-05-22 ENCOUNTER — Encounter: Payer: Self-pay | Admitting: Podiatry

## 2021-05-22 ENCOUNTER — Other Ambulatory Visit: Payer: Self-pay

## 2021-05-22 DIAGNOSIS — I872 Venous insufficiency (chronic) (peripheral): Secondary | ICD-10-CM | POA: Diagnosis not present

## 2021-05-22 DIAGNOSIS — M79674 Pain in right toe(s): Secondary | ICD-10-CM

## 2021-05-22 DIAGNOSIS — B351 Tinea unguium: Secondary | ICD-10-CM

## 2021-05-22 DIAGNOSIS — N183 Chronic kidney disease, stage 3 unspecified: Secondary | ICD-10-CM | POA: Diagnosis not present

## 2021-05-22 DIAGNOSIS — G808 Other cerebral palsy: Secondary | ICD-10-CM | POA: Diagnosis not present

## 2021-05-22 DIAGNOSIS — M79675 Pain in left toe(s): Secondary | ICD-10-CM

## 2021-05-22 NOTE — Progress Notes (Signed)
This patient returns to the office for evaluation and treatment of long thick painful nails .  This patient is unable to trim her own nails since the patient cannot reach her feet.  Patient says the nails are painful walking and wearing his shoes. She returns for preventive foot care services. Patient has venous insufficiency and kidney disease.  General Appearance  Alert, conversant and in no acute stress.  Vascular  Dorsalis pedis and posterior tibial  pulses are palpable  bilaterally.  Capillary return is within normal limits  bilaterally. Temperature is within normal limits  bilaterally.  Neurologic  Senn-Weinstein monofilament wire test within normal limits  bilaterally. Muscle power within normal limits bilaterally.  Nails Thick disfigured discolored nails with subungual debris  from hallux to fifth toes bilaterally. No evidence of bacterial infection or drainage bilaterally.  Orthopedic  No limitations of motion  feet .  No crepitus or effusions noted.  No bony pathology or digital deformities noted. Severe HAV  B/L.  Hammer toes  B/L.   Flattened foot profile right foot with prominent navicular.  Skin  normotropic skin with no porokeratosis noted bilaterally.  No signs of infections or ulcers noted.   Callus sub 1st  MCJ right foot asymptomatic.  Onychomycosis  Pain in toes right foot  Pain in toes left foot  Debridement  of nails  1-5  B/L with a nail nipper.  Nails were then filed using a dremel tool with no incidents.    RTC   3 months   Gardiner Barefoot DPM

## 2021-05-31 ENCOUNTER — Other Ambulatory Visit: Payer: Self-pay

## 2021-05-31 DIAGNOSIS — I10 Essential (primary) hypertension: Secondary | ICD-10-CM

## 2021-05-31 MED ORDER — TRIAMTERENE-HCTZ 37.5-25 MG PO TABS: 1.0000 | ORAL_TABLET | Freq: Every day | ORAL | 3 refills | Status: DC

## 2021-05-31 NOTE — Telephone Encounter (Signed)
triamterene-hydrochlorothiazide (MAXZIDE-25) 37.5-25 MG tablet, REFILL REQUEST @ [id number] - [place], [place] - [address].

## 2021-06-04 ENCOUNTER — Other Ambulatory Visit: Payer: Self-pay | Admitting: Internal Medicine

## 2021-06-04 DIAGNOSIS — G894 Chronic pain syndrome: Secondary | ICD-10-CM

## 2021-06-04 NOTE — Telephone Encounter (Signed)
Refill Request   HYDROcodone-acetaminophen (NORCO/VICODIN) 5-325 MG tablet  [facility] [id number] - [place], [place] - [address] (Ph: [phone])

## 2021-06-04 NOTE — Telephone Encounter (Signed)
Last rx written 05/16/21. Last OV  01/24/21. Next OV  07/04/21. UDS  06/26/16.

## 2021-06-10 MED ORDER — HYDROCODONE-ACETAMINOPHEN 5-325 MG PO TABS: 1.0000 | ORAL_TABLET | Freq: Four times a day (QID) | ORAL | 0 refills | Status: DC | PRN

## 2021-07-04 ENCOUNTER — Ambulatory Visit (INDEPENDENT_AMBULATORY_CARE_PROVIDER_SITE_OTHER): Payer: Medicare Other | Admitting: Internal Medicine

## 2021-07-04 VITALS — BP 115/71 | HR 58 | Temp 98.0°F | Ht 59.0 in | Wt 138.3 lb

## 2021-07-04 DIAGNOSIS — G809 Cerebral palsy, unspecified: Secondary | ICD-10-CM

## 2021-07-04 DIAGNOSIS — E559 Vitamin D deficiency, unspecified: Secondary | ICD-10-CM

## 2021-07-04 DIAGNOSIS — Z23 Encounter for immunization: Secondary | ICD-10-CM

## 2021-07-04 DIAGNOSIS — I10 Essential (primary) hypertension: Secondary | ICD-10-CM

## 2021-07-04 DIAGNOSIS — M810 Age-related osteoporosis without current pathological fracture: Secondary | ICD-10-CM

## 2021-07-04 DIAGNOSIS — K828 Other specified diseases of gallbladder: Secondary | ICD-10-CM

## 2021-07-04 DIAGNOSIS — S99921S Unspecified injury of right foot, sequela: Secondary | ICD-10-CM | POA: Diagnosis not present

## 2021-07-04 DIAGNOSIS — G894 Chronic pain syndrome: Secondary | ICD-10-CM | POA: Diagnosis not present

## 2021-07-04 DIAGNOSIS — Z0000 Encounter for general adult medical examination without abnormal findings: Secondary | ICD-10-CM

## 2021-07-04 DIAGNOSIS — N183 Chronic kidney disease, stage 3 unspecified: Secondary | ICD-10-CM

## 2021-07-04 DIAGNOSIS — R42 Dizziness and giddiness: Secondary | ICD-10-CM | POA: Diagnosis not present

## 2021-07-04 DIAGNOSIS — L282 Other prurigo: Secondary | ICD-10-CM | POA: Diagnosis not present

## 2021-07-04 NOTE — Progress Notes (Signed)
"  I'm doing terrible, doctor" (laughs) - c/o a recent itchy rash on her arms and sore R foot.  Since last visit she has had no acute events, though sustained minor injury to dorsal R foot when trying to take out the garbage (inadvisable, though she is very independent).  Several weeks ago, improving, but wanted to bring it to attention.  The skin changes of arms began just recently.  On primarily forearms she has developed itchy spots - not sure if they are bumps or blisters (tiny, regardless), but she scratches them.  No new soap or products.  No pets.  No exposures to anything out of the ordinary in her environment.    The muscle spasm pain so problematic at last visit is much improved, though she continues to experience pain "all over".  She feels her joints becoming stiffer and inquires about any available solutions.    Wants to discuss the LLE compression stocking, which is becoming more difficult to don/doff (her sister who doesn't live in the home dose it for her, the frequency of changing is questionable).  She has tried unsuccessfully using don/doff frames/devices.   Hx of limb threatening cellulitis LLE about 30 yrs ago, told then to wear compression stockings the rest of her life to prevent cellulitis.  She has tried going without it but leg swells again, making it difficult to replace the stocking.    Desires flu shot  BP 115/71 (BP Location: Right Arm, Patient Position: Sitting, Cuff Size: Small)   Pulse (!) 58   Temp 98 F (36.7 C) (Oral)   Ht '4\' 11"'$  (1.499 m)   Wt 138 lb 4.8 oz (62.7 kg)   SpO2 100%   BMI 27.93 kg/m   Cheerful, sitting in transport chair with legs extended, valgus deformities of knees.  Deconditioned and mildly overweight habitus.  R dorsal midfoot with 2cm diameter area of mild swelling, bruise, and tenderness.  Pes planus deformity with navicular prominence. No foot lesions, nails recently trimmed.  Calluses lateral 5th mtps.  Feet are soiled.  Legs with  lymphedema which begins just above ankles.  Dirty LLE compression stocking removed to reveal health skin, small vertical scar over ankle dorsiflexion tendon with blanching redness (old surgery site).   Scattered excoriated lesions on forearms and L upper arm.  No fresh lesions identified.    Heart rrr at time of auscultation (HR 58 with vitals), no murmur. Lungs clear.  Assessment and plan: See problem based documentation.  Doing as well as can be expected.  I'm concerned about her safety and personal care at home alone, though she remains resistance to assistance aside from her sister who visits when needed.  A PT reevaluation for potential improvement in ROM will be requested.     Bmp and vit D Flu vax

## 2021-07-04 NOTE — Patient Instructions (Addendum)
Ms. Martinique,  Great to see you today! We discovered that we both love [place] bars!  You got your flu shot today, thank you, and you want to postpone the pneumonia vaccine until a future visit. I recommend an over the counter hydrocortisone cream for the rash on your arms.   We will have physical therapy work with you at home on your stiffness. Today we are checking your vitamin D levels (it has been very low and you will need to start a prescription supplement which I'll call you about), and also your kidney function to keep an eye on your kidney health.  Your blood pressure is perfect!  Take care and see you next time!  Dr. Jimmye Norman

## 2021-07-05 ENCOUNTER — Encounter: Payer: Self-pay | Admitting: Internal Medicine

## 2021-07-05 ENCOUNTER — Telehealth: Payer: Self-pay

## 2021-07-05 DIAGNOSIS — S99921A Unspecified injury of right foot, initial encounter: Secondary | ICD-10-CM | POA: Insufficient documentation

## 2021-07-05 DIAGNOSIS — L282 Other prurigo: Secondary | ICD-10-CM | POA: Insufficient documentation

## 2021-07-05 LAB — BMP8+ANION GAP
Anion Gap: 17 mmol/L (ref 10.0–18.0)
BUN/Creatinine Ratio: 27 (ref 12–28)
BUN: 29 mg/dL — ABNORMAL HIGH (ref 8–27)
CO2: 23 mmol/L (ref 20–29)
Calcium: 9.4 mg/dL (ref 8.7–10.3)
Chloride: 103 mmol/L (ref 96–106)
Creatinine, Ser: 1.06 mg/dL — ABNORMAL HIGH (ref 0.57–1.00)
Glucose: 99 mg/dL (ref 65–99)
Potassium: 3.6 mmol/L (ref 3.5–5.2)
Sodium: 143 mmol/L (ref 134–144)
eGFR: 58 mL/min/{1.73_m2} — ABNORMAL LOW (ref >59)

## 2021-07-05 LAB — VITAMIN D 25 HYDROXY (VIT D DEFICIENCY, FRACTURES): Vit D, 25-Hydroxy: 24.3 ng/mL — ABNORMAL LOW (ref 30.0–100.0)

## 2021-07-05 MED ORDER — CHOLECALCIFEROL 50 MCG (2000 UT) PO CAPS: 2000.0000 [IU] | ORAL_CAPSULE | Freq: Every day | ORAL | 3 refills | Status: AC

## 2021-07-05 MED ORDER — HYDROCORTISONE 1 % EX CREA: TOPICAL_CREAM | CUTANEOUS | 1 refills | Status: AC

## 2021-07-05 NOTE — Telephone Encounter (Signed)
Requesting to speak with a nurse about Vitamin D. Please call pt back.

## 2021-07-05 NOTE — Assessment & Plan Note (Signed)
Well controlled today.  Check bmp for renal fxn and electrolytes.

## 2021-07-05 NOTE — Assessment & Plan Note (Signed)
Remaining fiercely independent, though her hygiene (feet) was suboptimal and her socks and shoes were quite soiled.  She would benefit from personal care assistance but has remained resistant.  She is becoming stiffer and would like to work with PT to determine of ROM can be improved.  Will place order for HHPT.

## 2021-07-05 NOTE — Assessment & Plan Note (Signed)
Flu vax today

## 2021-07-05 NOTE — Telephone Encounter (Signed)
Return pt's call - stated Dr Jimmye Norman was going to write rx for Vitamin D. Informed pt her doctor probably has not viewed her lab results yet.  And we will let her know of result.

## 2021-07-05 NOTE — Assessment & Plan Note (Signed)
At last check eGFR had improved to stage 2, recheck today to determine trajectory. She is on a diuretic antihypertensive.

## 2021-07-05 NOTE — Assessment & Plan Note (Signed)
Has not been symptomatic. Will change to hx of vertigo and place in [place].

## 2021-07-05 NOTE — Assessment & Plan Note (Signed)
Voices adherence to bisphosphonate.  Recheck vit D today.

## 2021-07-05 NOTE — Assessment & Plan Note (Signed)
Remains asymptomatic.

## 2021-07-05 NOTE — Assessment & Plan Note (Signed)
Pain is unchanged.  No inappropriate use of opioid prescription.  We discussed CP chronic pain and she is interested in whether increased ROM via PT would be helpful.

## 2021-07-09 ENCOUNTER — Telehealth: Payer: Self-pay | Admitting: Internal Medicine

## 2021-07-09 NOTE — Telephone Encounter (Signed)
Pt requesting a call back about her medication.

## 2021-07-09 NOTE — Telephone Encounter (Signed)
Return pt's call. Requesting lab results. Stated she wants to how her kidneys are doing also vit d level. Anxious to find out level of kidney disease. Thanks

## 2021-07-10 ENCOUNTER — Telehealth: Payer: Self-pay | Admitting: Internal Medicine

## 2021-07-10 ENCOUNTER — Other Ambulatory Visit: Payer: Self-pay

## 2021-07-10 DIAGNOSIS — G894 Chronic pain syndrome: Secondary | ICD-10-CM

## 2021-07-10 MED ORDER — HYDROCODONE-ACETAMINOPHEN 5-325 MG PO TABS: 1.0000 | ORAL_TABLET | Freq: Four times a day (QID) | ORAL | 0 refills | Status: DC | PRN

## 2021-07-10 NOTE — Telephone Encounter (Signed)
S poke to Kathy Owens to inform her of her lab results and to encourage her to start taking vit D 25-50 mcgs at least several times a week.  She agrees.

## 2021-07-18 ENCOUNTER — Telehealth: Payer: Self-pay | Admitting: *Deleted

## 2021-07-18 NOTE — Telephone Encounter (Signed)
Patient PCP is aware that at this time she does not want PT. Advance is aware. If Patient said if she change her mind she will call us [place], [place] C9/29/20223:17 PM

## 2021-07-18 NOTE — Telephone Encounter (Signed)
[place], [place], NT  [place], [place] Cc: [place], [place], [place]; [place], [place], RN  I spoke to patient by phone and she does not want PT at this time. I am sending a message to her doctor.I will get back with you after I hear from the doctor today or tomorrow [place], [place] C9/28/20221:26 PM

## 2021-08-05 ENCOUNTER — Other Ambulatory Visit: Payer: Self-pay | Admitting: Internal Medicine

## 2021-08-05 DIAGNOSIS — G894 Chronic pain syndrome: Secondary | ICD-10-CM

## 2021-08-05 MED ORDER — HYDROCODONE-ACETAMINOPHEN 5-325 MG PO TABS: 1.0000 | ORAL_TABLET | Freq: Four times a day (QID) | ORAL | 0 refills | Status: DC | PRN

## 2021-08-05 NOTE — Telephone Encounter (Signed)
Refill Request   HYDROcodone-acetaminophen (NORCO/VICODIN) 5-325 MG tablet    [id number] - [place], [place] [address] AT [address] (Ph: [phone])  Order Details

## 2021-08-26 ENCOUNTER — Ambulatory Visit: Payer: Medicare Other | Admitting: Podiatry

## 2021-08-26 ENCOUNTER — Other Ambulatory Visit: Payer: Self-pay

## 2021-08-26 DIAGNOSIS — E785 Hyperlipidemia, unspecified: Secondary | ICD-10-CM

## 2021-08-26 MED ORDER — ATORVASTATIN CALCIUM 10 MG PO TABS: ORAL_TABLET | ORAL | 3 refills | Status: DC

## 2021-08-26 NOTE — Telephone Encounter (Signed)
atorvastatin (LIPITOR) 10 MG tablet, REFILL REQUEST @ [facility] #[id number] - [place], [place] - [address].

## 2021-09-04 ENCOUNTER — Other Ambulatory Visit: Payer: Self-pay | Admitting: Internal Medicine

## 2021-09-04 DIAGNOSIS — G894 Chronic pain syndrome: Secondary | ICD-10-CM

## 2021-09-04 NOTE — Telephone Encounter (Signed)
Refill Request   HYDROcodone-acetaminophen (NORCO/VICODIN) 5-325 MG tablet   [facility] #[id number] - [place], [place] [address] (Ph: [phone])

## 2021-09-05 MED ORDER — HYDROCODONE-ACETAMINOPHEN 5-325 MG PO TABS: 1.0000 | ORAL_TABLET | Freq: Four times a day (QID) | ORAL | 0 refills | Status: DC | PRN

## 2021-09-16 ENCOUNTER — Ambulatory Visit (INDEPENDENT_AMBULATORY_CARE_PROVIDER_SITE_OTHER): Payer: Medicare Other | Admitting: Podiatry

## 2021-09-16 ENCOUNTER — Other Ambulatory Visit: Payer: Self-pay

## 2021-09-16 ENCOUNTER — Encounter: Payer: Self-pay | Admitting: Podiatry

## 2021-09-16 DIAGNOSIS — I872 Venous insufficiency (chronic) (peripheral): Secondary | ICD-10-CM

## 2021-09-16 DIAGNOSIS — M79674 Pain in right toe(s): Secondary | ICD-10-CM

## 2021-09-16 DIAGNOSIS — B351 Tinea unguium: Secondary | ICD-10-CM | POA: Diagnosis not present

## 2021-09-16 DIAGNOSIS — M79675 Pain in left toe(s): Secondary | ICD-10-CM

## 2021-09-16 DIAGNOSIS — N183 Chronic kidney disease, stage 3 unspecified: Secondary | ICD-10-CM

## 2021-09-16 DIAGNOSIS — G808 Other cerebral palsy: Secondary | ICD-10-CM

## 2021-09-16 NOTE — Progress Notes (Signed)
This patient returns to the office for evaluation and treatment of long thick painful nails .  This patient is unable to trim her own nails since the patient cannot reach her feet.  Patient says the nails are painful walking and wearing his shoes. She returns for preventive foot care services. Patient has venous insufficiency and kidney disease.  General Appearance  Alert, conversant and in no acute stress.  Vascular  Dorsalis pedis and posterior tibial  pulses are palpable  bilaterally.  Capillary return is within normal limits  bilaterally. Temperature is within normal limits  bilaterally.  Neurologic  Senn-Weinstein monofilament wire test within normal limits  bilaterally. Muscle power within normal limits bilaterally.  Nails Thick disfigured discolored nails with subungual debris  from hallux to fifth toes bilaterally. No evidence of bacterial infection or drainage bilaterally.  Orthopedic  No limitations of motion  feet .  No crepitus or effusions noted.  No bony pathology or digital deformities noted. Severe HAV  B/L.  Hammer toes  B/L.   Flattened foot profile right foot with prominent navicular.  Skin  normotropic skin with no porokeratosis noted bilaterally.  No signs of infections or ulcers noted.   Callus sub 1st  MCJ right foot asymptomatic.  Onychomycosis  Pain in toes right foot  Pain in toes left foot  Debridement  of nails  1-5  B/L with a nail nipper.  Nails were then filed using a dremel tool with no incidents.    RTC   3 months   Gardiner Barefoot DPM

## 2021-10-04 ENCOUNTER — Other Ambulatory Visit: Payer: Self-pay

## 2021-10-04 DIAGNOSIS — G894 Chronic pain syndrome: Secondary | ICD-10-CM

## 2021-10-04 MED ORDER — HYDROCODONE-ACETAMINOPHEN 5-325 MG PO TABS: 1.0000 | ORAL_TABLET | Freq: Four times a day (QID) | ORAL | 0 refills | Status: DC | PRN

## 2021-10-04 NOTE — Telephone Encounter (Signed)
HYDROcodone-acetaminophen (NORCO/VICODIN) 5-325 MG tablet, refill request @ [place], [address] AT [place].

## 2021-10-18 ENCOUNTER — Emergency Department (HOSPITAL_COMMUNITY): Payer: Medicare Other

## 2021-10-18 ENCOUNTER — Emergency Department (HOSPITAL_COMMUNITY)
Admission: EM | Admit: 2021-10-18 | Discharge: 2021-10-19 | Disposition: A | Discharge status: Home / Self Care | Payer: Medicare Other | Attending: Emergency Medicine | Admitting: Emergency Medicine

## 2021-10-18 ENCOUNTER — Other Ambulatory Visit: Payer: Self-pay

## 2021-10-18 DIAGNOSIS — R404 Transient alteration of awareness: Secondary | ICD-10-CM | POA: Diagnosis not present

## 2021-10-18 DIAGNOSIS — R609 Edema, unspecified: Secondary | ICD-10-CM | POA: Diagnosis not present

## 2021-10-18 DIAGNOSIS — S8012XA Contusion of left lower leg, initial encounter: Secondary | ICD-10-CM | POA: Diagnosis not present

## 2021-10-18 DIAGNOSIS — Z7982 Long term (current) use of aspirin: Secondary | ICD-10-CM | POA: Diagnosis not present

## 2021-10-18 DIAGNOSIS — Z743 Need for continuous supervision: Secondary | ICD-10-CM | POA: Diagnosis not present

## 2021-10-18 DIAGNOSIS — I10 Essential (primary) hypertension: Secondary | ICD-10-CM | POA: Diagnosis not present

## 2021-10-18 DIAGNOSIS — Y92009 Unspecified place in unspecified non-institutional (private) residence as the place of occurrence of the external cause: Secondary | ICD-10-CM | POA: Insufficient documentation

## 2021-10-18 DIAGNOSIS — S8992XA Unspecified injury of left lower leg, initial encounter: Secondary | ICD-10-CM | POA: Diagnosis present

## 2021-10-18 DIAGNOSIS — Z96641 Presence of right artificial hip joint: Secondary | ICD-10-CM | POA: Insufficient documentation

## 2021-10-18 DIAGNOSIS — M7989 Other specified soft tissue disorders: Secondary | ICD-10-CM | POA: Diagnosis not present
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
## 2021-10-18 NOTE — ED Triage Notes (Signed)
In triage noted deformity to left lower leg. Denies blood thinner. Was A&Ox4.
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
## 2021-10-19 NOTE — Discharge Instructions (Signed)
Keep the leg elevated and wear compression stockings to help reduce the swelling.  You can take over-the-counter pain medications.  Please follow-up with your regular doctor in the next 3 to 5 days and return the emergency department for any new or worsening symptoms in the meantime.
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
## 2021-10-19 NOTE — ED Notes (Signed)
Discharge instructions discussed with pt. Pt verbalize understanding with no questions at this time. PTAR called for transprot home. Pt impaired gait at baseline now worsen d/t injury. Pt verbalized understanding of waiting in lobby for transport home.

## 2021-10-19 NOTE — ED Notes (Signed)
Pt was able to locate family for pick up. She is transport to lobby pending ride home.

## 2021-10-19 NOTE — ED Notes (Signed)
Charge RN [employer] informed via secure chat pt waiting for PTAR in lobby

## 2021-11-01 ENCOUNTER — Other Ambulatory Visit: Payer: Self-pay

## 2021-11-01 DIAGNOSIS — G894 Chronic pain syndrome: Secondary | ICD-10-CM

## 2021-11-01 MED ORDER — HYDROCODONE-ACETAMINOPHEN 5-325 MG PO TABS: 1.0000 | ORAL_TABLET | Freq: Four times a day (QID) | ORAL | 0 refills | Status: DC | PRN

## 2021-11-01 NOTE — Telephone Encounter (Signed)
HYDROcodone-acetaminophen (NORCO/VICODIN) 5-325 MG tablet, refill request @ [place], [address] AT [place].

## 2021-11-05 ENCOUNTER — Other Ambulatory Visit: Payer: Self-pay

## 2021-11-05 DIAGNOSIS — K529 Noninfective gastroenteritis and colitis, unspecified: Secondary | ICD-10-CM

## 2021-11-05 MED ORDER — LOPERAMIDE HCL 2 MG PO CAPS: 2.0000 mg | ORAL_CAPSULE | Freq: Every day | ORAL | 3 refills | Status: DC | PRN

## 2021-11-26 ENCOUNTER — Other Ambulatory Visit: Payer: Self-pay

## 2021-11-26 DIAGNOSIS — G894 Chronic pain syndrome: Secondary | ICD-10-CM

## 2021-11-28 MED ORDER — HYDROCODONE-ACETAMINOPHEN 5-325 MG PO TABS: 1.0000 | ORAL_TABLET | Freq: Four times a day (QID) | ORAL | 0 refills | Status: DC | PRN

## 2021-12-02 ENCOUNTER — Telehealth: Payer: Self-pay

## 2021-12-02 ENCOUNTER — Other Ambulatory Visit: Payer: Self-pay

## 2021-12-02 ENCOUNTER — Other Ambulatory Visit: Payer: Self-pay | Admitting: Internal Medicine

## 2021-12-02 DIAGNOSIS — G800 Spastic quadriplegic cerebral palsy: Secondary | ICD-10-CM

## 2021-12-02 NOTE — Telephone Encounter (Signed)
RE: DME for beside commode chair Received: Today Darrelyn Hillock, [place]; [place], Royetta Crochet; 2 others Got it        Previous Messages   ----- Message -----  From: Judge Stall, [place]  Sent: 12/02/2021   3:49 PM EST  To: Darlina Guys, Chapman Fitch, *  Subject: DME for beside commode chair                   Good afternoon an order has been placed for this patient for a bedside commode chair [place], [place] C2/13/20233:49 PM

## 2021-12-02 NOTE — Telephone Encounter (Signed)
Patient called in this morning requesting a Bedside Commode chair. Patient said she had one before but it was a long time ago.I will ask her PCP if she would put and order in for one.

## 2021-12-03 DIAGNOSIS — G809 Cerebral palsy, unspecified: Secondary | ICD-10-CM | POA: Diagnosis not present

## 2021-12-03 DIAGNOSIS — L03116 Cellulitis of left lower limb: Secondary | ICD-10-CM | POA: Diagnosis not present

## 2021-12-03 DIAGNOSIS — R2681 Unsteadiness on feet: Secondary | ICD-10-CM | POA: Diagnosis not present

## 2021-12-17 ENCOUNTER — Ambulatory Visit: Payer: Medicare Other | Admitting: Podiatry

## 2021-12-24 ENCOUNTER — Other Ambulatory Visit: Payer: Self-pay

## 2021-12-24 DIAGNOSIS — G894 Chronic pain syndrome: Secondary | ICD-10-CM

## 2021-12-24 MED ORDER — HYDROCODONE-ACETAMINOPHEN 5-325 MG PO TABS: 1.0000 | ORAL_TABLET | Freq: Four times a day (QID) | ORAL | 0 refills | Status: DC | PRN

## 2021-12-25 ENCOUNTER — Ambulatory Visit: Payer: Medicare Other | Admitting: Podiatry

## 2022-01-02 ENCOUNTER — Encounter: Payer: Medicare Other | Admitting: Internal Medicine

## 2022-01-07 ENCOUNTER — Other Ambulatory Visit: Payer: Self-pay

## 2022-01-07 ENCOUNTER — Encounter: Payer: Self-pay | Admitting: Podiatry

## 2022-01-07 ENCOUNTER — Ambulatory Visit (INDEPENDENT_AMBULATORY_CARE_PROVIDER_SITE_OTHER): Payer: Medicare Other | Admitting: Podiatry

## 2022-01-07 DIAGNOSIS — B351 Tinea unguium: Secondary | ICD-10-CM | POA: Diagnosis not present

## 2022-01-07 DIAGNOSIS — N183 Chronic kidney disease, stage 3 unspecified: Secondary | ICD-10-CM | POA: Diagnosis not present

## 2022-01-07 DIAGNOSIS — G808 Other cerebral palsy: Secondary | ICD-10-CM

## 2022-01-07 DIAGNOSIS — I872 Venous insufficiency (chronic) (peripheral): Secondary | ICD-10-CM

## 2022-01-07 DIAGNOSIS — M79675 Pain in left toe(s): Secondary | ICD-10-CM | POA: Diagnosis not present

## 2022-01-07 DIAGNOSIS — N2889 Other specified disorders of kidney and ureter: Secondary | ICD-10-CM

## 2022-01-07 DIAGNOSIS — M79674 Pain in right toe(s): Secondary | ICD-10-CM | POA: Diagnosis not present

## 2022-01-07 NOTE — Progress Notes (Signed)
This patient returns to the office for evaluation and treatment of long thick painful nails .  This patient is unable to trim her own nails since the patient cannot reach her feet.  Patient says the nails are painful walking and wearing his shoes. She returns for preventive foot care services. Patient has venous insufficiency and kidney disease. ? ?General Appearance  Alert, conversant and in no acute stress. ? ?Vascular  Dorsalis pedis and posterior tibial  pulses are palpable  bilaterally.  Capillary return is within normal limits  bilaterally. Temperature is within normal limits  bilaterally. ? ?Neurologic  Senn-Weinstein monofilament wire test within normal limits  bilaterally. Muscle power within normal limits bilaterally. ? ?Nails Thick disfigured discolored nails with subungual debris  from hallux to fifth toes bilaterally. No evidence of bacterial infection or drainage bilaterally. ? ?Orthopedic  No limitations of motion  feet .  No crepitus or effusions noted.  No bony pathology or digital deformities noted. Severe HAV  B/L.  Hammer toes  B/L.   Flattened foot profile right foot with prominent navicular. ? ?Skin  normotropic skin with no porokeratosis noted bilaterally.  No signs of infections or ulcers noted.   Callus sub 1st  MCJ right foot asymptomatic. ? ?Onychomycosis  Pain in toes right foot  Pain in toes left foot ? ?Debridement  of nails  1-5  B/L with a nail nipper.  Nails were then filed using a dremel tool with no incidents.    RTC   3 months ? ? ?Gardiner Barefoot DPM  ?

## 2022-01-16 ENCOUNTER — Ambulatory Visit (INDEPENDENT_AMBULATORY_CARE_PROVIDER_SITE_OTHER): Payer: Medicare Other | Admitting: Internal Medicine

## 2022-01-16 DIAGNOSIS — I10 Essential (primary) hypertension: Secondary | ICD-10-CM | POA: Diagnosis not present

## 2022-01-16 DIAGNOSIS — M810 Age-related osteoporosis without current pathological fracture: Secondary | ICD-10-CM | POA: Diagnosis not present

## 2022-01-16 DIAGNOSIS — R42 Dizziness and giddiness: Secondary | ICD-10-CM

## 2022-01-16 DIAGNOSIS — G894 Chronic pain syndrome: Secondary | ICD-10-CM

## 2022-01-16 NOTE — Patient Instructions (Addendum)
Kathy Owens, ? ?It was wonderful to see you!  I enjoy our talks about Kathy Owens bars!   ? ?Your blood pressure looks perfect.  We talked about how we will continue the blood pressure medicine. ? ?No blood work today.  Let's get together in 6 months for that.  ? ?Your leg is going to be fine. It is a hematoma. ? ?Please call us if we can help before your next appointment! ? ?Happy [name] to you! ? ?Dr. Jimmye Norman ? ? ?
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
## 2022-01-20 ENCOUNTER — Other Ambulatory Visit: Payer: Self-pay

## 2022-01-20 DIAGNOSIS — G894 Chronic pain syndrome: Secondary | ICD-10-CM

## 2022-01-20 NOTE — Telephone Encounter (Signed)
Call to patient about early request.  Stated that she is not completely out.  Just wanted to make sure that she does not run out.   ?

## 2022-01-20 NOTE — Telephone Encounter (Signed)
HYDROcodone-acetaminophen (NORCO/VICODIN) 5-325 MG tablet, REFILL REQUEST @ [id number] - [place], [place] - [address] 

## 2022-01-21 MED ORDER — HYDROCODONE-ACETAMINOPHEN 5-325 MG PO TABS: 1.0000 | ORAL_TABLET | Freq: Four times a day (QID) | ORAL | 0 refills | Status: DC | PRN

## 2022-02-03 NOTE — Assessment & Plan Note (Signed)
Recently experienced an episode of spinning vertigo, consistent with prior episodes,  relieved by taking diphenhydramine 50 mg (which she had been told in the past to do).  Fortunately, she does not experience anticholinergic side effects or significant drowsiness as a side effect.  She is currently asymptomatic. ?

## 2022-02-03 NOTE — Assessment & Plan Note (Signed)
Pain is well controlled on current regimen which is taking as prescribed with no inappropriate use or early refill requests.  Monitor. ?

## 2022-02-03 NOTE — Assessment & Plan Note (Signed)
Well-controlled, though she did not take her medication this morning.  I suggested that she stop her medicine she does not feel safe doing this.  We agreed that she would monitor for positional dizziness at which time we will need to recheck and readdress. ?

## 2022-02-03 NOTE — Assessment & Plan Note (Signed)
She no longer wishes to take Fosamax and has stopped due to inconvenience.  I will continue to periodically check vitamin D.  24.3 06/2021.  She is certainly at high risk for falls, but so far has been doing well. ?

## 2022-02-07 ENCOUNTER — Telehealth: Payer: Self-pay

## 2022-02-07 NOTE — Telephone Encounter (Signed)
RTC from patient.  Undecided abut stopping her blood pressure medicine as discussed with Dr. Jimmye Norman.  Patient has decided to go ahead a try stopping her blood pressure medicine for now.  Will pay attention to any change in how she is feeling and note if she has any increased dizziness.  Unable to take blood pressures at home.   Patient was advised to call the Clinics if she has any problems related to coming off blood pressure  medication. ?

## 2022-02-07 NOTE — Telephone Encounter (Signed)
Requesting to speak with a nurse about taking bp medication. Please call pt back.  ?

## 2022-02-07 NOTE — Telephone Encounter (Signed)
RTC to patient.  Message was left that the Clinics had returned her call. ?

## 2022-02-24 ENCOUNTER — Telehealth: Payer: Self-pay | Admitting: Internal Medicine

## 2022-02-24 ENCOUNTER — Other Ambulatory Visit: Payer: Self-pay | Admitting: Internal Medicine

## 2022-02-24 DIAGNOSIS — G894 Chronic pain syndrome: Secondary | ICD-10-CM

## 2022-02-24 MED ORDER — HYDROCODONE-ACETAMINOPHEN 5-325 MG PO TABS: 1.0000 | ORAL_TABLET | Freq: Four times a day (QID) | ORAL | 0 refills | Status: DC | PRN

## 2022-02-24 NOTE — Telephone Encounter (Signed)
Refill Request  HYDROcodone-acetaminophen (NORCO/VICODIN) 5-325 MG tablet   [id number] - [place], [place] - [address]   

## 2022-02-24 NOTE — Telephone Encounter (Signed)
Last [facility] was 06/26/2016. Last Appt was 01/16/2022.  No pending appointments ?

## 2022-03-31 ENCOUNTER — Other Ambulatory Visit: Payer: Self-pay

## 2022-03-31 DIAGNOSIS — I10 Essential (primary) hypertension: Secondary | ICD-10-CM

## 2022-03-31 MED ORDER — TRIAMTERENE-HCTZ 37.5-25 MG PO TABS: 1.0000 | ORAL_TABLET | Freq: Every day | ORAL | 3 refills | Status: DC

## 2022-03-31 NOTE — Telephone Encounter (Signed)
triamterene-hydrochlorothiazide 90 day supply   [id number] - [place], [place] Phone:  [phone]  Fax:  [phone]

## 2022-04-09 ENCOUNTER — Ambulatory Visit: Payer: Medicare Other | Admitting: Podiatry

## 2022-04-28 ENCOUNTER — Other Ambulatory Visit: Payer: Self-pay

## 2022-04-28 DIAGNOSIS — G894 Chronic pain syndrome: Secondary | ICD-10-CM

## 2022-04-28 NOTE — Telephone Encounter (Signed)
HYDROcodone-acetaminophen (NORCO/VICODIN) 5-325 MG tablet, REFILL REQUEST @ [id number] - [place], [place] - [address] [place].

## 2022-04-29 MED ORDER — HYDROCODONE-ACETAMINOPHEN 5-325 MG PO TABS: 1.0000 | ORAL_TABLET | Freq: Four times a day (QID) | ORAL | 0 refills | Status: DC | PRN

## 2022-05-12 ENCOUNTER — Ambulatory Visit: Payer: Medicare Other | Admitting: Podiatry

## 2022-05-13 ENCOUNTER — Ambulatory Visit: Payer: Medicare Other | Admitting: Podiatry

## 2022-05-26 ENCOUNTER — Other Ambulatory Visit: Payer: Self-pay | Admitting: Internal Medicine

## 2022-05-26 DIAGNOSIS — G894 Chronic pain syndrome: Secondary | ICD-10-CM

## 2022-05-26 MED ORDER — HYDROCODONE-ACETAMINOPHEN 5-325 MG PO TABS: 1.0000 | ORAL_TABLET | Freq: Four times a day (QID) | ORAL | 0 refills | Status: DC | PRN

## 2022-05-26 NOTE — Telephone Encounter (Signed)
Refill Request  HYDROcodone-acetaminophen (NORCO/VICODIN) 5-325 MG tablet   [id number] - [place], [place] - [address]   

## 2022-06-19 ENCOUNTER — Other Ambulatory Visit: Payer: Self-pay

## 2022-06-19 DIAGNOSIS — E785 Hyperlipidemia, unspecified: Secondary | ICD-10-CM

## 2022-06-19 MED ORDER — ATORVASTATIN CALCIUM 10 MG PO TABS
ORAL_TABLET | ORAL | 3 refills | Status: DC
Start: 2022-06-19 — End: 2022-08-14

## 2022-06-24 ENCOUNTER — Other Ambulatory Visit: Payer: Self-pay

## 2022-06-24 DIAGNOSIS — G894 Chronic pain syndrome: Secondary | ICD-10-CM

## 2022-06-24 MED ORDER — HYDROCODONE-ACETAMINOPHEN 5-325 MG PO TABS: 1.0000 | ORAL_TABLET | Freq: Four times a day (QID) | ORAL | 0 refills | Status: DC | PRN

## 2022-06-24 NOTE — Telephone Encounter (Signed)
HYDROcodone-acetaminophen (NORCO/VICODIN) 5-325 MG tablet, REFILL REQUEST @ [id number] - [place], [place] - [address].

## 2022-07-21 ENCOUNTER — Ambulatory Visit: Payer: Medicare Other | Admitting: Podiatry

## 2022-07-21 ENCOUNTER — Other Ambulatory Visit: Payer: Self-pay | Admitting: Internal Medicine

## 2022-07-21 DIAGNOSIS — G894 Chronic pain syndrome: Secondary | ICD-10-CM

## 2022-07-21 MED ORDER — HYDROCODONE-ACETAMINOPHEN 5-325 MG PO TABS
1.0000 | ORAL_TABLET | Freq: Four times a day (QID) | ORAL | 0 refills | Status: DC | PRN
Start: 2022-07-21 — End: 2022-08-14

## 2022-07-21 NOTE — Telephone Encounter (Signed)
Refill Request  HYDROcodone-acetaminophen (NORCO/VICODIN) 5-325 MG tablet   [id number] - [place], [place] - [address]   

## 2022-07-22 ENCOUNTER — Ambulatory Visit (INDEPENDENT_AMBULATORY_CARE_PROVIDER_SITE_OTHER): Payer: Medicare Other | Admitting: Podiatry

## 2022-07-22 ENCOUNTER — Encounter: Payer: Self-pay | Admitting: Podiatry

## 2022-07-22 DIAGNOSIS — G808 Other cerebral palsy: Secondary | ICD-10-CM | POA: Diagnosis not present

## 2022-07-22 DIAGNOSIS — I872 Venous insufficiency (chronic) (peripheral): Secondary | ICD-10-CM

## 2022-07-22 DIAGNOSIS — L84 Corns and callosities: Secondary | ICD-10-CM | POA: Diagnosis not present

## 2022-07-22 DIAGNOSIS — M79675 Pain in left toe(s): Secondary | ICD-10-CM | POA: Diagnosis not present

## 2022-07-22 DIAGNOSIS — B351 Tinea unguium: Secondary | ICD-10-CM

## 2022-07-22 DIAGNOSIS — N183 Chronic kidney disease, stage 3 unspecified: Secondary | ICD-10-CM

## 2022-07-22 DIAGNOSIS — N2889 Other specified disorders of kidney and ureter: Secondary | ICD-10-CM

## 2022-07-22 DIAGNOSIS — M79674 Pain in right toe(s): Secondary | ICD-10-CM | POA: Diagnosis not present

## 2022-07-22 NOTE — Progress Notes (Signed)
This patient returns to the office for evaluation and treatment of long thick painful nails .  This patient is unable to trim her own nails since the patient cannot reach her feet.  Patient says the nails are painful walking and wearing his shoes. She returns for preventive foot care services. Patient has venous insufficiency and kidney disease.  General Appearance  Alert, conversant and in no acute stress.  Vascular  Dorsalis pedis and posterior tibial  pulses are palpable  bilaterally.  Capillary return is within normal limits  bilaterally. Temperature is within normal limits  bilaterally.  Neurologic  Senn-Weinstein monofilament wire test within normal limits  bilaterally. Muscle power within normal limits bilaterally.  Nails Thick disfigured discolored nails with subungual debris  from hallux to fifth toes bilaterally. No evidence of bacterial infection or drainage bilaterally.  Orthopedic  No limitations of motion  feet .  No crepitus or effusions noted.  No bony pathology or digital deformities noted. Severe HAV  B/L.  Hammer toes  B/L.   Flattened foot profile right foot with prominent navicular.  Skin  normotropic skin with no porokeratosis noted bilaterally.  No signs of infections or ulcers noted.   Callus sub 1st  MCJ right foot asymptomatic.  Corn fifth toe left foot.  Onychomycosis  Pain in toes right foot  Pain in toes left foot  Debridement  of nails  1-5  B/L with a nail nipper.  Nails were then filed using a dremel tool with no incidents.  Debride corn with # 15 blade followed by dremel tool usage.  RTC   3 months   Gardiner Barefoot DPM

## 2022-08-13 NOTE — Progress Notes (Signed)
Kathy Owens arrives for routine 88-monthfollow-up of HTN, cerebral palsy with functional impairment, chronic pain, and osteoporosis.  At last visit, she was experiencing positional dizziness when upright, and BP was notably normal despite not having taken her antihypertensive.  I had recommended that she stop her blood pressure medication but she was uncomfortable with that with this plan.  We agreed that she would continue to take it but would notify me if her dizziness worsened and we would readdress.  She reports that she has had no subsequent dizziness despite taking her medicine daily.  Having difficulty with right medial foot pain where collapsed arch foot deformity has caused protrusion of bone and pressure against shoe resulting in pain. She is more interested in an accommodating custom shoe than surgery.  Also having difficulty with R foot/leg not "cooperating" - when walking, the R foot will feel "stuck" to the floor, momentarily immobile, which lasts for a few minutes before it spontaneously relaxes and she can go on her way.  She wonders if this is a muscle spasm (it isn't painful) and wishes I would prescribe muscle relaxant (I had avoided this in combination with her chronic daily opioids for generalized pain associated with CP x years).  Feels her ears are occluded with wax again.   Physical exam: BP 134/71 (BP Location: Right Arm, Patient Position: Sitting, Cuff Size: Small)   Pulse 72   Temp 98.1 F (36.7 C) (Oral)   Ht '4\' 11"'$  (1.499 m)   Wt 146 lb (66.2 kg)   SpO2 100%   BMI 29.49 kg/m  (Took antihypertensive this morning) R ear canal occluded with hard dry wax.  L ear canal patent.  Mood is positive, affect bright.  Laughs easily.  R medial foot indeed has blanching erythematous bony navicular protuberance; arch is flat.  Pulses are 1+ each dp. Nails recently trimmed by podiatrist.  She is able to flex R hip and knee and dorsi/plantar flex foot with response to her occasional  difficulty picking up her R foot during walking.  Assessment and Plan:  Essential hypertension 134/71 on Maxzide 37.5/25, not rechecked.  No further dizziness as described at last visit.  She will continue her medication; recheck renal fxn and electrolytes today.  Osteoporosis After discussion about vit D levels (due for recheck), she agreed to restart Fosamax! Earlier resistance was because of the inconvenience of the dosing instructions.  Chronic renal insufficiency, stage 3 (moderate) (HCC) Due for recheck today.    Chronic pain due to cerebral palsy Refilled opioid for chronic generalized pain.  She tolerates the medication without side effect.    Cerebral palsy ([place] Continues to walk with walker.  Her report today of freezing gait (R foot only) may reflect a spasm, though I don't know.  Discussed referral to PMR to determine if there are suggestions to help maximize her functional ability.  She is very interested. Hoping to avoid muscle relaxants while she is on chronic opioids to minimize risk of falls.  Hearing loss due to cerumen impaction, right Hard, dry impacted cerumen could not be removed today.  Advised use of wax softener (she has at home) prior to visit so that we can scoop and flush the wax.  Pain of right midfoot Flat arch, prominent navicular joint causes protuberance and pressure point in her shoe.  If her podiatrist doesn't suggest custom shoe, I'll refer her to [employer].   Agrees to flu and pneumococcal vaccines today  Recheck 76M, earlier if needed

## 2022-08-14 ENCOUNTER — Encounter: Payer: Self-pay | Admitting: Internal Medicine

## 2022-08-14 ENCOUNTER — Other Ambulatory Visit: Payer: Self-pay | Admitting: *Deleted

## 2022-08-14 ENCOUNTER — Ambulatory Visit (INDEPENDENT_AMBULATORY_CARE_PROVIDER_SITE_OTHER): Payer: Medicare Other | Admitting: Internal Medicine

## 2022-08-14 VITALS — BP 134/71 | HR 72 | Temp 98.1°F | Ht 59.0 in | Wt 146.0 lb

## 2022-08-14 DIAGNOSIS — G894 Chronic pain syndrome: Secondary | ICD-10-CM

## 2022-08-14 DIAGNOSIS — G809 Cerebral palsy, unspecified: Secondary | ICD-10-CM | POA: Diagnosis not present

## 2022-08-14 DIAGNOSIS — N2889 Other specified disorders of kidney and ureter: Secondary | ICD-10-CM

## 2022-08-14 DIAGNOSIS — I129 Hypertensive chronic kidney disease with stage 1 through stage 4 chronic kidney disease, or unspecified chronic kidney disease: Secondary | ICD-10-CM

## 2022-08-14 DIAGNOSIS — N183 Chronic kidney disease, stage 3 unspecified: Secondary | ICD-10-CM

## 2022-08-14 DIAGNOSIS — M810 Age-related osteoporosis without current pathological fracture: Secondary | ICD-10-CM | POA: Diagnosis not present

## 2022-08-14 DIAGNOSIS — I10 Essential (primary) hypertension: Secondary | ICD-10-CM

## 2022-08-14 DIAGNOSIS — K529 Noninfective gastroenteritis and colitis, unspecified: Secondary | ICD-10-CM

## 2022-08-14 DIAGNOSIS — E785 Hyperlipidemia, unspecified: Secondary | ICD-10-CM

## 2022-08-14 DIAGNOSIS — Z0000 Encounter for general adult medical examination without abnormal findings: Secondary | ICD-10-CM

## 2022-08-14 DIAGNOSIS — Z23 Encounter for immunization: Secondary | ICD-10-CM | POA: Diagnosis not present

## 2022-08-14 DIAGNOSIS — M79671 Pain in right foot: Secondary | ICD-10-CM

## 2022-08-14 DIAGNOSIS — H6121 Impacted cerumen, right ear: Secondary | ICD-10-CM

## 2022-08-14 MED ORDER — ALENDRONATE SODIUM 70 MG PO TABS: 70.0000 mg | ORAL_TABLET | ORAL | 11 refills | Status: DC

## 2022-08-14 NOTE — Telephone Encounter (Signed)
Call from pt who stated she saw her doctor today and needs her meds refilled.

## 2022-08-14 NOTE — Patient Instructions (Addendum)
Kathy Owens,  Always plan to catch up with you!  Please keep taking your blood pressure medicine.  Thank you for restarting her Fosamax!  We discussed a referral to a rehab specialist to discuss the trouble you are having with your right foot.  We also discussed a referral to a specialist who can build shoes specifically for your foot shape.  If your podiatrist does not suggest this, please let me know and I will send you to a company which makes custom shoes.  The earwax in your right ear is packed tightly and is very dry.  I am sorry we were able to get it out today, and recommend that you use wax softening drops before your next visit.  Thank you for getting your flu shot and your pneumonia vaccine updated today!  We are rechecking your kidney blood test today and I will call with results next week.  Take care and stay well,  Dr. Jimmye Norman

## 2022-08-15 DIAGNOSIS — H6121 Impacted cerumen, right ear: Secondary | ICD-10-CM | POA: Insufficient documentation

## 2022-08-15 LAB — BASIC METABOLIC PANEL WITH GFR
BUN/Creatinine Ratio: 24 (ref 12–28)
BUN: 27 mg/dL (ref 8–27)
CO2: 18 mmol/L — ABNORMAL LOW (ref 20–29)
Calcium: 9.3 mg/dL (ref 8.7–10.3)
Chloride: 107 mmol/L — ABNORMAL HIGH (ref 96–106)
Creatinine, Ser: 1.14 mg/dL — ABNORMAL HIGH (ref 0.57–1.00)
Glucose: 93 mg/dL (ref 70–99)
Potassium: 4.2 mmol/L (ref 3.5–5.2)
Sodium: 147 mmol/L — ABNORMAL HIGH (ref 134–144)
eGFR: 53 mL/min/1.73 — ABNORMAL LOW

## 2022-08-15 LAB — VITAMIN D 25 HYDROXY (VIT D DEFICIENCY, FRACTURES): Vit D, 25-Hydroxy: 12.8 ng/mL — ABNORMAL LOW (ref 30.0–100.0)

## 2022-08-15 MED ORDER — TRIAMTERENE-HCTZ 37.5-25 MG PO TABS: 1.0000 | ORAL_TABLET | Freq: Every day | ORAL | 3 refills | Status: DC

## 2022-08-15 MED ORDER — HYDROCODONE-ACETAMINOPHEN 5-325 MG PO TABS
1.0000 | ORAL_TABLET | Freq: Four times a day (QID) | ORAL | 0 refills | Status: DC | PRN
Start: 2022-08-15 — End: 2022-09-09

## 2022-08-15 MED ORDER — ATORVASTATIN CALCIUM 10 MG PO TABS
ORAL_TABLET | ORAL | 3 refills | Status: DC
Start: 2022-08-15 — End: 2023-04-21

## 2022-08-15 MED ORDER — LOPERAMIDE HCL 2 MG PO CAPS: 2.0000 mg | ORAL_CAPSULE | Freq: Every day | ORAL | 3 refills | Status: DC | PRN

## 2022-08-15 NOTE — Assessment & Plan Note (Signed)
Hard, dry impacted cerumen could not be removed today.  Advised use of wax softener (she has at home) prior to visit so that we can scoop and flush the wax.

## 2022-08-15 NOTE — Assessment & Plan Note (Signed)
After discussion about vit D levels (due for recheck), she agreed to restart Fosamax! Earlier resistance was because of the inconvenience of the dosing instructions.

## 2022-08-15 NOTE — Assessment & Plan Note (Signed)
Continues to walk with walker.  Her report today of freezing gait (R foot only) may reflect a spasm, though I don't know.  Discussed referral to PMR to determine if there are suggestions to help maximize her functional ability.  She is very interested. Hoping to avoid muscle relaxants while she is on chronic opioids to minimize risk of falls.

## 2022-08-15 NOTE — Assessment & Plan Note (Addendum)
Refilled opioid for chronic generalized pain.  She tolerates the medication without side effect.

## 2022-08-15 NOTE — Assessment & Plan Note (Signed)
Flat arch, prominent navicular joint causes protuberance and pressure point in her shoe.  If her podiatrist doesn't suggest custom shoe, I'll refer her to [facility].

## 2022-08-15 NOTE — Assessment & Plan Note (Signed)
Due for recheck today.

## 2022-08-15 NOTE — Assessment & Plan Note (Addendum)
134/71 on Maxzide 37.5/25, not rechecked.  No further dizziness as described at last visit.  She will continue her medication; recheck renal fxn and electrolytes today.

## 2022-08-21 ENCOUNTER — Telehealth: Payer: Self-pay

## 2022-08-21 NOTE — Telephone Encounter (Signed)
Requesting lab results, please call pt back.  

## 2022-08-22 ENCOUNTER — Other Ambulatory Visit: Payer: Self-pay | Admitting: Internal Medicine

## 2022-08-22 DIAGNOSIS — M810 Age-related osteoporosis without current pathological fracture: Secondary | ICD-10-CM

## 2022-08-22 MED ORDER — VITAMIN D3 1.25 MG (50000 UT) PO TABS: 50000.0000 [IU] | ORAL_TABLET | ORAL | 0 refills | Status: AC

## 2022-08-22 NOTE — Telephone Encounter (Signed)
Lab results called to and discussed with Kathy Owens

## 2022-09-09 ENCOUNTER — Other Ambulatory Visit: Payer: Self-pay | Admitting: Internal Medicine

## 2022-09-09 DIAGNOSIS — G894 Chronic pain syndrome: Secondary | ICD-10-CM

## 2022-09-09 MED ORDER — HYDROCODONE-ACETAMINOPHEN 5-325 MG PO TABS: 1.0000 | ORAL_TABLET | Freq: Four times a day (QID) | ORAL | 0 refills | Status: DC | PRN

## 2022-09-09 NOTE — Telephone Encounter (Signed)
Refill Request  HYDROcodone-acetaminophen (NORCO/VICODIN) 5-325 MG tablet   [id number] - [place], [place] - [address]

## 2022-09-23 ENCOUNTER — Encounter: Payer: Self-pay | Admitting: *Deleted

## 2022-09-23 NOTE — Progress Notes (Signed)
[facility] Quality Team Note  Name: Kathy Owens Date of Birth: November 17, 1955 MRN: [id number] Date: 09/23/2022  [facility] Quality Team has reviewed this patient's chart, please see recommendations below:  [facility] Quality Other; (Pt has open gap for mammogram.  Pt declined in 2022.  If pt changes her mind, would need completed before end of 2023 to close gap.)

## 2022-10-02 ENCOUNTER — Other Ambulatory Visit: Payer: Self-pay

## 2022-10-02 DIAGNOSIS — G894 Chronic pain syndrome: Secondary | ICD-10-CM

## 2022-10-03 MED ORDER — HYDROCODONE-ACETAMINOPHEN 5-325 MG PO TABS: 1.0000 | ORAL_TABLET | Freq: Four times a day (QID) | ORAL | 0 refills | Status: DC | PRN

## 2022-10-03 NOTE — Telephone Encounter (Signed)
  Last UDS 2017  Last OV 10/26  No future appt   Recent Dispenses     09/17/2022 5-325 MG TABS (disp 120, 30d supply)

## 2022-10-22 ENCOUNTER — Ambulatory Visit: Payer: Medicare Other | Admitting: Podiatry

## 2022-11-07 ENCOUNTER — Other Ambulatory Visit: Payer: Self-pay

## 2022-11-07 DIAGNOSIS — G894 Chronic pain syndrome: Secondary | ICD-10-CM

## 2022-11-07 MED ORDER — HYDROCODONE-ACETAMINOPHEN 5-325 MG PO TABS: 1.0000 | ORAL_TABLET | Freq: Four times a day (QID) | ORAL | 0 refills | Status: DC | PRN

## 2022-12-04 ENCOUNTER — Other Ambulatory Visit: Payer: Self-pay

## 2022-12-04 DIAGNOSIS — G894 Chronic pain syndrome: Secondary | ICD-10-CM

## 2022-12-04 MED ORDER — HYDROCODONE-ACETAMINOPHEN 5-325 MG PO TABS: 1.0000 | ORAL_TABLET | Freq: Four times a day (QID) | ORAL | 0 refills | Status: DC | PRN

## 2022-12-09 ENCOUNTER — Telehealth: Payer: Self-pay | Admitting: Internal Medicine

## 2022-12-09 NOTE — Telephone Encounter (Signed)
Rec'd call from pt requesting a new [place].  Pt states her current walker is broken.  Will this pt require an appointment or can a New DME Referral be placed?

## 2022-12-10 ENCOUNTER — Other Ambulatory Visit: Payer: Self-pay | Admitting: Internal Medicine

## 2022-12-10 DIAGNOSIS — G801 Spastic diplegic cerebral palsy: Secondary | ICD-10-CM

## 2022-12-15 ENCOUNTER — Other Ambulatory Visit: Payer: Self-pay | Admitting: Internal Medicine

## 2022-12-15 DIAGNOSIS — G809 Cerebral palsy, unspecified: Secondary | ICD-10-CM

## 2022-12-30 ENCOUNTER — Other Ambulatory Visit: Payer: Self-pay

## 2022-12-30 DIAGNOSIS — G894 Chronic pain syndrome: Secondary | ICD-10-CM

## 2022-12-30 NOTE — Telephone Encounter (Signed)
Last rx written - 12/04/22. Last OV - 08/14/22. Next OV - 02/12/23. TOX - 06/26/16.

## 2023-01-05 ENCOUNTER — Telehealth: Payer: Self-pay | Admitting: Internal Medicine

## 2023-01-05 MED ORDER — HYDROCODONE-ACETAMINOPHEN 5-325 MG PO TABS: 1.0000 | ORAL_TABLET | Freq: Four times a day (QID) | ORAL | 0 refills | Status: DC | PRN

## 2023-01-05 NOTE — Telephone Encounter (Signed)
Pt requesting a call back. Pt states she called on 12/30/2022 for her Pain medication and it has not be filled yet. Pt wanting to know why has it not be called in yet.   HYDROcodone-acetaminophen (NORCO/VICODIN) 5-325 MG tablet  [facility] [id number] - [place], [place] - [address]

## 2023-01-05 NOTE — Telephone Encounter (Signed)
Patient called in very upset that Rx  for hydrocodone has not been sent. First request was 3/12.

## 2023-01-12 ENCOUNTER — Ambulatory Visit: Payer: 59 | Admitting: Podiatry

## 2023-01-29 ENCOUNTER — Other Ambulatory Visit: Payer: Self-pay

## 2023-01-29 DIAGNOSIS — G894 Chronic pain syndrome: Secondary | ICD-10-CM

## 2023-01-29 MED ORDER — HYDROCODONE-ACETAMINOPHEN 5-325 MG PO TABS: 1.0000 | ORAL_TABLET | Freq: Four times a day (QID) | ORAL | 0 refills | Status: DC | PRN

## 2023-02-12 ENCOUNTER — Encounter: Payer: Medicare Other | Admitting: Internal Medicine

## 2023-03-02 ENCOUNTER — Other Ambulatory Visit: Payer: Self-pay

## 2023-03-02 DIAGNOSIS — G894 Chronic pain syndrome: Secondary | ICD-10-CM

## 2023-03-02 MED ORDER — HYDROCODONE-ACETAMINOPHEN 5-325 MG PO TABS: 1.0000 | ORAL_TABLET | Freq: Four times a day (QID) | ORAL | 0 refills | Status: DC | PRN

## 2023-03-05 ENCOUNTER — Encounter: Payer: Medicare Other | Admitting: Internal Medicine

## 2023-03-30 ENCOUNTER — Other Ambulatory Visit: Payer: Self-pay

## 2023-03-30 DIAGNOSIS — G894 Chronic pain syndrome: Secondary | ICD-10-CM

## 2023-03-30 NOTE — Telephone Encounter (Signed)
Last rx written  03/02/23. Last OV  08/14/22. Next OV  04/16/23. TOX  06/26/16.

## 2023-03-31 MED ORDER — HYDROCODONE-ACETAMINOPHEN 5-325 MG PO TABS: 1.0000 | ORAL_TABLET | Freq: Four times a day (QID) | ORAL | 0 refills | Status: DC | PRN

## 2023-04-16 ENCOUNTER — Ambulatory Visit (INDEPENDENT_AMBULATORY_CARE_PROVIDER_SITE_OTHER): Payer: 59 | Admitting: Internal Medicine

## 2023-04-16 VITALS — BP 142/75 | HR 56 | Temp 97.6°F | Ht 59.0 in

## 2023-04-16 DIAGNOSIS — E785 Hyperlipidemia, unspecified: Secondary | ICD-10-CM

## 2023-04-16 DIAGNOSIS — I129 Hypertensive chronic kidney disease with stage 1 through stage 4 chronic kidney disease, or unspecified chronic kidney disease: Secondary | ICD-10-CM

## 2023-04-16 DIAGNOSIS — G894 Chronic pain syndrome: Secondary | ICD-10-CM

## 2023-04-16 DIAGNOSIS — N183 Chronic kidney disease, stage 3 unspecified: Secondary | ICD-10-CM | POA: Diagnosis not present

## 2023-04-16 DIAGNOSIS — E7841 Elevated Lipoprotein(a): Secondary | ICD-10-CM

## 2023-04-16 DIAGNOSIS — G8929 Other chronic pain: Secondary | ICD-10-CM

## 2023-04-16 DIAGNOSIS — G809 Cerebral palsy, unspecified: Secondary | ICD-10-CM

## 2023-04-16 DIAGNOSIS — M810 Age-related osteoporosis without current pathological fracture: Secondary | ICD-10-CM | POA: Diagnosis not present

## 2023-04-16 DIAGNOSIS — I10 Essential (primary) hypertension: Secondary | ICD-10-CM

## 2023-04-16 MED ORDER — TRIAMTERENE-HCTZ 37.5-25 MG PO TABS: 1.0000 | ORAL_TABLET | Freq: Every day | ORAL | 3 refills | Status: DC

## 2023-04-16 MED ORDER — BACLOFEN 5 MG PO TABS: 5.0000 mg | ORAL_TABLET | Freq: Two times a day (BID) | ORAL | 1 refills | Status: DC | PRN

## 2023-04-16 MED ORDER — ALENDRONATE SODIUM 70 MG PO TABS: 70.0000 mg | ORAL_TABLET | ORAL | 11 refills | Status: DC

## 2023-04-16 NOTE — Patient Instructions (Signed)
Always great to see you, Kathy Owens!  We are going to start a low dose muscle relaxant to see if it will help with your leg and foot spasms.  Please pay attention to how you feel.  If it is too strong, you may take a 1/2 a pill.  If you don't like how it makes you feel, please stop it!  We are stopping your daily aspirin; you no longer need it!  I will refill your medicines as soon as I see today's blood work.  The results help me know what doses to use.  I hope the physical therapy is helpful!  I've place the order - you will receive a phone call to set this up.    I'll plan to see you in 6 months!  Take care and stay well,  Dr. Mayford Knife

## 2023-04-18 LAB — BMP8+ANION GAP
Anion Gap: 17 mmol/L (ref 10.0–18.0)
BUN/Creatinine Ratio: 27 (ref 12–28)
BUN: 30 mg/dL — ABNORMAL HIGH (ref 8–27)
CO2: 20 mmol/L (ref 20–29)
Calcium: 9.7 mg/dL (ref 8.7–10.3)
Chloride: 103 mmol/L (ref 96–106)
Creatinine, Ser: 1.13 mg/dL — ABNORMAL HIGH (ref 0.57–1.00)
Glucose: 89 mg/dL (ref 70–99)
Potassium: 4.3 mmol/L (ref 3.5–5.2)
Sodium: 140 mmol/L (ref 134–144)
eGFR: 53 mL/min/{1.73_m2} — ABNORMAL LOW (ref >59)

## 2023-04-18 LAB — LIPID PANEL
Chol/HDL Ratio: 3.1 ratio (ref 0.0–4.4)
Cholesterol, Total: 194 mg/dL (ref 100–199)
HDL: 63 mg/dL (ref >39)
LDL Chol Calc (NIH): 111 mg/dL — ABNORMAL HIGH (ref 0–99)
Triglycerides: 115 mg/dL (ref 0–149)
VLDL Cholesterol Cal: 20 mg/dL (ref 5–40)

## 2023-04-18 LAB — VITAMIN D 25 HYDROXY (VIT D DEFICIENCY, FRACTURES): Vit D, 25-Hydroxy: 30 ng/mL (ref 30.0–100.0)

## 2023-04-20 ENCOUNTER — Telehealth: Payer: Self-pay

## 2023-04-20 NOTE — Telephone Encounter (Signed)
Pt is requesting a call back ... She is wanting he lab results from her last OV .Marland Kitchen

## 2023-04-21 ENCOUNTER — Other Ambulatory Visit: Payer: Self-pay | Admitting: Internal Medicine

## 2023-04-21 DIAGNOSIS — G894 Chronic pain syndrome: Secondary | ICD-10-CM

## 2023-04-21 DIAGNOSIS — E785 Hyperlipidemia, unspecified: Secondary | ICD-10-CM

## 2023-04-21 MED ORDER — ATORVASTATIN CALCIUM 10 MG PO TABS: ORAL_TABLET | ORAL | 3 refills | Status: DC

## 2023-04-21 MED ORDER — HYDROCODONE-ACETAMINOPHEN 5-325 MG PO TABS
1.0000 | ORAL_TABLET | Freq: Four times a day (QID) | ORAL | 0 refills | Status: DC | PRN
Start: 2023-04-28 — End: 2023-05-25

## 2023-04-28 NOTE — Assessment & Plan Note (Signed)
Due for lipid panel today.  On atorvastatin 10 mg daily. Primary prevention.

## 2023-04-28 NOTE — Assessment & Plan Note (Signed)
142/75 on Maxzide 37.5/25.  She is uncomfortable today.  Check renal fxn and electrolytes to monitor medication today.

## 2023-04-28 NOTE — Assessment & Plan Note (Signed)
Spasticity pain continues, incompletely relieved by Vicodin.  We discussed benefits and risks of trying a low dose Baclofen which she very much wants to try.  Baclofen 5 mg bid prescribed with instructions to monitor for side effects and to reduce dose to 1/2 tab if she feels it is too strong.  Goal is to reduce frequency of Vicodin.  She was unable to get to PMR due to transportation barriers.  We will try home PT to work on stretching and optimizing her ambulation with walker in order to maintain independence in her home.

## 2023-04-28 NOTE — Assessment & Plan Note (Signed)
Endorses adherence to Fosamax w/o side effects.  Check vit D today.  No falls!

## 2023-04-28 NOTE — Progress Notes (Signed)
Kathy Owens is here for her 74M f/u, doing well except for her ongoing pain associated with CP spasticity for which she takes Vicodin with fair relief.  Her L foot gives her the most problem.  Otherwise no complaints. Her problems and medications were reviewed together.  BP (!) 142/75 (BP Location: Right Arm, Patient Position: Sitting, Cuff Size: Small)   Pulse (!) 56   Temp 97.6 F (36.4 C) (Oral)   Ht 4\' 11"  (1.499 m)   SpO2 100%   BMI 29.49 kg/m  Lungs clear, heart RRR without significant murmur.  Legs with L>RLE edema, chronic.  Spastic deformities of feet present.  Motor tone is increased generally.     Assessment and plan:  Essential hypertension 142/75 on Maxzide 37.5/25.  She is uncomfortable today.  Check renal fxn and electrolytes to monitor medication today.  Cerebral palsy (HCC) Spasticity pain continues, incompletely relieved by Vicodin.  We discussed benefits and risks of trying a low dose Baclofen which she very much wants to try.  Baclofen 5 mg bid prescribed with instructions to monitor for side effects and to reduce dose to 1/2 tab if she feels it is too strong.  Goal is to reduce frequency of Vicodin.  She was unable to get to PMR due to transportation barriers.  We will try home PT to work on stretching and optimizing her ambulation with walker in order to maintain independence in her home.  Osteoporosis Endorses adherence to Fosamax w/o side effects.  Check vit D today.  No falls!   Chronic pain due to cerebral palsy See CP above  Hyperlipemia Due for lipid panel today.  On atorvastatin 10 mg daily. Primary prevention.   Kathy Owens doesn't enjoy coming to the office and would like to come in every year.  I suggested 45m to which she begrudgingly agreed.

## 2023-04-28 NOTE — Assessment & Plan Note (Signed)
See CP above. 

## 2023-05-14 ENCOUNTER — Emergency Department (HOSPITAL_COMMUNITY)
Admission: EM | Admit: 2023-05-14 | Discharge: 2023-05-14 | Disposition: A | Discharge status: Home / Self Care | Payer: 59 | Attending: Emergency Medicine | Admitting: Emergency Medicine

## 2023-05-14 ENCOUNTER — Encounter (HOSPITAL_COMMUNITY): Payer: Self-pay

## 2023-05-14 ENCOUNTER — Emergency Department (HOSPITAL_COMMUNITY): Payer: 59

## 2023-05-14 ENCOUNTER — Other Ambulatory Visit: Payer: Self-pay

## 2023-05-14 DIAGNOSIS — W19XXXA Unspecified fall, initial encounter: Secondary | ICD-10-CM | POA: Insufficient documentation

## 2023-05-14 DIAGNOSIS — I10 Essential (primary) hypertension: Secondary | ICD-10-CM | POA: Diagnosis not present

## 2023-05-14 DIAGNOSIS — Z79899 Other long term (current) drug therapy: Secondary | ICD-10-CM | POA: Insufficient documentation

## 2023-05-14 DIAGNOSIS — Y92000 Kitchen of unspecified non-institutional (private) residence as  the place of occurrence of the external cause: Secondary | ICD-10-CM | POA: Insufficient documentation

## 2023-05-14 DIAGNOSIS — M79671 Pain in right foot: Secondary | ICD-10-CM | POA: Insufficient documentation

## 2023-05-14 NOTE — ED Triage Notes (Signed)
Pt arrived from home via [facility] s/p mechanical fall with right foot pain per pt 20/10. Right foot noted to have discoloration along the base of 4th and 5th digits, the lateral side and the sole of the foot.

## 2023-05-14 NOTE — Discharge Instructions (Addendum)
Can wear ace wrap for comfort, foot should gradually improve. Continue your usual hydrocodone. Follow-up with your primary care doctor. Return here for new concerns.

## 2023-05-14 NOTE — ED Provider Notes (Signed)
[place] EMERGENCY DEPARTMENT AT [facility] Provider Note   CSN: [id number] Arrival date & time: 05/14/23  0443     History  Chief Complaint  Patient presents with   Foot Pain    Rhylan K [place] is a 67 y.o. female.  The history is provided by the patient and medical records.  Foot Pain   67 y.o. F with hx of cerebral palsy, chronic pain, HTN, venous insufficiency, presenting to the ED for right foot pain.  States she feel in the kitchen around 7PM and called EMS but declined transport.  States pain has worsened in right foot.  States pain worse along dorsal foot and great toe.  Denies numbness/tingling.    Home Medications Prior to Admission medications   Medication Sig Start Date End Date Taking? Authorizing Provider  alendronate (FOSAMAX) 70 MG tablet Take 1 tablet (70 mg total) by mouth every 7 (seven) days. Take with a full glass of water on an empty stomach. 04/16/23 04/15/24  Miguel Aschoff, MD  atorvastatin (LIPITOR) 10 MG tablet TAKE 1 TABLET(10 MG) BY MOUTH DAILY 04/21/23   Miguel Aschoff, MD  Baclofen 5 MG TABS Take 1 tablet (5 mg total) by mouth 2 (two) times daily as needed (for muscle spasm pain). 04/16/23 06/15/23  Miguel Aschoff, MD  diclofenac Sodium (VOLTAREN) 1 % GEL Apply 2 g topically 4 (four) times daily as needed. 02/01/21   Miguel Aschoff, MD  HYDROcodone-acetaminophen (NORCO/VICODIN) 5-325 MG tablet Take 1 tablet by mouth every 6 (six) hours as needed for moderate pain or severe pain. 04/28/23 05/28/23  Miguel Aschoff, MD  loperamide (IMODIUM) 2 MG capsule Take 1 capsule (2 mg total) by mouth daily as needed for up to 360 doses for diarrhea or loose stools. 08/15/22   Miguel Aschoff, MD  triamterene-hydrochlorothiazide (MAXZIDE-25) 37.5-25 MG tablet Take 1 tablet by mouth daily. 04/16/23   Miguel Aschoff, MD      Allergies    Vancomycin and Morphine    Review of Systems   Review of Systems  Musculoskeletal:   Positive for arthralgias.  All other systems reviewed and are negative.   Physical Exam Updated Vital Signs BP (!) 157/76 (BP Location: Left Arm)   Pulse 66   Temp 97.9 F (36.6 C) (Oral)   Resp 17   SpO2 98%   Physical Exam Vitals and nursing note reviewed.  Constitutional:      Appearance: She is well-developed.  HENT:     Head: Normocephalic and atraumatic.  Eyes:     Conjunctiva/sclera: Conjunctivae normal.     Pupils: Pupils are equal, round, and reactive to light.  Cardiovascular:     Rate and Rhythm: Normal rate and regular rhythm.     Heart sounds: Normal heart sounds.  Pulmonary:     Effort: Pulmonary effort is normal.     Breath sounds: Normal breath sounds.  Abdominal:     General: Bowel sounds are normal.     Palpations: Abdomen is soft.  Musculoskeletal:        General: Normal range of motion.     Cervical back: Normal range of motion.     Comments: Chronic deformities noted of the feet, no acute deformities  Some bruising noted along 5th toe, no acute deformity, DP pulse intact  Skin:    General: Skin is warm and dry.  Neurological:     Mental Status: She is alert and oriented to person, place, and time.  ED Results / Procedures / Treatments   Labs (all labs ordered are listed, but only abnormal results are displayed) Labs Reviewed - No data to display  EKG None  Radiology DG Foot Complete Right  Result Date: 05/14/2023 CLINICAL DATA:  67 year old female with history of trauma from a fall complaining of right-sided foot pain. EXAM: RIGHT FOOT COMPLETE - 3+ VIEW COMPARISON:  No priors. FINDINGS: Three nonstandard views of the right foot demonstrate no definite acute displaced fracture or dislocation. Pes planus. Multifocal joint space narrowing, subchondral sclerosis, subchondral cyst formation and osteophyte formation throughout the foot indicative of osteoarthritis, most severe in the midfoot. 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 IMPRESSION: 1. No  acute radiographic abnormality of the right foot. 2. Advanced degenerative changes of osteoarthritis. 3. Pes planus. 4. 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 Electronically Signed   By: Trudie Reed M.D.   On: 05/14/2023 06:02    Procedures Procedures    Medications Ordered in ED Medications - No data to display  ED Course/ Medical Decision Making/ A&P                             Medical Decision Making Amount and/or Complexity of Data Reviewed Radiology: ordered and independent interpretation performed.   67 year old female presenting to the ED after mechanical fall yesterday around 7 PM.  EMS was called but declined transport initially, however pain has been worsening.  She has chronic deformities of her feet related to arthritis but does not have any acute bony deformity.  There is a scant amount of bruising along the fifth toe.  Her foot is neurovascularly intact.  X-rays negative for any acute bony findings.  Will place Ace wrap for comfort.  She is on chronic hydrocodone, can continue this.  Follow-up with PCP.  Return here for new concerns.  Final Clinical Impression(s) / ED Diagnoses Final diagnoses:  Foot pain, right    Rx / DC Orders ED Discharge Orders     None         Garlon Hatchet, PA-C 05/14/23 0612    Zadie Rhine, MD 05/14/23 [phone]

## 2023-05-15 ENCOUNTER — Other Ambulatory Visit: Payer: Self-pay | Admitting: Internal Medicine

## 2023-05-15 ENCOUNTER — Telehealth: Payer: Self-pay

## 2023-05-15 DIAGNOSIS — K529 Noninfective gastroenteritis and colitis, unspecified: Secondary | ICD-10-CM

## 2023-05-15 DIAGNOSIS — I10 Essential (primary) hypertension: Secondary | ICD-10-CM

## 2023-05-15 MED ORDER — VITAMIN D3 25 MCG PO TABS: 1000.0000 [IU] | ORAL_TABLET | Freq: Every day | ORAL | 3 refills | Status: DC

## 2023-05-15 NOTE — Telephone Encounter (Signed)
Rx refill for Vitamin D, medication has expired off of med. List  Pharmacy :[id number] - , [place] - [address] AT [facility] [address]

## 2023-05-21 ENCOUNTER — Other Ambulatory Visit: Payer: Self-pay | Admitting: *Deleted

## 2023-05-21 DIAGNOSIS — G809 Cerebral palsy, unspecified: Secondary | ICD-10-CM

## 2023-05-21 NOTE — Telephone Encounter (Signed)
Patient is currently taking PT .  Her Therapist suggested an increase I her Baclofen to make her Therapy easier.  States that she does have  a lot of stiffness.   Therapist told her that he has worked with a lot of patient's who take a higher dose.

## 2023-05-22 NOTE — Telephone Encounter (Signed)
Increasing Baclofen dose may be appropriate, but it would be best for her to discuss this fully with Dr. Mayford Knife first, as there will be some increased risk of side effects. Could you make her a follow up appointment? Perhaps a telephone visit with Dr. Mayford Knife?

## 2023-05-22 NOTE — Telephone Encounter (Signed)
RTC to patient given a TeleHealth  appointment on 05/25/2023 at 2:15  PM.  Patient stated will await call on 05/26/2923.

## 2023-05-22 NOTE — Telephone Encounter (Signed)
HYDROcodone-acetaminophen (NORCO/VICODIN) 5-325 MG tablet   [id number] - [place], [place] - [address] AT [facility] [address]

## 2023-05-22 NOTE — Telephone Encounter (Signed)
RTC to patient message left that she will need to schedule a appointment in Clinic or TeleHealth to speak with Dr. Mayford Knife about a change in dosing of the Baclofen.

## 2023-05-25 ENCOUNTER — Ambulatory Visit (INDEPENDENT_AMBULATORY_CARE_PROVIDER_SITE_OTHER): Payer: 59 | Admitting: Student

## 2023-05-25 ENCOUNTER — Encounter: Payer: Self-pay | Admitting: Student

## 2023-05-25 ENCOUNTER — Other Ambulatory Visit: Payer: Self-pay

## 2023-05-25 DIAGNOSIS — K529 Noninfective gastroenteritis and colitis, unspecified: Secondary | ICD-10-CM

## 2023-05-25 DIAGNOSIS — G894 Chronic pain syndrome: Secondary | ICD-10-CM

## 2023-05-25 DIAGNOSIS — I10 Essential (primary) hypertension: Secondary | ICD-10-CM | POA: Diagnosis not present

## 2023-05-25 DIAGNOSIS — G809 Cerebral palsy, unspecified: Secondary | ICD-10-CM

## 2023-05-25 MED ORDER — HYDROCODONE-ACETAMINOPHEN 5-325 MG PO TABS
1.0000 | ORAL_TABLET | Freq: Four times a day (QID) | ORAL | 0 refills | Status: DC | PRN
Start: 2023-05-25 — End: 2023-06-23

## 2023-05-25 MED ORDER — TRIAMTERENE-HCTZ 37.5-25 MG PO TABS
1.0000 | ORAL_TABLET | Freq: Every day | ORAL | 3 refills | Status: DC
Start: 2023-05-25 — End: 2024-06-16

## 2023-05-25 MED ORDER — LOPERAMIDE HCL 2 MG PO CAPS: 2.0000 mg | ORAL_CAPSULE | Freq: Every day | ORAL | 3 refills | Status: DC | PRN

## 2023-05-25 MED ORDER — BACLOFEN 5 MG PO TABS
5.0000 mg | ORAL_TABLET | Freq: Three times a day (TID) | ORAL | 1 refills | Status: DC | PRN
Start: 2023-05-25 — End: 2023-08-11

## 2023-05-25 NOTE — Assessment & Plan Note (Signed)
Patient requested a phone visit for cerebral palsy related spasticity. Since last seen in clinic, patient tried Baclofen 5 mg BID. She did not noticed significant improvement while taking it; however, after she ran out of the medication, she described worsened spasticity than before. Thus, she believes it was having some positive effects. She denies somnolence, confusion, or GI side effects.  Discussed the available therapies and how all muscle relaxants for CP relared spasticity have side effects. We reviewed recent BMP, which shows stable renal function. After shared decision making, patient opted for increasing Baclofen dosing slightly at 5 mg TID PRN. She will call the office for another telephone appointment at the end of the course if she doesn't think this medication is helping. If ineffective, would consider Tizanidine next. - Baclofen 5 mg TID PRN with one refil - Patient has continued on physical therapy -

## 2023-05-25 NOTE — Assessment & Plan Note (Signed)
Checked PDMP and refill is appropriate. Refilled medication today

## 2023-05-25 NOTE — Assessment & Plan Note (Signed)
Checked last BMP, stable. Refilled medication today
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
## 2023-05-26 NOTE — Progress Notes (Signed)
Internal Medicine Clinic Attending  Case discussed with the resident at the time of the visit.  We reviewed the resident's history and exam and pertinent patient test results.  I agree with the assessment, diagnosis, and plan of care documented in the resident's note.  

## 2023-06-09 ENCOUNTER — Encounter: Payer: 59 | Admitting: Internal Medicine

## 2023-06-17 ENCOUNTER — Ambulatory Visit (INDEPENDENT_AMBULATORY_CARE_PROVIDER_SITE_OTHER): Payer: 59

## 2023-06-17 VITALS — Ht 60.0 in | Wt 146.0 lb

## 2023-06-17 DIAGNOSIS — Z0000 Encounter for general adult medical examination without abnormal findings: Secondary | ICD-10-CM | POA: Diagnosis not present
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
## 2023-06-17 NOTE — Patient Instructions (Signed)
Kathy Owens , Thank you for taking time to come for your Medicare Wellness Visit. I appreciate your ongoing commitment to your health goals. Please review the following plan we discussed and let me know if I can assist you in the future.   Referrals/Orders/Follow-Ups/Clinician Recommendations: You are due for a Cologard and also a Mammogram.  You also are recommended to receive a Shingles vaccine, as you can get these at your local pharmacy.  It was talking with you today and keep up the good work.  This is a list of the screening recommended for you and due dates:  Health Maintenance  Topic Date Due   Zoster (Shingles) Vaccine (1 of 2) Never done   Flu Shot  05/21/2023   Medicare Annual Wellness Visit  06/16/2024   DTaP/Tdap/Td vaccine (3 - Td or Tdap) 02/09/2027   Pneumonia Vaccine  Completed   DEXA scan (bone density measurement)  Completed   Hepatitis C Screening  Completed   HPV Vaccine  Aged Out   Mammogram  Discontinued   Colon Cancer Screening  Discontinued   COVID-19 Vaccine  Discontinued    Advanced directives: (Copy Requested) Please bring a copy of your health care power of attorney and living will to the office to be added to your chart at your convenience.  Next Medicare Annual Wellness Visit scheduled for next year: Yes  Managing Pain Without Opioids Opioids are strong medicines used to treat moderate to severe pain. For some people, especially those who have long-term (chronic) pain, opioids may not be the best choice for pain management due to: Side effects like nausea, constipation, and sleepiness. The risk of addiction (opioid use disorder). The longer you take opioids, the greater your risk of addiction. Pain that lasts for more than 3 months is called chronic pain. Managing chronic pain usually requires more than one approach and is often provided by a team of health care providers working together (multidisciplinary approach). Pain management may be done at a pain  management center or pain clinic. How to manage pain without the use of opioids Use non-opioid medicines Non-opioid medicines for pain may include: Over-the-counter or prescription non-steroidal anti-inflammatory drugs (NSAIDs). These may be the first medicines used for pain. They work well for muscle and bone pain, and they reduce swelling. Acetaminophen. This over-the-counter medicine may work well for milder pain but not swelling. Antidepressants. These may be used to treat chronic pain. A certain type of antidepressant (tricyclics) is often used. These medicines are given in lower doses for pain than when used for depression. Anticonvulsants. These are usually used to treat seizures but may also reduce nerve (neuropathic) pain. Muscle relaxants. These relieve pain caused by sudden muscle tightening (spasms). You may also use a pain medicine that is applied to the skin as a patch, cream, or gel (topical analgesic), such as a numbing medicine. These may cause fewer side effects than medicines taken by mouth. Do certain therapies as directed Some therapies can help with pain management. They include: Physical therapy. You will do exercises to gain strength and flexibility. A physical therapist may teach you exercises to move and stretch parts of your body that are weak, stiff, or painful. You can learn these exercises at physical therapy visits and practice them at home. Physical therapy may also involve: Massage. Heat wraps or applying heat or cold to affected areas. Electrical signals that interrupt pain signals (transcutaneous electrical nerve stimulation, TENS). Weak lasers that reduce pain and swelling (low-level laser therapy). Signals  from your body that help you learn to regulate pain (biofeedback). Occupational therapy. This helps you to learn ways to function at home and work with less pain. Recreational therapy. This involves trying new activities or hobbies, such as a physical  activity or drawing. Mental health therapy, including: Cognitive behavioral therapy (CBT). This helps you learn coping skills for dealing with pain. Acceptance and commitment therapy (ACT) to change the way you think and react to pain. Relaxation therapies, including muscle relaxation exercises and mindfulness-based stress reduction. Pain management counseling. This may be individual, family, or group counseling.  Receive medical treatments Medical treatments for pain management include: Nerve block injections. These may include a pain blocker and anti-inflammatory medicines. You may have injections: Near the spine to relieve chronic back or neck pain. Into joints to relieve back or joint pain. Into nerve areas that supply a painful area to relieve body pain. Into muscles (trigger point injections) to relieve some painful muscle conditions. A medical device placed near your spine to help block pain signals and relieve nerve pain or chronic back pain (spinal cord stimulation device). Acupuncture. Follow these instructions at home Medicines Take over-the-counter and prescription medicines only as told by your health care provider. If you are taking pain medicine, ask your health care providers about possible side effects to watch out for. Do not drive or use heavy machinery while taking prescription opioid pain medicine. Lifestyle  Do not use drugs or alcohol to reduce pain. If you drink alcohol, limit how much you have to: 0-1 drink a day for women who are not pregnant. 0-2 drinks a day for men. Know how much alcohol is in a drink. In the [place] one drink equals one 12 oz bottle of beer (355 mL), one 5 oz glass of wine (148 mL), or one 1 oz glass of hard liquor (44 mL). Do not use any products that contain nicotine or tobacco. These products include cigarettes, chewing tobacco, and vaping devices, such as e-cigarettes. If you need help quitting, ask your health care provider. Eat a healthy  diet and maintain a healthy weight. Poor diet and excess weight may make pain worse. Eat foods that are high in fiber. These include fresh fruits and vegetables, whole grains, and beans. Limit foods that are high in fat and processed sugars, such as fried and sweet foods. Exercise regularly. Exercise lowers stress and may help relieve pain. Ask your health care provider what activities and exercises are safe for you. If your health care provider approves, join an exercise class that combines movement and stress reduction. Examples include yoga and tai chi. Get enough sleep. Lack of sleep may make pain worse. Lower stress as much as possible. Practice stress reduction techniques as told by your therapist. General instructions Work with all your pain management providers to find the treatments that work best for you. You are an important member of your pain management team. There are many things you can do to reduce pain on your own. Consider joining an online or in-person support group for people who have chronic pain. Keep all follow-up visits. This is important. Where to find more information You can find more information about managing pain without opioids from: American Academy of Pain Medicine: painmed.org Institute for Chronic Pain: instituteforchronicpain.org American Chronic Pain Association: theacpa.org Contact a health care provider if: You have side effects from pain medicine. Your pain gets worse or does not get better with treatments or home therapy. You are struggling with anxiety or  depression. Summary Many types of pain can be managed without opioids. Chronic pain may respond better to pain management without opioids. Pain is best managed when you and a team of health care providers work together. Pain management without opioids may include non-opioid medicines, medical treatments, physical therapy, mental health therapy, and lifestyle changes. Tell your health care providers  if your pain gets worse or is not being managed well enough. This information is not intended to replace advice given to you by your health care provider. Make sure you discuss any questions you have with your health care provider. Document Revised: 01/16/2021 Document Reviewed: 01/16/2021 Elsevier Patient Education  2024 [employer].

## 2023-06-23 ENCOUNTER — Other Ambulatory Visit: Payer: Self-pay | Admitting: Internal Medicine

## 2023-06-23 DIAGNOSIS — G894 Chronic pain syndrome: Secondary | ICD-10-CM

## 2023-06-23 MED ORDER — HYDROCODONE-ACETAMINOPHEN 5-325 MG PO TABS
1.0000 | ORAL_TABLET | Freq: Four times a day (QID) | ORAL | 0 refills | Status: DC | PRN
Start: 2023-06-23 — End: 2023-07-15

## 2023-06-23 NOTE — Telephone Encounter (Signed)
 HYDROcodone-acetaminophen (NORCO/VICODIN) 5-325 MG tablet   [id number] - [place], [place] - [address] AT [facility] OF [address] & [address]

## 2023-06-30 NOTE — Progress Notes (Signed)
Internal Medicine Clinic Attending I reviewed the AWV findings.  I agree with the assessment, diagnosis, and plan of care documented in the AWV note, though will speak to Kathy Owens about the colonoscopy/cologuard and mammography which she has been declining during our conversations.

## 2023-07-13 ENCOUNTER — Telehealth: Payer: Self-pay

## 2023-07-13 NOTE — Telephone Encounter (Addendum)
Patient called regarding medication baclofen, patient stated one of her doctors told her that the medication could cause her kidneys to be damaged more, patient is requesting a alternative medication. Please return patients call.

## 2023-07-15 ENCOUNTER — Telehealth: Payer: Self-pay

## 2023-07-15 ENCOUNTER — Other Ambulatory Visit: Payer: Self-pay

## 2023-07-15 DIAGNOSIS — G894 Chronic pain syndrome: Secondary | ICD-10-CM

## 2023-07-15 MED ORDER — HYDROCODONE-ACETAMINOPHEN 5-325 MG PO TABS
1.0000 | ORAL_TABLET | Freq: Four times a day (QID) | ORAL | 0 refills | Status: DC | PRN
Start: 2023-07-15 — End: 2023-08-12

## 2023-07-15 NOTE — Telephone Encounter (Signed)
Pt is requesting a call back .Marland Kitchen She stated she has stage 3 kidney  but in all the years she never asked if it was both .Marland Kitchen So pt is wanting to know if it is both her kidneys

## 2023-07-15 NOTE — Telephone Encounter (Signed)
Patient called clinic concerned about her CKD stage III and had questions.  I called the patient back.  She wanted to know if the CKD was secondary to dysfunction 1 or both kidneys.  I explained to her that the renal function we looked at was a reflection of both her kidneys and that it reflected that her CKD was in stage III and that is likely secondary to her chronic medical issues including hypertension.  She was very grateful for this information.  She was also concerned about baclofen affecting her kidneys.  I explained to the patient that she is on a low-dose of baclofen and that this should not affect her kidneys.  I encouraged her to take it for her spasticity.  I also encouraged her to discuss her concerns with Dr. Mayford Knife at her next visit.  Patient expressed gratitude for the call.  All her questions were answered.  No further workup at this time

## 2023-07-15 NOTE — Telephone Encounter (Signed)
Return pt's call - stated she has Stage III kidney disease, she wants to know if it's both or one kidney. Also stated she's on a muscle relaxer (Baclofen) she wants to know will it damage her kidneys? Thanks

## 2023-08-11 ENCOUNTER — Other Ambulatory Visit: Payer: Self-pay | Admitting: Student

## 2023-08-11 DIAGNOSIS — G809 Cerebral palsy, unspecified: Secondary | ICD-10-CM

## 2023-08-12 ENCOUNTER — Other Ambulatory Visit: Payer: Self-pay

## 2023-08-12 DIAGNOSIS — G894 Chronic pain syndrome: Secondary | ICD-10-CM

## 2023-08-12 MED ORDER — HYDROCODONE-ACETAMINOPHEN 5-325 MG PO TABS
1.0000 | ORAL_TABLET | Freq: Four times a day (QID) | ORAL | 0 refills | Status: DC | PRN
Start: 2023-08-12 — End: 2023-09-14

## 2023-08-24 ENCOUNTER — Telehealth: Payer: Self-pay

## 2023-08-24 NOTE — Telephone Encounter (Signed)
RTC to patient to see if she is currently followed by a [facility] Nurse and what labs does she need.  Message left that the Clinics had called.

## 2023-08-24 NOTE — Telephone Encounter (Signed)
Requesting to speak with nurse about getting a nurse to come to her house to get blood work. Pt states she cannot come to the clinic because she's unable to walk. Please call pt back.

## 2023-08-24 NOTE — Telephone Encounter (Signed)
Call to patient currently does not have HH coming to her home.  States does not need a nurse to come out to do anything for her .  Patient stated would keep appointment in December and then go from there.

## 2023-08-31 ENCOUNTER — Telehealth: Payer: Self-pay

## 2023-08-31 NOTE — Telephone Encounter (Signed)
Pt stated she has stopped her ( Baclofen 5 MG TABS ) she stated that she saw it causes kidney  prob and she has stage 3 kidney and she is not taking it .... When asked when was the last time  she  took  the med she stated  3 wks ago

## 2023-09-02 NOTE — Telephone Encounter (Signed)
Talked to [facility] and her sister Toney Reil about their concerns about kidney damage from baclofen that they read on the internet.  I reassured them that while baclofen must be dosed carefully in people with CKD, the medicine itself doesn't cause kidney damage.  She agrees to give it a try in effort to calm her very significant spasm discomfort from CP.  If ineffective, we'll stop it.

## 2023-09-12 ENCOUNTER — Other Ambulatory Visit: Payer: Self-pay

## 2023-09-12 ENCOUNTER — Emergency Department (HOSPITAL_COMMUNITY)
Admission: EM | Admit: 2023-09-12 | Discharge: 2023-09-13 | Disposition: A | Discharge status: Home / Self Care | Payer: 59 | Attending: Emergency Medicine | Admitting: Emergency Medicine

## 2023-09-12 DIAGNOSIS — T148XXA Other injury of unspecified body region, initial encounter: Secondary | ICD-10-CM

## 2023-09-12 DIAGNOSIS — X58XXXA Exposure to other specified factors, initial encounter: Secondary | ICD-10-CM | POA: Insufficient documentation

## 2023-09-12 DIAGNOSIS — S80812A Abrasion, left lower leg, initial encounter: Secondary | ICD-10-CM | POA: Diagnosis present

## 2023-09-12 NOTE — ED Triage Notes (Signed)
Pt BIB [facility] from home, pt had a little red bump on her left lower leg for 4 days that became an abrasion after she scratched it.  Pt has a history of cellulitis 30 years ago and pt voiced concern that it may be cellulitis. Pain 0/10

## 2023-09-12 NOTE — ED Provider Notes (Signed)
[place] EMERGENCY DEPARTMENT AT [facility] Provider Note   CSN: [id number] Arrival date & time: 09/12/23  2132     History  Chief Complaint  Patient presents with   Abrasion    Kathy Owens is a 67 y.o. female who presents emergency department for evaluation of an abrasion to the left lower extremity.  Patient states that she was scratching her leg and she noticed that there was a red spot on it today.  She was afraid she might get cellulitis because she had it many years ago.  She has no increased pain heat redness or discharge.  She denies fevers.  She has had a little bit of swelling on that leg which is chronic for her.  She has no other complaints at this time  HPI     Home Medications Prior to Admission medications   Medication Sig Start Date End Date Taking? Authorizing Provider  alendronate (FOSAMAX) 70 MG tablet Take 1 tablet (70 mg total) by mouth every 7 (seven) days. Take with a full glass of water on an empty stomach. 04/16/23 04/15/24  Miguel Aschoff, MD  atorvastatin (LIPITOR) 10 MG tablet TAKE 1 TABLET(10 MG) BY MOUTH DAILY 04/21/23   Miguel Aschoff, MD  Baclofen 5 MG TABS TAKE 1 TABLET(5 MG) BY MOUTH THREE TIMES DAILY AS NEEDED FOR MUSCLE PAIN 08/11/23   Miguel Aschoff, MD  diclofenac Sodium (VOLTAREN) 1 % GEL Apply 2 g topically 4 (four) times daily as needed. 02/01/21   Miguel Aschoff, MD  HYDROcodone-acetaminophen (NORCO/VICODIN) 5-325 MG tablet Take 1 tablet by mouth every 6 (six) hours as needed for moderate pain (pain score 4-6) or severe pain (pain score 7-10). 08/12/23 09/11/23  Miguel Aschoff, MD  loperamide (IMODIUM) 2 MG capsule Take 1 capsule (2 mg total) by mouth daily as needed for up to 360 doses for diarrhea or loose stools. 05/25/23   Morene Crocker, MD  triamterene-hydrochlorothiazide (MAXZIDE-25) 37.5-25 MG tablet Take 1 tablet by mouth daily. 05/25/23   Morene Crocker, MD  vitamin D3  (CHOLECALCIFEROL) 25 MCG tablet Take 1 tablet (1,000 Units total) by mouth daily. 05/15/23   Miguel Aschoff, MD      Allergies    Vancomycin and Morphine    Review of Systems   Review of Systems  Physical Exam Updated Vital Signs BP 98/85 (BP Location: Right Arm)   Pulse 63   Temp 98 F (36.7 C) (Oral)   Resp 18   Ht 5' (1.524 m)   Wt 70.3 kg   SpO2 94%   BMI 30.27 kg/m  Physical Exam Vitals and nursing note reviewed.  Constitutional:      General: She is not in acute distress.    Appearance: She is well-developed. She is not diaphoretic.  HENT:     Head: Normocephalic and atraumatic.     Right Ear: External ear normal.     Left Ear: External ear normal.     Nose: Nose normal.     Mouth/Throat:     Mouth: Mucous membranes are moist.  Eyes:     General: No scleral icterus.    Conjunctiva/sclera: Conjunctivae normal.  Cardiovascular:     Rate and Rhythm: Normal rate and regular rhythm.     Heart sounds: Normal heart sounds. No murmur heard.    No friction rub. No gallop.  Pulmonary:     Effort: Pulmonary effort is normal. No respiratory distress.     Breath sounds:  Normal breath sounds.  Abdominal:     General: Bowel sounds are normal. There is no distension.     Palpations: Abdomen is soft. There is no mass.     Tenderness: There is no abdominal tenderness. There is no guarding.  Musculoskeletal:     Cervical back: Normal range of motion.  Skin:    General: Skin is warm and dry.     Comments: Small circular abrasion to the Left anterior shin. No evidence of cellulitis  Neurological:     Mental Status: She is alert and oriented to person, place, and time.  Psychiatric:        Behavior: Behavior normal.     ED Results / Procedures / Treatments   Labs (all labs ordered are listed, but only abnormal results are displayed) Labs Reviewed - No data to display  EKG None  Radiology No results found.  Procedures Procedures    Medications Ordered in  ED Medications - No data to display  ED Course/ Medical Decision Making/ A&P                                 Medical Decision Making 67 year old female with history of cerebral palsy.  Patient lives alone.  She has a small abrasion to the anterior left shin.  No evidence of cellulitis.  Will discharge patient with wound care precautions.  She appears otherwise appropriate for discharge without any evidence of skin and soft tissue infection cellulitis or other emergent finding.           Final Clinical Impression(s) / ED Diagnoses Final diagnoses:  None    Rx / DC Orders ED Discharge Orders     None         Arthor Captain, PA-C 09/12/23 2305    Wynetta Fines, MD 09/12/23 [id number]

## 2023-09-12 NOTE — Discharge Instructions (Signed)
Contact a health care provider if: You got a tetanus shot, and you have swelling, severe pain, redness, or bleeding at the site of your shot. Your pain is worse and medicines do not help. You have a fever. You have any of signs of infection. Get help right away if: You have a red streak spreading away from your wound.

## 2023-09-13 NOTE — ED Notes (Signed)
Ptar called 

## 2023-09-14 ENCOUNTER — Other Ambulatory Visit: Payer: Self-pay

## 2023-09-14 DIAGNOSIS — G894 Chronic pain syndrome: Secondary | ICD-10-CM

## 2023-09-15 MED ORDER — HYDROCODONE-ACETAMINOPHEN 5-325 MG PO TABS
1.0000 | ORAL_TABLET | Freq: Four times a day (QID) | ORAL | 0 refills | Status: DC | PRN
Start: 2023-09-15 — End: 2023-10-16

## 2023-09-21 ENCOUNTER — Telehealth: Payer: Self-pay | Admitting: *Deleted

## 2023-09-21 NOTE — Telephone Encounter (Signed)
Call from patient states that the Muscle Relaxant that she is on is not working.  Patient states that the Baclofen has never really helped at the present dose and would like to get something else if possible.  Would like to get a call from Dr. Mayford Knife if possible to discuss.

## 2023-09-22 ENCOUNTER — Encounter: Payer: 59 | Admitting: Student

## 2023-09-30 NOTE — Telephone Encounter (Signed)
Call from patient said that the 5 mg of the Baclofen is not helping.  Would like to speak with Dr. Mayford Knife about getting something else.

## 2023-10-05 NOTE — Telephone Encounter (Signed)
Patient called regarding the previous message, patient stated she fell on Friday and blacked both of her eyes. Patient is requesting a call back.

## 2023-10-07 NOTE — Telephone Encounter (Signed)
Call from pt again who stated she has been calling since 12/2 requesting a call back from her PCP to discuss her muscle relaxant ; she has not received a call back. Pt stated the muscle relaxant is not working and she would like something else.

## 2023-10-16 ENCOUNTER — Other Ambulatory Visit: Payer: Self-pay

## 2023-10-16 DIAGNOSIS — G894 Chronic pain syndrome: Secondary | ICD-10-CM

## 2023-10-16 MED ORDER — HYDROCODONE-ACETAMINOPHEN 5-325 MG PO TABS
1.0000 | ORAL_TABLET | Freq: Four times a day (QID) | ORAL | 0 refills | Status: DC | PRN
Start: 2023-10-16 — End: 2023-11-12

## 2023-10-16 NOTE — Telephone Encounter (Signed)
Patient called requesting a rx refill. Patient stated she is still having muscle spasms and would like a call back to discuss further. Please return patients call.

## 2023-10-28 ENCOUNTER — Encounter: Payer: Self-pay | Admitting: Internal Medicine

## 2023-10-28 ENCOUNTER — Ambulatory Visit: Payer: 59 | Admitting: Internal Medicine

## 2023-10-28 VITALS — BP 139/60 | HR 75 | Temp 98.0°F | Ht 60.0 in

## 2023-10-28 DIAGNOSIS — G809 Cerebral palsy, unspecified: Secondary | ICD-10-CM

## 2023-10-28 DIAGNOSIS — Z23 Encounter for immunization: Secondary | ICD-10-CM | POA: Diagnosis not present

## 2023-10-28 MED ORDER — BACLOFEN 10 MG PO TABS: 10.0000 mg | ORAL_TABLET | Freq: Three times a day (TID) | ORAL | 1 refills | Status: DC | PRN

## 2023-10-28 NOTE — Patient Instructions (Signed)
 Kathy Owens,   It was a pleasure to meet you.   I am prescribing Baclofen  10mg . You can take this up to three times per day. If it is making you too groggy, please take it less frequently.   I am also referring you to physical medicine and rehab. They specialize in muscle spasticity and difficulty moving.   Thanks,  Dr Francella

## 2023-10-28 NOTE — Assessment & Plan Note (Addendum)
 Patient reports increasing muscle stiffness and spasticity over the last several months. She reports some increased difficulty walking and gripping things with her hands. She hs been prescribed baclofen  5mg , which she does not feel is as effective as it used to be. Requesting increase in dosage.  - Increase to Baclofen  10mg  up to three times daily prn. Warned about risk of sedation and recommended slowly increasing from 5mg  dosage as tolerated - Referral to PM&R - BMP today
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
## 2023-10-29 ENCOUNTER — Encounter: Payer: Self-pay | Admitting: Internal Medicine

## 2023-10-29 LAB — BASIC METABOLIC PANEL
BUN/Creatinine Ratio: 35 — ABNORMAL HIGH (ref 12–28)
BUN: 35 mg/dL — ABNORMAL HIGH (ref 8–27)
CO2: 20 mmol/L (ref 20–29)
Calcium: 9.7 mg/dL (ref 8.7–10.3)
Chloride: 105 mmol/L (ref 96–106)
Creatinine, Ser: 1.01 mg/dL — ABNORMAL HIGH (ref 0.57–1.00)
Glucose: 97 mg/dL (ref 70–99)
Potassium: 3.7 mmol/L (ref 3.5–5.2)
Sodium: 143 mmol/L (ref 134–144)
eGFR: 61 mL/min/{1.73_m2} (ref >59)

## 2023-10-29 NOTE — Progress Notes (Signed)
 Internal Medicine Clinic Attending  Case discussed with the resident at the time of the visit.  We reviewed the resident's history and exam and pertinent patient test results.  I agree with the assessment, diagnosis, and plan of care documented in the resident's note.

## 2023-11-02 ENCOUNTER — Telehealth: Payer: Self-pay

## 2023-11-02 NOTE — Telephone Encounter (Signed)
 Pt is requesting a call back ... She is wanting to know if her test results from her last OV

## 2023-11-02 NOTE — Telephone Encounter (Signed)
 Pt saw Dr Carlynn Purl on 10/28/23.

## 2023-11-07 ENCOUNTER — Emergency Department (HOSPITAL_COMMUNITY)
Admission: EM | Admit: 2023-11-07 | Discharge: 2023-11-08 | Disposition: A | Discharge status: Home / Self Care | Payer: 59 | Attending: Emergency Medicine | Admitting: Emergency Medicine

## 2023-11-07 ENCOUNTER — Other Ambulatory Visit: Payer: Self-pay

## 2023-11-07 ENCOUNTER — Encounter (HOSPITAL_COMMUNITY): Payer: Self-pay

## 2023-11-07 ENCOUNTER — Emergency Department (HOSPITAL_COMMUNITY): Payer: 59

## 2023-11-07 DIAGNOSIS — R2689 Other abnormalities of gait and mobility: Secondary | ICD-10-CM | POA: Insufficient documentation

## 2023-11-07 DIAGNOSIS — R269 Unspecified abnormalities of gait and mobility: Secondary | ICD-10-CM

## 2023-11-07 DIAGNOSIS — R7989 Other specified abnormal findings of blood chemistry: Secondary | ICD-10-CM | POA: Diagnosis not present

## 2023-11-07 DIAGNOSIS — M79671 Pain in right foot: Secondary | ICD-10-CM | POA: Diagnosis not present

## 2023-11-07 DIAGNOSIS — M85872 Other specified disorders of bone density and structure, left ankle and foot: Secondary | ICD-10-CM | POA: Diagnosis not present

## 2023-11-07 DIAGNOSIS — Z743 Need for continuous supervision: Secondary | ICD-10-CM | POA: Diagnosis not present

## 2023-11-07 DIAGNOSIS — M2141 Flat foot [pes planus] (acquired), right foot: Secondary | ICD-10-CM | POA: Diagnosis not present

## 2023-11-07 DIAGNOSIS — I6782 Cerebral ischemia: Secondary | ICD-10-CM | POA: Diagnosis not present

## 2023-11-07 DIAGNOSIS — M19072 Primary osteoarthritis, left ankle and foot: Secondary | ICD-10-CM | POA: Diagnosis not present

## 2023-11-07 DIAGNOSIS — R29818 Other symptoms and signs involving the nervous system: Secondary | ICD-10-CM | POA: Diagnosis not present

## 2023-11-07 DIAGNOSIS — M85871 Other specified disorders of bone density and structure, right ankle and foot: Secondary | ICD-10-CM | POA: Diagnosis not present

## 2023-11-07 DIAGNOSIS — I639 Cerebral infarction, unspecified: Secondary | ICD-10-CM | POA: Diagnosis not present

## 2023-11-07 DIAGNOSIS — M19071 Primary osteoarthritis, right ankle and foot: Secondary | ICD-10-CM | POA: Diagnosis not present

## 2023-11-07 DIAGNOSIS — R262 Difficulty in walking, not elsewhere classified: Secondary | ICD-10-CM | POA: Diagnosis not present

## 2023-11-07 DIAGNOSIS — M79672 Pain in left foot: Secondary | ICD-10-CM | POA: Diagnosis not present

## 2023-11-07 LAB — CBC WITH DIFFERENTIAL/PLATELET
Abs Immature Granulocytes: 0.03 10*3/uL (ref 0.00–0.07)
Basophils Absolute: 0 10*3/uL (ref 0.0–0.1)
Basophils Relative: 0 %
Eosinophils Absolute: 0.2 10*3/uL (ref 0.0–0.5)
Eosinophils Relative: 2 %
HCT: 42.1 % (ref 36.0–46.0)
Hemoglobin: 14 g/dL (ref 12.0–15.0)
Immature Granulocytes: 0 %
Lymphocytes Relative: 21 %
Lymphs Abs: 1.6 10*3/uL (ref 0.7–4.0)
MCH: 29.2 pg (ref 26.0–34.0)
MCHC: 33.3 g/dL (ref 30.0–36.0)
MCV: 87.9 fL (ref 80.0–100.0)
Monocytes Absolute: 0.6 10*3/uL (ref 0.1–1.0)
Monocytes Relative: 7 %
Neutro Abs: 5.2 10*3/uL (ref 1.7–7.7)
Neutrophils Relative %: 70 %
Platelets: 245 10*3/uL (ref 150–400)
RBC: 4.79 MIL/uL (ref 3.87–5.11)
RDW: 13.6 % (ref 11.5–15.5)
WBC: 7.6 10*3/uL (ref 4.0–10.5)
nRBC: 0 % (ref 0.0–0.2)

## 2023-11-07 LAB — BASIC METABOLIC PANEL
Anion gap: 15 (ref 5–15)
BUN: 37 mg/dL — ABNORMAL HIGH (ref 8–23)
CO2: 22 mmol/L (ref 22–32)
Calcium: 9.6 mg/dL (ref 8.9–10.3)
Chloride: 101 mmol/L (ref 98–111)
Creatinine, Ser: 1.32 mg/dL — ABNORMAL HIGH (ref 0.44–1.00)
GFR, Estimated: 44 mL/min — ABNORMAL LOW (ref >60)
Glucose, Bld: 103 mg/dL — ABNORMAL HIGH (ref 70–99)
Potassium: 3.4 mmol/L — ABNORMAL LOW (ref 3.5–5.1)
Sodium: 138 mmol/L (ref 135–145)

## 2023-11-07 LAB — MAGNESIUM: Magnesium: 2 mg/dL (ref 1.7–2.4)

## 2023-11-07 MED ORDER — ACETAMINOPHEN 325 MG PO TABS
650.0000 mg | ORAL_TABLET | Freq: Once | ORAL | Status: AC
  Administered 2023-11-07: 650 mg via ORAL
  Filled 2023-11-07: qty 2

## 2023-11-07 MED ORDER — HYDROCODONE-ACETAMINOPHEN 5-325 MG PO TABS: 1.0000 | ORAL_TABLET | Freq: Once | ORAL | Status: DC

## 2023-11-07 NOTE — ED Notes (Signed)
[facility] setup for pt transport.

## 2023-11-07 NOTE — Discharge Instructions (Addendum)
You were seen in the emergency department for increased difficulty moving your feet and walking since a fall a few weeks ago.  X-rays of your feet did not show any fractures and your CAT scan did not show a new stroke.  Your lab work was unremarkable.  Please schedule a follow-up appointment with your primary care doctor to see if they can adjust your spasm medication or set you up with physical therapy.

## 2023-11-07 NOTE — ED Triage Notes (Signed)
PER EMS: pt is from home with c/o numbness to bilateral feet x 1 week, hx of CP and has clubbed feet. A&Ox4, speech clear and face is symmetrical.   BP- 142/82, HR-84, RR-16

## 2023-11-07 NOTE — ED Provider Notes (Signed)
[place] EMERGENCY DEPARTMENT AT [facility] Provider Note   CSN: [id number] Arrival date & time: 11/07/23  1908     History {Add pertinent medical, surgical, social history, OB history to HPI:1} Chief Complaint  Patient presents with   Numbness    Kathy Owens is a 68 y.o. female.  She has a history of cerebral palsy with lower extremity spasticity.  She said she has some baseline difficulty walking and uses a walker.  She had a fall a few weeks ago hit her head.  Since then she has had more difficulty walking especially with her right leg.  She said she feels like her foot is sticking to the floor and she cannot lift it up as well.  The triage notes that she is noticing numbness but she said it is not really numb it is just sticking to the floor.  Saw her primary care doctor for this but she said they have not really done anything for it so far.  No chest pain or shortness of breath fevers chills.  The history is provided by the patient.       Home Medications Prior to Admission medications   Medication Sig Start Date End Date Taking? Authorizing Provider  alendronate (FOSAMAX) 70 MG tablet Take 1 tablet (70 mg total) by mouth every 7 (seven) days. Take with a full glass of water on an empty stomach. 04/16/23 04/15/24  Miguel Aschoff, MD  atorvastatin (LIPITOR) 10 MG tablet TAKE 1 TABLET(10 MG) BY MOUTH DAILY 04/21/23   Miguel Aschoff, MD  baclofen (LIORESAL) 10 MG tablet Take 1 tablet (10 mg total) by mouth 3 (three) times daily as needed for muscle spasms. 10/28/23 10/27/24  Monna Fam, MD  diclofenac Sodium (VOLTAREN) 1 % GEL Apply 2 g topically 4 (four) times daily as needed. 02/01/21   Miguel Aschoff, MD  HYDROcodone-acetaminophen (NORCO/VICODIN) 5-325 MG tablet Take 1 tablet by mouth every 6 (six) hours as needed for moderate pain (pain score 4-6) or severe pain (pain score 7-10). 10/16/23 11/15/23  Miguel Aschoff, MD  loperamide (IMODIUM) 2 MG  capsule Take 1 capsule (2 mg total) by mouth daily as needed for up to 360 doses for diarrhea or loose stools. 05/25/23   Morene Crocker, MD  triamterene-hydrochlorothiazide (MAXZIDE-25) 37.5-25 MG tablet Take 1 tablet by mouth daily. 05/25/23   Morene Crocker, MD  vitamin D3 (CHOLECALCIFEROL) 25 MCG tablet Take 1 tablet (1,000 Units total) by mouth daily. 05/15/23   Miguel Aschoff, MD      Allergies    Vancomycin and Morphine    Review of Systems   Review of Systems  Constitutional:  Negative for fever.  HENT:  Negative for sore throat.   Respiratory:  Negative for shortness of breath.   Cardiovascular:  Negative for chest pain.  Gastrointestinal:  Negative for abdominal pain.  Genitourinary:  Negative for dysuria.  Musculoskeletal:  Positive for gait problem.    Physical Exam Updated Vital Signs BP 136/73   Pulse 75   Temp 98.2 F (36.8 C)   Resp 16   Ht 5' (1.524 m)   Wt 70.3 kg   SpO2 97%   BMI 30.27 kg/m  Physical Exam Vitals and nursing note reviewed.  Constitutional:      General: She is not in acute distress.    Appearance: Normal appearance. She is well-developed.  HENT:     Head: Normocephalic and atraumatic.  Eyes:     Conjunctiva/sclera:  Conjunctivae normal.  Cardiovascular:     Rate and Rhythm: Normal rate and regular rhythm.     Heart sounds: No murmur heard. Pulmonary:     Effort: Pulmonary effort is normal. No respiratory distress.     Breath sounds: Normal breath sounds.  Abdominal:     Palpations: Abdomen is soft.     Tenderness: There is no abdominal tenderness. There is no guarding or rebound.  Musculoskeletal:        General: No tenderness.     Cervical back: Neck supple.  Skin:    General: Skin is warm and dry.     Capillary Refill: Capillary refill takes less than 2 seconds.  Neurological:     Mental Status: She is alert. Mental status is at baseline.     Comments: She is awake and alert.  She has some limited range of  motion of her bilateral lower extremities in the ankles and feet.  There is no gross deformities.  She has sensation to light touch.  Distal pulses are intact.     ED Results / Procedures / Treatments   Labs (all labs ordered are listed, but only abnormal results are displayed) Labs Reviewed  CBC WITH DIFFERENTIAL/PLATELET  BASIC METABOLIC PANEL  MAGNESIUM    EKG None  Radiology No results found.  Procedures Procedures  {Document cardiac monitor, telemetry assessment procedure when appropriate:1}  Medications Ordered in ED Medications  acetaminophen (TYLENOL) tablet 650 mg (650 mg Oral Given 11/07/23 2025)    ED Course/ Medical Decision Making/ A&P   {   Click here for ABCD2, HEART and other calculatorsREFRESH Note before signing :1}                              Medical Decision Making Amount and/or Complexity of Data Reviewed Radiology: ordered.   This patient complains of ***; this involves an extensive number of treatment Options and is a complaint that carries with it a high risk of complications and morbidity. The differential includes ***  I ordered, reviewed and interpreted labs, which included *** I ordered medication *** and reviewed PMP when indicated. I ordered imaging studies which included *** and I independently    visualized and interpreted imaging which showed *** Additional history obtained from *** Previous records obtained and reviewed *** I consulted *** and discussed lab and imaging findings and discussed disposition.  Cardiac monitoring reviewed, *** Social determinants considered, *** Critical Interventions: ***  After the interventions stated above, I reevaluated the patient and found *** Admission and further testing considered, ***   {Document critical care time when appropriate:1} {Document review of labs and clinical decision tools ie heart score, Chads2Vasc2 etc:1}  {Document your independent review of radiology images, and any  outside records:1} {Document your discussion with family members, caretakers, and with consultants:1} {Document social determinants of health affecting pt's care:1} {Document your decision making why or why not admission, treatments were needed:1} Final Clinical Impression(s) / ED Diagnoses Final diagnoses:  None    Rx / DC Orders ED Discharge Orders     None

## 2023-11-07 NOTE — ED Provider Triage Note (Signed)
Emergency Medicine Provider Triage Evaluation Note  Kathy Owens , a 68 y.o. female  was evaluated in triage.  Pt complains of bilateral plantar foot pain with intermittent paresthesia for past week.  She states that it "feels like my feet stick to the ground".  She was seen by PCP on 10/28/2023 for similar symptoms and increased her dose of baclofen.  However, it made her too groggy so decreased back down to 5 mg.  PCP ordered referral for physical medicine and rehab but she has not received a call or schedule an appointment.  Normally walks with walker  Review of Systems  Positive: bilateral plantar foot pain with intermittent paresthesia Negative: trauma  Physical Exam  BP 136/73   Pulse 75   Temp 98.2 F (36.8 C)   Resp 16   Ht 5' (1.524 m)   Wt 70.3 kg   SpO2 97%   BMI 30.27 kg/m  Gen:   Awake, no distress   Resp:  Normal effort  MSK:   Moves extremities without difficulty Other:  No break in skin integrity nor overlying skin changes of ecchymosis, warmth, erythema   Medical Decision Making  Medically screening exam initiated at 8:17 PM.  Appropriate orders placed.  Kathy Owens was informed that the remainder of the evaluation will be completed by another provider, this initial triage assessment does not replace that evaluation, and the importance of remaining in the ED until their evaluation is complete.  Tylenol ordered for pain   Judithann Sheen, PA 11/07/23 2025

## 2023-11-08 DIAGNOSIS — R262 Difficulty in walking, not elsewhere classified: Secondary | ICD-10-CM | POA: Diagnosis not present

## 2023-11-08 DIAGNOSIS — Z743 Need for continuous supervision: Secondary | ICD-10-CM | POA: Diagnosis not present

## 2023-11-08 NOTE — ED Notes (Signed)
[facility] arrived for pt  

## 2023-11-09 ENCOUNTER — Encounter: Payer: Self-pay | Admitting: Physical Medicine and Rehabilitation

## 2023-11-12 ENCOUNTER — Other Ambulatory Visit: Payer: Self-pay

## 2023-11-12 DIAGNOSIS — G894 Chronic pain syndrome: Secondary | ICD-10-CM

## 2023-11-12 MED ORDER — HYDROCODONE-ACETAMINOPHEN 5-325 MG PO TABS: 1.0000 | ORAL_TABLET | Freq: Four times a day (QID) | ORAL | 0 refills | Status: DC | PRN

## 2023-11-18 ENCOUNTER — Other Ambulatory Visit: Payer: Self-pay

## 2023-11-18 DIAGNOSIS — G809 Cerebral palsy, unspecified: Secondary | ICD-10-CM

## 2023-11-18 MED ORDER — BACLOFEN 5 MG PO TABS: ORAL_TABLET | ORAL | 5 refills | Status: DC

## 2023-11-18 NOTE — Telephone Encounter (Signed)
Patient called she stated she was prescribed baclofen 10 mg, patient stated she was taking the medication 3 times a day as instructed and it was too much for her. Patient is requesting baclofen 5mg  from Dr.Williams

## 2023-12-14 ENCOUNTER — Other Ambulatory Visit: Payer: Self-pay

## 2023-12-14 DIAGNOSIS — G894 Chronic pain syndrome: Secondary | ICD-10-CM

## 2023-12-14 MED ORDER — HYDROCODONE-ACETAMINOPHEN 5-325 MG PO TABS: 1.0000 | ORAL_TABLET | Freq: Four times a day (QID) | ORAL | 0 refills | Status: DC | PRN

## 2024-01-13 ENCOUNTER — Other Ambulatory Visit: Payer: Self-pay | Admitting: Internal Medicine

## 2024-01-13 DIAGNOSIS — G894 Chronic pain syndrome: Secondary | ICD-10-CM

## 2024-01-13 NOTE — Telephone Encounter (Signed)
 Copied from CRM [id number]. Topic: Clinical - Medication Refill >> Jan 13, 2024  1:46 PM Thomes Dinning wrote: Most Recent Primary Care Visit:  Provider: Monna Fam  Department: [facility]  Visit Type: OPEN ESTABLISHED  Date: 10/28/2023  Medication: HYDROcodone-acetaminophen (NORCO/VICODIN) 5-325 MG tablet   Has the patient contacted their pharmacy? No (Agent: If no, request that the patient contact the pharmacy for the refill. If patient does not wish to contact the pharmacy document the reason why and proceed with request.) (Agent: If yes, when and what did the pharmacy advise?)  Is this the correct pharmacy for this prescription? Yes If no, delete pharmacy and type the correct one.  This is the patient's preferred pharmacy:  [id number] - [place], [place] - [address] AT [facility] OF [address] [place] [address] Phone: [phone] Fax: [phone]   Has the prescription been filled recently? No  Is the patient out of the medication? Yes  Has the patient been seen for an appointment in the last year OR does the patient have an upcoming appointment? No  Can we respond through MyChart? Yes  Agent: Please be advised that Rx refills may take up to 3 business days. We ask that you follow-up with your pharmacy.

## 2024-01-14 MED ORDER — HYDROCODONE-ACETAMINOPHEN 5-325 MG PO TABS: 1.0000 | ORAL_TABLET | Freq: Four times a day (QID) | ORAL | 0 refills | Status: DC | PRN

## 2024-01-21 ENCOUNTER — Ambulatory Visit: Payer: Self-pay

## 2024-01-21 NOTE — Telephone Encounter (Signed)
  Chief Complaint: Inquiry  Symptoms: None Frequency: N/A Pertinent Negatives: Patient denies all symptoms Disposition: [] ED /[] Urgent Care (no appt availability in office) / [] Appointment(In office/virtual)/ []  [facility]/ [x] Home Care/ [] Refused Recommended Disposition /[] [place]/ []  Follow-up with PCP Additional Notes:  Wondering if she is pre-diabetic. Asks if her latest blood work shows if she is pre-diabetic or not. She recalls Dr. Mayford Knife told her in the past that her sugar was "perfect". I did let her know that her glucose on 11/07/23 was slightly elevated, but reassuring she is not symptomatic. Ele[employer]ra is inquiring why her sister is pre-diabetic while she is not.  Patient is not experiencing any symptoms or illnesses and is just curious to know if she will become pre-diabetic. Briefly explained that everyone's body process things in it own unique way and that because her sister is pre-diabetic it does not mean that she will also developed prediabetes, encouraged healthy diet and to call back with any further questions. Caller has no additional questions at this time.   Copied from CRM [id number]. Topic: Clinical - Medical Advice >> Jan 21, 2024  1:56 PM Hamdi H wrote: Reason for CRM: Patient called wanting to know if she's prediabetic based on her old lab results. Please give the patient a call back to discuss this, I was unable to find that information on her chart. Reason for Disposition . [1] Other NON-URGENT information for PCP AND [2] does not require PCP response  Protocols used: PCP Call - No Triage-A-AH

## 2024-01-25 ENCOUNTER — Encounter: Payer: 59 | Attending: Physical Medicine and Rehabilitation | Admitting: Physical Medicine and Rehabilitation

## 2024-01-25 ENCOUNTER — Encounter: Payer: Self-pay | Admitting: Physical Medicine and Rehabilitation

## 2024-01-25 VITALS — BP 116/64 | HR 55

## 2024-01-25 DIAGNOSIS — R252 Cramp and spasm: Secondary | ICD-10-CM | POA: Diagnosis not present

## 2024-01-25 DIAGNOSIS — G809 Cerebral palsy, unspecified: Secondary | ICD-10-CM

## 2024-01-25 MED ORDER — BACLOFEN 5 MG PO TABS: ORAL_TABLET | ORAL | 5 refills | Status: DC
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
## 2024-01-25 NOTE — Patient Instructions (Addendum)
 Pt is a 68 yr old female with hx of quadriplegic spastic Cerebral palsy- also has: HLD; HTN; Osteoporosis- and chronic diarrhea; Also has CKD3 kidney disease;  Here for evaluation for Cerebral palsy-  Also has bone spurs in R shoulder      [employer]   B/L shoes for walking- needs due severe changes in foot anatomy from Quadriplegic CP- over supination and overpronation. Needs AFO's- but pt doesn't wants AFO's-leave room for them- call me if decides on them.  Difficulty getting shoes on and off, so needs easy way to do so- velcro?  2. Have Dr Shearon Stalls see pt to discuss Botox of ankles/feet for forming contractures.     3. Home Health- agency- for PT - cannot do outpt PT-  Since has a decline in function-  and less ability to walk.  Now walking no more than 25 ft at a time- max 100 ft.    4.  Continue Baclofen 5 mg  in AM and middle of day and10 mg nightly- - 4-8 hours apart-  so don't let it go past 8 hours for a dose. Hard to get middle of day dose to take meds- set an alarm on med box- or a traditional alarm- I would program an alarm   5. Doesn't want Carbon fiber AFOs- will wait for now. But would be ideal to help with gait.    6. Wait for w/c-   at this time per pt request.    7. Might benefit by Dantrolene- however has chronic diarrhea-and would get sedated on Zanaflex.    8. F/U in 3 months- for me- double appt- CP and Dr Shearon Stalls for Botox.   9. Most patients with Cerebral palsy have issues with aging- and this difficulty walking is the problem with this.  We might have to go to a wheelchair long term, but will try to get her walking.

## 2024-02-03 ENCOUNTER — Emergency Department (HOSPITAL_COMMUNITY)
Admission: EM | Admit: 2024-02-03 | Discharge: 2024-02-04 | Disposition: A | Discharge status: Home / Self Care | Attending: Emergency Medicine | Admitting: Emergency Medicine

## 2024-02-03 ENCOUNTER — Encounter (HOSPITAL_COMMUNITY): Payer: Self-pay

## 2024-02-03 ENCOUNTER — Other Ambulatory Visit: Payer: Self-pay

## 2024-02-03 DIAGNOSIS — R531 Weakness: Secondary | ICD-10-CM

## 2024-02-03 DIAGNOSIS — Z79899 Other long term (current) drug therapy: Secondary | ICD-10-CM | POA: Insufficient documentation

## 2024-02-03 DIAGNOSIS — R6889 Other general symptoms and signs: Secondary | ICD-10-CM | POA: Diagnosis not present

## 2024-02-03 DIAGNOSIS — G809 Cerebral palsy, unspecified: Secondary | ICD-10-CM

## 2024-02-03 DIAGNOSIS — N189 Chronic kidney disease, unspecified: Secondary | ICD-10-CM | POA: Insufficient documentation

## 2024-02-03 DIAGNOSIS — I129 Hypertensive chronic kidney disease with stage 1 through stage 4 chronic kidney disease, or unspecified chronic kidney disease: Secondary | ICD-10-CM | POA: Diagnosis not present

## 2024-02-03 DIAGNOSIS — Z743 Need for continuous supervision: Secondary | ICD-10-CM | POA: Diagnosis not present

## 2024-02-03 LAB — BASIC METABOLIC PANEL WITH GFR
Anion gap: 10 (ref 5–15)
BUN: 37 mg/dL — ABNORMAL HIGH (ref 8–23)
CO2: 25 mmol/L (ref 22–32)
Calcium: 9 mg/dL (ref 8.9–10.3)
Chloride: 104 mmol/L (ref 98–111)
Creatinine, Ser: 1.11 mg/dL — ABNORMAL HIGH (ref 0.44–1.00)
GFR, Estimated: 54 mL/min — ABNORMAL LOW (ref >60)
Glucose, Bld: 103 mg/dL — ABNORMAL HIGH (ref 70–99)
Potassium: 3.4 mmol/L — ABNORMAL LOW (ref 3.5–5.1)
Sodium: 139 mmol/L (ref 135–145)

## 2024-02-03 LAB — CBC WITH DIFFERENTIAL/PLATELET
Abs Immature Granulocytes: 0.01 10*3/uL (ref 0.00–0.07)
Basophils Absolute: 0 10*3/uL (ref 0.0–0.1)
Basophils Relative: 1 %
Eosinophils Absolute: 0.2 10*3/uL (ref 0.0–0.5)
Eosinophils Relative: 3 %
HCT: 40.5 % (ref 36.0–46.0)
Hemoglobin: 13.6 g/dL (ref 12.0–15.0)
Immature Granulocytes: 0 %
Lymphocytes Relative: 23 %
Lymphs Abs: 1.4 10*3/uL (ref 0.7–4.0)
MCH: 29.6 pg (ref 26.0–34.0)
MCHC: 33.6 g/dL (ref 30.0–36.0)
MCV: 88 fL (ref 80.0–100.0)
Monocytes Absolute: 0.4 10*3/uL (ref 0.1–1.0)
Monocytes Relative: 7 %
Neutro Abs: 4.1 10*3/uL (ref 1.7–7.7)
Neutrophils Relative %: 66 %
Platelets: 212 10*3/uL (ref 150–400)
RBC: 4.6 MIL/uL (ref 3.87–5.11)
RDW: 13.9 % (ref 11.5–15.5)
WBC: 6.2 10*3/uL (ref 4.0–10.5)
nRBC: 0 % (ref 0.0–0.2)

## 2024-02-03 LAB — RESP PANEL BY RT-PCR (RSV, FLU A&B, COVID)  RVPGX2
Influenza A by PCR: NEGATIVE
Influenza B by PCR: NEGATIVE
Resp Syncytial Virus by PCR: NEGATIVE
SARS Coronavirus 2 by RT PCR: NEGATIVE

## 2024-02-03 MED ORDER — VITAMIN B-12 1000 MCG PO TABS
1000.0000 ug | ORAL_TABLET | Freq: Every day | ORAL | Status: DC
  Administered 2024-02-04: 1000 ug via ORAL
  Filled 2024-02-03: qty 1

## 2024-02-03 MED ORDER — VITAMIN D 25 MCG (1000 UNIT) PO TABS
1000.0000 [IU] | ORAL_TABLET | Freq: Every day | ORAL | Status: DC
  Administered 2024-02-04: 1000 [IU] via ORAL
  Filled 2024-02-03: qty 1

## 2024-02-03 MED ORDER — ATORVASTATIN CALCIUM 10 MG PO TABS
10.0000 mg | ORAL_TABLET | Freq: Every day | ORAL | Status: DC
  Administered 2024-02-04: 10 mg via ORAL
  Filled 2024-02-03: qty 1

## 2024-02-03 MED ORDER — TRIAMTERENE-HCTZ 37.5-25 MG PO TABS
1.0000 | ORAL_TABLET | Freq: Every day | ORAL | Status: DC
  Administered 2024-02-04: 1 via ORAL
  Filled 2024-02-03 (×2): qty 1

## 2024-02-03 MED ORDER — DICLOFENAC SODIUM 1 % EX GEL: 2.0000 g | Freq: Four times a day (QID) | CUTANEOUS | Status: DC | PRN

## 2024-02-03 MED ORDER — BACLOFEN 10 MG PO TABS
5.0000 mg | ORAL_TABLET | Freq: Three times a day (TID) | ORAL | Status: DC
  Administered 2024-02-03 – 2024-02-04 (×3): 5 mg via ORAL
  Filled 2024-02-03 (×3): qty 1

## 2024-02-03 MED ORDER — LOPERAMIDE HCL 2 MG PO CAPS: 2.0000 mg | ORAL_CAPSULE | Freq: Every day | ORAL | Status: DC | PRN

## 2024-02-03 MED ORDER — ALENDRONATE SODIUM 70 MG PO TABS: 70.0000 mg | ORAL_TABLET | ORAL | Status: DC

## 2024-02-03 MED ORDER — HYDROCODONE-ACETAMINOPHEN 5-325 MG PO TABS: 1.0000 | ORAL_TABLET | Freq: Four times a day (QID) | ORAL | Status: DC | PRN

## 2024-02-03 NOTE — ED Triage Notes (Addendum)
 Pt BIBA from home, c/o weakness in BLE.  Last Monday her baclofen dose was doubled and she has been feeling that way every since.  Pt was informed by her pharmacist that it is normal but she just wanted to be seen just in case.  Denies pain. Also c/o blurry vision. Hx of kidney disease

## 2024-02-03 NOTE — BH Assessment (Addendum)
 TTS Note:   @ 9:34 PM, the patient was deferred to IRIS. The IRIS coordinator, Halima has received the request. The patient's care team has been updated on the disposition. The IRIS coordinator will notify the care team when the IRIS provider is ready to begin the patient's assessment. If there are any questions, the care team should contact the IRIS coordinator at [phone].   Update: @9 :50 PM, Daja, RN, the patient's nurse, noted, "The patient does not require a TTS consult; the provider has discontinued the orders." This clinician notified the IRIS Care Coordinator and requested the consult be canceled.

## 2024-02-03 NOTE — Telephone Encounter (Signed)
 Please re route this message as it states Dr. Raynaldo Call ordered this patient's [facility] order not her PCP Dr Broadus Canes.   Copied from CRM [id number]. Topic: General - Other >> Feb 02, 2024  1:12 PM Suzette B wrote: Reason for CRM: Patient stated that during her visit she discussed with the provider Dr. Lovorn about having Home Health Assistance with physical therapy as well as occupational therapy. Patient states she has not heard back from anyone so she wanted to call and check the status of home health care.

## 2024-02-03 NOTE — ED Provider Notes (Signed)
 [place] EMERGENCY DEPARTMENT AT [facility] Provider Note   CSN: [id number] Arrival date & time: 02/03/24  1920     History  Chief Complaint  Patient presents with   Weakness    In lower legs     Kathy Owens is a 68 y.o. female.  With a history of cerebral palsy, CKD, hypertension hyperlipidemia who presents to the ED for lower extremity weakness.  Patient was recently seen by her physical medicine specialist on April 7 (Dr.Lovorn) and her baclofen dose was increased at that time.  There was a plan to arrange for patient to have PT at home and a wheelchair ordered given her increased bilateral lower extremity weakness and difficulty with walking.  Patient states she has not had PT at home yet and does not yet have a wheelchair.  She has had great difficulty walking and performing activities of daily living.  Some increased generalized weakness after increasing the baclofen dose.  No falls, focal weakness or other complaints at this time.  Patient lives alone in an independent living facility.   Weakness      Home Medications Prior to Admission medications   Medication Sig Start Date End Date Taking? Authorizing Provider  alendronate (FOSAMAX) 70 MG tablet Take 1 tablet (70 mg total) by mouth every 7 (seven) days. Take with a full glass of water on an empty stomach. 04/16/23 04/15/24 Yes Miguel Aschoff, MD  atorvastatin (LIPITOR) 10 MG tablet TAKE 1 TABLET(10 MG) BY MOUTH DAILY 04/21/23  Yes Miguel Aschoff, MD  Baclofen 5 MG TABS 5 mg in AM , 5 mg in afternoon and 10 mg nightly- for spasms/tightness- at least 4 hours in between meds- no more than 8 hours. 01/25/24  Yes Lovorn, Aundra Millet, MD  cyanocobalamin (VITAMIN B12) 1000 MCG tablet Take 1,000 mcg by mouth daily.   Yes [provider]  HYDROcodone-acetaminophen (NORCO/VICODIN) 5-325 MG tablet Take 1 tablet by mouth every 6 (six) hours as needed for moderate pain (pain score 4-6) or severe pain (pain score  7-10). 01/14/24 02/13/24 Yes Miguel Aschoff, MD  loperamide (IMODIUM) 2 MG capsule Take 1 capsule (2 mg total) by mouth daily as needed for up to 360 doses for diarrhea or loose stools. 05/25/23  Yes Morene Crocker, MD  triamterene-hydrochlorothiazide (MAXZIDE-25) 37.5-25 MG tablet Take 1 tablet by mouth daily. 05/25/23  Yes Morene Crocker, MD  diclofenac Sodium (VOLTAREN) 1 % GEL Apply 2 g topically 4 (four) times daily as needed. Patient not taking: Reported on 02/03/2024 02/01/21   Miguel Aschoff, MD  vitamin D3 (CHOLECALCIFEROL) 25 MCG tablet Take 1 tablet (1,000 Units total) by mouth daily. Patient not taking: Reported on 01/25/2024 05/15/23   Miguel Aschoff, MD      Allergies    Vancomycin and Morphine    Review of Systems   Review of Systems  Neurological:  Positive for weakness.    Physical Exam Updated Vital Signs BP (!) 152/72 (BP Location: Left Arm)   Pulse 73   Temp (!) 97.5 F (36.4 C) (Oral)   Resp 16   Ht 5' (1.524 m)   Wt 72.6 kg   SpO2 97%   BMI 31.25 kg/m  Physical Exam Vitals and nursing note reviewed.  HENT:     Head: Normocephalic and atraumatic.  Eyes:     Pupils: Pupils are equal, round, and reactive to light.  Cardiovascular:     Rate and Rhythm: Normal rate and regular rhythm.  Pulmonary:     Effort: Pulmonary effort is normal.     Breath sounds: Normal breath sounds.  Abdominal:     Palpations: Abdomen is soft.     Tenderness: There is no abdominal tenderness.  Musculoskeletal:     Comments: Chronic deformity of bilateral feet No focal weakness in the lower extremities Symmetric bilateral weakness  Skin:    General: Skin is warm and dry.  Neurological:     General: No focal deficit present.     Mental Status: She is alert. Mental status is at baseline.     Sensory: No sensory deficit.     Motor: No weakness.  Psychiatric:        Mood and Affect: Mood normal.     ED Results / Procedures / Treatments    Labs (all labs ordered are listed, but only abnormal results are displayed) Labs Reviewed  BASIC METABOLIC PANEL WITH GFR - Abnormal; Notable for the following components:      Result Value   Potassium 3.4 (*)    Glucose, Bld 103 (*)    BUN 37 (*)    Creatinine, Ser 1.11 (*)    GFR, Estimated 54 (*)    All other components within normal limits  RESP PANEL BY RT-PCR (RSV, FLU A&B, COVID)  RVPGX2  CBC WITH DIFFERENTIAL/PLATELET    EKG EKG Interpretation Date/Time:  Wednesday February 03 2024 20:41:35 EDT Ventricular Rate:  66 PR Interval:  209 QRS Duration:  86 QT Interval:  417 QTC Calculation: 437 R Axis:   6  Text Interpretation: Sinus rhythm Low voltage, precordial leads Abnormal R-wave progression, early transition Confirmed by Estelle June [phone]) on 02/03/2024 10:56:09 PM  Radiology No results found.  Procedures Procedures    Medications Ordered in ED Medications  alendronate (FOSAMAX) tablet 70 mg (has no administration in time range)  atorvastatin (LIPITOR) tablet 10 mg (has no administration in time range)  Baclofen TABS 5 mg (has no administration in time range)  cyanocobalamin (VITAMIN B12) tablet 1,000 mcg (has no administration in time range)  diclofenac Sodium (VOLTAREN) 1 % topical gel 2 g (has no administration in time range)  HYDROcodone-acetaminophen (NORCO/VICODIN) 5-325 MG per tablet 1 tablet (has no administration in time range)  loperamide (IMODIUM) capsule 2 mg (has no administration in time range)  triamterene-hydrochlorothiazide (MAXZIDE-25) 37.5-25 MG per tablet 1 tablet (has no administration in time range)  cholecalciferol (VITAMIN D3) 25 MCG (1000 UNIT) tablet 1,000 Units (has no administration in time range)    ED Course/ Medical Decision Making/ A&P Clinical Course as of 02/03/24 2259  Wed Feb 03, 2024  2255 Laboratory workup unremarkable.  Renal function at baseline.  COVID influenza RSV all negative.  EKG shows no dysrhythmia or acute  ischemic changes.  Medically cleared for toc evaluation [MP]    Clinical Course User Index [MP] Royanne Foots, DO                                 Medical Decision Making 68 year old female with history as above presenting for increased generalized weakness and difficulty with ambulation at home.  Bilateral lower extremity weakness has been progressing and she is followed closely by physical medicine specialist.  There was a plan to initiate physical therapy and get a wheelchair for her at home but this has not been done yet.  No new focal neurologic deficits or other complaints at this time.  I feel as though her chronic medical issues with cerebral palsy are progressing.  Will obtain basic laboratory workup and EKG here.  She would benefit from a TOC/PT consult considering she does not have a wheelchair or physical therapy at home yet to help her with these services and disposition planning.  We will order her home meds and a diet for her and she will likely be evaluated tomorrow morning  Amount and/or Complexity of Data Reviewed Labs: ordered.           Final Clinical Impression(s) / ED Diagnoses Final diagnoses:  Weakness  Cerebral palsy, unspecified type [facility])    Rx / DC Orders ED Discharge Orders     None         Sallyanne Creamer, DO 02/03/24 2259

## 2024-02-04 DIAGNOSIS — Z743 Need for continuous supervision: Secondary | ICD-10-CM | POA: Diagnosis not present

## 2024-02-04 DIAGNOSIS — R531 Weakness: Secondary | ICD-10-CM | POA: Diagnosis not present

## 2024-02-04 DIAGNOSIS — G809 Cerebral palsy, unspecified: Secondary | ICD-10-CM | POA: Diagnosis not present

## 2024-02-04 DIAGNOSIS — Z7401 Bed confinement status: Secondary | ICD-10-CM | POA: Diagnosis not present

## 2024-02-04 DIAGNOSIS — R079 Chest pain, unspecified: Secondary | ICD-10-CM | POA: Diagnosis not present

## 2024-02-04 NOTE — ED Notes (Signed)
 [facility] called for transport back home. 5th on the list.

## 2024-02-04 NOTE — ED Provider Notes (Signed)
 Patient seen yesterday in the ED for generalized weakness.  No focal diagnosis made. No acute distress. Cleared for physical therapy evaluation this morning.  Home health involved, transitions of care team involved and patient arranged for outpatient care and management per [facility] recommendations.  Disposition:  I have considered need for hospitalization, however, considering all of the above, I believe this patient is stable for discharge at this time.  Patient/family educated about specific return precautions for given chief complaint and symptoms.  Patient/family educated about follow-up with PCP.     Patient/family expressed understanding of return precautions and need for follow-up. Patient spoken to regarding all imaging and laboratory results and appropriate follow up for these results. All education provided in verbal form with additional information in written form. Time was allowed for answering of patient questions. Patient discharged.    Emergency Department Medication Summary:   Medications  atorvastatin (LIPITOR) tablet 10 mg (10 mg Oral Given 02/04/24 0916)  baclofen (LIORESAL) tablet 5 mg (5 mg Oral Given 02/04/24 1717)  cyanocobalamin (VITAMIN B12) tablet 1,000 mcg (1,000 mcg Oral Given 02/04/24 0917)  diclofenac Sodium (VOLTAREN) 1 % topical gel 2 g (has no administration in time range)  HYDROcodone-acetaminophen (NORCO/VICODIN) 5-325 MG per tablet 1 tablet (has no administration in time range)  loperamide (IMODIUM) capsule 2 mg (has no administration in time range)  triamterene-hydrochlorothiazide (MAXZIDE-25) 37.5-25 MG per tablet 1 tablet (1 tablet Oral Given 02/04/24 1000)  cholecalciferol (VITAMIN D3) 25 MCG (1000 UNIT) tablet 1,000 Units (1,000 Units Oral Given 02/04/24 0916)          Onetha Bile, MD 02/04/24 2002

## 2024-02-04 NOTE — ED Notes (Signed)
 Writer spoke with patient's sister who is at patient's home and able to let patient in when [facility] arrives

## 2024-02-04 NOTE — ED Notes (Signed)
Pt received a breakfast tray.

## 2024-02-04 NOTE — Care Management (Addendum)
 Transition of Care [facility]) - Emergency Department Mini Assessment   Patient Details  Name: Kathy Owens MRN: [id number] Date of Birth: 08-30-1956  Transition of Care [facility]) CM/SW Contact:    Naoma Bacca, RN Phone Number: 02/04/2024, 5:10 PM   Clinical Narrative: Received TOC consult for HH/DME. Per chart review patient is at [facility] ED for weakness in BLE. PMH: CP. This RNCM spoke with patient at bedside who reports she wants to go home. Patient reports her sister [name] (home# [phone] & cell# [phone]) comes to check on her daily. Patient reports someone called her sister today re: HH services however she's unsure what agency. Patient reports she has a walker at home and inquiring about wheelchair. Patient unsure how long she's had her walker, this RNCM advised insurance normally pays every 5 years for a walker or wheelchair not both. Patient will follow up with her PCP for DME: wheelchair as she is unsure that she wants d/t space in her home.This RNCM offered HH choice patient chose [facility].  This RNCM spoke with patient's sister [name] who reports [facility] left a message for the patient, no HH services are set up at this time. Daisy reports she's at home waiting on the patient and will stay with her.    Spoke with Thurston Flow with [facility] who will follow patient post discharge, with a start of care date Wednesday 02/10/24.  Notified MD of need co-sign of orders: HHPT/OT, HHA and face to face.  Transportation at discharge: PTAR  No additional TOC needs     ED Mini Assessment: What brought you to the Emergency Department? : generalized weakness and unable to ambulate at home.  Barriers to Discharge: Continued Medical Work up  [employer] interventions: coordinate home [employer] of departure: Ambulance  Interventions which prevented an admission or readmission: Home Health Consult or Services    Patient Contact and Communications        ,          Patient  states their goals for this hospitalization and ongoing recovery are:: to return home for home health services CMS Medicare.gov Compare Post Acute Care list provided to:: Patient Choice offered to / list presented to : Patient  Admission diagnosis:  Weakness Patient Active Problem List   Diagnosis Date Noted   Spasticity 01/25/2024   Corns and callosities 07/22/2022   Uncomplicated opioid dependence (HCC) 07/12/2020   Lactose intolerance in adult (presumptive dx) 07/12/2020   Porcelain gallbladder 11/04/2018   Non-arteritic AION (anterior ischemic optic neuropathy), bilateral 07/01/2017   Chronic pain due to cerebral palsy 12/13/2015   Pain of right midfoot 05/10/2015   Chronic renal insufficiency, stage 3 (moderate) (HCC) 08/04/2014   Vertigo, recurrent 07/06/2014   Subacromial impingement of right shoulder 08/10/2013   Health care maintenance 07/29/2011   Essential hypertension 02/15/2007   Osteoporosis 02/15/2007   Hyperlipemia 10/09/2006   Cerebral palsy (HCC) 10/09/2006   Venous insufficiency of left leg 10/09/2006   PCP:  Sherol Dixie, MD Pharmacy:   [id number] - [place], [place] - [address] AT [facility] OF [address] & [address] [name] [address] Phone: [phone] Fax: [phone]

## 2024-02-04 NOTE — ED Notes (Signed)
 Message sent to pharmacy about Maxzide-25

## 2024-02-05 ENCOUNTER — Telehealth: Payer: Self-pay

## 2024-02-05 NOTE — Telephone Encounter (Signed)
 Received message from Thurston Flow with [facility] on who reports Physical Medicine referred patient to [facility] for [facility] services. Start of services on Monday 02/08/24.  No TOC needs as patient is already discharged.

## 2024-02-08 ENCOUNTER — Telehealth: Payer: Self-pay | Admitting: Physical Medicine and Rehabilitation

## 2024-02-08 NOTE — Telephone Encounter (Signed)
 Patient called in states she will not take Baclofen  5 MG TABS  , states it makes her dizzy and blurry vision . Patient would like to request a muscle relaxer but something that does not make her drowsy . Patient uses [facility] on [address]

## 2024-02-09 ENCOUNTER — Telehealth: Payer: Self-pay | Admitting: Physical Medicine and Rehabilitation

## 2024-02-09 DIAGNOSIS — Z604 Social exclusion and rejection: Secondary | ICD-10-CM | POA: Diagnosis not present

## 2024-02-09 DIAGNOSIS — Z79891 Long term (current) use of opiate analgesic: Secondary | ICD-10-CM | POA: Diagnosis not present

## 2024-02-09 DIAGNOSIS — I130 Hypertensive heart and chronic kidney disease with heart failure and stage 1 through stage 4 chronic kidney disease, or unspecified chronic kidney disease: Secondary | ICD-10-CM | POA: Diagnosis not present

## 2024-02-09 DIAGNOSIS — I509 Heart failure, unspecified: Secondary | ICD-10-CM | POA: Diagnosis not present

## 2024-02-09 DIAGNOSIS — E538 Deficiency of other specified B group vitamins: Secondary | ICD-10-CM | POA: Diagnosis not present

## 2024-02-09 DIAGNOSIS — Z7982 Long term (current) use of aspirin: Secondary | ICD-10-CM | POA: Diagnosis not present

## 2024-02-09 DIAGNOSIS — I872 Venous insufficiency (chronic) (peripheral): Secondary | ICD-10-CM | POA: Diagnosis not present

## 2024-02-09 DIAGNOSIS — N183 Chronic kidney disease, stage 3 unspecified: Secondary | ICD-10-CM | POA: Diagnosis not present

## 2024-02-09 DIAGNOSIS — G800 Spastic quadriplegic cerebral palsy: Secondary | ICD-10-CM | POA: Diagnosis not present

## 2024-02-09 DIAGNOSIS — K219 Gastro-esophageal reflux disease without esophagitis: Secondary | ICD-10-CM | POA: Diagnosis not present

## 2024-02-09 DIAGNOSIS — E785 Hyperlipidemia, unspecified: Secondary | ICD-10-CM | POA: Diagnosis not present

## 2024-02-09 DIAGNOSIS — M810 Age-related osteoporosis without current pathological fracture: Secondary | ICD-10-CM | POA: Diagnosis not present

## 2024-02-09 MED ORDER — TIZANIDINE HCL 4 MG PO TABS: 2.0000 mg | ORAL_TABLET | Freq: Two times a day (BID) | ORAL | 2 refills | Status: DC

## 2024-02-09 NOTE — Telephone Encounter (Signed)
 Having dizziness and slurred speech and blurry vision. She thinks from Baclofen .   Quit taking Norco and Baclofen  because of symptoms.    Wondering if there's another medicine that can help- we discussed Tizanidine  2 mg (so 1/2 tab) 2x/day- explained in some cases, can lower her BP and make her dizzy, all spasticity meds have RISKS of side effects, but doesn't mean it will occur.   Sent to her pharmacy.   Also asked about electric w/c- since gets ride from gentleman from church, would be concerned how she would get to doctor's appt with electrica w/c- I think a manual w/c would go better, but she's still not wanting that kind.   She said would think about it.

## 2024-02-09 NOTE — Telephone Encounter (Signed)
 Patient called to let you know the shoes you wrote a prescription for insurance will not cover them. Please call patient.

## 2024-02-11 ENCOUNTER — Other Ambulatory Visit: Payer: Self-pay | Admitting: Internal Medicine

## 2024-02-11 DIAGNOSIS — G894 Chronic pain syndrome: Secondary | ICD-10-CM

## 2024-02-11 NOTE — Telephone Encounter (Signed)
 Last rx written - 01/14/24. Last OV - 10/28/23 with Dr Esaw Heckler.

## 2024-02-11 NOTE — Telephone Encounter (Signed)
 Copied from CRM [id number]. Topic: Clinical - Medication Refill >> Feb 11, 2024  3:20 PM Hamdi H wrote: Most Recent Primary Care Visit:  Provider: Jayson Michael  Department: [facility]  Visit Type: OPEN ESTABLISHED  Date: 10/28/2023  Medication: HYDROcodone -acetaminophen  (NORCO/VICODIN) 5-325 MG tablet  Has the patient contacted their pharmacy? No (Agent: If no, request that the patient contact the pharmacy for the refill. If patient does not wish to contact the pharmacy document the reason why and proceed with request.) (Agent: If yes, when and what did the pharmacy advise?)  Is this the correct pharmacy for this prescription? Yes If no, delete pharmacy and type the correct one.  This is the patient's preferred pharmacy:  [id number] - [place], [place] - [address] AT [facility] OF [address] [place] [address] Phone: [phone] Fax: [phone]   Has the prescription been filled recently? No  Is the patient out of the medication? No  Has the patient been seen for an appointment in the last year OR does the patient have an upcoming appointment? Yes  Can we respond through MyChart? No  Agent: Please be advised that Rx refills may take up to 3 business days. We ask that you follow-up with your pharmacy.

## 2024-02-12 MED ORDER — HYDROCODONE-ACETAMINOPHEN 5-325 MG PO TABS: 1.0000 | ORAL_TABLET | Freq: Four times a day (QID) | ORAL | 0 refills | Status: DC | PRN

## 2024-02-13 ENCOUNTER — Other Ambulatory Visit: Payer: Self-pay

## 2024-02-13 ENCOUNTER — Emergency Department (HOSPITAL_COMMUNITY)
Admission: EM | Admit: 2024-02-13 | Discharge: 2024-02-15 | Disposition: A | Discharge status: Home / Self Care | Attending: Emergency Medicine | Admitting: Emergency Medicine

## 2024-02-13 ENCOUNTER — Emergency Department (HOSPITAL_COMMUNITY)

## 2024-02-13 DIAGNOSIS — R4781 Slurred speech: Secondary | ICD-10-CM | POA: Diagnosis not present

## 2024-02-13 DIAGNOSIS — Z8673 Personal history of transient ischemic attack (TIA), and cerebral infarction without residual deficits: Secondary | ICD-10-CM | POA: Diagnosis not present

## 2024-02-13 DIAGNOSIS — R6889 Other general symptoms and signs: Secondary | ICD-10-CM | POA: Diagnosis not present

## 2024-02-13 DIAGNOSIS — R531 Weakness: Secondary | ICD-10-CM | POA: Insufficient documentation

## 2024-02-13 DIAGNOSIS — R29818 Other symptoms and signs involving the nervous system: Secondary | ICD-10-CM | POA: Diagnosis not present

## 2024-02-13 DIAGNOSIS — H538 Other visual disturbances: Secondary | ICD-10-CM | POA: Insufficient documentation

## 2024-02-13 DIAGNOSIS — Z79899 Other long term (current) drug therapy: Secondary | ICD-10-CM | POA: Diagnosis not present

## 2024-02-13 DIAGNOSIS — I129 Hypertensive chronic kidney disease with stage 1 through stage 4 chronic kidney disease, or unspecified chronic kidney disease: Secondary | ICD-10-CM | POA: Insufficient documentation

## 2024-02-13 DIAGNOSIS — R278 Other lack of coordination: Secondary | ICD-10-CM | POA: Insufficient documentation

## 2024-02-13 DIAGNOSIS — G319 Degenerative disease of nervous system, unspecified: Secondary | ICD-10-CM | POA: Diagnosis not present

## 2024-02-13 DIAGNOSIS — I6782 Cerebral ischemia: Secondary | ICD-10-CM | POA: Diagnosis not present

## 2024-02-13 DIAGNOSIS — N189 Chronic kidney disease, unspecified: Secondary | ICD-10-CM | POA: Insufficient documentation

## 2024-02-13 LAB — CBC
HCT: 41.1 % (ref 36.0–46.0)
Hemoglobin: 13.8 g/dL (ref 12.0–15.0)
MCH: 29.6 pg (ref 26.0–34.0)
MCHC: 33.6 g/dL (ref 30.0–36.0)
MCV: 88 fL (ref 80.0–100.0)
Platelets: 215 10*3/uL (ref 150–400)
RBC: 4.67 MIL/uL (ref 3.87–5.11)
RDW: 13.6 % (ref 11.5–15.5)
WBC: 6.3 10*3/uL (ref 4.0–10.5)
nRBC: 0 % (ref 0.0–0.2)

## 2024-02-13 LAB — MAGNESIUM: Magnesium: 1.9 mg/dL (ref 1.7–2.4)

## 2024-02-13 LAB — COMPREHENSIVE METABOLIC PANEL WITH GFR
ALT: 19 U/L (ref 0–44)
AST: 21 U/L (ref 15–41)
Albumin: 4.1 g/dL (ref 3.5–5.0)
Alkaline Phosphatase: 45 U/L (ref 38–126)
Anion gap: 11 (ref 5–15)
BUN: 37 mg/dL — ABNORMAL HIGH (ref 8–23)
CO2: 24 mmol/L (ref 22–32)
Calcium: 9.3 mg/dL (ref 8.9–10.3)
Chloride: 104 mmol/L (ref 98–111)
Creatinine, Ser: 1.03 mg/dL — ABNORMAL HIGH (ref 0.44–1.00)
GFR, Estimated: 59 mL/min — ABNORMAL LOW (ref >60)
Glucose, Bld: 103 mg/dL — ABNORMAL HIGH (ref 70–99)
Potassium: 2.9 mmol/L — ABNORMAL LOW (ref 3.5–5.1)
Sodium: 139 mmol/L (ref 135–145)
Total Bilirubin: 1.2 mg/dL (ref 0.0–1.2)
Total Protein: 7 g/dL (ref 6.5–8.1)

## 2024-02-13 LAB — I-STAT CHEM 8, ED
BUN: 37 mg/dL — ABNORMAL HIGH (ref 8–23)
Calcium, Ion: 1.18 mmol/L (ref 1.15–1.40)
Chloride: 105 mmol/L (ref 98–111)
Creatinine, Ser: 1.1 mg/dL — ABNORMAL HIGH (ref 0.44–1.00)
Glucose, Bld: 98 mg/dL (ref 70–99)
HCT: 41 % (ref 36.0–46.0)
Hemoglobin: 13.9 g/dL (ref 12.0–15.0)
Potassium: 2.9 mmol/L — ABNORMAL LOW (ref 3.5–5.1)
Sodium: 141 mmol/L (ref 135–145)
TCO2: 23 mmol/L (ref 22–32)

## 2024-02-13 LAB — DIFFERENTIAL
Abs Immature Granulocytes: 0.01 10*3/uL (ref 0.00–0.07)
Basophils Absolute: 0 10*3/uL (ref 0.0–0.1)
Basophils Relative: 1 %
Eosinophils Absolute: 0.3 10*3/uL (ref 0.0–0.5)
Eosinophils Relative: 5 %
Immature Granulocytes: 0 %
Lymphocytes Relative: 23 %
Lymphs Abs: 1.5 10*3/uL (ref 0.7–4.0)
Monocytes Absolute: 0.5 10*3/uL (ref 0.1–1.0)
Monocytes Relative: 8 %
Neutro Abs: 4 10*3/uL (ref 1.7–7.7)
Neutrophils Relative %: 63 %

## 2024-02-13 LAB — PROTIME-INR
INR: 1 (ref 0.8–1.2)
Prothrombin Time: 13.4 s (ref 11.4–15.2)

## 2024-02-13 MED ORDER — POTASSIUM CHLORIDE 10 MEQ/100ML IV SOLN
10.0000 meq | Freq: Once | INTRAVENOUS | Status: AC
  Administered 2024-02-13: 10 meq via INTRAVENOUS
  Filled 2024-02-13: qty 100

## 2024-02-13 MED ORDER — TIZANIDINE HCL 4 MG PO TABS: 2.0000 mg | ORAL_TABLET | Freq: Two times a day (BID) | ORAL | Status: DC

## 2024-02-13 MED ORDER — TRIAMTERENE-HCTZ 37.5-25 MG PO TABS
1.0000 | ORAL_TABLET | Freq: Every day | ORAL | Status: DC
  Administered 2024-02-13 – 2024-02-14 (×2): 1 via ORAL
  Filled 2024-02-13 (×2): qty 1

## 2024-02-13 MED ORDER — POTASSIUM CHLORIDE CRYS ER 20 MEQ PO TBCR
40.0000 meq | EXTENDED_RELEASE_TABLET | Freq: Once | ORAL | Status: AC
  Administered 2024-02-13: 40 meq via ORAL
  Filled 2024-02-13: qty 2

## 2024-02-13 MED ORDER — BACLOFEN 10 MG PO TABS
10.0000 mg | ORAL_TABLET | Freq: Two times a day (BID) | ORAL | Status: DC
  Administered 2024-02-13: 10 mg via ORAL
  Filled 2024-02-13: qty 1

## 2024-02-13 MED ORDER — TIZANIDINE HCL 4 MG PO TABS
2.0000 mg | ORAL_TABLET | Freq: Two times a day (BID) | ORAL | Status: DC
  Administered 2024-02-14 (×2): 2 mg via ORAL
  Filled 2024-02-13 (×2): qty 1

## 2024-02-13 MED ORDER — HYDROCODONE-ACETAMINOPHEN 5-325 MG PO TABS: 1.0000 | ORAL_TABLET | Freq: Four times a day (QID) | ORAL | Status: DC | PRN

## 2024-02-13 MED ORDER — ATORVASTATIN CALCIUM 10 MG PO TABS
20.0000 mg | ORAL_TABLET | Freq: Every day | ORAL | Status: DC
  Administered 2024-02-13 – 2024-02-14 (×2): 20 mg via ORAL
  Filled 2024-02-13 (×2): qty 2

## 2024-02-13 MED ORDER — ALENDRONATE SODIUM 70 MG PO TABS: 70.0000 mg | ORAL_TABLET | ORAL | Status: DC

## 2024-02-13 NOTE — ED Triage Notes (Signed)
 PT BIB [facility] from home with c/c of hallucinations weakness and slurred speech.  PT claims LKW 1930 yesterday.  [facility] Vitals: BP 138/46, HR 66, o2 98% RA, 16 RR, CBG 110. Aox4.

## 2024-02-13 NOTE — ED Notes (Addendum)
 Patient has returned from US ; placed on a bedpan.

## 2024-02-13 NOTE — ED Notes (Signed)
 CCMD updated to remove cardiac monitoring; pt is now TOC hold-Monique,RN

## 2024-02-13 NOTE — ED Provider Notes (Signed)
  Physical Exam  BP (!) 129/52   Pulse 69   Temp 98.5 F (36.9 C) (Oral)   Resp 16   Ht 5' (1.524 m)   Wt 72.6 kg   SpO2 98%   BMI 31.25 kg/m   Physical Exam  Procedures  Procedures  ED Course / MDM    Medical Decision Making Amount and/or Complexity of Data Reviewed Labs: ordered. Radiology: ordered.  Risk Prescription drug management.   Melody Spurling, assumed care for this patient.  In brief 68 year old female with a history of cerebral palsy who is here today for weakness.  Patient is difficulty ambulating in her home, lives alone and has a sister check on her.  Patient was signed out pending MRI.  Patient is MRI negative for acute process.  Patient's labs do show hypokalemia.  Have replaced.  This time, do not believe patient has an acute medical process.  This appears to be more chronic deconditioning, some mild electrolyte abnormalities which do not require admission.  Will place patient in boarder status.  Patient would benefit from additional home health support.       Afton Horse T, DO 02/13/24 2025

## 2024-02-13 NOTE — ED Provider Notes (Signed)
 [place] EMERGENCY DEPARTMENT AT [facility] Provider Note   CSN: [id number] Arrival date & time: 02/13/24  1404     History  Chief Complaint  Patient presents with   Weakness    Kathy Owens is a 68 y.o. female.  HPI Patient presents for weakness.  Medical history includes cerebral palsy, HTN, HLD, osteoporosis, CKD, GERD.  She was seen in the ED 10 days ago for lower extremity weakness.  At the time, she was living on her own in an independent living facility.  She remained in the ED overnight for PT evaluation.  She was discharged with home health PT and OT services.  Last week, she developed dizziness, slurred speech, and blurry vision.  She felt that it may be secondary to the baclofen .  She stopped taking her Norco and baclofen .  She was switched to tizanidine .  Over the past 4 days, she has had worsening symptoms of slurred speech, blurry vision, and what she describes as generalized weakness.  She does continue to live alone.  She typically ambulates throughout her home with a walker.  Her sister comes by to check on her.  She states that she does not have home health PT or OT.  Today, symptoms worsened.  She was unable to ambulate, stating that her legs would not work.  She denies any recent areas of pain or discomfort.    Home Medications Prior to Admission medications   Medication Sig Start Date End Date Taking? Authorizing Provider  alendronate  (FOSAMAX ) 70 MG tablet Take 1 tablet (70 mg total) by mouth every 7 (seven) days. Take with a full glass of water on an empty stomach. 04/16/23 04/15/24  Sherol Dixie, MD  atorvastatin  (LIPITOR) 10 MG tablet TAKE 1 TABLET(10 MG) BY MOUTH DAILY 04/21/23   Sherol Dixie, MD  Baclofen  5 MG TABS 5 mg in AM , 5 mg in afternoon and 10 mg nightly- for spasms/tightness- at least 4 hours in between meds- no more than 8 hours. 01/25/24   Lovorn, Megan, MD  cyanocobalamin  (VITAMIN B12) 1000 MCG tablet Take 1,000 mcg by mouth  daily.    [provider]  diclofenac  Sodium (VOLTAREN ) 1 % GEL Apply 2 g topically 4 (four) times daily as needed. Patient not taking: Reported on 02/03/2024 02/01/21   Sherol Dixie, MD  HYDROcodone -acetaminophen  (NORCO/VICODIN) 5-325 MG tablet Take 1 tablet by mouth every 6 (six) hours as needed for moderate pain (pain score 4-6) or severe pain (pain score 7-10). 02/12/24 03/13/24  Sherol Dixie, MD  loperamide  (IMODIUM ) 2 MG capsule Take 1 capsule (2 mg total) by mouth daily as needed for up to 360 doses for diarrhea or loose stools. 05/25/23   Cathey Clunes, MD  tiZANidine  (ZANAFLEX ) 4 MG tablet Take 0.5 tablets (2 mg total) by mouth 2 (two) times daily. 02/09/24   Lovorn, Megan, MD  triamterene -hydrochlorothiazide  (MAXZIDE -25) 37.5-25 MG tablet Take 1 tablet by mouth daily. 05/25/23   Gomez-Caraballo, Maria, MD  vitamin D3 (CHOLECALCIFEROL ) 25 MCG tablet Take 1 tablet (1,000 Units total) by mouth daily. Patient not taking: Reported on 01/25/2024 05/15/23   Sherol Dixie, MD      Allergies    Vancomycin  and Morphine    Review of Systems   Review of Systems  Eyes:  Positive for visual disturbance.  Neurological:  Positive for speech difficulty and weakness.  All other systems reviewed and are negative.   Physical Exam Updated Vital Signs BP 133/63  Pulse 60   Temp 98.5 F (36.9 C) (Oral)   Resp 16   Ht 5' (1.524 m)   Wt 72.6 kg   SpO2 100%   BMI 31.25 kg/m  Physical Exam Vitals and nursing note reviewed.  Constitutional:      General: She is not in acute distress.    Appearance: Normal appearance. She is well-developed. She is not ill-appearing, toxic-appearing or diaphoretic.  HENT:     Head: Normocephalic and atraumatic.     Right Ear: External ear normal.     Left Ear: External ear normal.     Nose: Nose normal.     Mouth/Throat:     Mouth: Mucous membranes are moist.  Eyes:     Extraocular Movements: Extraocular movements intact.      Conjunctiva/sclera: Conjunctivae normal.  Cardiovascular:     Rate and Rhythm: Normal rate and regular rhythm.  Pulmonary:     Effort: Pulmonary effort is normal. No respiratory distress.  Abdominal:     General: There is no distension.     Palpations: Abdomen is soft.  Musculoskeletal:        General: No swelling.     Cervical back: Normal range of motion and neck supple.  Skin:    General: Skin is warm and dry.  Neurological:     Mental Status: She is alert.     Sensory: Sensory deficit present.     Motor: Weakness present.     Coordination: Coordination abnormal.     Comments: Slurred speech event on penis today is present.  Patient endorses diminished sensation on left upper and lower extremities.  She does have mild dysmetria on left hemibody.  She does seem to have slight left leg weakness.  Psychiatric:        Mood and Affect: Mood normal.        Behavior: Behavior normal.     ED Results / Procedures / Treatments   Labs (all labs ordered are listed, but only abnormal results are displayed) Labs Reviewed - No data to display  EKG None  Radiology No results found.  Procedures Procedures    Medications Ordered in ED Medications - No data to display  ED Course/ Medical Decision Making/ A&P                                 Medical Decision Making  This patient presents to the ED for concern of weakness, this involves an extensive number of treatment options, and is a complaint that carries with it a high risk of complications and morbidity.  The differential diagnosis includes polypharmacy, CVA, deconditioning, metabolic derangement, infection, neoplasm   Co morbidities that complicate the patient evaluation  cerebral palsy, HTN, HLD, osteoporosis, CKD, GERD   Additional history obtained:  Additional history obtained from N/A External records from outside source obtained and reviewed including EMR   Lab Tests:  I Ordered, and personally interpreted  labs.  The pertinent results include: Prendes pending at time of signout)   Imaging Studies ordered:  I ordered imaging studies including CT head, MRI brain I independently visualized and interpreted imaging which showed this pending at time of signout) I agree with the radiologist interpretation   Cardiac Monitoring: / EKG:  The patient was maintained on a cardiac monitor.  I personally viewed and interpreted the cardiac monitored which showed an underlying rhythm of: Sinus rhythm   Problem List / ED  Course / Critical interventions / Medication management  Patient presents for worsening symptoms over the last 4 days consisting of generalized weakness, intermittent blurry vision, slurred speech.  On arrival in the ED, vital signs are reassuring.  She is alert and oriented.  She does have some slurred speech but is also missing several teeth.  She does feel like her speech is not baseline for her.  On neuroexam, she does have mild dysmetria on her left hemibody.  She endorses some diminished sensation in left upper and lower extremities.  Left lower extremity does have some mild weakness when compared to the right.  Presentation is concerning for subacute stroke.  Workup was initiated.  Care of patient was signed out to oncoming ED provider.  Social Determinants of Health:  Lives independently        Final Clinical Impression(s) / ED Diagnoses Final diagnoses:  Weakness    Rx / DC Orders ED Discharge Orders     None         Iva Mariner, MD 02/13/24 1444

## 2024-02-14 DIAGNOSIS — R531 Weakness: Secondary | ICD-10-CM | POA: Diagnosis not present

## 2024-02-14 NOTE — Progress Notes (Signed)
 Physical Therapy Evaluation Patient Details Name: Kathy Owens MRN: [id number] DOB: March 23, 1956 Today's Date: 02/14/2024  History of Present Illness  Kathy Owens is a 68 y.o. female who presented to ED 02/13/24 for worsening symptoms of slurred speech, blurry vision, and generalized weakness. CT and MRI showed no acute intracranial abnormality. PMH of cerebral palsy, HTN, HLD, osteoporosis, CKD, GERD.   Clinical Impression  Pt admitted with above diagnosis. PTA, pt was modI for functional mobility using RW and independent with ADLs. She lives alone in a handicap accessible ground floor apartment. She reports her sister checks in on her everyday. Pt currently with functional limitations due to the deficits listed below (see PT Problem List). She appears to be significantly below her reported PLOF. Pt required max-totalA for bed mobility, modA to stand, and mod-maxA to ambulate ~39ft within the room. She quickly fatigued and appears chronically deconditioned. Pt will benefit from acute skilled PT to increase their independence and safety with mobility to allow discharge. Recommend continued inpatient follow up therapy, <3 hours/day.    If plan is discharge home, recommend the following: A lot of help with walking and/or transfers;A lot of help with bathing/dressing/bathroom;Assistance with cooking/housework;Assist for transportation;Help with stairs or ramp for entrance   Can travel by private vehicle   Yes    Equipment Recommendations Wheelchair (measurements PT);Wheelchair cushion (measurements PT);BSC/3in1  Recommendations for Other Services  OT consult    Functional Status Assessment Patient has had a recent decline in their functional status and demonstrates the ability to make significant improvements in function in a reasonable and predictable amount of time.     Precautions / Restrictions Precautions Precautions: Fall Recall of Precautions/Restrictions:  Impaired Restrictions Weight Bearing Restrictions Per Provider Order: No      Mobility  Bed Mobility Overal bed mobility: Needs Assistance Bed Mobility: Supine to Sit, Sit to Supine, Rolling Rolling: Mod assist, Used rails   Supine to sit: Max assist, HOB elevated Sit to supine: Total assist, HOB elevated, +2 for physical assistance   General bed mobility comments: Pt sat up on the R side of stretcher with increased time. She required assistance bringing legs off, trunk upright, and scooting fwd. Pt quickly fatigued and was becoming more anxious. Required 2+ assist to manage trunk and LE to return pt to bed and reposition. Rolled R/L to adjust bed pads underneath her with modA at pelvis to faciliate sidelying.    Transfers Overall transfer level: Needs assistance Equipment used: 2 person hand held assist Transfers: Sit to/from Stand Sit to Stand: Mod assist, From elevated surface           General transfer comment: Pt stood from stretcher, which was an elevated surface given her height. PT provided anterior knee block for safety and pt held on to PT's elbow for stability.    Ambulation/Gait Ambulation/Gait assistance: Mod assist Gait Distance (Feet): 5 Feet Assistive device: 2 person hand held assist Gait Pattern/deviations: Step-to pattern, Scissoring, Decreased step length - right, Decreased step length - left, Trunk flexed, Knee flexed in stance - right, Knee flexed in stance - left, Decreased dorsiflexion - right, Decreased dorsiflexion - left, Ataxic Gait velocity: decreased Gait velocity interpretation: <1.31 ft/sec, indicative of household ambulator   General Gait Details: Pt ambulated within the room with modA and step by step VC to aid in sequencing of gait. She appeared very labored with movements and quickly fatigued.  Stairs            Psychologist, prison and probation services  Tilt Bed    Modified Rankin (Stroke Patients Only)       Balance Overall balance  assessment: Needs assistance Sitting-balance support: Bilateral upper extremity supported, Feet supported Sitting balance-Leahy Scale: Poor Sitting balance - Comments: Pt required modA for support while seated on stretcher Postural control: Posterior lean Standing balance support: Bilateral upper extremity supported, During functional activity Standing balance-Leahy Scale: Poor Standing balance comment: Pt dependent on BUE support and modA.                             Pertinent Vitals/Pain Pain Assessment Pain Assessment: No/denies pain    Home Living Family/patient expects to be discharged to:: Private residence Living Arrangements: Alone Available Help at Discharge: Family;Available PRN/intermittently (Sister comes to check in on her everday) Type of Home: Apartment Home Access: Level entry       Home Layout: One level Home Equipment: Agricultural consultant (2 wheels)      Prior Function Prior Level of Function : Independent/Modified Independent             Mobility Comments: ModI using RW. Denies falls in the last 19mo. ADLs Comments: Indep with ADLs and modI for most IADLs. Relies on sister for transporation.     Extremity/Trunk Assessment   Upper Extremity Assessment Upper Extremity Assessment: Generalized weakness    Lower Extremity Assessment Lower Extremity Assessment: Generalized weakness    Cervical / Trunk Assessment Cervical / Trunk Assessment: Kyphotic  Communication   Communication Communication: No apparent difficulties    Cognition Arousal: Alert Behavior During Therapy: Anxious   PT - Cognitive impairments: No family/caregiver present to determine baseline                       PT - Cognition Comments: Pt is very fearful of falling and became quickly overwhelmed during activity required max encouragement and consolement. Following commands: Intact       Cueing Cueing Techniques: Verbal cues, Gestural cues     General  Comments      Exercises     Assessment/Plan    PT Assessment Patient needs continued PT services  PT Problem List Decreased strength;Decreased activity tolerance;Decreased balance;Decreased mobility;Decreased safety awareness;Decreased knowledge of use of DME       PT Treatment Interventions DME instruction;Gait training;Functional mobility training;Therapeutic activities;Therapeutic exercise;Balance training;Patient/family education    PT Goals (Current goals can be found in the Care Plan section)  Acute Rehab PT Goals Patient Stated Goal: Return Home and get stronger PT Goal Formulation: With patient Time For Goal Achievement: 02/28/24 Potential to Achieve Goals: Fair    Frequency Min 2X/week     Co-evaluation               AM-PAC PT "6 Clicks" Mobility  Outcome Measure Help needed turning from your back to your side while in a flat bed without using bedrails?: Total Help needed moving from lying on your back to sitting on the side of a flat bed without using bedrails?: Total Help needed moving to and from a bed to a chair (including a wheelchair)?: A Lot Help needed standing up from a chair using your arms (e.g., wheelchair or bedside chair)?: A Lot Help needed to walk in hospital room?: A Lot Help needed climbing 3-5 steps with a railing? : Total 6 Click Score: 9    End of Session Equipment Utilized During Treatment: Gait belt Activity Tolerance: Patient limited by fatigue Patient  left: in bed;with call bell/phone within reach Nurse Communication: Mobility status PT Visit Diagnosis: Muscle weakness (generalized) (M62.81);Difficulty in walking, not elsewhere classified (R26.2);Unsteadiness on feet (R26.81);Other abnormalities of gait and mobility (R26.89)    Time: 9563-8756 PT Time Calculation (min) (ACUTE ONLY): 22 min   Charges:   PT Evaluation $PT Eval Moderate Complexity: 1 Mod   PT General Charges $$ ACUTE PT VISIT: 1 Visit         Glenford Lanes, PT,  DPT Acute Rehabilitation Services Office: [phone] Secure Chat Preferred  Riva Chester 02/14/2024, 1:38 PM

## 2024-02-14 NOTE — TOC Initial Note (Signed)
 Transition of Care [facility]) - Initial/Assessment Note    Patient Details  Name: Kathy Owens MRN: [id number] Date of Birth: 11-06-1955  Transition of Care [facility]) CM/SW Contact:    Ane Keener, LCSWA Phone Number: 02/14/2024, 10:04 AM  Clinical Narrative:   CSW spoke with patient at bedside who stated she is unsure at this time if she wants to go home with home health or to a skilled nursing facility if it's recommended. Patient states she is afraid of nursing homes and she doesn't want to stay there permanently. CSW did explain that insurance typically covers the first 21 days and she does have the option to discharge home. The patient gave permission for CSW to contact her sister. CSW spoke with the patients sister, Kathy Owens [phone]. Kathy Owens stated that she is at the patients home every day so if the patient wants to discharge home she will have someone to care for herr. Daisy told CSW that patient has [facility]/OT [facility] and they are supposed to start this week. The patient's sister stated if patient discharges home today she will need PTAR.                 Expected Discharge Plan:  (Skilled nursing vs. Home) Barriers to Discharge: Continued Medical Work up   Patient Goals and CMS Choice Patient states their goals for this hospitalization and ongoing recovery are:: Return home with home health services or skilled nursing   Choice offered to / list presented to : Patient      Expected Discharge Plan and Services       Living arrangements for the past 2 months: Apartment                   DME Agency:  ([facility])                  Prior Living Arrangements/Services Living arrangements for the past 2 months: Apartment Lives with:: Self Patient language and need for interpreter reviewed:: Yes Do you feel safe going back to the place where you live?: Yes      Need for Family Participation in Patient Care: Yes (Comment) Care giver support system in place?:  Yes (comment) Current home services: DME, Home OT, Home PT Criminal Activity/Legal Involvement Pertinent to Current Situation/Hospitalization: No - Comment as needed  Activities of Daily Living      Permission Sought/Granted   Permission granted to share information with : Yes, Verbal Permission Granted  Share Information with NAME: [name]     Permission granted to share info w Relationship: Sister  Permission granted to share info w Contact Information: [phone]  Emotional Assessment Appearance:: Appears stated age Attitude/Demeanor/Rapport: Engaged Affect (typically observed): Happy Orientation: : Oriented to Self, Oriented to Place, Oriented to  Time, Oriented to Situation Alcohol / Substance Use: Not Applicable Psych Involvement: No (comment)  Admission diagnosis:  weakness Patient Active Problem List   Diagnosis Date Noted   Spasticity 01/25/2024   Corns and callosities 07/22/2022   Uncomplicated opioid dependence (HCC) 07/12/2020   Lactose intolerance in adult (presumptive dx) 07/12/2020   Porcelain gallbladder 11/04/2018   Non-arteritic AION (anterior ischemic optic neuropathy), bilateral 07/01/2017   Chronic pain due to cerebral palsy 12/13/2015   Pain of right midfoot 05/10/2015   Chronic renal insufficiency, stage 3 (moderate) (HCC) 08/04/2014   Vertigo, recurrent 07/06/2014   Subacromial impingement of right shoulder 08/10/2013   Health care maintenance 07/29/2011   Essential hypertension 02/15/2007  Osteoporosis 02/15/2007   Hyperlipemia 10/09/2006   Cerebral palsy (HCC) 10/09/2006   Venous insufficiency of left leg 10/09/2006   PCP:  Sherol Dixie, MD Pharmacy:   [id number] - [place], [place] - [address] AT [facility] OF [address] & [address] [facility] [address] Phone: [phone] Fax: [phone]     Social Drivers of Health (SDOH) Social History: SDOH Screenings   Food  Insecurity: Food Insecurity Present (06/17/2023)  Housing: Low Risk  (06/17/2023)  Transportation Needs: No Transportation Needs (06/17/2023)  Utilities: Not At Risk (06/17/2023)  Alcohol Screen: Low Risk  (06/17/2023)  Depression (PHQ2-9): Low Risk  (01/25/2024)  Financial Resource Strain: Low Risk  (06/17/2023)  Physical Activity: Insufficiently Active (06/17/2023)  Social Connections: Moderately Isolated (06/17/2023)  Stress: No Stress Concern Present (06/17/2023)  Tobacco Use: Low Risk  (02/03/2024)  Health Literacy: Adequate Health Literacy (06/17/2023)   SDOH Interventions:     Readmission Risk Interventions     No data to display

## 2024-02-14 NOTE — NC FL2 (Signed)
 [place]  MEDICAID FL2 LEVEL OF CARE FORM     IDENTIFICATION  Patient Name: Kathy Owens Birthdate: May 25, 1956 Sex: female Admission Date (Current Location): 02/13/2024  [facility] and [place] Number:  Producer, television/film/video and Address:  [facility], [address], [place], [place] [address]      Provider Number: [id number]  Attending Physician Name and Address:  No att. providers found  Relative Name and Phone Number:  Adella Agee (Sister)  [phone]    Current Level of Care: Hospital Recommended Level of Care: Skilled Nursing Facility Prior Approval Number:    Date Approved/Denied:   PASRR Number: [id number]  Discharge Plan: SNF    Current Diagnoses: Patient Active Problem List   Diagnosis Date Noted   Spasticity 01/25/2024   Corns and callosities 07/22/2022   Uncomplicated opioid dependence (HCC) 07/12/2020   Lactose intolerance in adult (presumptive dx) 07/12/2020   Porcelain gallbladder 11/04/2018   Non-arteritic AION (anterior ischemic optic neuropathy), bilateral 07/01/2017   Chronic pain due to cerebral palsy 12/13/2015   Pain of right midfoot 05/10/2015   Chronic renal insufficiency, stage 3 (moderate) (HCC) 08/04/2014   Vertigo, recurrent 07/06/2014   Subacromial impingement of right shoulder 08/10/2013   Health care maintenance 07/29/2011   Essential hypertension 02/15/2007   Osteoporosis 02/15/2007   Hyperlipemia 10/09/2006   Cerebral palsy (HCC) 10/09/2006   Venous insufficiency of left leg 10/09/2006    Orientation RESPIRATION BLADDER Height & Weight     Self, Time, Situation, Place  Normal Continent Weight: 160 lb (72.6 kg) Height:  5' (152.4 cm)  BEHAVIORAL SYMPTOMS/MOOD NEUROLOGICAL BOWEL NUTRITION STATUS  N/A    Continent Diet (Regular)  AMBULATORY STATUS COMMUNICATION OF NEEDS Skin   Extensive Assist Verbally Normal                       Personal Care Assistance Level of Assistance  Bathing,  Feeding, Dressing Bathing Assistance: Maximum assistance Feeding assistance: Limited assistance Dressing Assistance: Maximum assistance     Functional Limitations Info  Sight, Hearing, Speech Sight Info: Adequate Hearing Info: Adequate Speech Info: Adequate    SPECIAL CARE FACTORS FREQUENCY  PT (By licensed PT)     PT Frequency: Min 5x weekly              Contractures Contractures Info: Not present    Additional Factors Info  Code Status, Allergies Code Status Info: Full Allergies Info: Morphine, Vancomycin            Current Medications (02/14/2024):  This is the current hospital active medication list Current Facility-Administered Medications  Medication Dose Route Frequency Provider Last Rate Last Admin   [START ON 02/18/2024] alendronate  (FOSAMAX ) tablet 70 mg  70 mg Oral Q7 days Afton Horse T, DO       atorvastatin  (LIPITOR) tablet 20 mg  20 mg Oral Daily Afton Horse T, DO   20 mg at 02/14/24 1005   HYDROcodone -acetaminophen  (NORCO/VICODIN) 5-325 MG per tablet 1 tablet  1 tablet Oral Q6H PRN Afton Horse T, DO       tiZANidine  (ZANAFLEX ) tablet 2 mg  2 mg Oral BID Afton Horse T, DO   2 mg at 02/14/24 1005   triamterene -hydrochlorothiazide  (MAXZIDE -25) 37.5-25 MG per tablet 1 tablet  1 tablet Oral Daily Afton Horse T, DO   1 tablet at 02/14/24 1005   Current Outpatient Medications  Medication Sig Dispense Refill   alendronate  (FOSAMAX ) 70 MG tablet Take 1 tablet (70 mg  total) by mouth every 7 (seven) days. Take with a full glass of water on an empty stomach. 4 tablet 11   atorvastatin  (LIPITOR) 10 MG tablet TAKE 1 TABLET(10 MG) BY MOUTH DAILY 90 tablet 3   cyanocobalamin  (VITAMIN B12) 1000 MCG tablet Take 1,000 mcg by mouth daily.     HYDROcodone -acetaminophen  (NORCO/VICODIN) 5-325 MG tablet Take 1 tablet by mouth every 6 (six) hours as needed for moderate pain (pain score 4-6) or severe pain (pain score 7-10). 120 tablet 0   loperamide  (IMODIUM ) 2  MG capsule Take 1 capsule (2 mg total) by mouth daily as needed for up to 360 doses for diarrhea or loose stools. 90 capsule 3   tiZANidine  (ZANAFLEX ) 4 MG tablet Take 0.5 tablets (2 mg total) by mouth 2 (two) times daily. 30 tablet 2   triamterene -hydrochlorothiazide  (MAXZIDE -25) 37.5-25 MG tablet Take 1 tablet by mouth daily. 90 tablet 3   Baclofen  5 MG TABS 5 mg in AM , 5 mg in afternoon and 10 mg nightly- for spasms/tightness- at least 4 hours in between meds- no more than 8 hours. (Patient not taking: Reported on 02/13/2024) 120 tablet 5     Discharge Medications: Please see discharge summary for a list of discharge medications.  Relevant Imaging Results:  Relevant Lab Results:   Additional Information SSN: [id number]  Ane Keener, LCSWA

## 2024-02-14 NOTE — Care Management (Signed)
 Transition of Care [facility]) - Emergency Department Mini Assessment   Patient Details  Name: Kathy Owens MRN: [id number] Date of Birth: [birth year]  Transition of Care [facility]) CM/SW Contact:    Ronni Colace, RN Phone Number: 02/14/2024, 9:33 AM   Clinical Narrative:  ED patient on hold for PT evaluation possible placement. Confirmed with Heather from [facility] that she is active with them start date 4/21. Awaiting PT evaluation   ED Mini Assessment: What brought you to the Emergency Department? : weakness difficult ambulation           Interventions which prevented an admission or readmission: Home Health Consult or Services (physical therapy consult)    Patient Contact and Communications        ,                 Admission diagnosis:  weakness Patient Active Problem List   Diagnosis Date Noted   Spasticity 01/25/2024   Corns and callosities 07/22/2022   Uncomplicated opioid dependence (HCC) 07/12/2020   Lactose intolerance in adult (presumptive dx) 07/12/2020   Porcelain gallbladder 11/04/2018   Non-arteritic AION (anterior ischemic optic neuropathy), bilateral 07/01/2017   Chronic pain due to cerebral palsy 12/13/2015   Pain of right midfoot 05/10/2015   Chronic renal insufficiency, stage 3 (moderate) (HCC) 08/04/2014   Vertigo, recurrent 07/06/2014   Subacromial impingement of right shoulder 08/10/2013   Health care maintenance 07/29/2011   Essential hypertension 02/15/2007   Osteoporosis 02/15/2007   Hyperlipemia 10/09/2006   Cerebral palsy (HCC) 10/09/2006   Venous insufficiency of left leg 10/09/2006   PCP:  Sherol Dixie, MD Pharmacy:   [facility] #[id number] - [place], [place] - [address] AT [facility] OF [address] & [address] [name] [address] Phone: [phone] Fax: [phone]

## 2024-02-15 DIAGNOSIS — E739 Lactose intolerance, unspecified: Secondary | ICD-10-CM | POA: Diagnosis not present

## 2024-02-15 DIAGNOSIS — M810 Age-related osteoporosis without current pathological fracture: Secondary | ICD-10-CM | POA: Diagnosis not present

## 2024-02-15 DIAGNOSIS — Z743 Need for continuous supervision: Secondary | ICD-10-CM | POA: Diagnosis not present

## 2024-02-15 DIAGNOSIS — R278 Other lack of coordination: Secondary | ICD-10-CM | POA: Diagnosis not present

## 2024-02-15 DIAGNOSIS — R262 Difficulty in walking, not elsewhere classified: Secondary | ICD-10-CM | POA: Diagnosis not present

## 2024-02-15 DIAGNOSIS — K828 Other specified diseases of gallbladder: Secondary | ICD-10-CM | POA: Diagnosis not present

## 2024-02-15 DIAGNOSIS — Z7401 Bed confinement status: Secondary | ICD-10-CM | POA: Diagnosis not present

## 2024-02-15 DIAGNOSIS — R531 Weakness: Secondary | ICD-10-CM | POA: Diagnosis not present

## 2024-02-15 DIAGNOSIS — I872 Venous insufficiency (chronic) (peripheral): Secondary | ICD-10-CM | POA: Diagnosis not present

## 2024-02-15 DIAGNOSIS — H47013 Ischemic optic neuropathy, bilateral: Secondary | ICD-10-CM | POA: Diagnosis not present

## 2024-02-15 DIAGNOSIS — E785 Hyperlipidemia, unspecified: Secondary | ICD-10-CM | POA: Diagnosis not present

## 2024-02-15 DIAGNOSIS — I10 Essential (primary) hypertension: Secondary | ICD-10-CM | POA: Diagnosis not present

## 2024-02-15 DIAGNOSIS — K219 Gastro-esophageal reflux disease without esophagitis: Secondary | ICD-10-CM | POA: Diagnosis not present

## 2024-02-15 DIAGNOSIS — G809 Cerebral palsy, unspecified: Secondary | ICD-10-CM | POA: Diagnosis not present

## 2024-02-15 DIAGNOSIS — R252 Cramp and spasm: Secondary | ICD-10-CM | POA: Diagnosis not present

## 2024-02-15 NOTE — ED Notes (Signed)
 PTAR has been canceled due to [facility] needing extra time for arrival. Nurse made aware of PTAR cancellation.

## 2024-02-15 NOTE — ED Notes (Signed)
 PTAR scheduled for the patient to be transported to [facility].  ETA is within 30 minutes, per dispatch.

## 2024-02-15 NOTE — ED Notes (Signed)
 PTAR has been scheduled for the patient to be transported to [facility].  Per dispatch, transport should arrive within the hour. ETA was reported to the nurse on duty.

## 2024-02-15 NOTE — Progress Notes (Addendum)
 8am: Patient will go to room 3206 at [facility] via PTAR - RN to call when ready. The number to call for report is [phone], ext [phone].  CSW returned call to patient's sister [name] to inform her of updates.  CSW informed RN of information.  7:48am: Patient's insurance authorization was approved #[id number], next review date is 02/17/24.  CSW sent message to Sallyanne Creamer at [facility] to inform her of information.  7:30am: CSW spoke with patient's sister [name] to present her with bed offers. [name] states she wants to accept offer from [facility].  CSW will initiate insurance authorization.  Shepard Dicker, MSW, LCSW Transitions of Care  Clinical Social Worker II [phone]

## 2024-02-15 NOTE — ED Provider Notes (Addendum)
 Emergency Medicine Observation Re-evaluation Note  Kathy Owens is a 68 y.o. female, seen on rounds today.  Pt initially presented to the ED for complaints of Weakness Currently, the patient is resting in bed without acute agitation, getting ready to eat breakfast shortly.  Physical Exam  BP 119/75 (BP Location: Right Arm)   Pulse 61   Temp 97.9 F (36.6 C) (Oral)   Resp 16   Ht 5' (1.524 m)   Wt 72.6 kg   SpO2 100%   BMI 31.25 kg/m  Physical Exam General: Resting in without distress Cardiac: No murmur on my exam Lungs: Clear breath sounds bilaterally on auscultation Psych: No agitation  ED Course / MDM  EKG:EKG Interpretation Date/Time:  Saturday February 13 2024 15:48:14 EDT Ventricular Rate:  61 PR Interval:  196 QRS Duration:  97 QT Interval:  425 QTC Calculation: 429 R Axis:   -6  Text Interpretation: Sinus rhythm Low voltage, precordial leads Confirmed by Afton Horse [phone]) on 02/13/2024 7:34:55 PM  I have reviewed the labs performed to date as well as medications administered while in observation.  Recent changes in the last 24 hours include none reported by overnight nursing.  Plan  Current plan is for per transitional care team, plan of care is to place her at [facility] later today.  Anticipate discharge to [facility]..    Orbie Grupe, Marine Sia, MD 02/15/24 [phone]   8:28 AM Social work team confirmed she is ready for discharge to go to [place] for placement.  Will get her discharge printed.   Geneive Sandstrom, Marine Sia, MD 02/15/24 [phone]

## 2024-02-16 DIAGNOSIS — K828 Other specified diseases of gallbladder: Secondary | ICD-10-CM | POA: Diagnosis not present

## 2024-02-16 DIAGNOSIS — R252 Cramp and spasm: Secondary | ICD-10-CM | POA: Diagnosis not present

## 2024-02-16 DIAGNOSIS — H47013 Ischemic optic neuropathy, bilateral: Secondary | ICD-10-CM | POA: Diagnosis not present

## 2024-02-16 DIAGNOSIS — I10 Essential (primary) hypertension: Secondary | ICD-10-CM | POA: Diagnosis not present

## 2024-02-16 DIAGNOSIS — G809 Cerebral palsy, unspecified: Secondary | ICD-10-CM | POA: Diagnosis not present

## 2024-02-16 DIAGNOSIS — E739 Lactose intolerance, unspecified: Secondary | ICD-10-CM | POA: Diagnosis not present

## 2024-02-16 DIAGNOSIS — E785 Hyperlipidemia, unspecified: Secondary | ICD-10-CM | POA: Diagnosis not present

## 2024-02-16 DIAGNOSIS — M810 Age-related osteoporosis without current pathological fracture: Secondary | ICD-10-CM | POA: Diagnosis not present

## 2024-02-16 DIAGNOSIS — I872 Venous insufficiency (chronic) (peripheral): Secondary | ICD-10-CM | POA: Diagnosis not present

## 2024-02-17 DIAGNOSIS — M810 Age-related osteoporosis without current pathological fracture: Secondary | ICD-10-CM | POA: Diagnosis not present

## 2024-02-17 DIAGNOSIS — E785 Hyperlipidemia, unspecified: Secondary | ICD-10-CM | POA: Diagnosis not present

## 2024-02-17 DIAGNOSIS — I10 Essential (primary) hypertension: Secondary | ICD-10-CM | POA: Diagnosis not present

## 2024-02-17 DIAGNOSIS — R252 Cramp and spasm: Secondary | ICD-10-CM | POA: Diagnosis not present

## 2024-02-17 DIAGNOSIS — H47013 Ischemic optic neuropathy, bilateral: Secondary | ICD-10-CM | POA: Diagnosis not present

## 2024-02-17 DIAGNOSIS — E739 Lactose intolerance, unspecified: Secondary | ICD-10-CM | POA: Diagnosis not present

## 2024-02-17 DIAGNOSIS — G809 Cerebral palsy, unspecified: Secondary | ICD-10-CM | POA: Diagnosis not present

## 2024-02-17 DIAGNOSIS — I872 Venous insufficiency (chronic) (peripheral): Secondary | ICD-10-CM | POA: Diagnosis not present

## 2024-02-17 DIAGNOSIS — K828 Other specified diseases of gallbladder: Secondary | ICD-10-CM | POA: Diagnosis not present

## 2024-02-18 DIAGNOSIS — I872 Venous insufficiency (chronic) (peripheral): Secondary | ICD-10-CM | POA: Diagnosis not present

## 2024-02-18 DIAGNOSIS — E785 Hyperlipidemia, unspecified: Secondary | ICD-10-CM | POA: Diagnosis not present

## 2024-02-18 DIAGNOSIS — K828 Other specified diseases of gallbladder: Secondary | ICD-10-CM | POA: Diagnosis not present

## 2024-02-18 DIAGNOSIS — H47013 Ischemic optic neuropathy, bilateral: Secondary | ICD-10-CM | POA: Diagnosis not present

## 2024-02-18 DIAGNOSIS — E739 Lactose intolerance, unspecified: Secondary | ICD-10-CM | POA: Diagnosis not present

## 2024-02-18 DIAGNOSIS — G809 Cerebral palsy, unspecified: Secondary | ICD-10-CM | POA: Diagnosis not present

## 2024-02-18 DIAGNOSIS — R252 Cramp and spasm: Secondary | ICD-10-CM | POA: Diagnosis not present

## 2024-02-18 DIAGNOSIS — M810 Age-related osteoporosis without current pathological fracture: Secondary | ICD-10-CM | POA: Diagnosis not present

## 2024-02-18 DIAGNOSIS — I10 Essential (primary) hypertension: Secondary | ICD-10-CM | POA: Diagnosis not present

## 2024-02-19 DIAGNOSIS — G809 Cerebral palsy, unspecified: Secondary | ICD-10-CM | POA: Diagnosis not present

## 2024-02-19 DIAGNOSIS — I10 Essential (primary) hypertension: Secondary | ICD-10-CM | POA: Diagnosis not present

## 2024-02-19 DIAGNOSIS — H47013 Ischemic optic neuropathy, bilateral: Secondary | ICD-10-CM | POA: Diagnosis not present

## 2024-02-19 DIAGNOSIS — E785 Hyperlipidemia, unspecified: Secondary | ICD-10-CM | POA: Diagnosis not present

## 2024-02-19 DIAGNOSIS — M810 Age-related osteoporosis without current pathological fracture: Secondary | ICD-10-CM | POA: Diagnosis not present

## 2024-02-19 DIAGNOSIS — K828 Other specified diseases of gallbladder: Secondary | ICD-10-CM | POA: Diagnosis not present

## 2024-02-19 DIAGNOSIS — E739 Lactose intolerance, unspecified: Secondary | ICD-10-CM | POA: Diagnosis not present

## 2024-02-19 DIAGNOSIS — R252 Cramp and spasm: Secondary | ICD-10-CM | POA: Diagnosis not present

## 2024-02-19 DIAGNOSIS — I872 Venous insufficiency (chronic) (peripheral): Secondary | ICD-10-CM | POA: Diagnosis not present

## 2024-02-22 DIAGNOSIS — I872 Venous insufficiency (chronic) (peripheral): Secondary | ICD-10-CM | POA: Diagnosis not present

## 2024-02-22 DIAGNOSIS — H47013 Ischemic optic neuropathy, bilateral: Secondary | ICD-10-CM | POA: Diagnosis not present

## 2024-02-22 DIAGNOSIS — E739 Lactose intolerance, unspecified: Secondary | ICD-10-CM | POA: Diagnosis not present

## 2024-02-22 DIAGNOSIS — E785 Hyperlipidemia, unspecified: Secondary | ICD-10-CM | POA: Diagnosis not present

## 2024-02-22 DIAGNOSIS — K828 Other specified diseases of gallbladder: Secondary | ICD-10-CM | POA: Diagnosis not present

## 2024-02-22 DIAGNOSIS — I10 Essential (primary) hypertension: Secondary | ICD-10-CM | POA: Diagnosis not present

## 2024-02-22 DIAGNOSIS — M810 Age-related osteoporosis without current pathological fracture: Secondary | ICD-10-CM | POA: Diagnosis not present

## 2024-02-22 DIAGNOSIS — R252 Cramp and spasm: Secondary | ICD-10-CM | POA: Diagnosis not present

## 2024-02-22 DIAGNOSIS — G809 Cerebral palsy, unspecified: Secondary | ICD-10-CM | POA: Diagnosis not present

## 2024-02-23 DIAGNOSIS — R252 Cramp and spasm: Secondary | ICD-10-CM | POA: Diagnosis not present

## 2024-02-23 DIAGNOSIS — E739 Lactose intolerance, unspecified: Secondary | ICD-10-CM | POA: Diagnosis not present

## 2024-02-23 DIAGNOSIS — I10 Essential (primary) hypertension: Secondary | ICD-10-CM | POA: Diagnosis not present

## 2024-02-23 DIAGNOSIS — H47013 Ischemic optic neuropathy, bilateral: Secondary | ICD-10-CM | POA: Diagnosis not present

## 2024-02-23 DIAGNOSIS — K828 Other specified diseases of gallbladder: Secondary | ICD-10-CM | POA: Diagnosis not present

## 2024-02-24 DIAGNOSIS — K828 Other specified diseases of gallbladder: Secondary | ICD-10-CM | POA: Diagnosis not present

## 2024-02-24 DIAGNOSIS — E739 Lactose intolerance, unspecified: Secondary | ICD-10-CM | POA: Diagnosis not present

## 2024-02-24 DIAGNOSIS — R252 Cramp and spasm: Secondary | ICD-10-CM | POA: Diagnosis not present

## 2024-02-24 DIAGNOSIS — H47013 Ischemic optic neuropathy, bilateral: Secondary | ICD-10-CM | POA: Diagnosis not present

## 2024-02-24 DIAGNOSIS — I10 Essential (primary) hypertension: Secondary | ICD-10-CM | POA: Diagnosis not present

## 2024-02-25 DIAGNOSIS — E739 Lactose intolerance, unspecified: Secondary | ICD-10-CM | POA: Diagnosis not present

## 2024-02-25 DIAGNOSIS — M810 Age-related osteoporosis without current pathological fracture: Secondary | ICD-10-CM | POA: Diagnosis not present

## 2024-02-25 DIAGNOSIS — I872 Venous insufficiency (chronic) (peripheral): Secondary | ICD-10-CM | POA: Diagnosis not present

## 2024-02-25 DIAGNOSIS — E785 Hyperlipidemia, unspecified: Secondary | ICD-10-CM | POA: Diagnosis not present

## 2024-02-25 DIAGNOSIS — H47013 Ischemic optic neuropathy, bilateral: Secondary | ICD-10-CM | POA: Diagnosis not present

## 2024-02-25 DIAGNOSIS — R252 Cramp and spasm: Secondary | ICD-10-CM | POA: Diagnosis not present

## 2024-02-25 DIAGNOSIS — G809 Cerebral palsy, unspecified: Secondary | ICD-10-CM | POA: Diagnosis not present

## 2024-02-25 DIAGNOSIS — I10 Essential (primary) hypertension: Secondary | ICD-10-CM | POA: Diagnosis not present

## 2024-02-25 DIAGNOSIS — K828 Other specified diseases of gallbladder: Secondary | ICD-10-CM | POA: Diagnosis not present

## 2024-02-26 DIAGNOSIS — H47013 Ischemic optic neuropathy, bilateral: Secondary | ICD-10-CM | POA: Diagnosis not present

## 2024-02-26 DIAGNOSIS — K828 Other specified diseases of gallbladder: Secondary | ICD-10-CM | POA: Diagnosis not present

## 2024-02-26 DIAGNOSIS — I10 Essential (primary) hypertension: Secondary | ICD-10-CM | POA: Diagnosis not present

## 2024-02-26 DIAGNOSIS — E785 Hyperlipidemia, unspecified: Secondary | ICD-10-CM | POA: Diagnosis not present

## 2024-02-26 DIAGNOSIS — I872 Venous insufficiency (chronic) (peripheral): Secondary | ICD-10-CM | POA: Diagnosis not present

## 2024-02-26 DIAGNOSIS — M810 Age-related osteoporosis without current pathological fracture: Secondary | ICD-10-CM | POA: Diagnosis not present

## 2024-02-26 DIAGNOSIS — R252 Cramp and spasm: Secondary | ICD-10-CM | POA: Diagnosis not present

## 2024-02-26 DIAGNOSIS — G809 Cerebral palsy, unspecified: Secondary | ICD-10-CM | POA: Diagnosis not present

## 2024-02-26 DIAGNOSIS — E739 Lactose intolerance, unspecified: Secondary | ICD-10-CM | POA: Diagnosis not present

## 2024-02-29 DIAGNOSIS — R252 Cramp and spasm: Secondary | ICD-10-CM | POA: Diagnosis not present

## 2024-02-29 DIAGNOSIS — K828 Other specified diseases of gallbladder: Secondary | ICD-10-CM | POA: Diagnosis not present

## 2024-02-29 DIAGNOSIS — E739 Lactose intolerance, unspecified: Secondary | ICD-10-CM | POA: Diagnosis not present

## 2024-02-29 DIAGNOSIS — I10 Essential (primary) hypertension: Secondary | ICD-10-CM | POA: Diagnosis not present

## 2024-02-29 DIAGNOSIS — H47013 Ischemic optic neuropathy, bilateral: Secondary | ICD-10-CM | POA: Diagnosis not present

## 2024-03-02 DIAGNOSIS — I10 Essential (primary) hypertension: Secondary | ICD-10-CM | POA: Diagnosis not present

## 2024-03-02 DIAGNOSIS — K828 Other specified diseases of gallbladder: Secondary | ICD-10-CM | POA: Diagnosis not present

## 2024-03-02 DIAGNOSIS — M810 Age-related osteoporosis without current pathological fracture: Secondary | ICD-10-CM | POA: Diagnosis not present

## 2024-03-02 DIAGNOSIS — R252 Cramp and spasm: Secondary | ICD-10-CM | POA: Diagnosis not present

## 2024-03-02 DIAGNOSIS — E785 Hyperlipidemia, unspecified: Secondary | ICD-10-CM | POA: Diagnosis not present

## 2024-03-02 DIAGNOSIS — H47013 Ischemic optic neuropathy, bilateral: Secondary | ICD-10-CM | POA: Diagnosis not present

## 2024-03-02 DIAGNOSIS — G809 Cerebral palsy, unspecified: Secondary | ICD-10-CM | POA: Diagnosis not present

## 2024-03-02 DIAGNOSIS — I872 Venous insufficiency (chronic) (peripheral): Secondary | ICD-10-CM | POA: Diagnosis not present

## 2024-03-02 DIAGNOSIS — E739 Lactose intolerance, unspecified: Secondary | ICD-10-CM | POA: Diagnosis not present

## 2024-03-04 DIAGNOSIS — E739 Lactose intolerance, unspecified: Secondary | ICD-10-CM | POA: Diagnosis not present

## 2024-03-04 DIAGNOSIS — H47013 Ischemic optic neuropathy, bilateral: Secondary | ICD-10-CM | POA: Diagnosis not present

## 2024-03-04 DIAGNOSIS — I872 Venous insufficiency (chronic) (peripheral): Secondary | ICD-10-CM | POA: Diagnosis not present

## 2024-03-04 DIAGNOSIS — M810 Age-related osteoporosis without current pathological fracture: Secondary | ICD-10-CM | POA: Diagnosis not present

## 2024-03-04 DIAGNOSIS — K828 Other specified diseases of gallbladder: Secondary | ICD-10-CM | POA: Diagnosis not present

## 2024-03-04 DIAGNOSIS — I10 Essential (primary) hypertension: Secondary | ICD-10-CM | POA: Diagnosis not present

## 2024-03-04 DIAGNOSIS — R252 Cramp and spasm: Secondary | ICD-10-CM | POA: Diagnosis not present

## 2024-03-04 DIAGNOSIS — G809 Cerebral palsy, unspecified: Secondary | ICD-10-CM | POA: Diagnosis not present

## 2024-03-04 DIAGNOSIS — E785 Hyperlipidemia, unspecified: Secondary | ICD-10-CM | POA: Diagnosis not present

## 2024-03-04 NOTE — Progress Notes (Signed)
 My response to the following coding query: Please clarify the medical condition(s) necessitating the order for SARS,RSV,Flu A&B   Evaluate for viral illness

## 2024-03-07 DIAGNOSIS — K828 Other specified diseases of gallbladder: Secondary | ICD-10-CM | POA: Diagnosis not present

## 2024-03-07 DIAGNOSIS — H47013 Ischemic optic neuropathy, bilateral: Secondary | ICD-10-CM | POA: Diagnosis not present

## 2024-03-07 DIAGNOSIS — R252 Cramp and spasm: Secondary | ICD-10-CM | POA: Diagnosis not present

## 2024-03-07 DIAGNOSIS — E739 Lactose intolerance, unspecified: Secondary | ICD-10-CM | POA: Diagnosis not present

## 2024-03-07 DIAGNOSIS — I10 Essential (primary) hypertension: Secondary | ICD-10-CM | POA: Diagnosis not present

## 2024-03-07 DIAGNOSIS — G809 Cerebral palsy, unspecified: Secondary | ICD-10-CM | POA: Diagnosis not present

## 2024-03-07 DIAGNOSIS — E785 Hyperlipidemia, unspecified: Secondary | ICD-10-CM | POA: Diagnosis not present

## 2024-03-07 DIAGNOSIS — I872 Venous insufficiency (chronic) (peripheral): Secondary | ICD-10-CM | POA: Diagnosis not present

## 2024-03-07 DIAGNOSIS — M810 Age-related osteoporosis without current pathological fracture: Secondary | ICD-10-CM | POA: Diagnosis not present

## 2024-03-09 DIAGNOSIS — H47013 Ischemic optic neuropathy, bilateral: Secondary | ICD-10-CM | POA: Diagnosis not present

## 2024-03-09 DIAGNOSIS — E785 Hyperlipidemia, unspecified: Secondary | ICD-10-CM | POA: Diagnosis not present

## 2024-03-09 DIAGNOSIS — K828 Other specified diseases of gallbladder: Secondary | ICD-10-CM | POA: Diagnosis not present

## 2024-03-09 DIAGNOSIS — R252 Cramp and spasm: Secondary | ICD-10-CM | POA: Diagnosis not present

## 2024-03-09 DIAGNOSIS — E739 Lactose intolerance, unspecified: Secondary | ICD-10-CM | POA: Diagnosis not present

## 2024-03-09 DIAGNOSIS — I10 Essential (primary) hypertension: Secondary | ICD-10-CM | POA: Diagnosis not present

## 2024-03-09 DIAGNOSIS — I872 Venous insufficiency (chronic) (peripheral): Secondary | ICD-10-CM | POA: Diagnosis not present

## 2024-03-09 DIAGNOSIS — M810 Age-related osteoporosis without current pathological fracture: Secondary | ICD-10-CM | POA: Diagnosis not present

## 2024-03-09 DIAGNOSIS — G809 Cerebral palsy, unspecified: Secondary | ICD-10-CM | POA: Diagnosis not present

## 2024-03-11 ENCOUNTER — Telehealth: Payer: Self-pay

## 2024-03-11 DIAGNOSIS — G809 Cerebral palsy, unspecified: Secondary | ICD-10-CM | POA: Diagnosis not present

## 2024-03-11 DIAGNOSIS — E785 Hyperlipidemia, unspecified: Secondary | ICD-10-CM | POA: Diagnosis not present

## 2024-03-11 DIAGNOSIS — M810 Age-related osteoporosis without current pathological fracture: Secondary | ICD-10-CM | POA: Diagnosis not present

## 2024-03-11 DIAGNOSIS — I10 Essential (primary) hypertension: Secondary | ICD-10-CM | POA: Diagnosis not present

## 2024-03-11 DIAGNOSIS — R252 Cramp and spasm: Secondary | ICD-10-CM | POA: Diagnosis not present

## 2024-03-11 DIAGNOSIS — E739 Lactose intolerance, unspecified: Secondary | ICD-10-CM | POA: Diagnosis not present

## 2024-03-11 DIAGNOSIS — K828 Other specified diseases of gallbladder: Secondary | ICD-10-CM | POA: Diagnosis not present

## 2024-03-11 DIAGNOSIS — I872 Venous insufficiency (chronic) (peripheral): Secondary | ICD-10-CM | POA: Diagnosis not present

## 2024-03-11 DIAGNOSIS — H47013 Ischemic optic neuropathy, bilateral: Secondary | ICD-10-CM | POA: Diagnosis not present

## 2024-03-11 NOTE — Telephone Encounter (Signed)
 Valinda Gault RN with [facility] is seeking a referral for a Home Health evaluation. Patient was recently released from [facility].   Call back phone [phone].

## 2024-03-15 ENCOUNTER — Other Ambulatory Visit: Payer: Self-pay | Admitting: *Deleted

## 2024-03-15 DIAGNOSIS — G894 Chronic pain syndrome: Secondary | ICD-10-CM

## 2024-03-15 MED ORDER — HYDROCODONE-ACETAMINOPHEN 5-325 MG PO TABS: 1.0000 | ORAL_TABLET | Freq: Four times a day (QID) | ORAL | 0 refills | Status: DC | PRN

## 2024-03-15 NOTE — Telephone Encounter (Signed)
 RTC to [name] ar [facility].  Message was left for [name] to call the Clinics on her return.  Copied from CRM [id number]. Topic: Referral - Request for Referral >> Mar 11, 2024  3:49 PM Retta Caster wrote: Did the patient discuss referral with their provider in the last year? Yes (If No - schedule appointment) (If Yes - send message)  Appointment offered? No  Type of order/referral and detailed reason for visit: [name] from [facility] stated patient in facility in May and just discharged last week. Request to resume home health with patient. Needs call back [phone]  Preference of office, provider, location: N/A  If referral order, have you been seen by this specialty before? Yes (If Yes, this issue or another issue? When? N/A Where? Home Health   Can we respond through MyChart? Yes

## 2024-03-15 NOTE — Telephone Encounter (Signed)
 Copied from CRM [id number]. Topic: Clinical - Medication Refill >> Mar 15, 2024  9:31 AM Retta Caster wrote: Medication: HYDROcodone -acetaminophen  (NORCO/VICODIN) 5-325 MG tablet   Has the patient contacted their pharmacy? Yes (Agent: If no, request that the patient contact the pharmacy for the refill. If patient does not wish to contact the pharmacy document the reason why and proceed with request.) (Agent: If yes, when and what did the pharmacy advise?)  This is the patient's preferred pharmacy:  [id number] - [place], [place] - [address] AT [facility] OF [address] & [address] [place] [address] Phone: [phone] Fax: [phone]  Is this the correct pharmacy for this prescription? Yes If no, delete pharmacy and type the correct one.   Has the prescription been filled recently? Yes  Is the patient out of the medication? Yes  Has the patient been seen for an appointment in the last year OR does the patient have an upcoming appointment? Yes  Can we respond through MyChart? Yes  Agent: Please be advised that Rx refills may take up to 3 business days. We ask that you follow-up with your pharmacy.

## 2024-03-16 ENCOUNTER — Encounter: Attending: Physical Medicine and Rehabilitation | Admitting: Physical Medicine and Rehabilitation

## 2024-03-16 DIAGNOSIS — G809 Cerebral palsy, unspecified: Secondary | ICD-10-CM | POA: Insufficient documentation

## 2024-03-16 DIAGNOSIS — E538 Deficiency of other specified B group vitamins: Secondary | ICD-10-CM | POA: Diagnosis not present

## 2024-03-16 DIAGNOSIS — G800 Spastic quadriplegic cerebral palsy: Secondary | ICD-10-CM | POA: Diagnosis not present

## 2024-03-16 DIAGNOSIS — Z7982 Long term (current) use of aspirin: Secondary | ICD-10-CM | POA: Diagnosis not present

## 2024-03-16 DIAGNOSIS — I130 Hypertensive heart and chronic kidney disease with heart failure and stage 1 through stage 4 chronic kidney disease, or unspecified chronic kidney disease: Secondary | ICD-10-CM | POA: Diagnosis not present

## 2024-03-16 DIAGNOSIS — E785 Hyperlipidemia, unspecified: Secondary | ICD-10-CM | POA: Diagnosis not present

## 2024-03-16 DIAGNOSIS — Z79891 Long term (current) use of opiate analgesic: Secondary | ICD-10-CM | POA: Diagnosis not present

## 2024-03-16 DIAGNOSIS — I872 Venous insufficiency (chronic) (peripheral): Secondary | ICD-10-CM | POA: Diagnosis not present

## 2024-03-16 DIAGNOSIS — I509 Heart failure, unspecified: Secondary | ICD-10-CM | POA: Diagnosis not present

## 2024-03-16 DIAGNOSIS — M810 Age-related osteoporosis without current pathological fracture: Secondary | ICD-10-CM | POA: Diagnosis not present

## 2024-03-16 DIAGNOSIS — N183 Chronic kidney disease, stage 3 unspecified: Secondary | ICD-10-CM | POA: Diagnosis not present

## 2024-03-16 DIAGNOSIS — Z604 Social exclusion and rejection: Secondary | ICD-10-CM | POA: Diagnosis not present

## 2024-03-16 DIAGNOSIS — K219 Gastro-esophageal reflux disease without esophagitis: Secondary | ICD-10-CM | POA: Diagnosis not present

## 2024-03-16 DIAGNOSIS — R252 Cramp and spasm: Secondary | ICD-10-CM | POA: Insufficient documentation

## 2024-03-16 NOTE — Telephone Encounter (Signed)
 I asked that pt's referral be gotten from PCP- since patient doesn't want to come back to see me- she said, because has refused therapy, braces, and cannot get shoes due to cost-
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
## 2024-03-21 DIAGNOSIS — G800 Spastic quadriplegic cerebral palsy: Secondary | ICD-10-CM | POA: Diagnosis not present

## 2024-03-21 DIAGNOSIS — I872 Venous insufficiency (chronic) (peripheral): Secondary | ICD-10-CM | POA: Diagnosis not present

## 2024-03-21 DIAGNOSIS — Z7982 Long term (current) use of aspirin: Secondary | ICD-10-CM | POA: Diagnosis not present

## 2024-03-21 DIAGNOSIS — I509 Heart failure, unspecified: Secondary | ICD-10-CM | POA: Diagnosis not present

## 2024-03-21 DIAGNOSIS — N183 Chronic kidney disease, stage 3 unspecified: Secondary | ICD-10-CM | POA: Diagnosis not present

## 2024-03-21 DIAGNOSIS — Z79891 Long term (current) use of opiate analgesic: Secondary | ICD-10-CM | POA: Diagnosis not present

## 2024-03-21 DIAGNOSIS — K219 Gastro-esophageal reflux disease without esophagitis: Secondary | ICD-10-CM | POA: Diagnosis not present

## 2024-03-21 DIAGNOSIS — M810 Age-related osteoporosis without current pathological fracture: Secondary | ICD-10-CM | POA: Diagnosis not present

## 2024-03-21 DIAGNOSIS — E785 Hyperlipidemia, unspecified: Secondary | ICD-10-CM | POA: Diagnosis not present

## 2024-03-21 DIAGNOSIS — I130 Hypertensive heart and chronic kidney disease with heart failure and stage 1 through stage 4 chronic kidney disease, or unspecified chronic kidney disease: Secondary | ICD-10-CM | POA: Diagnosis not present

## 2024-03-21 DIAGNOSIS — E538 Deficiency of other specified B group vitamins: Secondary | ICD-10-CM | POA: Diagnosis not present

## 2024-03-21 DIAGNOSIS — Z604 Social exclusion and rejection: Secondary | ICD-10-CM | POA: Diagnosis not present

## 2024-03-24 ENCOUNTER — Emergency Department (HOSPITAL_COMMUNITY)
Admission: EM | Admit: 2024-03-24 | Discharge: 2024-03-25 | Disposition: A | Discharge status: Home / Self Care | Attending: Emergency Medicine | Admitting: Emergency Medicine

## 2024-03-24 ENCOUNTER — Encounter (HOSPITAL_COMMUNITY): Payer: Self-pay

## 2024-03-24 ENCOUNTER — Ambulatory Visit: Admitting: Student

## 2024-03-24 ENCOUNTER — Ambulatory Visit: Payer: Self-pay

## 2024-03-24 ENCOUNTER — Other Ambulatory Visit: Payer: Self-pay

## 2024-03-24 DIAGNOSIS — R197 Diarrhea, unspecified: Secondary | ICD-10-CM | POA: Insufficient documentation

## 2024-03-24 DIAGNOSIS — R6889 Other general symptoms and signs: Secondary | ICD-10-CM | POA: Diagnosis not present

## 2024-03-24 DIAGNOSIS — R609 Edema, unspecified: Secondary | ICD-10-CM | POA: Diagnosis not present

## 2024-03-24 DIAGNOSIS — R109 Unspecified abdominal pain: Secondary | ICD-10-CM | POA: Diagnosis not present

## 2024-03-24 DIAGNOSIS — E876 Hypokalemia: Secondary | ICD-10-CM | POA: Insufficient documentation

## 2024-03-24 DIAGNOSIS — K529 Noninfective gastroenteritis and colitis, unspecified: Secondary | ICD-10-CM

## 2024-03-24 DIAGNOSIS — I129 Hypertensive chronic kidney disease with stage 1 through stage 4 chronic kidney disease, or unspecified chronic kidney disease: Secondary | ICD-10-CM | POA: Diagnosis not present

## 2024-03-24 DIAGNOSIS — N189 Chronic kidney disease, unspecified: Secondary | ICD-10-CM | POA: Insufficient documentation

## 2024-03-24 DIAGNOSIS — G4489 Other headache syndrome: Secondary | ICD-10-CM | POA: Diagnosis not present

## 2024-03-24 HISTORY — DX: Noninfective gastroenteritis and colitis, unspecified: K52.9

## 2024-03-24 LAB — COMPREHENSIVE METABOLIC PANEL WITH GFR
ALT: 26 U/L (ref 0–44)
AST: 27 U/L (ref 15–41)
Albumin: 4.2 g/dL (ref 3.5–5.0)
Alkaline Phosphatase: 69 U/L (ref 38–126)
Anion gap: 13 (ref 5–15)
BUN: 30 mg/dL — ABNORMAL HIGH (ref 8–23)
CO2: 23 mmol/L (ref 22–32)
Calcium: 9.2 mg/dL (ref 8.9–10.3)
Chloride: 101 mmol/L (ref 98–111)
Creatinine, Ser: 0.96 mg/dL (ref 0.44–1.00)
GFR, Estimated: >60 mL/min (ref >60)
Glucose, Bld: 110 mg/dL — ABNORMAL HIGH (ref 70–99)
Potassium: 3 mmol/L — ABNORMAL LOW (ref 3.5–5.1)
Sodium: 137 mmol/L (ref 135–145)
Total Bilirubin: 0.9 mg/dL (ref 0.0–1.2)
Total Protein: 7.7 g/dL (ref 6.5–8.1)

## 2024-03-24 LAB — CBC
HCT: 41.8 % (ref 36.0–46.0)
Hemoglobin: 13.4 g/dL (ref 12.0–15.0)
MCH: 29.2 pg (ref 26.0–34.0)
MCHC: 32.1 g/dL (ref 30.0–36.0)
MCV: 91.1 fL (ref 80.0–100.0)
Platelets: 277 10*3/uL (ref 150–400)
RBC: 4.59 MIL/uL (ref 3.87–5.11)
RDW: 13.8 % (ref 11.5–15.5)
WBC: 8.6 10*3/uL (ref 4.0–10.5)
nRBC: 0 % (ref 0.0–0.2)

## 2024-03-24 LAB — LIPASE, BLOOD: Lipase: 25 U/L (ref 11–51)

## 2024-03-24 MED ORDER — POTASSIUM CHLORIDE CRYS ER 20 MEQ PO TBCR
40.0000 meq | EXTENDED_RELEASE_TABLET | Freq: Once | ORAL | Status: AC
  Administered 2024-03-25: 40 meq via ORAL
  Filled 2024-03-24 (×2): qty 2

## 2024-03-24 NOTE — Telephone Encounter (Signed)
 Return pt's call who stated she has been having diarrhea for 2 weeks; liquid stools. She has taken Imodium  which has not helped.Has hx of C Diff. Stated she started taking Flexeril about 3 weeks ago and stopped it 2 days ago b/c she thinks it may be causing the diarrhea. She does not have transportation to come in for an appt. Telehealth appt given this am with Dr Duncan Gibson @ 1045.
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
## 2024-03-24 NOTE — ED Provider Triage Note (Signed)
 Emergency Medicine Provider Triage Evaluation Note  Kathy Owens , a 68 y.o. female  was evaluated in triage.  Pt complains of mild abdominal pain and diarrhea x 2 weeks. Patient denies fever/chills. Denies chest pain or shortness of breath.   Review of Systems  Positive: Abdominal pain, diarrhea Negative: Urinary symptoms, chest pain, shortness of breath, confusion  Physical Exam  BP 136/71 (BP Location: Left Arm)   Pulse 81   Temp 98 F (36.7 C) (Oral)   Resp 18   SpO2 99%  Gen:   Awake, no distress   Resp:  Normal effort, lungs sound clear  MSK:   Moves extremities without difficulty  Other:  Mild low abdominal tenderness subjective, no rebound or guarding, no CVA tenderness   Medical Decision Making  Medically screening exam initiated at 6:45 PM.  Appropriate orders placed.  Kathy Owens was informed that the remainder of the evaluation will be completed by another provider, this initial triage assessment does not replace that evaluation, and the importance of remaining in the ED until their evaluation is complete.  Orders include CBC, CMP, lipase, UA, diet NPO    Kathy Hymen, PA-C 03/24/24 [phone]
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
## 2024-03-24 NOTE — Telephone Encounter (Signed)
 Reason for Disposition  [1] SEVERE diarrhea (e.g., 7 or more times / day more than normal) AND [2] age > 60 years  Answer Assessment - Initial Assessment Questions 1. DIARRHEA SEVERITY: "How bad is the diarrhea?" "How many more stools have you had in the past 24 hours than normal?"    - NO DIARRHEA (SCALE 0)   - MILD (SCALE 1-3): Few loose or mushy BMs; increase of 1-3 stools over normal daily number of stools; mild increase in ostomy output.   -  MODERATE (SCALE 4-7): Increase of 4-6 stools daily over normal; moderate increase in ostomy output.   -  SEVERE (SCALE 8-10; OR "WORST POSSIBLE"): Increase of 7 or more stools daily over normal; moderate increase in ostomy output; incontinence.     Not sure  2. ONSET: "When did the diarrhea begin?"      2 weeks  3. BM CONSISTENCY: "How loose or watery is the diarrhea?"      Watery  4. VOMITING: "Are you also vomiting?" If Yes, ask: "How many times in the past 24 hours?"      Denies  5. ABDOMEN PAIN: "Are you having any abdomen pain?" If Yes, ask: "What does it feel like?" (e.g., crampy, dull, intermittent, constant)      Denies  6. ABDOMEN PAIN SEVERITY: If present, ask: "How bad is the pain?"  (e.g., Scale 1-10; mild, moderate, or severe)   - MILD (1-3): doesn't interfere with normal activities, abdomen soft and not tender to touch    - MODERATE (4-7): interferes with normal activities or awakens from sleep, abdomen tender to touch    - SEVERE (8-10): excruciating pain, doubled over, unable to do any normal activities       Denies  7. ORAL INTAKE: If vomiting, "Have you been able to drink liquids?" "How much liquids have you had in the past 24 hours?"     5 glasses a day  8. HYDRATION: "Any signs of dehydration?" (e.g., dry mouth [not just dry lips], too weak to stand, dizziness, new weight loss) "When did you last urinate?"     Dry mouth 9. EXPOSURE: "Have you traveled to a foreign country recently?" "Have you been exposed to anyone with  diarrhea?" "Could you have eaten any food that was spoiled?"     Na  10. ANTIBIOTIC USE: "Are you taking antibiotics now or have you taken antibiotics in the past 2 months?"       Denies  11. OTHER SYMPTOMS: "Do you have any other symptoms?" (e.g., fever, blood in stool)       Denies     Pt feels Flexeril is causing diarrhea but has been off  medication for 2 days. Hx of C Dif  Protocols used: Diarrhea-A-AH

## 2024-03-24 NOTE — Assessment & Plan Note (Addendum)
 I spoke with Kathy Owens about her diarrhea over the phone.  She says it has been going on for 2 weeks.  The diarrhea occurs 3-4 times per day, but she notes that she had several episodes last night that were more frequent than normal.  Her diarrhea is liquid consistency, like water.  She denies any abdominal pain, nausea or vomiting.  She denies any lightheadedness or dizziness, fevers or chills.  No blood in the stool.  She does have a history of C. difficile colitis back in 2019 requiring hospitalization.  I have a relatively low suspicion for this given her lack of systemic symptoms, however do feel it needs to be ruled out.  I am also worried about her kidney function, electrolyes, and blood pressure in the setting of significant GI losses.  She is severely limited in her mobility by her cerebral palsy.  She says that she is unable to get into normal cars due to her spasticity.  Her sister, [name], lives nearby but she is unable to fit in her SUV.  She does not have a home health nurse that is able to draw labs or collect the stool sample.  She says there is no one that is able to transport her to the clinic.  I feel it is important for her to be evaluated in person and for labs to be collected, however given her physical limitations, this is a challenge.  In the absence of other options, I have recommended she call an ambulance to transport her to the emergency department for further evaluation.

## 2024-03-24 NOTE — Telephone Encounter (Signed)
 FYI Only or Action Required?: Action required by provider  Patient was last seen in primary care on 10/28/2023 by Jayson Michael, MD. Called Nurse Triage reporting Diarrhea. Symptoms began several weeks ago. Interventions attempted: Nothing. Symptoms are: gradually worsening.  Triage Disposition: See HCP Within 4 Hours (Or PCP Triage)  Patient/caregiver understands and will follow disposition?: Yes    Pt would not make appt due to transportation.Please advise.

## 2024-03-24 NOTE — ED Triage Notes (Signed)
 Pt came in for diarrhea for two weeks. Pt is 3-4 episodes/day and has not been eating as much. Pt is still staying hydrated.  Hx C diff

## 2024-03-25 ENCOUNTER — Ambulatory Visit: Payer: Self-pay

## 2024-03-25 DIAGNOSIS — Z743 Need for continuous supervision: Secondary | ICD-10-CM | POA: Diagnosis not present

## 2024-03-25 DIAGNOSIS — R531 Weakness: Secondary | ICD-10-CM | POA: Diagnosis not present

## 2024-03-25 DIAGNOSIS — R197 Diarrhea, unspecified: Secondary | ICD-10-CM | POA: Diagnosis not present

## 2024-03-25 LAB — URINALYSIS, ROUTINE W REFLEX MICROSCOPIC
Bilirubin Urine: NEGATIVE
Glucose, UA: NEGATIVE mg/dL
Ketones, ur: NEGATIVE mg/dL
Nitrite: NEGATIVE
Protein, ur: NEGATIVE mg/dL
Specific Gravity, Urine: 1.005 (ref 1.005–1.030)
pH: 6 (ref 5.0–8.0)

## 2024-03-25 LAB — GASTROINTESTINAL PANEL BY PCR, STOOL (REPLACES STOOL CULTURE)

## 2024-03-25 LAB — C DIFFICILE QUICK SCREEN W PCR REFLEX
C Diff antigen: NEGATIVE
C Diff interpretation: NOT DETECTED
C Diff toxin: NEGATIVE

## 2024-03-25 NOTE — Progress Notes (Signed)
 Internal Medicine Clinic Attending  Case discussed with the resident at the time of the visit.  We reviewed the resident's history and exam and pertinent patient test results.  I agree with the assessment, diagnosis, and plan of care documented in the resident's note.

## 2024-03-25 NOTE — Discharge Instructions (Signed)
 You were evaluated in the Emergency Department and after careful evaluation, we did not find any emergent condition requiring admission or further testing in the hospital.  Your exam/testing today is overall reassuring.  Recommend follow-up with your primary care doctor to discuss your symptoms.  They can follow-up on your GI pathogen testing.  Your C. difficile testing was negative.  Please return to the Emergency Department if you experience any worsening of your condition.   Thank you for allowing us  to be a part of your care.

## 2024-03-25 NOTE — Telephone Encounter (Signed)
 I return pt's call. Pt stated she went to [facility] ER yesterday; C Diff negative. She stated she still having diarrhea and requesting a stronger medication beside Loperamide  in which she stated is not working. Stated she was not prescribed any med by the ER doctor.  I told her I will inform her doctor.

## 2024-03-25 NOTE — ED Notes (Signed)
 [facility] called and transport arranged.

## 2024-03-25 NOTE — Telephone Encounter (Signed)
 FYI Only or Action Required?: Action required by provider  Patient was last seen in primary care on 03/24/2024 by Kathy Gavia, MD. Called Nurse Triage reporting Diarrhea. Symptoms began several weeks ago. Interventions attempted: OTC medications: Immodium with no improvement and Rest, hydration, or home remedies. Symptoms are: unchanged.  Triage Disposition: See Physician Within 24 Hours  Patient/caregiver understands and will follow disposition?: No, wishes to speak with PCP               Copied from CRM [id number]. Topic: Clinical - Red Word Triage >> Mar 25, 2024  9:19 AM Kathy Owens wrote: Red Word that prompted transfer to Nurse Triage: diarrhea, running down leg. Went to ER along with other issues but they fail to address the diarrhea Reason for Disposition  [1] Recent hospitalization AND [2] diarrhea present > 3 days  Answer Assessment - Initial Assessment Questions 1. DIARRHEA SEVERITY: "How bad is the diarrhea?" "How many more stools have you had in the past 24 hours than normal?"    - NO DIARRHEA (SCALE 0)   - MILD (SCALE 1-3): Few loose or mushy BMs; increase of 1-3 stools over normal daily number of stools; mild increase in ostomy output.   -  MODERATE (SCALE 4-7): Increase of 4-6 stools daily over normal; moderate increase in ostomy output.   -  SEVERE (SCALE 8-10; OR "WORST POSSIBLE"): Increase of 7 or more stools daily over normal; moderate increase in ostomy output; incontinence.     4 2. ONSET: "When did the diarrhea begin?"      X 2 weeks 3. BM CONSISTENCY: "How loose or watery is the diarrhea?"      Watery 4. VOMITING: "Are you also vomiting?" If Yes, ask: "How many times in the past 24 hours?"      None 5. ABDOMEN PAIN: "Are you having any abdomen pain?" If Yes, ask: "What does it feel like?" (e.Owens., crampy, dull, intermittent, constant)      None  7. ORAL INTAKE: If vomiting, "Have you been able to drink liquids?" "How much liquids have you had in the  past 24 hours?"     Eating and drinking, normally, reduced appetite 8. HYDRATION: "Any signs of dehydration?" (e.Owens., dry mouth [not just dry lips], too weak to stand, dizziness, new weight loss) "When did you last urinate?"     Dry mouth,  increasing fluids 10. ANTIBIOTIC USE: "Are you taking antibiotics now or have you taken antibiotics in the past 2 months?"       None 11. OTHER SYMPTOMS: "Do you have any other symptoms?" (e.Owens., fever, blood in stool)       None  Protocols used: Diarrhea-A-AH

## 2024-03-25 NOTE — Addendum Note (Signed)
 Addended by: Bevelyn Bryant on: 03/25/2024 09:57 AM   Modules accepted: Level of Service

## 2024-03-27 ENCOUNTER — Emergency Department (HOSPITAL_COMMUNITY)

## 2024-03-27 ENCOUNTER — Encounter (HOSPITAL_COMMUNITY): Payer: Self-pay | Admitting: Emergency Medicine

## 2024-03-27 ENCOUNTER — Emergency Department (HOSPITAL_COMMUNITY)
Admission: EM | Admit: 2024-03-27 | Discharge: 2024-03-27 | Disposition: A | Discharge status: Home / Self Care | Attending: Emergency Medicine | Admitting: Emergency Medicine

## 2024-03-27 DIAGNOSIS — R1031 Right lower quadrant pain: Secondary | ICD-10-CM | POA: Insufficient documentation

## 2024-03-27 DIAGNOSIS — N281 Cyst of kidney, acquired: Secondary | ICD-10-CM | POA: Diagnosis not present

## 2024-03-27 DIAGNOSIS — Z743 Need for continuous supervision: Secondary | ICD-10-CM | POA: Diagnosis not present

## 2024-03-27 DIAGNOSIS — R6889 Other general symptoms and signs: Secondary | ICD-10-CM | POA: Diagnosis not present

## 2024-03-27 DIAGNOSIS — R197 Diarrhea, unspecified: Secondary | ICD-10-CM | POA: Insufficient documentation

## 2024-03-27 DIAGNOSIS — R531 Weakness: Secondary | ICD-10-CM | POA: Diagnosis not present

## 2024-03-27 DIAGNOSIS — K8020 Calculus of gallbladder without cholecystitis without obstruction: Secondary | ICD-10-CM | POA: Diagnosis not present

## 2024-03-27 DIAGNOSIS — K449 Diaphragmatic hernia without obstruction or gangrene: Secondary | ICD-10-CM | POA: Diagnosis not present

## 2024-03-27 LAB — COMPREHENSIVE METABOLIC PANEL WITH GFR
ALT: 19 U/L (ref 0–44)
AST: 20 U/L (ref 15–41)
Albumin: 3.6 g/dL (ref 3.5–5.0)
Alkaline Phosphatase: 51 U/L (ref 38–126)
Anion gap: 9 (ref 5–15)
BUN: 23 mg/dL (ref 8–23)
CO2: 22 mmol/L (ref 22–32)
Calcium: 8.9 mg/dL (ref 8.9–10.3)
Chloride: 107 mmol/L (ref 98–111)
Creatinine, Ser: 0.96 mg/dL (ref 0.44–1.00)
GFR, Estimated: >60 mL/min (ref >60)
Glucose, Bld: 99 mg/dL (ref 70–99)
Potassium: 3.5 mmol/L (ref 3.5–5.1)
Sodium: 138 mmol/L (ref 135–145)
Total Bilirubin: 0.9 mg/dL (ref 0.0–1.2)
Total Protein: 6.2 g/dL — ABNORMAL LOW (ref 6.5–8.1)

## 2024-03-27 LAB — CBC
HCT: 37.9 % (ref 36.0–46.0)
Hemoglobin: 12.5 g/dL (ref 12.0–15.0)
MCH: 29.1 pg (ref 26.0–34.0)
MCHC: 33 g/dL (ref 30.0–36.0)
MCV: 88.3 fL (ref 80.0–100.0)
Platelets: 249 10*3/uL (ref 150–400)
RBC: 4.29 MIL/uL (ref 3.87–5.11)
RDW: 13.8 % (ref 11.5–15.5)
WBC: 6.1 10*3/uL (ref 4.0–10.5)
nRBC: 0 % (ref 0.0–0.2)

## 2024-03-27 LAB — LIPASE, BLOOD: Lipase: 26 U/L (ref 11–51)

## 2024-03-27 MED ORDER — SODIUM CHLORIDE 0.9 % IV BOLUS
1000.0000 mL | Freq: Once | INTRAVENOUS | Status: AC
  Administered 2024-03-27: 1000 mL via INTRAVENOUS

## 2024-03-27 MED ORDER — IOHEXOL 350 MG/ML SOLN
75.0000 mL | Freq: Once | INTRAVENOUS | Status: AC | PRN
  Administered 2024-03-27: 75 mL via INTRAVENOUS

## 2024-03-27 MED ORDER — ONDANSETRON HCL 4 MG/2ML IJ SOLN
4.0000 mg | Freq: Once | INTRAMUSCULAR | Status: AC
  Administered 2024-03-27: 4 mg via INTRAVENOUS
  Filled 2024-03-27: qty 2

## 2024-03-27 NOTE — ED Notes (Signed)
 [facility] called

## 2024-03-27 NOTE — ED Provider Notes (Cosign Needed Addendum)
 Accepted handoff at shift change from Debbra Fairy, PA-C. Please see prior provider note for more detail.   Briefly: Patient is 68 y.o. presenting for right lower quadrant pain and 3 weeks of diarrhea.  DDX: concern for appendicitis, colitis, diverticulitis, UTI, kidney stone, acute cholecystitis, other.  Plan: none  Physical Exam  BP 130/68 (BP Location: Right Arm)   Pulse 71   Temp 98.2 F (36.8 C) (Oral)   Resp 16   Ht 5' (1.524 m)   Wt 72 kg   SpO2 99%   BMI 31.00 kg/m   Physical Exam  Procedures  Procedures  ED Course / MDM   Clinical Course as of 03/27/24 1554  Sun Mar 27, 2024  1509 Awaiting CT scan for diarrhea. Diarrhea for 3 weeks. Abdominal pain today, RLQ. Negative c diff test. Reassess after CT scan. Will dc if CT ok. Likely dc. [JR]    Clinical Course User Index [JR] Janalee Mcmurray, PA-C   Medical Decision Making Amount and/or Complexity of Data Reviewed Labs: ordered. Radiology: ordered.  Risk Prescription drug management.  CT scan revealing cholelithiasis but no other acute findings.  Shared these findings with patient.  Considered acute cholecystitis but unlikely given liver enzymes and lipase are within normal limits and her pain is mild and in the lower abdomen.  Suspect this is likely an incidental finding.  Advised her to follow-up with GI for persistent diarrhea.  Discussed return precautions.  Discharged in good condition.    Janalee Mcmurray, PA-C 03/27/24 1603    Ryenn Howeth K, PA-C 03/27/24 1603    Sallyanne Creamer, DO 03/29/24 1751

## 2024-03-27 NOTE — Discharge Instructions (Addendum)
 Evaluation for your diarrhea and abdominal pain was overall reassuring.  Please follow-up with GI for your persistent diarrhea.  Your CT scan was mostly unremarkable but did reveal a gallstone.  Nothing to do for this at this time.  If you develop upper abdominal pain, nausea vomiting and diarrhea, fever, or any other concerning symptom please return to the ED for further evaluation.

## 2024-03-27 NOTE — ED Triage Notes (Signed)
 Pt arrives EMS from home with reports of diarrhea for 2 weeks that worsened today. Seen at [facility] 3 days ago with negative cdiff. Pt reports positional dizziness.

## 2024-03-27 NOTE — ED Provider Notes (Signed)
 [place] EMERGENCY DEPARTMENT AT [facility] Provider Note   CSN: [id number] Arrival date & time: 03/27/24  1211     History  Chief Complaint  Patient presents with   Diarrhea    Kathy Owens is a 68 y.o. female.  The history is provided by the patient and medical records. No language interpreter was used.  Diarrhea    68 year old female with history of cerebral palsy presenting for concerns of diarrhea.  Patient has had diarrhea for nearly 3 weeks.  She mention she would have 3-4 episodes of diarrhea per day.  She was seen in the ED several days ago for her complaint.  Patient states she was not given any treatment to go home with.  She has been using over-the-counter Imodium  but states it did not provide adequate relief.  Today she noticed that stool was dark and she has had 3 episodes of diarrhea already.  She now endorsed having some right side abdominal cramping and feeling nauseous without vomiting.  She denies having fever or chills no recent antibiotic use no urinary symptom.  She lives at home by herself and her sister comes in check on her daily.  Home Medications Prior to Admission medications   Medication Sig Start Date End Date Taking? Authorizing Provider  alendronate  (FOSAMAX ) 70 MG tablet Take 1 tablet (70 mg total) by mouth every 7 (seven) days. Take with a full glass of water on an empty stomach. 04/16/23 04/15/24  Sherol Dixie, MD  atorvastatin  (LIPITOR) 10 MG tablet TAKE 1 TABLET(10 MG) BY MOUTH DAILY 04/21/23   Sherol Dixie, MD  Baclofen  5 MG TABS 5 mg in AM , 5 mg in afternoon and 10 mg nightly- for spasms/tightness- at least 4 hours in between meds- no more than 8 hours. Patient not taking: Reported on 02/13/2024 01/25/24   Lovorn, Megan, MD  cyanocobalamin  (VITAMIN B12) 1000 MCG tablet Take 1,000 mcg by mouth daily.    [provider]  HYDROcodone -acetaminophen  (NORCO/VICODIN) 5-325 MG tablet Take 1 tablet by mouth every 6 (six)  hours as needed for moderate pain (pain score 4-6) or severe pain (pain score 7-10). 03/15/24 04/14/24  Sherol Dixie, MD  loperamide  (IMODIUM ) 2 MG capsule Take 1 capsule (2 mg total) by mouth daily as needed for up to 360 doses for diarrhea or loose stools. 05/25/23   Cathey Clunes, MD  tiZANidine  (ZANAFLEX ) 4 MG tablet Take 0.5 tablets (2 mg total) by mouth 2 (two) times daily. 02/09/24   Lovorn, Megan, MD  triamterene -hydrochlorothiazide  (MAXZIDE -25) 37.5-25 MG tablet Take 1 tablet by mouth daily. 05/25/23   Cathey Clunes, MD      Allergies    Morphine and Vancomycin     Review of Systems   Review of Systems  Gastrointestinal:  Positive for diarrhea.  All other systems reviewed and are negative.   Physical Exam Updated Vital Signs BP 130/68 (BP Location: Right Arm)   Pulse 71   Temp 98.2 F (36.8 C) (Oral)   Resp 16   Ht 5' (1.524 m)   Wt 72 kg   SpO2 99%   BMI 31.00 kg/m  Physical Exam Vitals and nursing note reviewed.  Constitutional:      General: She is not in acute distress.    Appearance: She is well-developed. She is obese.  HENT:     Head: Atraumatic.  Eyes:     Conjunctiva/sclera: Conjunctivae normal.  Cardiovascular:     Rate and Rhythm: Normal rate and  regular rhythm.     Pulses: Normal pulses.     Heart sounds: Normal heart sounds.  Pulmonary:     Effort: Pulmonary effort is normal.  Abdominal:     Tenderness: There is abdominal tenderness (Mild tenderness right lower quadrant no guarding no rebound tenderness).  Musculoskeletal:     Cervical back: Neck supple.  Skin:    Findings: No rash.  Neurological:     Mental Status: She is alert.  Psychiatric:        Mood and Affect: Mood normal.     ED Results / Procedures / Treatments   Labs (all labs ordered are listed, but only abnormal results are displayed) Labs Reviewed  COMPREHENSIVE METABOLIC PANEL WITH GFR - Abnormal; Notable for the following components:      Result Value    Total Protein 6.2 (*)    All other components within normal limits  LIPASE, BLOOD  CBC  URINALYSIS, ROUTINE W REFLEX MICROSCOPIC    EKG None  Radiology No results found.  Procedures Procedures    Medications Ordered in ED Medications  sodium chloride  0.9 % bolus 1,000 mL (1,000 mLs Intravenous New Bag/Given 03/27/24 1351)  ondansetron  (ZOFRAN ) injection 4 mg (4 mg Intravenous Given 03/27/24 1351)  iohexol  (OMNIPAQUE ) 350 MG/ML injection 75 mL (75 mLs Intravenous Contrast Given 03/27/24 1521)    ED Course/ Medical Decision Making/ A&P Clinical Course as of 03/27/24 1521  Sun Mar 27, 2024  1509 Awaiting CT scan for diarrhea. Diarrhea for 3 weeks. Abdominal pain today, RLQ. Negative c diff test. Reassess after CT scan. Will dc if CT ok. Likely dc. [JR]    Clinical Course User Index [JR] Janalee Mcmurray, PA-C                                 Medical Decision Making Amount and/or Complexity of Data Reviewed Labs: ordered. Radiology: ordered.  Risk Prescription drug management.   BP 130/68 (BP Location: Right Arm)   Pulse 71   Temp 98.2 F (36.8 C) (Oral)   Resp 16   Ht 5' (1.524 m)   Wt 72 kg   SpO2 99%   BMI 31.00 kg/m   Kathy Owens 68 year old female with history of cerebral palsy presenting for concerns of diarrhea.  Patient has had diarrhea for nearly 3 weeks.  She mention she would have 3-4 episodes of diarrhea per day.  She was seen in the ED several days ago for her complaint.  Patient states she was not given any treatment to go home with.  She has been using over-the-counter Imodium  but states it did not provide adequate relief.  Today she noticed that stool was dark and she has had 3 episodes of diarrhea already.  She now endorsed having some right side abdominal cramping and feeling nauseous without vomiting.  She denies having fever or chills no recent antibiotic use no urinary symptom.  She lives at home by herself and her sister comes in check on her  daily.  On exam patient is laying in bed nontoxic in appearance.  She does have some tenderness to her right side abdomen on palpation but without guarding or rebound tenderness.  Her bowel sounds are present.  She has normal skin turgor.  Her mouth is moist.  -Labs ordered, independently viewed and interpreted by me.  Labs remarkable for reassuring labs -The patient was maintained on a cardiac monitor.  I personally  viewed and interpreted the cardiac monitored which showed an underlying rhythm of: NSR -Imaging including abd/pelvis CT is currently pending -This patient presents to the ED for concern of diarrhea, this involves an extensive number of treatment options, and is a complaint that carries with it a high risk of complications and morbidity.  The differential diagnosis includes functional diarrhea, colitis, diverticulitis, pancreatitis, appendicitis, c.diff -Co morbidities that complicate the patient evaluation includes CP -Treatment includes IVF, zofran ,  -Reevaluation of the patient after these medicines showed that the patient improved -PCP office notes or outside notes reviewed -Discussion with Dr. Monnie Anthony.  Pt sign out to oncoming provider to f/u on CT result -Escalation to admission/observation considered: dispo pending.          Final Clinical Impression(s) / ED Diagnoses Final diagnoses:  None    Rx / DC Orders ED Discharge Orders     None         Debbra Fairy, PA-C 03/27/24 1527    Trish Furl, MD 03/28/24 [phone]

## 2024-03-28 DIAGNOSIS — G800 Spastic quadriplegic cerebral palsy: Secondary | ICD-10-CM | POA: Diagnosis not present

## 2024-03-28 DIAGNOSIS — N183 Chronic kidney disease, stage 3 unspecified: Secondary | ICD-10-CM | POA: Diagnosis not present

## 2024-03-28 DIAGNOSIS — Z604 Social exclusion and rejection: Secondary | ICD-10-CM | POA: Diagnosis not present

## 2024-03-28 DIAGNOSIS — M810 Age-related osteoporosis without current pathological fracture: Secondary | ICD-10-CM | POA: Diagnosis not present

## 2024-03-28 DIAGNOSIS — I509 Heart failure, unspecified: Secondary | ICD-10-CM | POA: Diagnosis not present

## 2024-03-28 DIAGNOSIS — Z79891 Long term (current) use of opiate analgesic: Secondary | ICD-10-CM | POA: Diagnosis not present

## 2024-03-28 DIAGNOSIS — I872 Venous insufficiency (chronic) (peripheral): Secondary | ICD-10-CM | POA: Diagnosis not present

## 2024-03-28 DIAGNOSIS — I130 Hypertensive heart and chronic kidney disease with heart failure and stage 1 through stage 4 chronic kidney disease, or unspecified chronic kidney disease: Secondary | ICD-10-CM | POA: Diagnosis not present

## 2024-03-28 DIAGNOSIS — E538 Deficiency of other specified B group vitamins: Secondary | ICD-10-CM | POA: Diagnosis not present

## 2024-03-28 DIAGNOSIS — Z7982 Long term (current) use of aspirin: Secondary | ICD-10-CM | POA: Diagnosis not present

## 2024-03-28 DIAGNOSIS — E785 Hyperlipidemia, unspecified: Secondary | ICD-10-CM | POA: Diagnosis not present

## 2024-03-28 DIAGNOSIS — K219 Gastro-esophageal reflux disease without esophagitis: Secondary | ICD-10-CM | POA: Diagnosis not present

## 2024-03-29 ENCOUNTER — Other Ambulatory Visit: Payer: Self-pay | Admitting: Internal Medicine

## 2024-03-29 NOTE — Telephone Encounter (Signed)
 Called pt - no answer. No vm, unable to leave a message.

## 2024-03-31 ENCOUNTER — Ambulatory Visit: Payer: Self-pay

## 2024-03-31 ENCOUNTER — Other Ambulatory Visit: Payer: Self-pay | Admitting: Student

## 2024-03-31 DIAGNOSIS — K529 Noninfective gastroenteritis and colitis, unspecified: Secondary | ICD-10-CM

## 2024-03-31 NOTE — Telephone Encounter (Signed)
 Pt stated the diarrhea is not as bad but continues despite taking Imodium  and Kaopectate.

## 2024-03-31 NOTE — Telephone Encounter (Addendum)
 Pt called / informed of Dr Broadus Canes' response; stated she appreciate her help. I told her to give Dr Broadus Canes time to work on the idea of getting a doctor to come to her home; state she understands.

## 2024-03-31 NOTE — Telephone Encounter (Signed)
 Copied from CRM [id number]. Topic: Clinical - Red Word Triage >> Mar 31, 2024  2:41 PM Lenon Radar A wrote: Red Word that prompted transfer to Nurse Triage: Diarrhea   Reason for Disposition  Nursing judgment or information in reference  Answer Assessment - Initial Assessment Questions 1. REASON FOR CALL: What is your main concern right now?     Patient calling to see if Dr. Broadus Canes had a chance to review her earlier request for additional advice for treating her diarrhea.    Patient advised that Dr. Broadus Canes has not had a chance to review her request and that someone would reach out once she has. Patient verbalized understanding and agreement with this.  Protocols used: No Guideline Available-A-AH

## 2024-04-01 ENCOUNTER — Other Ambulatory Visit: Payer: Self-pay

## 2024-04-01 DIAGNOSIS — E785 Hyperlipidemia, unspecified: Secondary | ICD-10-CM

## 2024-04-01 MED ORDER — ATORVASTATIN CALCIUM 10 MG PO TABS: ORAL_TABLET | ORAL | 3 refills | Status: DC

## 2024-04-01 NOTE — Telephone Encounter (Signed)
 Medication sent to pharmacy

## 2024-04-04 ENCOUNTER — Other Ambulatory Visit: Payer: Self-pay | Admitting: Internal Medicine

## 2024-04-04 NOTE — Telephone Encounter (Signed)
 Pt. Kathy Owens calling back on the status of her request for the medication:  Flexeril 5MG   I told her it could be 24 to 72hrs  and that it has been escalated to the nurse and Dr.  She said okay.

## 2024-04-04 NOTE — Telephone Encounter (Signed)
 Copied from CRM [id number]. Topic: Clinical - Medication Refill >> Apr 04, 2024  8:56 AM Valeri Gate H wrote: Medication: Flexeril 5MG , was prescribed medication in the nursing home and said they work really well for her  Has the patient contacted their pharmacy? No (Agent: If no, request that the patient contact the pharmacy for the refill. If patient does not wish to contact the pharmacy document the reason why and proceed with request.) (Agent: If yes, when and what did the pharmacy advise?)  This is the patient's preferred pharmacy:  [id number] - [place], [place] - [address] AT [facility] OF [address] [place] [address] Phone: [phone] Fax: [phone]  Is this the correct pharmacy for this prescription? Yes If no, delete pharmacy and type the correct one.   Has the prescription been filled recently? No  Is the patient out of the medication? Patient reports taking them 3x a time and has 5 left.  Has the patient been seen for an appointment in the last year OR does the patient have an upcoming appointment? Yes  Can we respond through MyChart? No  Agent: Please be advised that Rx refills may take up to 3 business days. We ask that you follow-up with your pharmacy.

## 2024-04-06 ENCOUNTER — Telehealth: Payer: Self-pay

## 2024-04-06 ENCOUNTER — Other Ambulatory Visit: Payer: Self-pay | Admitting: Internal Medicine

## 2024-04-06 DIAGNOSIS — R252 Cramp and spasm: Secondary | ICD-10-CM

## 2024-04-06 MED ORDER — CYCLOBENZAPRINE HCL 5 MG PO TABS: 5.0000 mg | ORAL_TABLET | Freq: Three times a day (TID) | ORAL | 3 refills | Status: DC

## 2024-04-06 NOTE — Telephone Encounter (Signed)
 Flexeril is not on the patients medication list.

## 2024-04-06 NOTE — Telephone Encounter (Signed)
 Pertaining to Flexeril refill.    Copied from CRM [id number]. Topic: Clinical - Prescription Issue >> Apr 06, 2024 10:36 AM Karole Pacer C wrote: Reason for CRM: Patient would like an update on her refill for the following medication: Patient states she will run out soon and would like a script sent to the pharmacy. Patient would like a call back at [phone]

## 2024-04-07 DIAGNOSIS — E785 Hyperlipidemia, unspecified: Secondary | ICD-10-CM | POA: Diagnosis not present

## 2024-04-07 DIAGNOSIS — E538 Deficiency of other specified B group vitamins: Secondary | ICD-10-CM | POA: Diagnosis not present

## 2024-04-07 DIAGNOSIS — M810 Age-related osteoporosis without current pathological fracture: Secondary | ICD-10-CM | POA: Diagnosis not present

## 2024-04-07 DIAGNOSIS — I509 Heart failure, unspecified: Secondary | ICD-10-CM | POA: Diagnosis not present

## 2024-04-07 DIAGNOSIS — N183 Chronic kidney disease, stage 3 unspecified: Secondary | ICD-10-CM | POA: Diagnosis not present

## 2024-04-07 DIAGNOSIS — Z79891 Long term (current) use of opiate analgesic: Secondary | ICD-10-CM | POA: Diagnosis not present

## 2024-04-07 DIAGNOSIS — I130 Hypertensive heart and chronic kidney disease with heart failure and stage 1 through stage 4 chronic kidney disease, or unspecified chronic kidney disease: Secondary | ICD-10-CM | POA: Diagnosis not present

## 2024-04-07 DIAGNOSIS — G800 Spastic quadriplegic cerebral palsy: Secondary | ICD-10-CM | POA: Diagnosis not present

## 2024-04-07 DIAGNOSIS — Z7982 Long term (current) use of aspirin: Secondary | ICD-10-CM | POA: Diagnosis not present

## 2024-04-07 DIAGNOSIS — K219 Gastro-esophageal reflux disease without esophagitis: Secondary | ICD-10-CM | POA: Diagnosis not present

## 2024-04-07 DIAGNOSIS — Z604 Social exclusion and rejection: Secondary | ICD-10-CM | POA: Diagnosis not present

## 2024-04-07 DIAGNOSIS — I872 Venous insufficiency (chronic) (peripheral): Secondary | ICD-10-CM | POA: Diagnosis not present

## 2024-04-13 ENCOUNTER — Other Ambulatory Visit: Payer: Self-pay | Admitting: Internal Medicine

## 2024-04-13 DIAGNOSIS — G894 Chronic pain syndrome: Secondary | ICD-10-CM

## 2024-04-13 MED ORDER — HYDROCODONE-ACETAMINOPHEN 5-325 MG PO TABS
1.0000 | ORAL_TABLET | Freq: Four times a day (QID) | ORAL | 0 refills | Status: DC | PRN
Start: 2024-04-13 — End: 2024-05-11

## 2024-04-13 NOTE — Telephone Encounter (Signed)
 Last rx written - 03/15/24. Last OV - 03/24/24.

## 2024-04-13 NOTE — Telephone Encounter (Signed)
 Copied from CRM [id number]. Topic: Clinical - Medication Refill >> Apr 13, 2024  9:28 AM Miquel SAILOR wrote: Medication: HYDROcodone -acetaminophen  (NORCO/VICODIN) 5-325 MG tablet   Has the patient contacted their pharmacy? Yes (Agent: If no, request that the patient contact the pharmacy for the refill. If patient does not wish to contact the pharmacy document the reason why and proceed with request.) (Agent: If yes, when and what did the pharmacy advise?)  This is the patient's preferred pharmacy:  [id number] - [place], [place] - [address] AT [facility] OF [name] [place] [address] Phone: [phone] Fax: [phone]  Is this the correct pharmacy for this prescription? Yes If no, delete pharmacy and type the correct one.   Has the prescription been filled recently? Yes  Is the patient out of the medication? Yes  Has the patient been seen for an appointment in the last year OR does the patient have an upcoming appointment? Yes  Can we respond through MyChart? Yes  Agent: Please be advised that Rx refills may take up to 3 business days. We ask that you follow-up with your pharmacy.

## 2024-04-14 ENCOUNTER — Other Ambulatory Visit: Payer: Self-pay | Admitting: Internal Medicine

## 2024-04-14 DIAGNOSIS — G800 Spastic quadriplegic cerebral palsy: Secondary | ICD-10-CM

## 2024-04-22 DIAGNOSIS — Z604 Social exclusion and rejection: Secondary | ICD-10-CM | POA: Diagnosis not present

## 2024-04-22 DIAGNOSIS — M810 Age-related osteoporosis without current pathological fracture: Secondary | ICD-10-CM | POA: Diagnosis not present

## 2024-04-22 DIAGNOSIS — I509 Heart failure, unspecified: Secondary | ICD-10-CM | POA: Diagnosis not present

## 2024-04-22 DIAGNOSIS — H47013 Ischemic optic neuropathy, bilateral: Secondary | ICD-10-CM | POA: Diagnosis not present

## 2024-04-22 DIAGNOSIS — E739 Lactose intolerance, unspecified: Secondary | ICD-10-CM | POA: Diagnosis not present

## 2024-04-22 DIAGNOSIS — E785 Hyperlipidemia, unspecified: Secondary | ICD-10-CM | POA: Diagnosis not present

## 2024-04-22 DIAGNOSIS — I872 Venous insufficiency (chronic) (peripheral): Secondary | ICD-10-CM | POA: Diagnosis not present

## 2024-04-22 DIAGNOSIS — K219 Gastro-esophageal reflux disease without esophagitis: Secondary | ICD-10-CM | POA: Diagnosis not present

## 2024-04-22 DIAGNOSIS — G800 Spastic quadriplegic cerebral palsy: Secondary | ICD-10-CM | POA: Diagnosis not present

## 2024-04-22 DIAGNOSIS — E538 Deficiency of other specified B group vitamins: Secondary | ICD-10-CM | POA: Diagnosis not present

## 2024-04-22 DIAGNOSIS — N183 Chronic kidney disease, stage 3 unspecified: Secondary | ICD-10-CM | POA: Diagnosis not present

## 2024-04-22 DIAGNOSIS — K828 Other specified diseases of gallbladder: Secondary | ICD-10-CM | POA: Diagnosis not present

## 2024-04-22 DIAGNOSIS — Z7982 Long term (current) use of aspirin: Secondary | ICD-10-CM | POA: Diagnosis not present

## 2024-04-22 DIAGNOSIS — I130 Hypertensive heart and chronic kidney disease with heart failure and stage 1 through stage 4 chronic kidney disease, or unspecified chronic kidney disease: Secondary | ICD-10-CM | POA: Diagnosis not present

## 2024-04-25 ENCOUNTER — Telehealth: Payer: Self-pay | Admitting: *Deleted

## 2024-04-25 NOTE — Progress Notes (Signed)
 Complex Care Management Note Care Guide Note  04/25/2024 Name: Kathy Owens MRN: [id number] DOB: 07-27-1956   Complex Care Management Outreach Attempts: An unsuccessful telephone outreach was attempted today to offer the patient information about available complex care management services.  Follow Up Plan:  Additional outreach attempts will be made to offer the patient complex care management information and services.   Encounter Outcome:  No Answer  Harlene Satterfield  [facility] Health  [facility] Guide  Direct Dial: [phone]  Fax [phone]

## 2024-04-25 NOTE — Progress Notes (Signed)
 Complex Care Management Note  Care Guide Note 04/25/2024 Name: Shavanna K [place] MRN: [id number] DOB: 06/24/1956  Myrtie K [place]and is a 68 y.o. year old female who sees Trudy, Mliss Dragon, MD for primary care. I reached out to Carlen K [place] by phone today to offer complex care management services.  Ms. Swaziland was given information about Complex Care Management services today including:   The Complex Care Management services include support from the care team which includes your Nurse Care Manager, Clinical Social Worker, or Pharmacist.  The Complex Care Management team is here to help remove barriers to the health concerns and goals most important to you. Complex Care Management services are voluntary, and the patient may decline or stop services at any time by request to their care team member.   Complex Care Management Consent Status: Patient agreed to services and verbal consent obtained.   Follow up plan:  Telephone appointment with complex care management team member scheduled for:  7/17  Encounter Outcome:  Patient Scheduled  Harlene Satterfield  [facility] Health  [facility], [facility] Guide  Direct Dial: [phone]  Fax [phone]

## 2024-04-26 DIAGNOSIS — I872 Venous insufficiency (chronic) (peripheral): Secondary | ICD-10-CM | POA: Diagnosis not present

## 2024-04-26 DIAGNOSIS — Z7982 Long term (current) use of aspirin: Secondary | ICD-10-CM | POA: Diagnosis not present

## 2024-04-26 DIAGNOSIS — E785 Hyperlipidemia, unspecified: Secondary | ICD-10-CM | POA: Diagnosis not present

## 2024-04-26 DIAGNOSIS — G800 Spastic quadriplegic cerebral palsy: Secondary | ICD-10-CM | POA: Diagnosis not present

## 2024-04-26 DIAGNOSIS — I130 Hypertensive heart and chronic kidney disease with heart failure and stage 1 through stage 4 chronic kidney disease, or unspecified chronic kidney disease: Secondary | ICD-10-CM | POA: Diagnosis not present

## 2024-04-26 DIAGNOSIS — E538 Deficiency of other specified B group vitamins: Secondary | ICD-10-CM | POA: Diagnosis not present

## 2024-04-26 DIAGNOSIS — E739 Lactose intolerance, unspecified: Secondary | ICD-10-CM | POA: Diagnosis not present

## 2024-04-26 DIAGNOSIS — I509 Heart failure, unspecified: Secondary | ICD-10-CM | POA: Diagnosis not present

## 2024-04-26 DIAGNOSIS — K828 Other specified diseases of gallbladder: Secondary | ICD-10-CM | POA: Diagnosis not present

## 2024-04-26 DIAGNOSIS — N183 Chronic kidney disease, stage 3 unspecified: Secondary | ICD-10-CM | POA: Diagnosis not present

## 2024-04-26 DIAGNOSIS — K219 Gastro-esophageal reflux disease without esophagitis: Secondary | ICD-10-CM | POA: Diagnosis not present

## 2024-04-26 DIAGNOSIS — H47013 Ischemic optic neuropathy, bilateral: Secondary | ICD-10-CM | POA: Diagnosis not present

## 2024-04-26 DIAGNOSIS — Z604 Social exclusion and rejection: Secondary | ICD-10-CM | POA: Diagnosis not present

## 2024-04-26 DIAGNOSIS — M810 Age-related osteoporosis without current pathological fracture: Secondary | ICD-10-CM | POA: Diagnosis not present

## 2024-05-05 ENCOUNTER — Other Ambulatory Visit: Payer: Self-pay

## 2024-05-05 DIAGNOSIS — E538 Deficiency of other specified B group vitamins: Secondary | ICD-10-CM | POA: Diagnosis not present

## 2024-05-05 DIAGNOSIS — I130 Hypertensive heart and chronic kidney disease with heart failure and stage 1 through stage 4 chronic kidney disease, or unspecified chronic kidney disease: Secondary | ICD-10-CM | POA: Diagnosis not present

## 2024-05-05 DIAGNOSIS — E739 Lactose intolerance, unspecified: Secondary | ICD-10-CM | POA: Diagnosis not present

## 2024-05-05 DIAGNOSIS — G800 Spastic quadriplegic cerebral palsy: Secondary | ICD-10-CM | POA: Diagnosis not present

## 2024-05-05 DIAGNOSIS — M810 Age-related osteoporosis without current pathological fracture: Secondary | ICD-10-CM | POA: Diagnosis not present

## 2024-05-05 DIAGNOSIS — K219 Gastro-esophageal reflux disease without esophagitis: Secondary | ICD-10-CM | POA: Diagnosis not present

## 2024-05-05 DIAGNOSIS — Z7982 Long term (current) use of aspirin: Secondary | ICD-10-CM | POA: Diagnosis not present

## 2024-05-05 DIAGNOSIS — H47013 Ischemic optic neuropathy, bilateral: Secondary | ICD-10-CM | POA: Diagnosis not present

## 2024-05-05 DIAGNOSIS — E785 Hyperlipidemia, unspecified: Secondary | ICD-10-CM | POA: Diagnosis not present

## 2024-05-05 DIAGNOSIS — K828 Other specified diseases of gallbladder: Secondary | ICD-10-CM | POA: Diagnosis not present

## 2024-05-05 DIAGNOSIS — I872 Venous insufficiency (chronic) (peripheral): Secondary | ICD-10-CM | POA: Diagnosis not present

## 2024-05-05 DIAGNOSIS — Z604 Social exclusion and rejection: Secondary | ICD-10-CM | POA: Diagnosis not present

## 2024-05-05 DIAGNOSIS — N183 Chronic kidney disease, stage 3 unspecified: Secondary | ICD-10-CM | POA: Diagnosis not present

## 2024-05-05 DIAGNOSIS — I509 Heart failure, unspecified: Secondary | ICD-10-CM | POA: Diagnosis not present
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
## 2024-05-05 NOTE — Patient Instructions (Signed)
 Visit Information  Thank you for taking time to visit with me today. Please don't hesitate to contact me if I can be of assistance to you before our next scheduled appointment.  Our next appointment is by telephone on 05/18/24 at 2 PM Please call the care guide team at [phone] if you need to cancel or reschedule your appointment.   Following is a copy of your care plan:   Goals Addressed             This Visit's Progress    VBCI RN Care Plan   On track    Problems:  Chronic Disease Management support and education needs related to cerebral palsy and establishing with [facility] for home-based PCP  Goal: Over the next 30 days the Patient will attend all scheduled medical appointments:   as evidenced by completed visit notes uploaded to EMR        demonstrate Ongoing health management independence as evidenced by continuing to attend to ADLs indpendently and exercising daily        take all medications exactly as prescribed and will call provider for medication related questions as evidenced by patient report of medication adherence    work with PCP/clinic staff to obtain referral for [facility] home-based PCP as evidenced by review of electronic medical record and patient or care team member report   Report continued decrease in spasticity, report being able to get into a car again  Interventions:   Evaluation of current treatment plan related to cerebral palsy self-management and patient's adherence to plan as established by provider. Discussed plans with patient for ongoing care management follow up and provided patient with direct contact information for care management team Evaluation of current treatment plan related to cerebral palsy and patient's adherence to plan as established by provider Reviewed medications with patient and discussed importance of medication compliance Discussed plans with patient for ongoing care management follow up and provided patient with direct  contact information for care management team Screening for signs and symptoms of depression related to chronic disease state  Assessed social determinant of health barriers Message sent to Patient Access Advocate to inquire about status of [facility] referral. Patient states that she would still like to proceed with home-based PCP even if she is able to get into her sister/friend's car  Patient Self-Care Activities:  Attend all scheduled provider appointments Call provider office for new concerns or questions  Perform all self care activities independently  Take medications as prescribed    Plan:  Telephone follow up appointment with care management team member scheduled for:  05/18/24 at 2 PM             Please call the Suicide and Crisis Lifeline: 988 call 1-800-273-TALK (toll free, 24 hour hotline) if you are experiencing a Mental Health or Behavioral Health Crisis or need someone to talk to.  Patient verbalizes understanding of instructions and care plan provided today and agrees to view in MyChart. Active MyChart status and patient understanding of how to access instructions and care plan via MyChart confirmed with patient.     Kathy Finlay, RN MSN [place]  [facility] Population Health RN Care Manager Direct Dial: [phone]  Fax: [phone]

## 2024-05-09 ENCOUNTER — Telehealth: Payer: Self-pay | Admitting: *Deleted

## 2024-05-09 NOTE — Telephone Encounter (Unsigned)
 Copied from CRM [id number]. Topic: General - Other >> May 09, 2024  4:30 PM Susanna ORN wrote: Reason for CRM: Patient called in stating that she needed to speak with Chilon. Called [facility] & got no answer and advised patient that the office is closed for the day. Please have Chilon to give patient a call. CB #: [id number].

## 2024-05-11 ENCOUNTER — Other Ambulatory Visit: Payer: Self-pay | Admitting: Internal Medicine

## 2024-05-11 DIAGNOSIS — G894 Chronic pain syndrome: Secondary | ICD-10-CM

## 2024-05-11 MED ORDER — HYDROCODONE-ACETAMINOPHEN 5-325 MG PO TABS: 1.0000 | ORAL_TABLET | Freq: Four times a day (QID) | ORAL | 0 refills | Status: DC | PRN

## 2024-05-11 NOTE — Telephone Encounter (Unsigned)
 Copied from CRM [id number]. Topic: Clinical - Medication Refill >> May 11, 2024 11:41 AM Diannia H wrote: Medication: HYDROcodone -acetaminophen  (NORCO/VICODIN) 5-325 MG tablet  Has the patient contacted their pharmacy? Yes (Agent: If no, request that the patient contact the pharmacy for the refill. If patient does not wish to contact the pharmacy document the reason why and proceed with request.) (Agent: If yes, when and what did the pharmacy advise?)  This is the patient's preferred pharmacy:  [id number] - [place], [place] - [address] AT [facility] OF [name] [place] [address] Phone: [phone] Fax: [phone]  Is this the correct pharmacy for this prescription? Yes If no, delete pharmacy and type the correct one.   Has the prescription been filled recently? Yes  Is the patient out of the medication? Yes  Has the patient been seen for an appointment in the last year OR does the patient have an upcoming appointment? Yes  Can we respond through MyChart? Yes  Agent: Please be advised that Rx refills may take up to 3 business days. We ask that you follow-up with your pharmacy.

## 2024-05-12 ENCOUNTER — Other Ambulatory Visit: Payer: Self-pay

## 2024-05-12 DIAGNOSIS — M810 Age-related osteoporosis without current pathological fracture: Secondary | ICD-10-CM

## 2024-05-12 MED ORDER — ALENDRONATE SODIUM 70 MG PO TABS: 70.0000 mg | ORAL_TABLET | ORAL | 11 refills | Status: DC

## 2024-05-13 ENCOUNTER — Encounter: Attending: Physical Medicine and Rehabilitation | Admitting: Physical Medicine and Rehabilitation

## 2024-05-13 DIAGNOSIS — G809 Cerebral palsy, unspecified: Secondary | ICD-10-CM | POA: Insufficient documentation

## 2024-05-13 DIAGNOSIS — R252 Cramp and spasm: Secondary | ICD-10-CM | POA: Insufficient documentation

## 2024-05-17 ENCOUNTER — Telehealth: Payer: Self-pay | Admitting: *Deleted

## 2024-05-17 NOTE — Telephone Encounter (Unsigned)
 Copied from CRM [id number]. Topic: Referral - Status >> May 17, 2024  1:43 PM Miquel SAILOR wrote: Reason for CRM: Ambulatory referral to Home Health (Order # [id number]) on 04/21/2024 Patient calling on update  For app.Called office confirmed that they will;be reaching out to [employer].Nothing further

## 2024-05-18 ENCOUNTER — Other Ambulatory Visit: Payer: Self-pay
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
## 2024-05-18 NOTE — Patient Instructions (Signed)
 Visit Information  Thank you for taking time to visit with me today. Please don't hesitate to contact me if I can be of assistance to you before our next scheduled appointment.  Your next care management appointment is by telephone in the next 2-3 business days to make sure you have made contact with [facility].  Please call the care guide team at [phone] if you need to cancel, schedule, or reschedule an appointment.   Please call the Suicide and Crisis Lifeline: 988 call 1-800-273-TALK (toll free, 24 hour hotline) if you are experiencing a Mental Health or Behavioral Health Crisis or need someone to talk to.  Rosaline Finlay, RN MSN   VBCI Population Health RN Care Manager Direct Dial: [phone]  Fax: [phone]

## 2024-05-20 ENCOUNTER — Telehealth: Payer: Self-pay

## 2024-05-20 NOTE — Patient Outreach (Signed)
 Called patient to make sure she has heard from [place] with [facility] regarding the CAP program. Patient confirms she spoke with [place] yesterday but nothing was scheduled for a provider to make a visit at this time. She will be receiving forms to fill out to get second Medicaid which she is hopeful will help to provide transportation. Follow-up visit scheduled 05/30/24 at 3:15 PM to ensure patient has received forms and offer additional support.  Rosaline Finlay, RN MSN [place]  VBCI Population Health RN Care Manager Direct Dial: [phone]  Fax: [phone]

## 2024-05-30 ENCOUNTER — Other Ambulatory Visit: Payer: Self-pay
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
## 2024-05-30 NOTE — Patient Instructions (Signed)
 Visit Information  Thank you for taking time to visit with me today. Please don't hesitate to contact me if I can be of assistance to you before our next scheduled appointment.  Your next care management appointment is no further scheduled appointments.    Closing From: Complex Care Management. Patient has met all care management goals. Care Management case will be closed. Patient has been provided contact information should new needs arise.   Please call the care guide team at [phone] if you need to cancel, schedule, or reschedule an appointment.   Please call the Suicide and Crisis Lifeline: 988 call 1-800-273-TALK (toll free, 24 hour hotline) if you are experiencing a Mental Health or Behavioral Health Crisis or need someone to talk to.  Rosaline Finlay, RN MSN [place]  VBCI Population Health RN Care Manager Direct Dial: [phone]  Fax: [phone]

## 2024-06-02 ENCOUNTER — Telehealth: Payer: Self-pay | Admitting: *Deleted

## 2024-06-02 NOTE — Telephone Encounter (Signed)
 Returned call to patient  No answer Message left on recorder for return call

## 2024-06-02 NOTE — Telephone Encounter (Signed)
 Copied from CRM [id number]. Topic: Clinical - Medical Advice >> Jun 01, 2024  4:04 PM Merlynn A wrote: Reason for CRM: Patient called in requesting to speak with [place]. Please contact patient at [phone].

## 2024-06-02 NOTE — Telephone Encounter (Signed)
 Call from pt She can't remember why she called No urgent needs at this time Pt will call back should the need arises.

## 2024-06-13 ENCOUNTER — Other Ambulatory Visit: Payer: Self-pay | Admitting: Internal Medicine

## 2024-06-13 DIAGNOSIS — G894 Chronic pain syndrome: Secondary | ICD-10-CM

## 2024-06-13 NOTE — Telephone Encounter (Signed)
 05/11/24 120 tabs/0 RF

## 2024-06-13 NOTE — Telephone Encounter (Signed)
 Copied from CRM [id number]. Topic: Clinical - Medication Refill >> Jun 13, 2024 10:26 AM Merlynn A wrote: Medication: HYDROcodone -acetaminophen  (NORCO/VICODIN) 5-325 MG tablet  Has the patient contacted their pharmacy? Yes (Agent: If no, request that the patient contact the pharmacy for the refill. If patient does not wish to contact the pharmacy document the reason why and proceed with request.) (Agent: If yes, when and what did the pharmacy advise?)  Patient did not contact pharmacy due to medication being narcotic drug.   This is the patient's preferred pharmacy:  [id number] - [place], [place] - [address] AT [facility] OF [name] [place] [address] Phone: [phone] Fax: [phone]  Is this the correct pharmacy for this prescription? Yes If no, delete pharmacy and type the correct one.   Has the prescription been filled recently? No  Is the patient out of the medication? Yes  Has the patient been seen for an appointment in the last year OR does the patient have an upcoming appointment? Yes  Can we respond through MyChart? Yes  Agent: Please be advised that Rx refills may take up to 3 business days. We ask that you follow-up with your pharmacy.

## 2024-06-15 MED ORDER — HYDROCODONE-ACETAMINOPHEN 5-325 MG PO TABS: 1.0000 | ORAL_TABLET | Freq: Four times a day (QID) | ORAL | 0 refills | Status: DC | PRN

## 2024-06-16 ENCOUNTER — Ambulatory Visit (INDEPENDENT_AMBULATORY_CARE_PROVIDER_SITE_OTHER): Admitting: Internal Medicine

## 2024-06-16 DIAGNOSIS — M2351 Chronic instability of knee, right knee: Secondary | ICD-10-CM

## 2024-06-16 DIAGNOSIS — N183 Chronic kidney disease, stage 3 unspecified: Secondary | ICD-10-CM

## 2024-06-16 DIAGNOSIS — K529 Noninfective gastroenteritis and colitis, unspecified: Secondary | ICD-10-CM

## 2024-06-16 DIAGNOSIS — G8929 Other chronic pain: Secondary | ICD-10-CM

## 2024-06-16 DIAGNOSIS — Z742 Need for assistance at home and no other household member able to render care: Secondary | ICD-10-CM

## 2024-06-16 DIAGNOSIS — I10 Essential (primary) hypertension: Secondary | ICD-10-CM

## 2024-06-16 DIAGNOSIS — K591 Functional diarrhea: Secondary | ICD-10-CM

## 2024-06-16 MED ORDER — TRIAMTERENE-HCTZ 37.5-25 MG PO TABS: 1.0000 | ORAL_TABLET | Freq: Every day | ORAL | 3 refills | Status: AC

## 2024-06-16 NOTE — Assessment & Plan Note (Signed)
 Takes imodium  daily, stools are easy to pass - too loose without the imodium .

## 2024-06-16 NOTE — Assessment & Plan Note (Signed)
 Not painful, but unstable - doesn't support weight, wants to give way.  No falls.  Dr. Hiram years ago suggested a TKR may be in her future.

## 2024-06-16 NOTE — Progress Notes (Signed)
 [facility] Internal [facility] Residency Telephone Encounter Continuity Care Appointment  HPI:  This telephone encounter was created for [name] on 06/16/2024 for the following purpose/cc: discuss where things stand with referral to the new home-based primary care program through [facility], to which she has been referred and is on a waiting list.  She does currently have assistance from her sister, including shopping.  She is doing ok.  Loose stools have slowed somewhat, fairly well controlled with loperamide .  She has not had any falls.      Patient Active Problem List   Diagnosis Date Noted   Diarrhea 03/24/2024   Spasticity 01/25/2024   Corns and callosities 07/22/2022   Uncomplicated opioid dependence (HCC) 07/12/2020   Lactose intolerance in adult (presumptive dx) 07/12/2020   Porcelain gallbladder 11/04/2018   Non-arteritic AION (anterior ischemic optic neuropathy), bilateral 07/01/2017   Chronic pain due to cerebral palsy 12/13/2015   Pain of right midfoot 05/10/2015   Chronic renal insufficiency, stage 3 (moderate) (HCC) 08/04/2014   Vertigo, recurrent 07/06/2014   Subacromial impingement of right shoulder 08/10/2013   Health care maintenance 07/29/2011   Essential hypertension 02/15/2007   Osteoporosis 02/15/2007   Hyperlipemia 10/09/2006   Cerebral palsy (HCC) 10/09/2006   Venous insufficiency of left leg 10/09/2006   Current Outpatient Medications:    alendronate  (FOSAMAX ) 70 MG tablet, Take 1 tablet (70 mg total) by mouth every 7 (seven) days. Take with a full glass of water on an empty stomach., Disp: 4 tablet, Rfl: 11   atorvastatin  (LIPITOR) 10 MG tablet, TAKE 1 TABLET(10 MG) BY MOUTH DAILY, Disp: 90 tablet, Rfl: 3   cyanocobalamin  (VITAMIN B12) 1000 MCG tablet, Take 1,000 mcg by mouth daily., Disp: , Rfl:    cyclobenzaprine  (FLEXERIL ) 5 MG tablet, Take 1 tablet (5 mg total) by mouth 3 (three) times daily. For muscle spasms., Disp: 90 tablet, Rfl: 3    HYDROcodone -acetaminophen  (NORCO/VICODIN) 5-325 MG tablet, Take 1 tablet by mouth every 6 (six) hours as needed for moderate pain (pain score 4-6) or severe pain (pain score 7-10)., Disp: 120 tablet, Rfl: 0   loperamide  (IMODIUM ) 2 MG capsule, TAKE 1 CAPSULE(2 MG) BY MOUTH DAILY FOR UP TO 360 DOSES AS NEEDED FOR DIARRHEA OR LOOSE STOOLS, Disp: 90 capsule, Rfl: 3   triamterene -hydrochlorothiazide  (MAXZIDE -25) 37.5-25 MG tablet, Take 1 tablet by mouth daily., Disp: 90 tablet, Rfl: 3  Consent and Medical Decision Making:   This is a telephone encounter between Kathy Owens and Kathy Owens on 06/16/2024 for discussion of care. The visit was conducted with the patient located at home and Kathy Owens at [facility]. The patient's identity was confirmed using their DOB and current address. The patient has consented to being evaluated through a telephone encounter and understands the associated risks (an examination cannot be done and the patient may need to come in for an appointment) / benefits (allows the patient to remain at home, decreasing exposure to coronavirus). I personally spent 13 minutes on medical discussion.     Assessment and plan:  Chronic diarrhea of unknown origin Assessment & Plan: Takes imodium  daily, stools are easy to pass - too loose without the imodium .  Chronic instability of right knee Assessment & Plan: Not painful, but unstable - doesn't support weight, wants to give way.  No falls.  Dr. Hiram years ago suggested a TKR may be in her future.   Essential hypertension Assessment & Plan: Most recent BPs on chart in June 2025  ranged from systolic 109-136.  Diastolics as low as 43.  She is prescribed Maxide 25-37.5 mg daily, on which her potassium and renal function were normal last month.  Continue regimen. Orders: -     Triamterene -HCTZ; Take 1 tablet by mouth daily.  Dispense: 90 tablet; Refill: 3  Chronic renal insufficiency, stage 3 (moderate) (HCC) Assessment  & Plan: Most recent renal function 03/27/2024 creatinine 0.96, eGFR greater than 60.  Will resolve CKD stage III entry and continue to monitor.  She does not have diabetes.  Other chronic pain Assessment & Plan: Refills have been appropriate for long term opioid use and muscle relaxant.  She has undergone PMR consultation.  No additional interventions.   Need for home health care Assessment & Plan: Referral placed for [facility] Primary Care program; spoke with the program, and there may be an opening by the end of September 2025.  Individual does not have to be bedbound to qualify (this was a question [name] had).  [name] didn't realize that the [facility] program would replace her [facility] doctors, and she doesn't think she'll go through with the transition.  In encouraged her to do so - labs could be drawn in her home, checkups, etc.... it would be much more convenient for her.  We have discussed [facility] in the past and she had declined.

## 2024-06-29 NOTE — Telephone Encounter (Signed)
 Please see previous message.   Copied from CRM [id number]. Topic: General - Transportation >> Jun 28, 2024  1:03 PM Fredrica W wrote: Reason for CRM: Patient called to see if form received and completed for [employer] transportation. Thank You

## 2024-06-29 NOTE — Telephone Encounter (Signed)
 Message forwarded to [facility] [employer] to f/u with [employer] Paperwork.  Copied from CRM #[id number]. Topic: General - Other >> Jun 28, 2024  1:38 PM Kathy Owens wrote: Question: If [employer] Transportation document have been received?   Please advise.

## 2024-06-29 NOTE — Telephone Encounter (Unsigned)
 Copied from CRM [id number]. Topic: Clinical - Medical Advice >> Jun 29, 2024  1:03 PM Suzette B wrote: Patient has requested a nurse to call her she needed speak with someone in regards to medical advice about her kidney

## 2024-07-04 ENCOUNTER — Telehealth: Payer: Self-pay | Admitting: *Deleted

## 2024-07-04 NOTE — Telephone Encounter (Signed)
 I contacted pt to let her know that Dr. Trudy has completed and faxed [employer] transportation application. Confirmation went through. Per patient request, she would like the application to be mailed to her home address.

## 2024-07-04 NOTE — Telephone Encounter (Signed)
 Call from pt requesting update on [employer] paperwork CMA unable to locate paperwork  Will forward info to appropriate clinical staff for assistance.

## 2024-07-04 NOTE — Telephone Encounter (Signed)
 Copied from CRM [id number]. Topic: General - Transportation >> Jun 28, 2024  1:03 PM Fredrica W wrote: Reason for CRM: Patient called to see if form received and completed for [employer] transportation. Thank You >> Jul 04, 2024  1:43 PM Miquel SAILOR wrote: Patient calling on updated if paper work received on [employer]. Called office and transferred

## 2024-07-06 ENCOUNTER — Encounter: Payer: Self-pay | Admitting: Internal Medicine

## 2024-07-06 DIAGNOSIS — Z742 Need for assistance at home and no other household member able to render care: Secondary | ICD-10-CM | POA: Insufficient documentation

## 2024-07-06 NOTE — Assessment & Plan Note (Signed)
 Most recent renal function 03/27/2024 creatinine 0.96, eGFR greater than 60.  Will resolve CKD stage III entry and continue to monitor.  She does not have diabetes.

## 2024-07-06 NOTE — Assessment & Plan Note (Signed)
 Endorses adherence to Fosamax  without side effects.   Eight years now on her current course of bisphosphonate.  She is high risk of falls.  Will need to employ shared decision making on whether she may again pause therapy for a few years.  We didn't discuss today.

## 2024-07-06 NOTE — Assessment & Plan Note (Signed)
 Referral placed for [facility] Primary Care program; spoke with the program, and there may be an opening by the end of September 2025.  Individual does not have to be bedbound to qualify (this was a question Kathy Owens had).  Kathy Owens didn't realize that the [facility] program would replace her [facility] doctors, and she doesn't think she'll go through with the transition.  In encouraged her to do so - labs could be drawn in her home, checkups, etc.... it would be much more convenient for her.  We have discussed [facility]ACE in the past and she had declined.

## 2024-07-06 NOTE — Assessment & Plan Note (Signed)
 Most recent BPs on chart in June 2025 ranged from systolic 109-136.  Diastolics as low as 43.  She is prescribed Maxide 25-37.5 mg daily, on which her potassium and renal function were normal last month.  Continue regimen.

## 2024-07-06 NOTE — Assessment & Plan Note (Signed)
 Refills have been appropriate for long term opioid use and muscle relaxant.  She has undergone PMR consultation.  No additional interventions.

## 2024-07-11 ENCOUNTER — Other Ambulatory Visit: Payer: Self-pay | Admitting: Internal Medicine

## 2024-07-11 DIAGNOSIS — G894 Chronic pain syndrome: Secondary | ICD-10-CM

## 2024-07-11 NOTE — Telephone Encounter (Unsigned)
 Copied from CRM [id number]. Topic: Clinical - Medication Refill >> Jul 11, 2024  1:22 PM Brittney F wrote: Patient is calling in to refill prescription listed. Patient is out of the following prescription.  Medication: HYDROcodone -acetaminophen  (NORCO/VICODIN) 5-325 MG tablet  Has the patient contacted their pharmacy? No   This is the patient's preferred pharmacy:  [id number] - [place], [place] - [address] AT [facility] OF [name] [place] [address] Phone: [phone] Fax: [phone]  Is this the correct pharmacy for this prescription? Yes If no, delete pharmacy and type the correct one.   Has the prescription been filled recently? No  Is the patient out of the medication? Yes  Has the patient been seen for an appointment in the last year OR does the patient have an upcoming appointment? Yes  Can we respond through MyChart? Yes  Agent: Please be advised that Rx refills may take up to 3 business days. We ask that you follow-up with your pharmacy.

## 2024-07-12 NOTE — Telephone Encounter (Signed)
 Last rx written - 06/15/24. Last OV- 06/16/24 (virtual). Next OV - 08/11/24.

## 2024-07-13 ENCOUNTER — Other Ambulatory Visit: Payer: Self-pay

## 2024-07-13 ENCOUNTER — Emergency Department (HOSPITAL_COMMUNITY)
Admission: EM | Admit: 2024-07-13 | Discharge: 2024-07-14 | Disposition: A | Discharge status: Home / Self Care | Attending: Emergency Medicine | Admitting: Emergency Medicine

## 2024-07-13 DIAGNOSIS — I10 Essential (primary) hypertension: Secondary | ICD-10-CM | POA: Diagnosis not present

## 2024-07-13 DIAGNOSIS — H1032 Unspecified acute conjunctivitis, left eye: Secondary | ICD-10-CM | POA: Insufficient documentation

## 2024-07-13 DIAGNOSIS — H579 Unspecified disorder of eye and adnexa: Secondary | ICD-10-CM | POA: Diagnosis not present

## 2024-07-13 LAB — CBC WITH DIFFERENTIAL/PLATELET
Abs Immature Granulocytes: 0.02 K/uL (ref 0.00–0.07)
Basophils Absolute: 0 K/uL (ref 0.0–0.1)
Basophils Relative: 0 %
Eosinophils Absolute: 0.2 K/uL (ref 0.0–0.5)
Eosinophils Relative: 3 %
HCT: 40.9 % (ref 36.0–46.0)
Hemoglobin: 13.6 g/dL (ref 12.0–15.0)
Immature Granulocytes: 0 %
Lymphocytes Relative: 19 %
Lymphs Abs: 0.9 K/uL (ref 0.7–4.0)
MCH: 28.9 pg (ref 26.0–34.0)
MCHC: 33.3 g/dL (ref 30.0–36.0)
MCV: 87 fL (ref 80.0–100.0)
Monocytes Absolute: 0.5 K/uL (ref 0.1–1.0)
Monocytes Relative: 10 %
Neutro Abs: 3 K/uL (ref 1.7–7.7)
Neutrophils Relative %: 68 %
Platelets: 214 K/uL (ref 150–400)
RBC: 4.7 MIL/uL (ref 3.87–5.11)
RDW: 14.5 % (ref 11.5–15.5)
WBC: 4.6 K/uL (ref 4.0–10.5)
nRBC: 0 % (ref 0.0–0.2)

## 2024-07-13 LAB — COMPREHENSIVE METABOLIC PANEL WITH GFR
ALT: 22 U/L (ref 0–44)
AST: 23 U/L (ref 15–41)
Albumin: 4.3 g/dL (ref 3.5–5.0)
Alkaline Phosphatase: 57 U/L (ref 38–126)
Anion gap: 14 (ref 5–15)
BUN: 21 mg/dL (ref 8–23)
CO2: 23 mmol/L (ref 22–32)
Calcium: 9.1 mg/dL (ref 8.9–10.3)
Chloride: 97 mmol/L — ABNORMAL LOW (ref 98–111)
Creatinine, Ser: 1.23 mg/dL — ABNORMAL HIGH (ref 0.44–1.00)
GFR, Estimated: 48 mL/min — ABNORMAL LOW (ref >60)
Glucose, Bld: 102 mg/dL — ABNORMAL HIGH (ref 70–99)
Potassium: 3.4 mmol/L — ABNORMAL LOW (ref 3.5–5.1)
Sodium: 134 mmol/L — ABNORMAL LOW (ref 135–145)
Total Bilirubin: 1.2 mg/dL (ref 0.0–1.2)
Total Protein: 6.6 g/dL (ref 6.5–8.1)

## 2024-07-13 MED ORDER — TOBRAMYCIN 0.3 % OP SOLN: 2.0000 [drp] | OPHTHALMIC | 0 refills | Status: DC

## 2024-07-13 MED ORDER — HYDROCODONE-ACETAMINOPHEN 5-325 MG PO TABS: 1.0000 | ORAL_TABLET | Freq: Four times a day (QID) | ORAL | 0 refills | Status: DC | PRN

## 2024-07-13 MED ORDER — TETRACAINE HCL 0.5 % OP SOLN
2.0000 [drp] | Freq: Once | OPHTHALMIC | Status: AC
  Administered 2024-07-13: 2 [drp] via OPHTHALMIC
  Filled 2024-07-13: qty 4

## 2024-07-13 MED ORDER — FLUORESCEIN SODIUM 1 MG OP STRP
1.0000 | ORAL_STRIP | Freq: Once | OPHTHALMIC | Status: AC
  Administered 2024-07-13: 1 via OPHTHALMIC
  Filled 2024-07-13: qty 1

## 2024-07-13 NOTE — ED Triage Notes (Signed)
 Patient reports left eye redness /itchy with drainage for 3 days , denies injury or vision loss.

## 2024-07-13 NOTE — ED Provider Notes (Signed)
 [place] EMERGENCY DEPARTMENT AT [facility] Provider Note   CSN: [id number] Arrival date & time: 07/13/24  8057     Patient presents with: Conjunctivitis   Kathy Owens is a 68 y.o. female. Patient with past medical history significant for CP, HTN, vertigo presents to the emergency department complaining of left-sided eye irritation with redness, mild swelling, discharge. She describes crusty discharge for the past two days.  No recent illness or specific known sick contacts.  No systemic symptoms.    Conjunctivitis       Prior to Admission medications   Medication Sig Start Date End Date Taking? Authorizing Provider  alendronate  (FOSAMAX ) 70 MG tablet Take 1 tablet (70 mg total) by mouth every 7 (seven) days. Take with a full glass of water on an empty stomach. 05/12/24 05/12/25  Trudy Mliss Dragon, MD  atorvastatin  (LIPITOR) 10 MG tablet TAKE 1 TABLET(10 MG) BY MOUTH DAILY 04/01/24   Trudy Mliss Dragon, MD  cyanocobalamin  (VITAMIN B12) 1000 MCG tablet Take 1,000 mcg by mouth daily.    [provider]  cyclobenzaprine  (FLEXERIL ) 5 MG tablet Take 1 tablet (5 mg total) by mouth 3 (three) times daily. For muscle spasms. 04/06/24   Trudy Mliss Dragon, MD  HYDROcodone -acetaminophen  (NORCO/VICODIN) 5-325 MG tablet Take 1 tablet by mouth every 6 (six) hours as needed for moderate pain (pain score 4-6) or severe pain (pain score 7-10). 07/19/24 08/18/24  Trudy Mliss Dragon, MD  loperamide  (IMODIUM ) 2 MG capsule TAKE 1 CAPSULE(2 MG) BY MOUTH DAILY FOR UP TO 360 DOSES AS NEEDED FOR DIARRHEA OR LOOSE STOOLS 03/31/24   Trudy Mliss Dragon, MD  tobramycin  (TOBREX ) 0.3 % ophthalmic solution Place 2 drops into both eyes every 4 (four) hours. 07/13/24  Yes Logan Ubaldo NOVAK, PA-C  triamterene -hydrochlorothiazide  (MAXZIDE -25) 37.5-25 MG tablet Take 1 tablet by mouth daily. 06/16/24   Trudy Mliss Dragon, MD    Allergies: Morphine and Vancomycin     Review of  Systems  Updated Vital Signs BP 134/70 (BP Location: Right Arm)   Pulse 79   Temp 98.3 F (36.8 C)   Resp (!) 22   SpO2 96%   Physical Exam Vitals and nursing note reviewed.  HENT:     Head: Normocephalic and atraumatic.  Eyes:     General: Vision grossly intact.        Right eye: No discharge.        Left eye: Discharge present.No foreign body.     Extraocular Movements:     Right eye: Normal extraocular motion.     Left eye: Normal extraocular motion.     Conjunctiva/sclera:     Left eye: Exudate present.     Pupils: Pupils are equal, round, and reactive to light.     Left eye: No corneal abrasion or fluorescein  uptake. Seidel exam negative.    Slit lamp exam:    Left eye: No corneal ulcer.  Pulmonary:     Effort: Pulmonary effort is normal. No respiratory distress.  Musculoskeletal:        General: No signs of injury.     Cervical back: Normal range of motion.  Skin:    General: Skin is dry.  Neurological:     Mental Status: She is alert.  Psychiatric:        Speech: Speech normal.        Behavior: Behavior normal.     (all labs ordered are listed, but only abnormal results are displayed) Labs Reviewed  COMPREHENSIVE  METABOLIC PANEL WITH GFR - Abnormal; Notable for the following components:      Result Value   Sodium 134 (*)    Potassium 3.4 (*)    Chloride 97 (*)    Glucose, Bld 102 (*)    Creatinine, Ser 1.23 (*)    GFR, Estimated 48 (*)    All other components within normal limits  CBC WITH DIFFERENTIAL/PLATELET    EKG: None  Radiology: No results found.   Procedures   Medications Ordered in the ED  fluorescein  ophthalmic strip 1 strip (1 strip Left Eye Given 07/13/24 2245)  tetracaine  (PONTOCAINE) 0.5 % ophthalmic solution 2 drop (2 drops Left Eye Given 07/13/24 2246)                                    Medical Decision Making Risk Prescription drug management.   Patient presents with a chief complaint of eye irritation.  Differential  includes bacterial conjunctivitis, viral conjunctivitis, allergic radiculitis, preseptal cellulitis, corneal ulcer, others  I ordered and interpreted labs including CBC and CMP.  Grossly unremarkable labs.  The patient was treated with tetracaine  for local anesthesia.  Eye exam grossly unremarkable with no fluorescein  uptake.  No sign of corneal abrasion.  No pain with extraocular movements to suggest orbital cellulitis.  No significant periorbital swelling.  Eyes injected with some exudate.  Suspicious for bacterial conjunctivitis.  Plan to treat with antibiotics as an outpatient.  Patient stable for discharge home with no indication for admission or further emergent workup.  Patient to follow-up with primary care as needed     Final diagnoses:  Acute conjunctivitis of left eye, unspecified acute conjunctivitis type    ED Discharge Orders          Ordered    tobramycin  (TOBREX ) 0.3 % ophthalmic solution  Every 4 hours        07/13/24 2308               Logan Ubaldo KATHEE DEVONNA 07/13/24 2308    Bari Charmaine FALCON, MD 07/14/24 [phone]

## 2024-07-13 NOTE — ED Provider Triage Note (Signed)
 Emergency Medicine Provider Triage Evaluation Note  Kathy Owens , a 68 y.o. female  was evaluated in triage.  Pt complains of soft tissue swelling around the left eye as well as erythema and warmth and pain.  Has been ongoing and associated with drainage from left eye.  Denies any pain with eye range of motion or obvious vision change.  Review of Systems  Positive: Periorbital cellulitis  Negative: Fever  Physical Exam  BP 134/70 (BP Location: Right Arm)   Pulse 79   Temp 98.3 F (36.8 C)   Resp (!) 22   SpO2 96%  Gen:   Awake, no distress   Resp:  Normal effort  MSK:   Moves extremities without difficulty  Other:    Medical Decision Making  Medically screening exam initiated at 8:15 PM.  Appropriate orders placed.  Kathy Owens was informed that the remainder of the evaluation will be completed by another provider, this initial triage assessment does not replace that evaluation, and the importance of remaining in the ED until their evaluation is complete.     Shermon Warren SAILOR, [place] 07/13/24 2016

## 2024-07-13 NOTE — Discharge Instructions (Addendum)
 Please take the prescribed drops in both eyes as directed daily for the next 7 days.  Follow-up with your primary care provider as needed.  If you have changes in vision you need to see an eye care professional or return to the emergency department.

## 2024-07-13 NOTE — ED Notes (Signed)
 [facility] called

## 2024-07-14 ENCOUNTER — Ambulatory Visit: Payer: Self-pay | Admitting: Internal Medicine

## 2024-07-15 ENCOUNTER — Emergency Department (HOSPITAL_COMMUNITY)
Admission: EM | Admit: 2024-07-15 | Discharge: 2024-07-16 | Disposition: A | Discharge status: Home / Self Care | Attending: Emergency Medicine | Admitting: Emergency Medicine

## 2024-07-15 DIAGNOSIS — B0230 Zoster ocular disease, unspecified: Secondary | ICD-10-CM

## 2024-07-15 DIAGNOSIS — R6889 Other general symptoms and signs: Secondary | ICD-10-CM | POA: Diagnosis not present

## 2024-07-15 DIAGNOSIS — B0239 Other herpes zoster eye disease: Secondary | ICD-10-CM | POA: Diagnosis not present

## 2024-07-15 DIAGNOSIS — B029 Zoster without complications: Secondary | ICD-10-CM | POA: Diagnosis not present

## 2024-07-15 DIAGNOSIS — N183 Chronic kidney disease, stage 3 unspecified: Secondary | ICD-10-CM | POA: Insufficient documentation

## 2024-07-15 DIAGNOSIS — I129 Hypertensive chronic kidney disease with stage 1 through stage 4 chronic kidney disease, or unspecified chronic kidney disease: Secondary | ICD-10-CM | POA: Diagnosis not present

## 2024-07-15 DIAGNOSIS — H5712 Ocular pain, left eye: Secondary | ICD-10-CM | POA: Diagnosis present

## 2024-07-15 DIAGNOSIS — Z96641 Presence of right artificial hip joint: Secondary | ICD-10-CM | POA: Diagnosis not present

## 2024-07-15 DIAGNOSIS — R531 Weakness: Secondary | ICD-10-CM | POA: Diagnosis not present

## 2024-07-15 DIAGNOSIS — Z79899 Other long term (current) drug therapy: Secondary | ICD-10-CM | POA: Insufficient documentation
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
## 2024-07-16 ENCOUNTER — Other Ambulatory Visit: Payer: Self-pay

## 2024-07-16 ENCOUNTER — Encounter (HOSPITAL_COMMUNITY): Payer: Self-pay

## 2024-07-16 DIAGNOSIS — B0239 Other herpes zoster eye disease: Secondary | ICD-10-CM | POA: Diagnosis not present

## 2024-07-16 DIAGNOSIS — Z7401 Bed confinement status: Secondary | ICD-10-CM | POA: Diagnosis not present

## 2024-07-16 DIAGNOSIS — Z743 Need for continuous supervision: Secondary | ICD-10-CM | POA: Diagnosis not present

## 2024-07-16 DIAGNOSIS — R262 Difficulty in walking, not elsewhere classified: Secondary | ICD-10-CM | POA: Diagnosis not present

## 2024-07-16 DIAGNOSIS — H579 Unspecified disorder of eye and adnexa: Secondary | ICD-10-CM | POA: Diagnosis not present

## 2024-07-16 MED ORDER — TETRACAINE HCL 0.5 % OP SOLN
2.0000 [drp] | Freq: Once | OPHTHALMIC | Status: AC
  Administered 2024-07-16: 2 [drp] via OPHTHALMIC
  Filled 2024-07-16: qty 4

## 2024-07-16 MED ORDER — VALACYCLOVIR HCL 500 MG PO TABS
1000.0000 mg | ORAL_TABLET | Freq: Once | ORAL | Status: AC
  Administered 2024-07-16: 1000 mg via ORAL
  Filled 2024-07-16: qty 2

## 2024-07-16 MED ORDER — VALACYCLOVIR HCL 500 MG PO TABS
1000.0000 mg | ORAL_TABLET | Freq: Once | ORAL | Status: AC
  Administered 2024-07-16: 1000 mg via ORAL
  Filled 2024-07-16 (×2): qty 2

## 2024-07-16 MED ORDER — FLUORESCEIN SODIUM 1 MG OP STRP
1.0000 | ORAL_STRIP | Freq: Once | OPHTHALMIC | Status: AC
  Administered 2024-07-16: 1 via OPHTHALMIC
  Filled 2024-07-16: qty 1

## 2024-07-16 MED ORDER — VALACYCLOVIR HCL 1 G PO TABS: 1000.0000 mg | ORAL_TABLET | Freq: Two times a day (BID) | ORAL | 0 refills | Status: DC

## 2024-07-16 MED ORDER — TRIAMTERENE-HCTZ 37.5-25 MG PO TABS
1.0000 | ORAL_TABLET | Freq: Every day | ORAL | Status: DC
Start: 2024-07-16 — End: 2024-07-16
  Administered 2024-07-16: 1 via ORAL
  Filled 2024-07-16: qty 1
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
## 2024-07-16 NOTE — ED Provider Notes (Signed)
 Patient was initially seen by Dr. Midge.  Please see his note.  Patient noted to have shingles involving the face.  Ophthalmology Dr. Corbin evaluated the patient in the ED.  She does not have any signs of ocular involvement.  Patient can be discharged home with outpatient antiviral treatment   Randol Simmonds, MD 07/16/24 1159

## 2024-07-16 NOTE — Discharge Instructions (Addendum)
 Take the antiviral medication as prescribed.  You can apply topical antibiotic ointment to the facial rash.  Follow-up with Dr. Grissom as instructed

## 2024-07-19 ENCOUNTER — Telehealth: Payer: Self-pay

## 2024-07-19 ENCOUNTER — Telehealth: Payer: Self-pay | Admitting: *Deleted

## 2024-07-19 NOTE — Telephone Encounter (Signed)
 Patient would like to speak to Dr. Laura about getting an electric wheelchair (Call back phone [phone]).

## 2024-07-19 NOTE — Telephone Encounter (Signed)
 Will forward to C. Shirlean to see if patient's insurance requires a visit prior to ordering.                                           Copied from CRM #[id number]. Topic: Clinical - Order For Equipment >> Jul 19, 2024  1:36 PM Mercer PEDLAR wrote: Reason for CRM: Patient is requesting an order for [employer] wheelchair.

## 2024-07-20 ENCOUNTER — Other Ambulatory Visit: Payer: Self-pay | Admitting: Internal Medicine

## 2024-07-20 DIAGNOSIS — R252 Cramp and spasm: Secondary | ICD-10-CM

## 2024-07-20 DIAGNOSIS — Z79899 Other long term (current) drug therapy: Secondary | ICD-10-CM

## 2024-07-20 MED ORDER — DANTROLENE SODIUM 50 MG PO CAPS: 50.0000 mg | ORAL_CAPSULE | Freq: Every day | ORAL | 3 refills | Status: DC

## 2024-07-20 NOTE — Telephone Encounter (Signed)
 Tried Zanaflex - had side effects and Baclofen - made her hallucinate.   Could try Dantrolene- but Cyproheptatine-   Asking for me to call in Dantrolene. No hx of liver issues and labs look great as of 07/15/24.    Has appt with Dr Trudy at end of October so can get CMP at that time. Sent Dr Trudy a secure chat to ask for this to be done.    Dantrolene 50 mg at bedtime x 1 week 50 mg 2x/day x 1 week 50 mg in AM and 100 mg at bedtime x 1 week Then 100 mg 2x/day- for spasticity  Her main issue is bad spasticity and flexeril  not working-  failed baclofen  and zanaflex - ended up in hospital due to hallucinations.   Wants power w/c, but haven't seen in 6 months, so cannot prescribe until sees again- will ask front desk to make f/u appt.

## 2024-07-21 ENCOUNTER — Ambulatory Visit: Admitting: Internal Medicine

## 2024-07-21 ENCOUNTER — Telehealth: Payer: Self-pay | Admitting: *Deleted

## 2024-07-21 NOTE — Telephone Encounter (Signed)
 Pt called / informed it's safe (for her kidneys) to take Valtrex per Dr Trudy. She also stated she's having trouble walking since having shingles; I can't walk. Her sister [name] is with her.

## 2024-07-21 NOTE — Telephone Encounter (Signed)
 Copied from CRM [id number]. Topic: Clinical - Prescription Issue >> Jul 21, 2024  2:09 PM Brittney F wrote: Reason for CRM:   Caller: Daisy  Calling in on behalf of her sister, the patient, who is concerned about her kidney function while taking the prescribed medication of valACYclovir (VALTREX) 1000 MG tablet for her eye shingles. Patient has the concern because she was informed that due to her having stage 3 kidney disease she should be closely monitored while taking this prescription. The patient would like to receive a call from a clinical staff member preferable her provider to explain whether or not the medication is a safe option for her.   At this present time the patient cannot come into the office for a visit due to limited mobility.   Callback Number: [phone]

## 2024-07-21 NOTE — Telephone Encounter (Signed)
 Pt was seen in the ER on 9/26; Valtrex was prescribed by the ED doctor on 9/27.

## 2024-07-23 ENCOUNTER — Inpatient Hospital Stay (HOSPITAL_COMMUNITY)
Admission: EM | Admit: 2024-07-23 | Discharge: 2024-08-04 | DRG: 053 | Disposition: A | Discharge status: Rehabilitation Facility | Attending: Infectious Diseases | Admitting: Infectious Diseases

## 2024-07-23 ENCOUNTER — Encounter (HOSPITAL_COMMUNITY): Payer: Self-pay | Admitting: *Deleted

## 2024-07-23 ENCOUNTER — Emergency Department (HOSPITAL_COMMUNITY)

## 2024-07-23 ENCOUNTER — Other Ambulatory Visit: Payer: Self-pay

## 2024-07-23 DIAGNOSIS — M810 Age-related osteoporosis without current pathological fracture: Secondary | ICD-10-CM | POA: Diagnosis not present

## 2024-07-23 DIAGNOSIS — B029 Zoster without complications: Secondary | ICD-10-CM | POA: Diagnosis present

## 2024-07-23 DIAGNOSIS — M48061 Spinal stenosis, lumbar region without neurogenic claudication: Secondary | ICD-10-CM | POA: Diagnosis present

## 2024-07-23 DIAGNOSIS — E876 Hypokalemia: Secondary | ICD-10-CM | POA: Diagnosis not present

## 2024-07-23 DIAGNOSIS — H47013 Ischemic optic neuropathy, bilateral: Secondary | ICD-10-CM | POA: Diagnosis not present

## 2024-07-23 DIAGNOSIS — H409 Unspecified glaucoma: Secondary | ICD-10-CM | POA: Diagnosis present

## 2024-07-23 DIAGNOSIS — Z881 Allergy status to other antibiotic agents status: Secondary | ICD-10-CM

## 2024-07-23 DIAGNOSIS — R531 Weakness: Secondary | ICD-10-CM

## 2024-07-23 DIAGNOSIS — Z811 Family history of alcohol abuse and dependence: Secondary | ICD-10-CM

## 2024-07-23 DIAGNOSIS — T375X5A Adverse effect of antiviral drugs, initial encounter: Secondary | ICD-10-CM | POA: Diagnosis not present

## 2024-07-23 DIAGNOSIS — G894 Chronic pain syndrome: Secondary | ICD-10-CM | POA: Diagnosis present

## 2024-07-23 DIAGNOSIS — I872 Venous insufficiency (chronic) (peripheral): Secondary | ICD-10-CM | POA: Diagnosis present

## 2024-07-23 DIAGNOSIS — M4802 Spinal stenosis, cervical region: Secondary | ICD-10-CM | POA: Diagnosis present

## 2024-07-23 DIAGNOSIS — R269 Unspecified abnormalities of gait and mobility: Secondary | ICD-10-CM | POA: Diagnosis not present

## 2024-07-23 DIAGNOSIS — M62838 Other muscle spasm: Secondary | ICD-10-CM | POA: Diagnosis not present

## 2024-07-23 DIAGNOSIS — Z96641 Presence of right artificial hip joint: Secondary | ICD-10-CM | POA: Diagnosis present

## 2024-07-23 DIAGNOSIS — Z7983 Long term (current) use of bisphosphonates: Secondary | ICD-10-CM | POA: Diagnosis not present

## 2024-07-23 DIAGNOSIS — Z66 Do not resuscitate: Secondary | ICD-10-CM | POA: Diagnosis present

## 2024-07-23 DIAGNOSIS — R0989 Other specified symptoms and signs involving the circulatory and respiratory systems: Secondary | ICD-10-CM | POA: Diagnosis not present

## 2024-07-23 DIAGNOSIS — Z79899 Other long term (current) drug therapy: Secondary | ICD-10-CM

## 2024-07-23 DIAGNOSIS — Z885 Allergy status to narcotic agent status: Secondary | ICD-10-CM

## 2024-07-23 DIAGNOSIS — R8290 Unspecified abnormal findings in urine: Secondary | ICD-10-CM | POA: Diagnosis present

## 2024-07-23 DIAGNOSIS — Z23 Encounter for immunization: Secondary | ICD-10-CM

## 2024-07-23 DIAGNOSIS — Z888 Allergy status to other drugs, medicaments and biological substances status: Secondary | ICD-10-CM

## 2024-07-23 DIAGNOSIS — G800 Spastic quadriplegic cerebral palsy: Principal | ICD-10-CM | POA: Diagnosis present

## 2024-07-23 DIAGNOSIS — I129 Hypertensive chronic kidney disease with stage 1 through stage 4 chronic kidney disease, or unspecified chronic kidney disease: Secondary | ICD-10-CM | POA: Diagnosis present

## 2024-07-23 DIAGNOSIS — G809 Cerebral palsy, unspecified: Secondary | ICD-10-CM | POA: Diagnosis present

## 2024-07-23 DIAGNOSIS — Z87898 Personal history of other specified conditions: Secondary | ICD-10-CM

## 2024-07-23 DIAGNOSIS — T50905A Adverse effect of unspecified drugs, medicaments and biological substances, initial encounter: Secondary | ICD-10-CM | POA: Diagnosis not present

## 2024-07-23 DIAGNOSIS — M129 Arthropathy, unspecified: Secondary | ICD-10-CM | POA: Diagnosis not present

## 2024-07-23 DIAGNOSIS — Z79891 Long term (current) use of opiate analgesic: Secondary | ICD-10-CM

## 2024-07-23 DIAGNOSIS — K529 Noninfective gastroenteritis and colitis, unspecified: Secondary | ICD-10-CM | POA: Diagnosis present

## 2024-07-23 DIAGNOSIS — N183 Chronic kidney disease, stage 3 unspecified: Secondary | ICD-10-CM | POA: Diagnosis present

## 2024-07-23 DIAGNOSIS — R5383 Other fatigue: Secondary | ICD-10-CM | POA: Diagnosis not present

## 2024-07-23 DIAGNOSIS — Z751 Person awaiting admission to adequate facility elsewhere: Secondary | ICD-10-CM

## 2024-07-23 DIAGNOSIS — M5126 Other intervertebral disc displacement, lumbar region: Secondary | ICD-10-CM | POA: Diagnosis present

## 2024-07-23 DIAGNOSIS — E785 Hyperlipidemia, unspecified: Secondary | ICD-10-CM | POA: Diagnosis present

## 2024-07-23 DIAGNOSIS — T428X5A Adverse effect of antiparkinsonism drugs and other central muscle-tone depressants, initial encounter: Secondary | ICD-10-CM | POA: Diagnosis not present

## 2024-07-23 DIAGNOSIS — Z886 Allergy status to analgesic agent status: Secondary | ICD-10-CM

## 2024-07-23 DIAGNOSIS — Z8249 Family history of ischemic heart disease and other diseases of the circulatory system: Secondary | ICD-10-CM | POA: Diagnosis not present

## 2024-07-23 DIAGNOSIS — Z5982 Transportation insecurity: Secondary | ICD-10-CM

## 2024-07-23 DIAGNOSIS — Z7409 Other reduced mobility: Secondary | ICD-10-CM | POA: Diagnosis present

## 2024-07-23 DIAGNOSIS — G8929 Other chronic pain: Secondary | ICD-10-CM | POA: Diagnosis present

## 2024-07-23 DIAGNOSIS — Z1152 Encounter for screening for COVID-19: Secondary | ICD-10-CM

## 2024-07-23 LAB — I-STAT CG4 LACTIC ACID, ED: Lactic Acid, Venous: 1.3 mmol/L (ref 0.5–1.9)

## 2024-07-23 MED ORDER — LACTATED RINGERS IV SOLN: INTRAVENOUS | Status: DC

## 2024-07-23 NOTE — ED Provider Notes (Signed)
 [facility] Provider Note   CSN: [id number] Arrival date & time: 07/23/24  1947     Patient presents with: Medication Reaction (Spasticity)   Kathy Owens is a 68 y.o. female.   HPI Patient has history of cerebral palsy.  At baseline she reports that she does a lot of ambulating and transferring with her walker.  She got diagnosed with shingles about a week ago and has seen ophthalmology and been taking Valtrex.  Patient reports that she was tolerating that but then she got started on dantrolene by her PMR physician 9\30 due to adverse effect with baclofen  and Zanaflex .  Patient feels like her symptoms of difficulty with any mobility escalated quickly after starting the dantrolene.  Patient reports she has been compliant with the Valtrex, she continues to have some rash and some redness of the eye but does see improvement.  She has not had any fever nausea or vomiting.    Prior to Admission medications   Medication Sig Start Date End Date Taking? Authorizing Provider  alendronate  (FOSAMAX ) 70 MG tablet Take 1 tablet (70 mg total) by mouth every 7 (seven) days. Take with a full glass of water on an empty stomach. 05/12/24 05/12/25  Trudy Mliss Dragon, MD  atorvastatin  (LIPITOR) 10 MG tablet TAKE 1 TABLET(10 MG) BY MOUTH DAILY 04/01/24   Trudy Mliss Dragon, MD  cyanocobalamin  (VITAMIN B12) 1000 MCG tablet Take 1,000 mcg by mouth daily.    [provider]  cyclobenzaprine  (FLEXERIL ) 5 MG tablet Take 1 tablet (5 mg total) by mouth 3 (three) times daily. For muscle spasms. 04/06/24   Trudy Mliss Dragon, MD  dantrolene (DANTRIUM) 50 MG capsule Take 1 capsule (50 mg total) by mouth at bedtime. X 1 week, then 50 mg BID x 1 week, then 50 mg in AM, and 100 mg at bedtime x 1 week- 100 mg BID- for spasticity- failed Baclofen  and Zanaflex - and Flexeril  07/20/24   Lovorn, Megan, MD  HYDROcodone -acetaminophen  (NORCO/VICODIN) 5-325 MG tablet Take 1  tablet by mouth every 6 (six) hours as needed for moderate pain (pain score 4-6) or severe pain (pain score 7-10). 07/19/24 08/18/24  Trudy Mliss Dragon, MD  loperamide  (IMODIUM ) 2 MG capsule TAKE 1 CAPSULE(2 MG) BY MOUTH DAILY FOR UP TO 360 DOSES AS NEEDED FOR DIARRHEA OR LOOSE STOOLS 03/31/24   Trudy Mliss Dragon, MD  triamterene -hydrochlorothiazide  (MAXZIDE -25) 37.5-25 MG tablet Take 1 tablet by mouth daily. 06/16/24   Trudy Mliss Dragon, MD  valACYclovir (VALTREX) 1000 MG tablet Take 1 tablet (1,000 mg total) by mouth 2 (two) times daily. 07/16/24   Midge Golas, MD    Allergies: Morphine and Vancomycin     Review of Systems  Updated Vital Signs BP 139/86   Pulse 70   Temp 98.2 F (36.8 C) (Oral)   Resp (!) 24   SpO2 100%   Physical Exam Constitutional:      Comments: Patient is alert.  Cheerful mental status.  No respiratory distress  HENT:     Head:     Comments: Scabbing rash over the left forehead and around the eye.    Mouth/Throat:     Pharynx: Oropharynx is clear.  Eyes:     Comments: Injection of the lower aspect of the left sclera.  Extraocular motions intact.  Pupils symmetric.  Cardiovascular:     Rate and Rhythm: Normal rate and regular rhythm.  Pulmonary:     Effort: Pulmonary effort is normal.  Breath sounds: Normal breath sounds.  Abdominal:     General: There is no distension.     Palpations: Abdomen is soft.     Tenderness: There is no abdominal tenderness. There is no guarding.  Musculoskeletal:     Comments: Patient has some deformities of the left upper extremity consistent with some spasticity.  She has some movement use of the extremity.  More normal function and motion of the right upper extremity.  Both lower extremities have some very minor edema.  No active wounds.    Skin:    General: Skin is warm and dry.  Neurological:     Comments: Alert and interactive.  Speech is clear.  Patient has some limitations due to her MS but can use her  right upper extremity reasonably well.  Left upper extremity has some spasticity but she can maneuver it to hold the side rail.  Patient can slightly elevate each lower extremity off of the bed briefly.  Psychiatric:        Mood and Affect: Mood normal.     (all labs ordered are listed, but only abnormal results are displayed) Labs Reviewed  RESP PANEL BY RT-PCR (RSV, FLU A&B, COVID)  [id number]  COMPREHENSIVE METABOLIC PANEL WITH GFR  CBC WITH DIFFERENTIAL/PLATELET  URINALYSIS, ROUTINE W REFLEX MICROSCOPIC  CK  MAGNESIUM  PHOSPHORUS  I-STAT CG4 LACTIC ACID, ED  TROPONIN I (HIGH SENSITIVITY)    EKG: EKG Interpretation Date/Time:  Saturday July 23 2024 23:28:09 EDT Ventricular Rate:  69 PR Interval:  198 QRS Duration:  92 QT Interval:  398 QTC Calculation: 427 R Axis:   -4  Text Interpretation: Sinus rhythm normal, no sig change from previous Confirmed by Armenta Canning [phone]) on 07/23/2024 11:52:25 PM  Radiology: [facility] Chest Port 1 View Result Date: 07/23/2024 CLINICAL DATA:  Weakness.  Medication reaction. EXAM: PORTABLE CHEST 1 VIEW COMPARISON:  05/04/2018 FINDINGS: Lower lung volumes from prior exam. Stable heart size allowing for differences in technique. No confluent opacity, pleural effusion or pneumothorax. No pulmonary edema. Chronic right shoulder arthropathy. IMPRESSION: Lower lung volumes from prior exam. No acute findings. Electronically Signed   By: Andrea Gasman M.D.   On: 07/23/2024 23:14     Procedures   Medications Ordered in the ED  lactated ringers  infusion (has no administration in time range)                                    Medical Decision Making Amount and/or Complexity of Data Reviewed Labs: ordered. Radiology: ordered.  Risk Prescription drug management.   Patient presents outlined.  She is alert with clear mental status.  She has experienced increasing weakness.  Blood pressures are normal.  No fever at this time.  Patient has active  problems of facial zoster with ocular involvement being treated with Valtrex and recent medication change to dantrolene.  Differential diagnosis includes metabolic derangement\infectious etiology\medication reaction.  Will proceed with lab work and chest x-ray.  I-STAT lactic 1.3  Dr. Jerrol EDP to follow-up on remainder of diagnostic results and assess for final disposition. I anticipate probable admission.     Final diagnoses:  None    ED Discharge Orders     None          Armenta Canning, MD 07/23/24 2354

## 2024-07-23 NOTE — ED Triage Notes (Signed)
 Pt has been on Valcyclovir for a week for shingles (lesions are crusted over on her face) and states that she was started on a new medication Dantrolen due to her spasticity and she states that she thinks she is having an adverse reaction to this medication causing her to be unable to ambulate at all.  She normally walks with a walker but she has been unable and states that she is having increased spasticity.

## 2024-07-23 NOTE — ED Provider Notes (Signed)
  Physical Exam  BP 139/86   Pulse 70   Temp 98.2 F (36.8 C) (Oral)   Resp (!) 24   SpO2 100%   Physical Exam  Procedures  Procedures  ED Course / MDM    Medical Decision Making Amount and/or Complexity of Data Reviewed Labs: ordered. Radiology: ordered.  Risk Prescription drug management.   40F with hx of cerebral palsy presenting to the ED with shingles. Recently given Dantrolene, can no longer get up to use her walker due to gradual progressive weakness. Waiting on labs to eval for electrolyte derangement, UTI etc. Likely admission due to progressive weakness, inability to ambulate.

## 2024-07-24 DIAGNOSIS — E876 Hypokalemia: Secondary | ICD-10-CM

## 2024-07-24 DIAGNOSIS — G800 Spastic quadriplegic cerebral palsy: Secondary | ICD-10-CM | POA: Diagnosis not present

## 2024-07-24 DIAGNOSIS — B029 Zoster without complications: Secondary | ICD-10-CM | POA: Insufficient documentation

## 2024-07-24 DIAGNOSIS — R8290 Unspecified abnormal findings in urine: Secondary | ICD-10-CM | POA: Diagnosis not present

## 2024-07-24 DIAGNOSIS — Z87898 Personal history of other specified conditions: Secondary | ICD-10-CM

## 2024-07-24 DIAGNOSIS — Z7409 Other reduced mobility: Secondary | ICD-10-CM | POA: Insufficient documentation

## 2024-07-24 DIAGNOSIS — R531 Weakness: Secondary | ICD-10-CM

## 2024-07-24 LAB — CBC WITH DIFFERENTIAL/PLATELET
Abs Immature Granulocytes: 0.06 K/uL (ref 0.00–0.07)
Basophils Absolute: 0.1 K/uL (ref 0.0–0.1)
Basophils Relative: 1 %
Eosinophils Absolute: 0.1 K/uL (ref 0.0–0.5)
Eosinophils Relative: 1 %
HCT: 41 % (ref 36.0–46.0)
Hemoglobin: 13.7 g/dL (ref 12.0–15.0)
Immature Granulocytes: 1 %
Lymphocytes Relative: 22 %
Lymphs Abs: 1.9 K/uL (ref 0.7–4.0)
MCH: 28.7 pg (ref 26.0–34.0)
MCHC: 33.4 g/dL (ref 30.0–36.0)
MCV: 85.8 fL (ref 80.0–100.0)
Monocytes Absolute: 0.6 K/uL (ref 0.1–1.0)
Monocytes Relative: 7 %
Neutro Abs: 5.9 K/uL (ref 1.7–7.7)
Neutrophils Relative %: 68 %
Platelets: 309 K/uL (ref 150–400)
RBC: 4.78 MIL/uL (ref 3.87–5.11)
RDW: 14 % (ref 11.5–15.5)
WBC: 8.6 K/uL (ref 4.0–10.5)
nRBC: 0 % (ref 0.0–0.2)

## 2024-07-24 LAB — BASIC METABOLIC PANEL WITH GFR
Anion gap: 16 — ABNORMAL HIGH (ref 5–15)
Anion gap: 13 (ref 5–15)
BUN: 22 mg/dL (ref 8–23)
BUN: 22 mg/dL (ref 8–23)
CO2: 21 mmol/L — ABNORMAL LOW (ref 22–32)
CO2: 22 mmol/L (ref 22–32)
Calcium: 9.5 mg/dL (ref 8.9–10.3)
Calcium: 9.1 mg/dL (ref 8.9–10.3)
Chloride: 98 mmol/L (ref 98–111)
Chloride: 98 mmol/L (ref 98–111)
Creatinine, Ser: 1.01 mg/dL — ABNORMAL HIGH (ref 0.44–1.00)
Creatinine, Ser: 0.91 mg/dL (ref 0.44–1.00)
GFR, Estimated: >60 mL/min (ref >60)
GFR, Estimated: >60 mL/min (ref >60)
Glucose, Bld: 112 mg/dL — ABNORMAL HIGH (ref 70–99)
Glucose, Bld: 104 mg/dL — ABNORMAL HIGH (ref 70–99)
Potassium: 3.1 mmol/L — ABNORMAL LOW (ref 3.5–5.1)
Potassium: 3.4 mmol/L — ABNORMAL LOW (ref 3.5–5.1)
Sodium: 135 mmol/L (ref 135–145)
Sodium: 133 mmol/L — ABNORMAL LOW (ref 135–145)

## 2024-07-24 LAB — HIV ANTIBODY (ROUTINE TESTING W REFLEX): HIV Screen 4th Generation wRfx: NONREACTIVE

## 2024-07-24 LAB — PHOSPHORUS: Phosphorus: 4.4 mg/dL (ref 2.5–4.6)

## 2024-07-24 LAB — COMPREHENSIVE METABOLIC PANEL WITH GFR
ALT: 25 U/L (ref 0–44)
AST: 22 U/L (ref 15–41)
Albumin: 3.9 g/dL (ref 3.5–5.0)
Alkaline Phosphatase: 58 U/L (ref 38–126)
Anion gap: 14 (ref 5–15)
BUN: 21 mg/dL (ref 8–23)
CO2: 23 mmol/L (ref 22–32)
Calcium: 9.5 mg/dL (ref 8.9–10.3)
Chloride: 97 mmol/L — ABNORMAL LOW (ref 98–111)
Creatinine, Ser: 0.97 mg/dL (ref 0.44–1.00)
GFR, Estimated: >60 mL/min (ref >60)
Glucose, Bld: 102 mg/dL — ABNORMAL HIGH (ref 70–99)
Potassium: 3.3 mmol/L — ABNORMAL LOW (ref 3.5–5.1)
Sodium: 134 mmol/L — ABNORMAL LOW (ref 135–145)
Total Bilirubin: 1 mg/dL (ref 0.0–1.2)
Total Protein: 7.1 g/dL (ref 6.5–8.1)

## 2024-07-24 LAB — URINALYSIS, ROUTINE W REFLEX MICROSCOPIC
Bilirubin Urine: NEGATIVE
Glucose, UA: NEGATIVE mg/dL
Ketones, ur: NEGATIVE mg/dL
Nitrite: NEGATIVE
Protein, ur: NEGATIVE mg/dL
Specific Gravity, Urine: 1.01 (ref 1.005–1.030)
pH: 7 (ref 5.0–8.0)

## 2024-07-24 LAB — CBC
HCT: 40.9 % (ref 36.0–46.0)
Hemoglobin: 13.8 g/dL (ref 12.0–15.0)
MCH: 29.1 pg (ref 26.0–34.0)
MCHC: 33.7 g/dL (ref 30.0–36.0)
MCV: 86.1 fL (ref 80.0–100.0)
Platelets: 249 K/uL (ref 150–400)
RBC: 4.75 MIL/uL (ref 3.87–5.11)
RDW: 14.2 % (ref 11.5–15.5)
WBC: 9.2 K/uL (ref 4.0–10.5)
nRBC: 0 % (ref 0.0–0.2)

## 2024-07-24 LAB — SEDIMENTATION RATE: Sed Rate: 15 mm/h (ref 0–22)

## 2024-07-24 LAB — MAGNESIUM
Magnesium: 1.6 mg/dL — ABNORMAL LOW (ref 1.7–2.4)
Magnesium: 1.6 mg/dL — ABNORMAL LOW (ref 1.7–2.4)

## 2024-07-24 LAB — RESP PANEL BY RT-PCR (RSV, FLU A&B, COVID)  RVPGX2
Influenza A by PCR: NEGATIVE
Influenza B by PCR: NEGATIVE
Resp Syncytial Virus by PCR: NEGATIVE
SARS Coronavirus 2 by RT PCR: NEGATIVE

## 2024-07-24 LAB — CK: Total CK: 69 U/L (ref 38–234)

## 2024-07-24 LAB — C-REACTIVE PROTEIN: CRP: <0.5 mg/dL (ref <1.0)

## 2024-07-24 LAB — TROPONIN I (HIGH SENSITIVITY): Troponin I (High Sensitivity): 8 ng/L

## 2024-07-24 MED ORDER — ENOXAPARIN SODIUM 40 MG/0.4ML IJ SOSY
40.0000 mg | PREFILLED_SYRINGE | Freq: Every day | INTRAMUSCULAR | Status: DC
  Administered 2024-07-24 – 2024-08-01 (×9): 40 mg via SUBCUTANEOUS
  Filled 2024-07-24 (×9): qty 0.4

## 2024-07-24 MED ORDER — MAGNESIUM OXIDE -MG SUPPLEMENT 400 (240 MG) MG PO TABS
400.0000 mg | ORAL_TABLET | Freq: Once | ORAL | Status: AC
  Administered 2024-07-24: 400 mg via ORAL
  Filled 2024-07-24: qty 1

## 2024-07-24 MED ORDER — POTASSIUM CHLORIDE CRYS ER 20 MEQ PO TBCR
20.0000 meq | EXTENDED_RELEASE_TABLET | Freq: Once | ORAL | Status: AC
  Administered 2024-07-24: 20 meq via ORAL
  Filled 2024-07-24: qty 1

## 2024-07-24 MED ORDER — DANTROLENE SODIUM 25 MG PO CAPS
50.0000 mg | ORAL_CAPSULE | Freq: Two times a day (BID) | ORAL | Status: DC
Start: 2024-07-24 — End: 2024-07-24

## 2024-07-24 MED ORDER — VALACYCLOVIR HCL 500 MG PO TABS
1000.0000 mg | ORAL_TABLET | Freq: Two times a day (BID) | ORAL | Status: AC
  Administered 2024-07-24 – 2024-07-30 (×14): 1000 mg via ORAL
  Filled 2024-07-24 (×14): qty 2

## 2024-07-24 MED ORDER — ENSURE PLUS HIGH PROTEIN PO LIQD
237.0000 mL | Freq: Two times a day (BID) | ORAL | Status: DC
Start: 2024-07-24 — End: 2024-08-04
  Administered 2024-07-24 – 2024-08-04 (×20): 237 mL via ORAL
  Filled 2024-07-24: qty 237

## 2024-07-24 MED ORDER — POTASSIUM CHLORIDE CRYS ER 20 MEQ PO TBCR: 40.0000 meq | EXTENDED_RELEASE_TABLET | Freq: Once | ORAL | Status: DC

## 2024-07-24 MED ORDER — FOSFOMYCIN TROMETHAMINE 3 G PO PACK
3.0000 g | PACK | Freq: Once | ORAL | Status: AC
  Administered 2024-07-24: 3 g via ORAL
  Filled 2024-07-24: qty 3

## 2024-07-24 MED ORDER — ATORVASTATIN CALCIUM 10 MG PO TABS
10.0000 mg | ORAL_TABLET | Freq: Every day | ORAL | Status: DC
  Administered 2024-07-24 – 2024-08-04 (×12): 10 mg via ORAL
  Filled 2024-07-24 (×12): qty 1

## 2024-07-24 MED ORDER — LOPERAMIDE HCL 2 MG PO CAPS
2.0000 mg | ORAL_CAPSULE | Freq: Every day | ORAL | Status: DC
  Administered 2024-07-24 – 2024-08-04 (×12): 2 mg via ORAL
  Filled 2024-07-24 (×12): qty 1

## 2024-07-24 MED ORDER — INFLUENZA VAC SPLIT HIGH-DOSE 0.5 ML IM SUSY
0.5000 mL | PREFILLED_SYRINGE | INTRAMUSCULAR | Status: AC
  Administered 2024-07-26: 0.5 mL via INTRAMUSCULAR
  Filled 2024-07-24: qty 0.5

## 2024-07-24 MED ORDER — CYCLOBENZAPRINE HCL 5 MG PO TABS
5.0000 mg | ORAL_TABLET | Freq: Three times a day (TID) | ORAL | Status: DC
  Administered 2024-07-24 – 2024-08-04 (×34): 5 mg via ORAL
  Filled 2024-07-24 (×34): qty 1

## 2024-07-24 MED ORDER — HYDROCODONE-ACETAMINOPHEN 5-325 MG PO TABS: 1.0000 | ORAL_TABLET | Freq: Four times a day (QID) | ORAL | Status: DC | PRN

## 2024-07-24 NOTE — Progress Notes (Signed)
 HD#0 SUBJECTIVE:  Patient Summary: Kathy Owens is a 68 y.o. with a pertinent PMH of cerebral palsy and active herpes zoster being treated with Valtrex who presented with concerns of increased spasticity and admitted for observation.   Overnight Events: None  Interim History: Patient is evaluated at bedside. Resting comfortably in bed. She has no concerns. Denies HA, muscle pain, chest/abdominal pain, or SOB. She does note that she would like to improve her strength before discharging.   OBJECTIVE:  Vital Signs: Vitals:   07/24/24 0314 07/24/24 0315 07/24/24 0734 07/24/24 1158  BP: (!) 153/62  132/68 137/69  Pulse: 66  64 78  Resp: 17  16 16   Temp: 98.5 F (36.9 C)  (!) 97.4 F (36.3 C) 97.9 F (36.6 C)  TempSrc: Oral   Oral  SpO2:   98% 99%  Weight:  67.3 kg    Height: 5' (1.524 m)      Supplemental O2: Room Air SpO2: 99 % O2 Flow Rate (L/min): 0 L/min  Filed Weights   07/24/24 0315  Weight: 67.3 kg     Intake/Output Summary (Last 24 hours) at 07/24/2024 1450 Last data filed at 07/24/2024 0053 Gross per 24 hour  Intake 125 ml  Output --  Net 125 ml   Net IO Since Admission: 125 mL [07/24/24 1450]  Physical Exam: Physical Exam Constitutional:      Appearance: Normal appearance.  HENT:     Head: Normocephalic and atraumatic.     Comments: dried crusted lesions in left V1 distribution above eyebrow/below eyelid without serous drainage. No ocular involvement.    Nose: Nose normal.     Mouth/Throat:     Mouth: Mucous membranes are moist.  Cardiovascular:     Rate and Rhythm: Normal rate and regular rhythm.     Pulses: Normal pulses.     Heart sounds: Normal heart sounds.  Pulmonary:     Effort: Pulmonary effort is normal.     Breath sounds: Normal breath sounds.  Abdominal:     General: Abdomen is flat. Bowel sounds are normal.     Palpations: Abdomen is soft.  Musculoskeletal:     Comments: Sensation intact bilaterally. 5/5 grip strength bilaterally.  4/5 bilateral UE strength with increased tone. 3/5 RLE strength, plantarflexion, and dorsiflexion. 2/5 LLE strength, plantarflexion, and dorsiflexion. Increased tone of LLE and increased resistance to knee flexion when compared to right.   Skin:    General: Skin is warm and dry.  Neurological:     General: No focal deficit present.     Mental Status: She is alert and oriented to person, place, and time.  Psychiatric:        Mood and Affect: Mood normal.        Behavior: Behavior normal.    Patient Lines/Drains/Airways Status     Active Line/Drains/Airways     Name Placement date Placement time Site Days   Peripheral IV 07/23/24 20 G 1 Right Antecubital 07/23/24  2320  Antecubital  1            Pertinent labs and imaging:      Latest Ref Rng & Units 07/24/2024    4:27 AM 07/23/2024   11:21 PM 07/13/2024    8:16 PM  CBC  WBC 4.0 - 10.5 K/uL 9.2  8.6  4.6   Hemoglobin 12.0 - 15.0 g/dL 86.1  86.2  86.3   Hematocrit 36.0 - 46.0 % 40.9  41.0  40.9   Platelets 150 -  400 K/uL 249  309  214        Latest Ref Rng & Units 07/24/2024   11:00 AM 07/24/2024    4:27 AM 07/23/2024   11:21 PM  CMP  Glucose 70 - 99 mg/dL 895  887  897   BUN 8 - 23 mg/dL 22  22  21    Creatinine 0.44 - 1.00 mg/dL 9.08  8.98  9.02   Sodium 135 - 145 mmol/L 133  135  134   Potassium 3.5 - 5.1 mmol/L 3.4  3.1  3.3   Chloride 98 - 111 mmol/L 98  98  97   CO2 22 - 32 mmol/L 22  21  23    Calcium  8.9 - 10.3 mg/dL 9.1  9.5  9.5   Total Protein 6.5 - 8.1 g/dL   7.1   Total Bilirubin 0.0 - 1.2 mg/dL   1.0   Alkaline Phos 38 - 126 U/L   58   AST 15 - 41 U/L   22   ALT 0 - 44 U/L   25     DG Chest Port 1 View Result Date: 07/23/2024 CLINICAL DATA:  Weakness.  Medication reaction. EXAM: PORTABLE CHEST 1 VIEW COMPARISON:  05/04/2018 FINDINGS: Lower lung volumes from prior exam. Stable heart size allowing for differences in technique. No confluent opacity, pleural effusion or pneumothorax. No pulmonary edema.  Chronic right shoulder arthropathy. IMPRESSION: Lower lung volumes from prior exam. No acute findings. Electronically Signed   By: Andrea Gasman M.D.   On: 07/23/2024 23:14    ASSESSMENT/PLAN:  Assessment: Principal Problem:   Muscle spasticity Active Problems:   Cerebral palsy (HCC)   Chronic pain due to cerebral palsy   Chronic diarrhea of unknown origin   Impaired mobility   History of progressive weakness   Zoster, V1 distribution without ocular involvement  Kathy Owens is a 68 y.o. with a pertinent PMH of cerebral palsy and active herpes zoster being treated with Valtrex who presented with concerns of increased spasticity and admitted for observation.    Plan: #Generalized Muscle Spasticity  #Cerebral spasticity  Patient reporting increased stiffness causing weakness since starting Dantrolene 50mg . Previously on Flexeril  alone to help with spasms, however has a history of intolerance to Baclofen  and Tizanidine  with side effects of worsened weakness. Prior to starting Dantrolene, patient was mobile with the aid of a rollator walker. Recently was at [facility] rehab in 01/2024 to improve strength and spasticity. Sees Dr. Lovorn with PM&R but has not been able to be assessed in person due to patient's transportation issues. A wheelchair was suggested in 01/2024, however patient believed her strength to have improved with rehab, and declined wheelchair at this time. Etiology for weakness likely multifactorial, including new medications (Dantrolene, Valtrex), recent infection with HSV exacerbating CNS, progression of disease, metabolic derangement (low magnesium, potassium), or aging. Will replenish electrolytes as appropriate. Denies bowel/urinary incontinence, shortness of breath, headaches, or fevers. Will continue Flexeril  for spasms but hold other antispasmodics.  - Continue to monitor BMP, Mag - Hold Dantrolene - Continue Flexeril  - PT/OT recommending acute rehab facility -  Patient needs in-person follow up with PM&R to assess for weakness   #Impaired Mobility #History of Progressive weakness  Seems that patient has not been able to make multiple visits to clinic in person due to weakness and mobility issues with contractures starting beginning of this year being noted. Worsened when patient was on increasing doses of baclofen .  Patient states that taking dantrolene  also worsened weakness. Could be that medications are contributing to weakness, but also could be that this is just progressive weakness from contractures that have caused deconditioning.    #Zoster, V1 distribution without ocular involvement  Diagnosed 9/27 and was started on Valtrex 1000mg  BID for renal dosing. Completed 7/14 days. Ophthalmology saw patient in ED on 9/27 and ruled out any ocular involvement but did note elevated intraocular pressure that will need to be followed up outpatient. On physical exam, lesions are crusted over in V1 distribution on left side of face without serous drainage.  - Continue valacyclovir 1000 mg BID   #Hypokalemia #Hypomagnesemia  Found POA with potassium of 3.3 and magnesium of 1.6. Replaced with Kcl 20 mEq x 2 doses and 400 mg Mg  x 2 doses. Patient has history of chronic diarrhea treated with imodium  which could be contributing. Will continue to monitor.  - BMP, Mag  - Continue loperamide  2mg  daily   #Chronic pain due to cerebral palsy  On home hydrocodone  - acetaminophen  5-235 mg tablet. Will continue inpatient.   #hyperlipidemia  Continue Lipitor 10 mg.  Best Practice: Diet: Regular diet IVF: Fluids: none VTE: enoxaparin  (LOVENOX ) injection 40 mg Start: 07/24/24 1000 SCDs Start: 07/24/24 0200 Code: DNR/DNI  Disposition planning: Therapy Recs: SNF Family Contact: sister Tiney) DISPO: Anticipated discharge tomorrow to Rehab pending Insurance for SNF coverage.  Signature:  Christle Nolting, DO [facility]  2:50 PM,  07/24/2024  On Call pager [phone]

## 2024-07-24 NOTE — Discharge Instructions (Addendum)
 Kathy Owens,  You were recently admitted to [facility] for increased stiffness and weakness. It may be due to the new medicine you recently started, called Dantrolene. It also could be worsened by this Herpes virus you are fighting with the Valtrex. It is a good idea to see Dr. Jyl office soon so you can talk with her about your weakness and how it has been getting worse for you.  Continue taking your home medications with the following changes:  Stop taking: Valacyclovir (Valtrex) 1000mg  tablet. You finished the course of this medicine while in the hospital, and do not need to take the pills once you return home. Continue taking: Acetaminophen  500mg  tablet. Take 2 tablets as needed for headache or fever Alendronate  (Fosamax ) 70mg  tablet. Take one tablet weekly for osteoporosis. Atorvastatin  10mg  tablet, take one tablet daily for your cholesterol. Cyclobenzaprine  (Flexeril ) 5mg  tablet. Take 1 tablet up to 3 times daily for muscle spasms.  Dantrolene 25mg  tablet. Take 1 tablet twice daily to help decrease your spasticity. Hydrocodone -Acetaminophen  (Norco). Take 1 tablet every 6 hours as needed for pain Loperamide  (Imodium ) 2mg  capsule. Take 1 capsule daily for diarrhea or loose stools Triamterene -hydrochlorothiazide  (Maxzide ) 37.5-25mg  tablet. Take one tablet daily.  You should seek further medical care if you develop any new confusion, any fevers >101F, or any increasingly worse weakness.  We have scheduled an appointment with Dr. Trudy for you to follow up your hospital visit on this day. We also recommend that you see Dr. Cornelio in person after your stay at inpatient rehab so she can follow up your strength and see how you are doing.  We are so glad that you are feeling better.  Sincerely, Shakyia Bosso, DO

## 2024-07-24 NOTE — Hospital Course (Addendum)
 Assessment & Plan by Problem: Principal Problem:   Muscle spasticity Active Problems:   Cerebral palsy (HCC)   Chronic pain due to cerebral palsy   Chronic diarrhea of unknown origin   Impaired mobility   History of progressive weakness   Zoster, V1 distribution without ocular involvement  Kathy Owens is a 68 y.o. with a pertinent PMH of cerebral palsy and recently treated herpes zoster (Valtrex) who presented with concerns of increased spasticity and admitted for observation.    #Generalized Muscle Spasticity  #Cerebral spasticity  Patient reports that since starting Dantrolene 50mg  in 06/2024, she has been having increasing stiffness which has caused her weakness. Previously, patient was only taking Flexeril  to help with spasms and pain, but has trialed multiple antispasmodics, including baclofen  and tizanidine , which have all resulted in side effects of worsened weakness. Cyproheptadine can cause raise IOP and patient already has concern for this since ZVZ infection diagnosis. Diazepam  given age is likely not good treatment option. ESR and CRP negative. Prior to Dantrolene, patient could ambulate with the aid of a rollator and walker. Recently was at [facility] rehab in 01/2024 to improve strength and spasticity. Sees Dr. Lovorn with PM&R but has not been able to be assessed in person prior to admission due to patient's transportation issues. A wheelchair was suggested in 01/2024, however patient believed her strength to have improved with rehab, and declined wheelchair at this time. Etiology for weakness likely multifactorial, including new medications (Dantrolene, Valtrex), recent infection with HSV exacerbating CNS, progression of disease, metabolic derangement (low magnesium, potassium), or aging. Patient without leukocytosis or altered mental status on presentation. Denies bowel/urinary incontinence, shortness of breath, headaches, or fevers. Continued Flexeril  for spasms but held  other antispasmodics. Consulted PT/OT, SW, and PM&R, who all recommend CIR placement. Dr. Cornelio evaluated patient for CIR admission, believed patient to be appropriate candidate for inpatient rehab for strengthening and a power wheelchair evaluation. However,  needed to consider possible acute myelopathy, stroke, or other contributing pathology for [facility] signs. Ordered MRI brain, Cervical spine, Thoracic spine, and Lumbar spine. Recommended added Baclofen  5mg  BID to help with spasticity, however patient concerned about increased sedation and would like to not restart this medicine. MRI Brain showed no acute intracranial abnormality/stroke and chronic microvascular ischemic changes. MR Cervical spine showed moderate to severe left and moderate right C3-C4 foraminal stenosis, and severe left and right foraminal stenosis of C4-C5 with mild canal stenosis. MR thoracic spine shows no stenosis, however partial visualization of L1-L2 shows moderate stenosis at or near the tip of the conus medullaris. NSGY consulted by PM&R for consult, as stenosis of cervical spine should not be contributing to patient's increased weakness or spasticity of her lower extremities, however severe lumbar canal stenosis would contribute to LE weakness. Also consulted Neurology to evaluate patient, recommended checking ionized calcium  and phosphorus, as well as trialing other medications like Clonazepam, Methocarbamol , or outpatient Botox or baclofen  pump. Ordered MRI Lumar spine, which showed broad disc bulge with ligamentum flavum thickening, resulting in severe canal stenosis at L1-L2, and lateral far right disc protrusion closely approximating the exiting right nerve. Patient does not want surgical intervention per NSGY. Restarted Dantrolene 25mg  BID.    #Impaired Mobility #History of Progressive weakness  Patient has not been able to make multiple visits to clinic in person due to weakness and mobility issues with contractures starting  beginning of this year. Worsened when patient was on increased doses of Baclofen  or Dantrolene. Medications  could be contributing to weakness, could also be progressive weakness from contractures that have caused deconditioning. PT/OT consulted and recommended CIR. MRI brain, C-spine, and T-spine without causative pathology.    #Zoster, V1 distribution without ocular involvement  Diagnosed 9/27 and was started on Valtrex 1000mg  BID for renal dosing. Completed 14/14 days while admitted. Ophthalmology saw patient in ED on 9/27 and ruled out any ocular involvement but did note elevated intraocular pressure that will need to be followed up outpatient. Continued eye drops for irritation caused by crust falling into eyes, and Eucerin cream for dryness of skin.    #Hypokalemia #Hypomagnesemia  Found POA with potassium of 3.3 and magnesium of 1.6. Replaced with Kcl 20 mEq x 2 doses and 400 mg Mg  x 2 doses. Patient has history of chronic diarrhea treated with imodium  which could be contributing. Monitored inpatient and replenished electrolytes as appropriate. Continued home loperamide  2mg  daily.   #Chronic pain due to cerebral palsy  Patient prescribed hydrocodone -acetaminophen  5-235 mg tablet outpatient. Continued inpatient.     #Hyperlipidemia  Continued Lipitor 10 mg      10/13 She is feeling better and slept well last night. She is still having irritation in her right eye and has been using clear eyes for symptoms. She denies any pain this morning.   10/14: Still experiencing blurriness which started on admission. Feels like her eye is swollen. She denies pain and pressure in her left eye. She does not have any nause. She has been eating as normal.

## 2024-07-24 NOTE — ED Notes (Signed)
 Pt unable to stand with assistance, let alone ambulate with walker.

## 2024-07-24 NOTE — H&P (Addendum)
 Date: 07/24/2024               Patient Name:  Kathy Owens MRN: [id number]  DOB: 10-May-1956 Age / Sex: 68 y.o., female   PCP: Trudy Mliss Dragon, MD         Medical Service: Internal Medicine Teaching Service         Attending Physician: Dr. Jone Dauphin      First Contact: Lamonte Penning, DO}    Second Contact: Dr. Norman Lobstein, DO         Pager Information: First Contact Pager: [phone]   Second Contact Pager: [phone]   SUBJECTIVE   Chief Complaint: increased stiffness  History of Present Illness: Kathy Owens is a 68 y.o. female with PMH of cerebral palsy, recent history of herpes zoster on valtrex presents today with concerns of increased spasticity that has been going on for the last two weeks. Patient is currently being treated for shingles in left V1 distribution of the face and patient was being treated with valtrex 1000mg  2x a day. Patient states that over the last two weeks she has gotten increasingly stiff but patient states that before this she was walking up and down the halls of her living facility with the help of a rollator and walker. She was also able to do things like washing her own dishes. She states that the living facility she is in is a senior care for patients that have mobility issues. Patient was recently started on around 9/29 per note from PM&R Dr. Lovorn on Dantrolene 50 mg at bedtime which patient states that she has been taking once a day. She states that what triggered the change in medications was that her sister talked to PM&R doctor that patient's stiffness was getting worse. As patient puts it, when dantrolene was started, it was like patient was back to square one with issues of increased spasticity. It seems that patient had some dizziness, possible hallucinations with baclofen  before. Patient was tried on Zanaflex , which had some side effects which are unknown. These were medications that were tried for patient's spasticity associated with CP.  Flexeril  was being prescribed for muscle spasms and patient reports that she was able to walk and function well with the help of walking aids such rollator with just this medication. Patient denies any dysuria, or increased urinary frequency. Also denies any bowel or bladder incontinence.    ED Course: Labs significant for K 3.3 Mg 1.6  WBC 8.6 UA trace leukocytes, many bacteria  CK 69 WNL  lactic acid WNL Imaging CXR showed lower lung volumes likely due to positioning compared to prior exam Received Fosfomycin 3 g , K+ 20 meq 400 mg mag ox  flu shot     Past Medical History Cerebral palsy Chronic pain due to cerebral palsy Hypertension Anterior ischemic optic neuropathy bilateral Venous insufficiency of left leg Vertigo Hyperlipidemia Osteoporosis  Past Surgical History Past Surgical History:  Procedure Laterality Date   JOINT REPLACEMENT Right     Meds:  No outpatient medications have been marked as taking for the 07/23/24 encounter [facility] Encounter).  Alendronate  70 mg Cyanocobalamin  mean 1000 mcg daily Triamterene -hydrochlorothiazide  37.5-25 Norco 5-325 g Valacyclovir 1000 mg 2 times daily for 2 weeks Atorvastatin  10 mg Imodium  2 mg Cyclobenzaprine  5 mg 3 times daily Dantrolene 50 mg once daily at bedtime  Social:  Lives With: in nursing facility  Occupation: not working Support:Sister daisy  Level of Function:pt states she was able to wash dishes  and walk around with the help of DME  PCP:  Trudy Mliss Dragon, MD  Substances: -Tobacco: unknown  -Alcohol: unknown  -Recreational Drug: unknown   Family History:  Family History  Problem Relation Age of Onset   Heart failure Father        Died at the age of 26.  Was a smoker.   Alcohol abuse Sister        She is 10 years younger than Kathy Owens     Allergies: Allergies as of 07/23/2024 - Review Complete 07/23/2024  Allergen Reaction Noted   Morphine Other (See Comments) 07/10/2015   Vancomycin  Hives  11/06/2018    Review of Systems: A complete ROS was negative except as per HPI.   OBJECTIVE:   Physical Exam: Blood pressure 130/86, pulse 74, temperature 98.2 F (36.8 C), temperature source Oral, resp. rate (!) 29, SpO2 100%.  Constitutional: well-appearing female sitting in bed, in no acute distress HENT: dried crusted lesions in V1 distribution above eyebrow and below eye lid  Eyes: conjunctival injection in left eye  Cardiovascular: regular rate and rhythm, no m/r/g Pulmonary/Chest: normal work of breathing on room air, lungs clear to auscultation bilaterally Abdominal: soft, non-tender, non-distended MSK: non pitting edema LLE> RLE, grip strength 5/5 bilaterally, 4/5 bilateral upper extremity with increased tone bilaterally. Increased tone in left lower extremity with increased resistance to flexion of the knee compared to the right lower extremity. 3/5 in right lower extremity strength and 2/5 left lower extremity strength. 3/5 plantarflexion and dorsiflexion on the right and 2/5 plantarflexion and dorsiflexion on the left. Sensation intact bilaterally.   DP pulses palpable and skin is warm  Neurological: alert & oriented x 3 Skin: warm and dry Psych: pleasant   Labs: CBC    Component Value Date/Time   WBC 8.6 07/23/2024 2321   RBC 4.78 07/23/2024 2321   HGB 13.7 07/23/2024 2321   HGB 13.8 05/26/2019 1120   HCT 41.0 07/23/2024 2321   HCT 43.0 05/26/2019 1120   PLT 309 07/23/2024 2321   PLT 270 05/26/2019 1120   MCV 85.8 07/23/2024 2321   MCV 91 05/26/2019 1120   MCH 28.7 07/23/2024 2321   MCHC 33.4 07/23/2024 2321   RDW 14.0 07/23/2024 2321   RDW 13.6 05/26/2019 1120   LYMPHSABS 1.9 07/23/2024 2321   LYMPHSABS 1.3 05/26/2019 1120   MONOABS 0.6 07/23/2024 2321   EOSABS 0.1 07/23/2024 2321   EOSABS 0.1 05/26/2019 1120   BASOSABS 0.1 07/23/2024 2321   BASOSABS 0.0 05/26/2019 1120     CMP     Component Value Date/Time   NA 134 (L) 07/23/2024 2321   NA 143  10/28/2023 1417   K 3.3 (L) 07/23/2024 2321   CL 97 (L) 07/23/2024 2321   CO2 23 07/23/2024 2321   GLUCOSE 102 (H) 07/23/2024 2321   BUN 21 07/23/2024 2321   BUN 35 (H) 10/28/2023 1417   CREATININE 0.97 07/23/2024 2321   CREATININE 1.12 (H) 05/10/2015 1157   CALCIUM  9.5 07/23/2024 2321   PROT 7.1 07/23/2024 2321   PROT 7.0 07/26/2020 0934   ALBUMIN 3.9 07/23/2024 2321   ALBUMIN 4.7 07/26/2020 0934   AST 22 07/23/2024 2321   ALT 25 07/23/2024 2321   ALKPHOS 58 07/23/2024 2321   BILITOT 1.0 07/23/2024 2321   BILITOT 0.5 07/26/2020 0934   GFRNONAA >60 07/23/2024 2321   GFRNONAA 54 (L) 05/10/2015 1157   GFRAA 73 07/26/2020 0934   GFRAA 62 05/10/2015 1157  Imaging:  DG Chest Port 1 View Result Date: 07/23/2024 CLINICAL DATA:  Weakness.  Medication reaction. EXAM: PORTABLE CHEST 1 VIEW COMPARISON:  05/04/2018 FINDINGS: Lower lung volumes from prior exam. Stable heart size allowing for differences in technique. No confluent opacity, pleural effusion or pneumothorax. No pulmonary edema. Chronic right shoulder arthropathy. IMPRESSION: Lower lung volumes from prior exam. No acute findings. Electronically Signed   By: Andrea Gasman M.D.   On: 07/23/2024 23:14     EKG: personally reviewed my interpretation is sinus rhythm with no acute ischemic changes   ASSESSMENT & PLAN:   Assessment & Plan by Problem: Principal Problem:   Muscle spasticity Active Problems:   Cerebral palsy (HCC)   Chronic pain due to cerebral palsy   Chronic diarrhea of unknown origin   Impaired mobility   History of progressive weakness   Zoster, V1 distribution without ocular involvement  Kathy Owens is a 68 y.o. female with PMH of cerebral palsy, recent history of herpes zoster on valtrex presents today with concerns of increased spasticity that has been going on for the last two weeks and is admitted for continued work up of this and management   #Generalized Muscle Spasticity  #Cerebral  spasticity  Patient reports that since taking dantrolene 50 mg she has been having increasing stiffness which has caused her weakness.  Per chart review the patient was just on Flexeril  prior to this although she had been tried on baclofen  and tizanidine  and side effects that worsened weakness.  Per patient before she was started on dantrolene she was able to walk around with the aid of a rollator and walker.  Per chart review, from last visit with PM&R in April 2025, patient's physical exam was similar in the sense of strength,  and clonus bilaterally with suggestion for wheelchair.  Although patient reports improvement of this baseline when she went to rehab center after this, do wonder if this is progression of patient's baseline and that medications for spasticity are causing weakness. Also could have zoster infection causing worsening progression acutely. With vital signs stable, seemingly baseline mental status patient being afebrile, and no leukocytosis,  less differential for infectious causes such as  myelitis, encephalitis, and abscess on spinal cord. Patient denying any red flag symptoms like bowel or urinary incontinence that could lead us  to think about spinal cord compression. Patient denies any recent history of falls and is not on blood thinners so less likely for intracerebral hemorrhage and no focal deficits that would suggest ischemic stroke.  - Consider PM&R consult in the AM - Will keep patient on just flexeril  5 mg TID, but will hold any other antispasmodics. Could consider up titration the dantrolene to see if it could help but could also cause worsening of patient's weakness with increasing doses. Cyproheptadine can cause raise IOP and patient already has concern for this. Diazepam  given age is likely not good treatment option.  -Follow ESR and CRP <.5 -As we have lower differential for infection or spinal cord compression, will hold off on MRI w contrast of thoracic lumbar spine but  could consider if symptoms persist/worsen   #Impaired Mobility #History of Progressive weakness  Seems that patient has not been able to make multiple visits to clinic in person due to weakness and mobility issues with contractures starting beginning of this year being noted. Worsened when patient was on increasing doses of baclofen .  Patient states that taking dantrolene also worsened weakness. Could be that medications are contributing to  weakness, but also could be that this is just progressive weakness from contractures that have caused deconditioning.  - pt/ot ordered   #Zoster, V1 distribution without ocular involvement  Patient has completed 7 days of course. Ophthalmology saw patient in ED on 9/27 and ruled out any ocular involvement but did note elevated intraocular pressure that will need to be followed up outpatient. On physical exam, lesions are crusted over in V1 distribution on left side of face.  -valacyclovir 1000 mg two times daily   #Hypokalemia #Hypomagnesemia  - K+  replaced with 20 meq and 400 mg Mg  for K of 3.3 and Magnesium of 1.6   #Chronic pain due to cerebral palsy  -patient is being prescribed hydrocodone  - acetaminophen  5-235 mg tablet as an outpatient. Will continue inpatient   #hyperlipidemia  Continue Lipitor 10 mg Best practice: Diet: Regular, ensure supplements liquid  VTE: DOAC IVF: none Code: DNR/DNI  Disposition planning: Prior to Admission Living Arrangement: senior housing  Anticipated Discharge Location: senior housing  Barriers to Discharge: continued work up   Dispo: Admit patient to Observation with expected length of stay less than 2 midnights.  Signed: D'Mello, Lenix Kidd, DO Internal Medicine Resident  07/24/2024, 3:30 AM  Please contact IM Residency On-Call Pager at: [phone] or [phone].

## 2024-07-24 NOTE — Plan of Care (Signed)

## 2024-07-24 NOTE — Care Management Obs Status (Signed)
 MEDICARE OBSERVATION STATUS NOTIFICATION   Patient Details  Name: Kathy Owens MRN: [id number] Date of Birth: April 24, 1956   Medicare Observation Status Notification Given:  Yes    Deshayla Empson G., RN 07/24/2024, 12:59 PM

## 2024-07-24 NOTE — Evaluation (Signed)
 Physical Therapy Evaluation Patient Details Name: Kathy Owens MRN: [id number] DOB: October 08, 1956 Today's Date: 07/24/2024  History of Present Illness  Pt is a 68 y/o F presenting to ED on 10/4 with incr spasticity x2 weeks. PMH includes cerebral palsy, herpes zoster on valtrex, HTN, anterior ischemic bilateral optic neuropathy, vertigo, HLD, osteoporosis  Clinical Impression  PTA pt was ModI with RW in the home and rollator in the community. Intermittent help from her sister for iADLs. Pt presents with increased tone in L>R LE with feet positioned in supination. Pt was unable to flex knees or keep feet flat on the ground for standing attempts. Attempted to stand with MaxAx2 and 2HH, however, pt had a significant posterior lean with minimal ability to clear hips. At this time, recommending <3hrs post acute rehab to work towards independence with mobility. Acute PT to follow to address functional impairments listed below.         If plan is discharge home, recommend the following: A lot of help with walking and/or transfers;A lot of help with bathing/dressing/bathroom;Assistance with cooking/housework;Assist for transportation;Help with stairs or ramp for entrance   Can travel by private vehicle   No    Equipment Recommendations Other (comment) (TBA with progress)     Functional Status Assessment Patient has had a recent decline in their functional status and demonstrates the ability to make significant improvements in function in a reasonable and predictable amount of time.     Precautions / Restrictions Precautions Precautions: Fall Precaution/Restrictions Comments: hx of CP with spasticity Restrictions Weight Bearing Restrictions Per Provider Order: No      Mobility  Bed Mobility Overal bed mobility: Needs Assistance Bed Mobility: Supine to Sit, Sit to Supine    Supine to sit: Mod assist, +2 for physical assistance Sit to supine: Max assist, +2 for physical assistance    General bed mobility comments: assist with bed pad to bring pt EOB on R side    Transfers Overall transfer level: Needs assistance Equipment used: 2 person hand held assist Transfers: Sit to/from Stand Sit to Stand: Max assist, +2 physical assistance    General transfer comment: max +2, unable to achieve full standing due to incr rigidity in L >R LE      Balance Overall balance assessment: Needs assistance Sitting-balance support: Feet supported Sitting balance-Leahy Scale: Poor Sitting balance - Comments: cannot reach outside BOS without LOB, mod A posterior support   Standing balance support: During functional activity, Reliant on assistive device for balance Standing balance-Leahy Scale: Zero Standing balance comment: MaxAx2        Pertinent Vitals/Pain Pain Assessment Pain Assessment: No/denies pain    Home Living Family/patient expects to be discharged to:: Private residence Living Arrangements: Alone Available Help at Discharge: Family;Available PRN/intermittently (sister checks intemittently) Type of Home: Apartment Home Access: Level entry       Home Layout: One level Home Equipment: Agricultural consultant (2 wheels);Rollator (4 wheels);Shower seat;Grab bars - tub/shower;Hand held shower head Additional Comments: _ [place] apartments    Prior Function Prior Level of Function : Independent/Modified Independent    Mobility Comments: ModI using RW in home, rollator in the community. Denies falls in the last 56mo. ADLs Comments: Indep with ADLs and modI for most IADLs. Relies on sister for transporation. Has been having difficulty getting in sister's SUV. TV dinners and sister helps with cleaning     Extremity/Trunk Assessment   Upper Extremity Assessment Upper Extremity Assessment: Defer to OT evaluation    Lower Extremity  Assessment Lower Extremity Assessment: RLE deficits/detail;LLE deficits/detail RLE Deficits / Details: increased tone with full PROM to knee  extension and limited knee flexion ROM to ~90 deg. Foot supinated with inability to keep flat on the ground LLE Deficits / Details: increased tone with full PROM to knee extension and limited knee flexion ROM (~60 deg). Foot supinated with inability to keep flat on the ground    Cervical / Trunk Assessment Cervical / Trunk Assessment: Kyphotic  Communication   Communication Communication: Impaired Factors Affecting Communication: Hearing impaired    Cognition Arousal: Alert Behavior During Therapy: WFL for tasks assessed/performed   PT - Cognitive impairments: No apparent impairments      Following commands: Intact       Cueing Cueing Techniques: Verbal cues     General Comments General comments (skin integrity, edema, etc.): VSS     PT Assessment Patient needs continued PT services  PT Problem List Impaired tone;Decreased strength;Decreased range of motion;Decreased activity tolerance;Decreased balance;Decreased mobility       PT Treatment Interventions DME instruction;Gait training;Functional mobility training;Therapeutic exercise;Therapeutic activities;Balance training;Neuromuscular re-education;Patient/family education    PT Goals (Current goals can be found in the Care Plan section)  Acute Rehab PT Goals Patient Stated Goal: to be able to walk PT Goal Formulation: With patient Time For Goal Achievement: 08/07/24 Potential to Achieve Goals: Good    Frequency Min 2X/week     Co-evaluation   Reason for Co-Treatment: Complexity of the patient's impairments (multi-system involvement);Necessary to address cognition/behavior during functional activity;To address functional/ADL transfers PT goals addressed during session: Mobility/safety with mobility;Balance OT goals addressed during session: ADL's and self-care;Strengthening/ROM       AM-PAC PT 6 Clicks Mobility  Outcome Measure Help needed turning from your back to your side while in a flat bed without using  bedrails?: Total Help needed moving from lying on your back to sitting on the side of a flat bed without using bedrails?: Total Help needed moving to and from a bed to a chair (including a wheelchair)?: Total Help needed standing up from a chair using your arms (e.g., wheelchair or bedside chair)?: Total Help needed to walk in hospital room?: Total Help needed climbing 3-5 steps with a railing? : Total 6 Click Score: 6    End of Session Equipment Utilized During Treatment: Gait belt Activity Tolerance: Patient tolerated treatment well Patient left: in bed;with call bell/phone within reach;with bed alarm set;with nursing/sitter in room Nurse Communication: Mobility status;Need for lift equipment PT Visit Diagnosis: Unsteadiness on feet (R26.81);Other abnormalities of gait and mobility (R26.89);Muscle weakness (generalized) (M62.81)    Time: 9047-8984 PT Time Calculation (min) (ACUTE ONLY): 23 min   Charges:   PT Evaluation $PT Eval Moderate Complexity: 1 Mod   PT General Charges $$ ACUTE PT VISIT: 1 Visit       Kate ORN, PT, DPT Secure Chat Preferred  Rehab Office [phone]   Kate BRAVO Wendolyn 07/24/2024, 11:48 AM

## 2024-07-24 NOTE — Evaluation (Signed)
 Occupational Therapy Evaluation Patient Details Name: Kathy Owens MRN: [id number] DOB: [birth year] Today's Date: 07/24/2024   History of Present Illness   Pt is a 68 y/o F presenting to ED on 10/4 with incr spasticity x2 weeks. PMH includes cerebral palsy, herpes zoster on valtrex, HTN, anterior ischemic bilateral optic neuropathy, vertigo, HLD, osteoporosis     Clinical Impressions Pt reports living alone in an apartment, her sister helps with IADLs, ind with ADLs and uses RW for mobility. Pt currently needs min-max A for ADLs, Mod-max +2 for bed mobility and max +2 for standing attempts with 2 person HHA. Pt minimally able to clear hips from bed and with LLE > RLE spasticity. Pt presenting with impairments listed below, will follow acutely. Patient will benefit from continued inpatient follow up therapy, <3 hours/day to maximize safety/ind with ADL/functional mobility.      If plan is discharge home, recommend the following:   Two people to help with walking and/or transfers;A lot of help with bathing/dressing/bathroom;Assistance with cooking/housework;Assistance with feeding;Direct supervision/assist for medications management;Direct supervision/assist for financial management;Assist for transportation;Help with stairs or ramp for entrance     Functional Status Assessment   Patient has had a recent decline in their functional status and demonstrates the ability to make significant improvements in function in a reasonable and predictable amount of time.     Equipment Recommendations   Other (comment) (defer)     Recommendations for Other Services   PT consult     Precautions/Restrictions   Precautions Precautions: Fall Precaution/Restrictions Comments: hx of CP with spasticity Restrictions Weight Bearing Restrictions Per Provider Order: No     Mobility Bed Mobility Overal bed mobility: Needs Assistance Bed Mobility: Supine to Sit, Sit to Supine     Supine  to sit: Mod assist, +2 for physical assistance Sit to supine: Max assist, +2 for physical assistance   General bed mobility comments: assist with bed pad to bring pt EOB on R side    Transfers Overall transfer level: Needs assistance Equipment used: 2 person hand held assist Transfers: Sit to/from Stand Sit to Stand: Max assist, +2 physical assistance           General transfer comment: max +2, unable to achieve full standing due to incr rigidity in L >R LE      Balance Overall balance assessment: Needs assistance Sitting-balance support: Feet supported Sitting balance-Leahy Scale: Poor Sitting balance - Comments: cannot reach outside BOS without LOB, mod A posterior support   Standing balance support: During functional activity, Reliant on assistive device for balance Standing balance-Leahy Scale: Poor Standing balance comment: heavy reliance on external assist                           ADL either performed or assessed with clinical judgement   ADL Overall ADL's : Needs assistance/impaired Eating/Feeding: Minimal assistance   Grooming: Minimal assistance   Upper Body Bathing: Moderate assistance;Bed level;Sitting   Lower Body Bathing: Maximal assistance;Sitting/lateral leans;Bed level   Upper Body Dressing : Moderate assistance;Bed level;Sitting   Lower Body Dressing: Maximal assistance;Bed level;Sitting/lateral leans   Toilet Transfer: Maximal assistance;+2 for physical assistance   Toileting- Clothing Manipulation and Hygiene: Maximal assistance       Functional mobility during ADLs: Maximal assistance;+2 for physical assistance       Vision   Additional Comments: noted redness to L eye, hx anterior ischemic bilateral optic neuropathy     Perception Perception: Not  tested       Praxis Praxis: Not tested       Pertinent Vitals/Pain Pain Assessment Pain Assessment: No/denies pain     Extremity/Trunk Assessment Upper Extremity  Assessment Upper Extremity Assessment: Generalized weakness (hx CP)   Lower Extremity Assessment Lower Extremity Assessment: Defer to PT evaluation   Cervical / Trunk Assessment Cervical / Trunk Assessment: Kyphotic   Communication Communication Communication: Impaired Factors Affecting Communication: Hearing impaired   Cognition Arousal: Alert Behavior During Therapy: WFL for tasks assessed/performed Cognition: No family/caregiver present to determine baseline                               Following commands: Intact       Cueing  General Comments   Cueing Techniques: Verbal cues  VSS   Exercises     Shoulder Instructions      Home Living Family/patient expects to be discharged to:: Private residence Living Arrangements: Alone Available Help at Discharge: Family;Available PRN/intermittently (sister checks intemittently) Type of Home: Apartment Home Access: Level entry     Home Layout: One level     Bathroom Shower/Tub: Producer, television/film/video: Handicapped height Bathroom Accessibility: Yes   Home Equipment: Agricultural consultant (2 wheels);Rollator (4 wheels);Shower seat;Grab bars - tub/shower;Hand held shower head   Additional Comments: _ [place] [address]      Prior Functioning/Environment Prior Level of Function : Independent/Modified Independent             Mobility Comments: ModI using RW in home, rollator in the community. Denies falls in the last 56mo. ADLs Comments: Indep with ADLs and modI for most IADLs. Relies on sister for transporation. Has been having difficulty getting in sister's SUV. TV dinners and sister helps with cleaning    OT Problem List: Decreased strength;Decreased range of motion;Decreased activity tolerance;Impaired balance (sitting and/or standing);Decreased cognition;Decreased safety awareness;Cardiopulmonary status limiting activity   OT Treatment/Interventions: Self-care/ADL training;Therapeutic  exercise;Energy conservation;DME and/or AE instruction;Therapeutic activities;Patient/family education;Balance training      OT Goals(Current goals can be found in the care plan section)   Acute Rehab OT Goals Patient Stated Goal: did not state OT Goal Formulation: With patient Time For Goal Achievement: 08/07/24 Potential to Achieve Goals: Good ADL Goals Pt Will Perform Grooming: with contact guard assist;sitting Pt Will Perform Upper Body Dressing: with contact guard assist;sitting Pt Will Perform Lower Body Dressing: sit to/from stand;sitting/lateral leans;with min assist Pt Will Transfer to Toilet: with min assist;bedside commode;squat pivot transfer;stand pivot transfer Additional ADL Goal #1: pt will demo good sitting balance in prep for seated ADLs   OT Frequency:  Min 2X/week    Co-evaluation PT/OT/SLP Co-Evaluation/Treatment: Yes Reason for Co-Treatment: Complexity of the patient's impairments (multi-system involvement);Necessary to address cognition/behavior during functional activity;To address functional/ADL transfers   OT goals addressed during session: ADL's and self-care;Strengthening/ROM      AM-PAC OT 6 Clicks Daily Activity     Outcome Measure Help from another person eating meals?: A Little Help from another person taking care of personal grooming?: A Little Help from another person toileting, which includes using toliet, bedpan, or urinal?: A Lot Help from another person bathing (including washing, rinsing, drying)?: A Lot Help from another person to put on and taking off regular upper body clothing?: A Lot Help from another person to put on and taking off regular lower body clothing?: A Lot 6 Click Score: 14   End of Session Equipment Utilized  During Treatment: Gait belt Nurse Communication: Mobility status  Activity Tolerance: Patient tolerated treatment well Patient left: in bed;with call bell/phone within reach;with bed alarm set;with nursing/sitter  in room  OT Visit Diagnosis: Unsteadiness on feet (R26.81);Other abnormalities of gait and mobility (R26.89);Muscle weakness (generalized) (M62.81)                Time: 9048-8985 OT Time Calculation (min): 23 min Charges:  OT General Charges $OT Visit: 1 Visit OT Evaluation $OT Eval Moderate Complexity: 1 Mod  Danyon Mcginness K, OTD, OTR/L SecureChat Preferred Acute Rehab [phone]   Darely Becknell K Koonce 07/24/2024, 10:33 AM

## 2024-07-25 DIAGNOSIS — B029 Zoster without complications: Secondary | ICD-10-CM | POA: Diagnosis not present

## 2024-07-25 DIAGNOSIS — M62838 Other muscle spasm: Secondary | ICD-10-CM

## 2024-07-25 LAB — BASIC METABOLIC PANEL WITH GFR
Anion gap: 11 (ref 5–15)
BUN: 38 mg/dL — ABNORMAL HIGH (ref 8–23)
CO2: 20 mmol/L — ABNORMAL LOW (ref 22–32)
Calcium: 8.8 mg/dL — ABNORMAL LOW (ref 8.9–10.3)
Chloride: 103 mmol/L (ref 98–111)
Creatinine, Ser: 1.16 mg/dL — ABNORMAL HIGH (ref 0.44–1.00)
GFR, Estimated: 51 mL/min — ABNORMAL LOW (ref >60)
Glucose, Bld: 104 mg/dL — ABNORMAL HIGH (ref 70–99)
Potassium: 3.5 mmol/L (ref 3.5–5.1)
Sodium: 134 mmol/L — ABNORMAL LOW (ref 135–145)

## 2024-07-25 LAB — MAGNESIUM: Magnesium: 1.5 mg/dL — ABNORMAL LOW (ref 1.7–2.4)

## 2024-07-25 MED ORDER — MAGNESIUM SULFATE 4 GM/100ML IV SOLN
4.0000 g | Freq: Once | INTRAVENOUS | Status: AC
  Administered 2024-07-25: 4 g via INTRAVENOUS
  Filled 2024-07-25: qty 100

## 2024-07-25 NOTE — Plan of Care (Signed)
 Noted that patient's eye is getting more red with some clear drainage and eyelid swelling. Vision at baseline per pt.   Problem: Education: Goal: Knowledge of General Education information will improve Description: Including pain rating scale, medication(s)/side effects and non-pharmacologic comfort measures Outcome: Progressing   Problem: Health Behavior/Discharge Planning: Goal: Ability to manage health-related needs will improve Outcome: Progressing   Problem: Clinical Measurements: Goal: Ability to maintain clinical measurements within normal limits will improve Outcome: Progressing Goal: Will remain free from infection Outcome: Progressing Goal: Diagnostic test results will improve Outcome: Progressing Goal: Respiratory complications will improve Outcome: Progressing Goal: Cardiovascular complication will be avoided Outcome: Progressing   Problem: Activity: Goal: Risk for activity intolerance will decrease Outcome: Progressing   Problem: Nutrition: Goal: Adequate nutrition will be maintained Outcome: Progressing   Problem: Coping: Goal: Level of anxiety will decrease Outcome: Progressing   Problem: Elimination: Goal: Will not experience complications related to bowel motility Outcome: Progressing Goal: Will not experience complications related to urinary retention Outcome: Progressing   Problem: Pain Managment: Goal: General experience of comfort will improve and/or be controlled Outcome: Progressing   Problem: Safety: Goal: Ability to remain free from injury will improve Outcome: Progressing   Problem: Skin Integrity: Goal: Risk for impaired skin integrity will decrease Outcome: Progressing

## 2024-07-25 NOTE — Progress Notes (Addendum)
 HD#0 SUBJECTIVE:  Patient Summary: Kathy Owens is a 68 y.o. with a pertinent PMH of cerebral palsy and active herpes zoster being treated with Valtrex who presented with concerns of increased spasticity and admitted for observation.   Overnight Events: None  Interim History: Patient is evaluated at bedside. Resting comfortably in bed eating breakfast. She asks about her electrolytes and how they are doing this morning.  Denies HA, muscle pain, chest/abdominal pain, or SOB. She does note that she would like to improve her strength before discharging, and is amenable to CIR vs SNF.   OBJECTIVE:  Vital Signs: Vitals:   07/24/24 2020 07/25/24 0416 07/25/24 0754 07/25/24 1134  BP: 131/68 124/71 124/62 (!) 126/58  Pulse: 89 89 73 73  Resp: 20 (!) 22 18 18   Temp: 98.6 F (37 C) 98.7 F (37.1 C) 98.1 F (36.7 C) 98.1 F (36.7 C)  TempSrc: Oral Oral Oral Oral  SpO2: 96% 96% 99% 99%  Weight:      Height:       Supplemental O2: Room Air SpO2: 99 % O2 Flow Rate (L/min): 0 L/min  Filed Weights   07/24/24 0315  Weight: 67.3 kg     Intake/Output Summary (Last 24 hours) at 07/25/2024 1210 Last data filed at 07/25/2024 0417 Gross per 24 hour  Intake 720 ml  Output 750 ml  Net -30 ml   Net IO Since Admission: 335 mL [07/25/24 1210]  Physical Exam: Physical Exam Constitutional:      Appearance: Normal appearance.  HENT:     Head: Normocephalic and atraumatic.     Comments: dried crusted lesions in left V1 distribution above eyebrow/below eyelid without serous drainage. No ocular involvement.    Nose: Nose normal.     Mouth/Throat:     Mouth: Mucous membranes are moist.  Cardiovascular:     Rate and Rhythm: Normal rate and regular rhythm.     Pulses: Normal pulses.     Heart sounds: Normal heart sounds.  Pulmonary:     Effort: Pulmonary effort is normal.     Breath sounds: Normal breath sounds.  Abdominal:     General: Abdomen is flat. Bowel sounds are normal.      Palpations: Abdomen is soft.  Skin:    General: Skin is warm and dry.  Neurological:     General: No focal deficit present.     Mental Status: She is alert and oriented to person, place, and time.  Psychiatric:        Mood and Affect: Mood normal.        Behavior: Behavior normal.    Patient Lines/Drains/Airways Status     Active Line/Drains/Airways     Name Placement date Placement time Site Days   Peripheral IV 07/23/24 20 G 1 Right Antecubital 07/23/24  2320  Antecubital  1            Pertinent labs and imaging:      Latest Ref Rng & Units 07/24/2024    4:27 AM 07/23/2024   11:21 PM 07/13/2024    8:16 PM  CBC  WBC 4.0 - 10.5 K/uL 9.2  8.6  4.6   Hemoglobin 12.0 - 15.0 g/dL 86.1  86.2  86.3   Hematocrit 36.0 - 46.0 % 40.9  41.0  40.9   Platelets 150 - 400 K/uL 249  309  214        Latest Ref Rng & Units 07/25/2024    4:38 AM 07/24/2024  11:00 AM 07/24/2024    4:27 AM  CMP  Glucose 70 - 99 mg/dL 895  895  887   BUN 8 - 23 mg/dL 38  22  22   Creatinine 0.44 - 1.00 mg/dL 8.83  9.08  8.98   Sodium 135 - 145 mmol/L 134  133  135   Potassium 3.5 - 5.1 mmol/L 3.5  3.4  3.1   Chloride 98 - 111 mmol/L 103  98  98   CO2 22 - 32 mmol/L 20  22  21    Calcium  8.9 - 10.3 mg/dL 8.8  9.1  9.5     No results found.   ASSESSMENT/PLAN:  Assessment: Principal Problem:   Muscle spasticity Active Problems:   Cerebral palsy (HCC)   Chronic pain due to cerebral palsy   Chronic diarrhea of unknown origin   Impaired mobility   History of progressive weakness   Zoster, V1 distribution without ocular involvement  Kathy Owens is a 68 y.o. with a pertinent PMH of cerebral palsy and active herpes zoster being treated with Valtrex who presented with concerns of increased spasticity and admitted for observation.    Plan: #Generalized Muscle Spasticity  #Cerebral spasticity  Patient reporting increased stiffness causing weakness since starting Dantrolene 50mg . Will stop  Dantrolene.  Previously on Flexeril  alone to help with spasms, however has a history of intolerance to Baclofen  and Tizanidine  with side effects of worsened weakness. Prior to starting Dantrolene, patient was mobile with the aid of a rollator walker. Recently was at [facility] rehab in 01/2024 to improve strength and spasticity. Sees Dr. Lovorn with PM&R. Etiology for weakness likely multifactorial, including new medications (Dantrolene, Valtrex), recent infection with HSV exacerbating CNS, progression of disease, metabolic derangement (low magnesium, potassium), or aging. Will replenish electrolytes as appropriate. OT recommending CIR, but PT recommending SNF. Will consult TOC SW for coordination of care for patient.  - Continue Flexeril  for spasms but hold other antispasmodics.  - Continue to monitor BMP, Mag - PT/OT recommending acute rehab facility vs SNF - Pending TOC SW - Patient needs in-person follow up with PM&R to assess for weakness   #Impaired Mobility #History of Progressive weakness  Seems that patient has not been able to make multiple visits to clinic in person due to weakness and mobility issues with contractures starting beginning of this year being noted. Worsened when patient was on increasing doses of baclofen .  Patient states that taking dantrolene also worsened weakness. Could be that medications are contributing to weakness, but also could be that this is just progressive weakness from contractures that have caused deconditioning.    #Zoster, V1 distribution without ocular involvement  Diagnosed 9/27 and was started on Valtrex 1000mg  BID for renal dosing. Completed 7/14 days. Ophthalmology saw patient in ED on 9/27 and ruled out any ocular involvement but did note elevated intraocular pressure that will need to be followed up outpatient. On physical exam, lesions are crusted over in V1 distribution on left side of face without serous drainage.  - Continue valacyclovir 1000 mg  BID   #Hypokalemia #Hypomagnesemia  Found POA with potassium of 3.3 and magnesium of 1.6. Replacing as appropriate. Gave 4g IV Magnesium sulfate. Patient has history of chronic diarrhea treated with imodium  which could be contributing. Will continue to monitor.  - BMP, Mag  - Continue loperamide  2mg  daily   #Chronic pain due to cerebral palsy  On home hydrocodone  - acetaminophen  5-235 mg tablet. Will continue inpatient.   #hyperlipidemia  Continue Lipitor 10 mg.  Best Practice: Diet: Regular diet IVF: Fluids: none VTE: enoxaparin  (LOVENOX ) injection 40 mg Start: 07/24/24 1000 SCDs Start: 07/24/24 0200 Code: DNR/DNI  Disposition planning: Therapy Recs: SNF Family Contact: sister Tiney) DISPO: Anticipated discharge in 1-2 days to Rehab pending Insurance for SNF coverage.  Signature:  Timon Geissinger, DO [facility]  12:10 PM, 07/25/2024  On Call pager [phone]

## 2024-07-26 ENCOUNTER — Inpatient Hospital Stay (HOSPITAL_COMMUNITY)

## 2024-07-26 DIAGNOSIS — Z8249 Family history of ischemic heart disease and other diseases of the circulatory system: Secondary | ICD-10-CM | POA: Diagnosis not present

## 2024-07-26 DIAGNOSIS — Z66 Do not resuscitate: Secondary | ICD-10-CM | POA: Diagnosis present

## 2024-07-26 DIAGNOSIS — M4802 Spinal stenosis, cervical region: Secondary | ICD-10-CM | POA: Diagnosis not present

## 2024-07-26 DIAGNOSIS — G992 Myelopathy in diseases classified elsewhere: Secondary | ICD-10-CM | POA: Diagnosis present

## 2024-07-26 DIAGNOSIS — K529 Noninfective gastroenteritis and colitis, unspecified: Secondary | ICD-10-CM | POA: Diagnosis present

## 2024-07-26 DIAGNOSIS — G8929 Other chronic pain: Secondary | ICD-10-CM | POA: Diagnosis not present

## 2024-07-26 DIAGNOSIS — Z79899 Other long term (current) drug therapy: Secondary | ICD-10-CM | POA: Diagnosis not present

## 2024-07-26 DIAGNOSIS — R5381 Other malaise: Secondary | ICD-10-CM | POA: Diagnosis not present

## 2024-07-26 DIAGNOSIS — M2351 Chronic instability of knee, right knee: Secondary | ICD-10-CM | POA: Diagnosis present

## 2024-07-26 DIAGNOSIS — E669 Obesity, unspecified: Secondary | ICD-10-CM | POA: Diagnosis present

## 2024-07-26 DIAGNOSIS — I872 Venous insufficiency (chronic) (peripheral): Secondary | ICD-10-CM | POA: Diagnosis present

## 2024-07-26 DIAGNOSIS — Z23 Encounter for immunization: Secondary | ICD-10-CM | POA: Diagnosis not present

## 2024-07-26 DIAGNOSIS — M47816 Spondylosis without myelopathy or radiculopathy, lumbar region: Secondary | ICD-10-CM | POA: Diagnosis not present

## 2024-07-26 DIAGNOSIS — G809 Cerebral palsy, unspecified: Secondary | ICD-10-CM | POA: Diagnosis not present

## 2024-07-26 DIAGNOSIS — Z7983 Long term (current) use of bisphosphonates: Secondary | ICD-10-CM | POA: Diagnosis not present

## 2024-07-26 DIAGNOSIS — G894 Chronic pain syndrome: Secondary | ICD-10-CM | POA: Diagnosis present

## 2024-07-26 DIAGNOSIS — I951 Orthostatic hypotension: Secondary | ICD-10-CM | POA: Diagnosis present

## 2024-07-26 DIAGNOSIS — B029 Zoster without complications: Secondary | ICD-10-CM | POA: Diagnosis not present

## 2024-07-26 DIAGNOSIS — G800 Spastic quadriplegic cerebral palsy: Secondary | ICD-10-CM | POA: Diagnosis present

## 2024-07-26 DIAGNOSIS — N183 Chronic kidney disease, stage 3 unspecified: Secondary | ICD-10-CM | POA: Diagnosis present

## 2024-07-26 DIAGNOSIS — Z79891 Long term (current) use of opiate analgesic: Secondary | ICD-10-CM | POA: Diagnosis not present

## 2024-07-26 DIAGNOSIS — Z8619 Personal history of other infectious and parasitic diseases: Secondary | ICD-10-CM | POA: Diagnosis not present

## 2024-07-26 DIAGNOSIS — M810 Age-related osteoporosis without current pathological fracture: Secondary | ICD-10-CM | POA: Diagnosis present

## 2024-07-26 DIAGNOSIS — Z1152 Encounter for screening for COVID-19: Secondary | ICD-10-CM | POA: Diagnosis not present

## 2024-07-26 DIAGNOSIS — F54 Psychological and behavioral factors associated with disorders or diseases classified elsewhere: Secondary | ICD-10-CM | POA: Diagnosis not present

## 2024-07-26 DIAGNOSIS — H409 Unspecified glaucoma: Secondary | ICD-10-CM | POA: Diagnosis present

## 2024-07-26 DIAGNOSIS — H5789 Other specified disorders of eye and adnexa: Secondary | ICD-10-CM | POA: Diagnosis present

## 2024-07-26 DIAGNOSIS — D649 Anemia, unspecified: Secondary | ICD-10-CM | POA: Diagnosis not present

## 2024-07-26 DIAGNOSIS — H47013 Ischemic optic neuropathy, bilateral: Secondary | ICD-10-CM | POA: Diagnosis present

## 2024-07-26 DIAGNOSIS — E785 Hyperlipidemia, unspecified: Secondary | ICD-10-CM | POA: Diagnosis present

## 2024-07-26 DIAGNOSIS — R8290 Unspecified abnormal findings in urine: Secondary | ICD-10-CM | POA: Diagnosis present

## 2024-07-26 DIAGNOSIS — R29818 Other symptoms and signs involving the nervous system: Secondary | ICD-10-CM | POA: Diagnosis not present

## 2024-07-26 DIAGNOSIS — E1122 Type 2 diabetes mellitus with diabetic chronic kidney disease: Secondary | ICD-10-CM | POA: Diagnosis present

## 2024-07-26 DIAGNOSIS — M51369 Other intervertebral disc degeneration, lumbar region without mention of lumbar back pain or lower extremity pain: Secondary | ICD-10-CM | POA: Diagnosis not present

## 2024-07-26 DIAGNOSIS — I129 Hypertensive chronic kidney disease with stage 1 through stage 4 chronic kidney disease, or unspecified chronic kidney disease: Secondary | ICD-10-CM | POA: Diagnosis present

## 2024-07-26 DIAGNOSIS — Z6830 Body mass index (BMI) 30.0-30.9, adult: Secondary | ICD-10-CM | POA: Diagnosis not present

## 2024-07-26 DIAGNOSIS — G808 Other cerebral palsy: Secondary | ICD-10-CM | POA: Diagnosis not present

## 2024-07-26 DIAGNOSIS — E876 Hypokalemia: Secondary | ICD-10-CM | POA: Diagnosis present

## 2024-07-26 DIAGNOSIS — R7989 Other specified abnormal findings of blood chemistry: Secondary | ICD-10-CM | POA: Diagnosis not present

## 2024-07-26 DIAGNOSIS — M48061 Spinal stenosis, lumbar region without neurogenic claudication: Secondary | ICD-10-CM | POA: Diagnosis not present

## 2024-07-26 DIAGNOSIS — M5126 Other intervertebral disc displacement, lumbar region: Secondary | ICD-10-CM | POA: Diagnosis not present

## 2024-07-26 DIAGNOSIS — G959 Disease of spinal cord, unspecified: Secondary | ICD-10-CM | POA: Diagnosis not present

## 2024-07-26 DIAGNOSIS — R269 Unspecified abnormalities of gait and mobility: Secondary | ICD-10-CM | POA: Diagnosis present

## 2024-07-26 DIAGNOSIS — M62838 Other muscle spasm: Secondary | ICD-10-CM | POA: Diagnosis not present

## 2024-07-26 DIAGNOSIS — M4804 Spinal stenosis, thoracic region: Secondary | ICD-10-CM | POA: Diagnosis present

## 2024-07-26 DIAGNOSIS — T375X5A Adverse effect of antiviral drugs, initial encounter: Secondary | ICD-10-CM | POA: Diagnosis present

## 2024-07-26 DIAGNOSIS — T428X5A Adverse effect of antiparkinsonism drugs and other central muscle-tone depressants, initial encounter: Secondary | ICD-10-CM | POA: Diagnosis present

## 2024-07-26 DIAGNOSIS — G319 Degenerative disease of nervous system, unspecified: Secondary | ICD-10-CM | POA: Diagnosis not present

## 2024-07-26 DIAGNOSIS — R531 Weakness: Secondary | ICD-10-CM

## 2024-07-26 DIAGNOSIS — Z7409 Other reduced mobility: Secondary | ICD-10-CM | POA: Diagnosis present

## 2024-07-26 LAB — BASIC METABOLIC PANEL WITH GFR
Anion gap: 12 (ref 5–15)
BUN: 34 mg/dL — ABNORMAL HIGH (ref 8–23)
CO2: 20 mmol/L — ABNORMAL LOW (ref 22–32)
Calcium: 8.4 mg/dL — ABNORMAL LOW (ref 8.9–10.3)
Chloride: 102 mmol/L (ref 98–111)
Creatinine, Ser: 0.91 mg/dL (ref 0.44–1.00)
GFR, Estimated: >60 mL/min (ref >60)
Glucose, Bld: 97 mg/dL (ref 70–99)
Potassium: 3.5 mmol/L (ref 3.5–5.1)
Sodium: 134 mmol/L — ABNORMAL LOW (ref 135–145)

## 2024-07-26 LAB — MAGNESIUM: Magnesium: 2.1 mg/dL (ref 1.7–2.4)

## 2024-07-26 MED ORDER — HYDROCERIN EX CREA
TOPICAL_CREAM | Freq: Two times a day (BID) | CUTANEOUS | Status: DC
  Filled 2024-07-26: qty 113

## 2024-07-26 MED ORDER — GADOBUTROL 1 MMOL/ML IV SOLN
7.0000 mL | Freq: Once | INTRAVENOUS | Status: AC | PRN
  Administered 2024-07-26: 7 mL via INTRAVENOUS

## 2024-07-26 MED ORDER — NAPHAZOLINE-GLYCERIN 0.012-0.25 % OP SOLN
1.0000 [drp] | Freq: Four times a day (QID) | OPHTHALMIC | Status: DC | PRN
Start: 2024-07-26 — End: 2024-08-01
  Administered 2024-07-26 (×2): 2 [drp] via OPHTHALMIC
  Administered 2024-07-28 – 2024-07-30 (×2): 1 [drp] via OPHTHALMIC
  Administered 2024-07-31 (×2): 2 [drp] via OPHTHALMIC
  Filled 2024-07-26: qty 15

## 2024-07-26 MED ORDER — ONDANSETRON 4 MG PO TBDP
4.0000 mg | ORAL_TABLET | Freq: Once | ORAL | Status: AC
  Administered 2024-07-26: 4 mg via ORAL
  Filled 2024-07-26: qty 1

## 2024-07-26 NOTE — Progress Notes (Signed)
   Inpatient Rehabilitation Admissions Coordinator   I will place order to request Dr Cornelio to consult to assist with management of her weakness and spasticity per Dr Loa request.  Heron Leavell, RN, MSN Rehab Admissions Coordinator [phone] 07/26/2024 8:05 AM

## 2024-07-26 NOTE — Plan of Care (Signed)

## 2024-07-26 NOTE — Progress Notes (Signed)
 Transition of Care [facility]) - Inpatient Brief Assessment   Patient Details  Name: Kathy Owens MRN: [id number] Date of Birth: 1956-08-03  Transition of Care [facility]) CM/SW Contact:    Rosaline JONELLE Joe, RN Phone Number: 07/26/2024, 9:50 AM   Clinical Narrative: Patient admitted for generalized muscle spasticity.  Patient seen by Pt/Ot with rehabilitation consult recommended - pending at this time.  DME at the home includes shower seat, RW, rolator.  IP Care management team will continue to follow the patient for rehabilitation needs - preferably CIR.   Transition of Care Asessment: Insurance and Status: (P) Insurance coverage has been reviewed Patient has primary care physician: (P) Yes Home environment has been reviewed: (P) from [employer] senior apartments Prior level of function:: (P) self Prior/Current Home Services: (P) No current home services Social Drivers of Health Review: (P) SDOH reviewed needs interventions Readmission risk has been reviewed: (P) Yes Transition of care needs: (P) transition of care needs identified, TOC will continue to follow

## 2024-07-26 NOTE — Progress Notes (Signed)
 Physical Therapy Treatment Patient Details Name: Kathy Owens MRN: [id number] DOB: February 08, 1956 Today's Date: 07/26/2024   History of Present Illness 68 y/o F presenting to ED on 10/4 with incr spasticity x2 weeks. PMH includes cerebral palsy, herpes zoster on valtrex, HTN, anterior ischemic bilateral optic neuropathy, vertigo, HLD, osteoporosis    PT Comments  Pt is pleasant and eager to work with therapy today. Pt reports that prior to hospitalization she was walking with a RW, but that has become increasingly more difficult. Pt with increased hip adduction, knee extension and foot supination . Pt is needing maxAx2 today for mobilization to chair, with particular assist to weight shifting. Once up in chair pt is able to separate motion of R LE from L LE but not L LE motion from RLE in knee flex/extend. Patient will benefit from intensive inpatient follow-up therapy, >3 hours/day prior to return home to be able to continue to care for herself.     If plan is discharge home, recommend the following: A lot of help with walking and/or transfers;A lot of help with bathing/dressing/bathroom;Assistance with cooking/housework;Assist for transportation;Help with stairs or ramp for entrance   Can travel by private vehicle     No  Equipment Recommendations  Other (comment) (TBA with progress)       Precautions / Restrictions Precautions Precautions: Fall Precaution/Restrictions Comments: hx of CP with spasticity Restrictions Weight Bearing Restrictions Per Provider Order: No     Mobility  Bed Mobility Overal bed mobility: Needs Assistance Bed Mobility: Rolling, Supine to Sit Rolling: +2 for physical assistance, Total assist   Supine to sit: +2 for physical assistance, Total assist     General bed mobility comments: pt with BLE adducted and unable to preform segmental movement. Pt reaching with trunk for bed rail in attempt to initiate rolling in teh bed    Transfers Overall transfer  level: Needs assistance Equipment used: Rolling walker (2 wheels) Transfers: Sit to/from Stand, Bed to chair/wheelchair/BSC Sit to Stand: Max assist, +2 physical assistance Stand pivot transfers: +2 physical assistance, Max assist         General transfer comment: pt is able to static stand in RW with total +2 min (A) with cues to shift toward L side. pt with posterior bias with balance. pt with wheels on RW reversed to have wheels on the outside the frame to accommodate foot positioning in standing    Ambulation/Gait Ambulation/Gait assistance: Max assist, +2 physical assistance, Total assist Gait Distance (Feet): 2 Feet (+1) Assistive device: Rolling walker (2 wheels) Gait Pattern/deviations: Step-to pattern, Decreased step length - right, Decreased step length - left, Decreased weight shift to right, Decreased weight shift to left, Shuffle, Knee flexed in stance - right, Knee flexed in stance - left Gait velocity: slowed Gait velocity interpretation: <1.31 ft/sec, indicative of household ambulator   General Gait Details: pt requiring maximal cuing and assistance with weight shifting to be able to advance contralateral LE, requires increased assist to lean to her L. backward stepping requires totalAx2 and ultimately pt needs lift back into chair with posterior LoB         Balance Overall balance assessment: Needs assistance Sitting-balance support: Bilateral upper extremity supported, Feet supported Sitting balance-Leahy Scale: Poor Sitting balance - Comments: pt needs cues to anterior weight shift and bil feet raising off the floor with tone. pt with proprioception input to help with some awareness   Standing balance support: Bilateral upper extremity supported, During functional activity, Reliant on assistive device  for balance Standing balance-Leahy Scale: Zero                              Communication Communication Communication: Impaired Factors Affecting  Communication: Hearing impaired  Cognition Arousal: Alert Behavior During Therapy: WFL for tasks assessed/performed                             Following commands: Intact      Cueing Cueing Techniques: Verbal cues, Tactile cues     General Comments General comments (skin integrity, edema, etc.): VSS on RA      Pertinent Vitals/Pain Pain Assessment Pain Assessment: No/denies pain     PT Goals (current goals can now be found in the care plan section) Acute Rehab PT Goals PT Goal Formulation: With patient Time For Goal Achievement: 08/07/24 Potential to Achieve Goals: Good    Frequency    Min 2X/week           Co-evaluation PT/OT/SLP Co-Evaluation/Treatment: Yes Reason for Co-Treatment: Complexity of the patient's impairments (multi-system involvement);Necessary to address cognition/behavior during functional activity;To address functional/ADL transfers   OT goals addressed during session: ADL's and self-care;Strengthening/ROM      AM-PAC PT 6 Clicks Mobility   Outcome Measure  Help needed turning from your back to your side while in a flat bed without using bedrails?: Total Help needed moving from lying on your back to sitting on the side of a flat bed without using bedrails?: Total Help needed moving to and from a bed to a chair (including a wheelchair)?: Total Help needed standing up from a chair using your arms (e.g., wheelchair or bedside chair)?: Total Help needed to walk in hospital room?: Total Help needed climbing 3-5 steps with a railing? : Total 6 Click Score: 6    End of Session Equipment Utilized During Treatment: Gait belt Activity Tolerance: Patient tolerated treatment well Patient left: in bed;with call bell/phone within reach;with bed alarm set;with nursing/sitter in room Nurse Communication: Mobility status;Need for lift equipment PT Visit Diagnosis: Unsteadiness on feet (R26.81);Other abnormalities of gait and mobility  (R26.89);Muscle weakness (generalized) (M62.81)     Time: 9054-8989 PT Time Calculation (min) (ACUTE ONLY): 25 min  Charges:    $Therapeutic Activity: 8-22 mins PT General Charges $$ ACUTE PT VISIT: 1 Visit                     Kathy Owens PT, DPT Acute Rehabilitation Services Please use secure chat or  Call Office [phone]    Kathy Owens 07/26/2024, 4:00 PM

## 2024-07-26 NOTE — Progress Notes (Signed)
   Inpatient Rehabilitation Admissions Coordinator   Discussed case with Dr Lovorn. Her consult is pending. I await further medical workup as recommended. I will follow up tomorrow.  Heron Leavell, RN, MSN Rehab Admissions Coordinator [phone] 07/26/2024 6:17 PM

## 2024-07-26 NOTE — Treatment Plan (Addendum)
 Occupational Therapy Treatment Patient Details Name: Kathy Owens MRN: [id number] DOB: May 31, 1956 Today's Date: 07/26/2024   History of present illness 68 y/o F presenting to ED on 10/4 with incr spasticity x2 weeks. PMH includes cerebral palsy, herpes zoster on valtrex, HTN, anterior ischemic bilateral optic neuropathy, vertigo, HLD, osteoporosis   OT comments  Pt is very pleasant and joking with therapists this session. Pt with a strong posterior bias and required total +2 max (A) to power up from elevated bed surface. Pt at baseline is indep with adls and using a rollator in her home. Pt expressed concerns that her tone is limiting her ability to care for herself today. Recommendation for Patient will benefit from intensive inpatient follow-up therapy, >3 hours/day Call me [name] Dr Cornelio pending consult for spacticity noted in the chart. Pt at this time flexeril  5mg  premedicated prior to session.       If plan is discharge home, recommend the following:  Two people to help with walking and/or transfers;A lot of help with bathing/dressing/bathroom;Assistance with cooking/housework;Assistance with feeding;Direct supervision/assist for medications management;Direct supervision/assist for financial management;Assist for transportation;Help with stairs or ramp for entrance   Equipment Recommendations   (defer to next venue)    Recommendations for Other Services Rehab consult;PT consult    Precautions / Restrictions Precautions Precautions: Fall Precaution/Restrictions Comments: hx of CP with spasticity Restrictions Weight Bearing Restrictions Per Provider Order: No       Mobility Bed Mobility Overal bed mobility: Needs Assistance Bed Mobility: Rolling, Supine to Sit Rolling: +2 for physical assistance, Total assist   Supine to sit: +2 for physical assistance, Total assist     General bed mobility comments: pt with BLE adducted and unable to preform segmental movement. Pt  reaching with trunk for bed rail in attempt to initiate rolling in teh bed    Transfers Overall transfer level: Needs assistance Equipment used: Rolling walker (2 wheels) Transfers: Sit to/from Stand, Bed to chair/wheelchair/BSC Sit to Stand: Max assist, +2 physical assistance Stand pivot transfers: +2 physical assistance, Max assist         General transfer comment: pt is able to static stand in RW with total +2 min (A) with cues to shift toward L side. pt with posterior bias with balance. pt with wheels on RW reversed to have wheels on the outside the frame to accommodate foot positioning in standing     Balance Overall balance assessment: Needs assistance Sitting-balance support: Bilateral upper extremity supported, Feet supported Sitting balance-Leahy Scale: Poor Sitting balance - Comments: pt needs cues to anterior weight shift and bil feet raising off the floor with tone. pt with proprioception input to help with some awareness   Standing balance support: Bilateral upper extremity supported, During functional activity, Reliant on assistive device for balance Standing balance-Leahy Scale: Zero                             ADL either performed or assessed with clinical judgement   ADL Overall ADL's : Needs assistance/impaired Eating/Feeding: Set up;Bed level Eating/Feeding Details (indicate cue type and reason): noted to have spillage on the tray and bed linen. Grooming: Therapist, nutritional;Set up;Bed level           Upper Body Dressing : Minimal assistance;Bed level Upper Body Dressing Details (indicate cue type and reason): new gown don Lower Body Dressing: Maximal assistance   Toilet Transfer: +2 for physical assistance;Maximal assistance Toilet Transfer Details (indicate cue  type and reason): pt needs extensive help to power up Toileting- Clothing Manipulation and Hygiene: Maximal assistance         General ADL Comments: pt reports baseline can complete bed  mobility MOD I and needed extensive help to complete task. pt initiates but just unable to complete with tone present    Extremity/Trunk Assessment Upper Extremity Assessment Upper Extremity Assessment: Overall WFL for tasks assessed   Lower Extremity Assessment Lower Extremity Assessment: Defer to PT evaluation        Vision   Additional Comments: L eye redness and RN giving eye drops during session   Perception     Praxis     Communication Communication Communication: Impaired Factors Affecting Communication: Hearing impaired   Cognition Arousal: Alert Behavior During Therapy: WFL for tasks assessed/performed Cognition: No family/caregiver present to determine baseline             OT - Cognition Comments: WFL for session and demonstrates humor. Pt able to give some information regarding prior to admission                 Following commands: Intact        Cueing   Cueing Techniques: Verbal cues, Tactile cues  Exercises      Shoulder Instructions       General Comments RA    Pertinent Vitals/ Pain       Pain Assessment Pain Assessment: No/denies pain  Home Living                                          Prior Functioning/Environment              Frequency  Min 2X/week        Progress Toward Goals  OT Goals(current goals can now be found in the care plan section)  Progress towards OT goals: Progressing toward goals  Acute Rehab OT Goals Patient Stated Goal: to go to [facility] OT Goal Formulation: With patient Time For Goal Achievement: 08/07/24 Potential to Achieve Goals: Good ADL Goals Pt Will Perform Grooming: with contact guard assist;sitting Pt Will Perform Upper Body Dressing: with contact guard assist;sitting Pt Will Perform Lower Body Dressing: sit to/from stand;sitting/lateral leans;with min assist Pt Will Transfer to Toilet: with min assist;bedside commode;squat pivot transfer;stand pivot  transfer Additional ADL Goal #1: pt will demo good sitting balance in prep for seated ADLs  Plan      Co-evaluation    PT/OT/SLP Co-Evaluation/Treatment: Yes Reason for Co-Treatment: Complexity of the patient's impairments (multi-system involvement);Necessary to address cognition/behavior during functional activity;To address functional/ADL transfers   OT goals addressed during session: ADL's and self-care;Strengthening/ROM      AM-PAC OT 6 Clicks Daily Activity     Outcome Measure   Help from another person eating meals?: A Little Help from another person taking care of personal grooming?: A Little Help from another person toileting, which includes using toliet, bedpan, or urinal?: A Lot Help from another person bathing (including washing, rinsing, drying)?: A Lot Help from another person to put on and taking off regular upper body clothing?: A Lot Help from another person to put on and taking off regular lower body clothing?: A Lot 6 Click Score: 14    End of Session Equipment Utilized During Treatment: Rolling walker (2 wheels);Gait belt  OT Visit Diagnosis: Unsteadiness on feet (R26.81);Other abnormalities of gait and  mobility (R26.89);Muscle weakness (generalized) (M62.81)   Activity Tolerance Patient tolerated treatment well   Patient Left in chair;with call bell/phone within reach;with chair alarm set   Nurse Communication Mobility status;Precautions        Time: 9055-8990 OT Time Calculation (min): 25 min  Charges: OT General Charges $OT Visit: 1 Visit OT Treatments $Self Care/Home Management : 8-22 mins   Brynn, OTR/L  Acute Rehabilitation Services Office: [phone] .   Ely Molt 07/26/2024, 11:18 AM

## 2024-07-26 NOTE — Progress Notes (Signed)
 HD#0 SUBJECTIVE:  Patient Summary: Kathy Owens is a 68 y.o. with a pertinent PMH of cerebral palsy and active herpes zoster being treated with Valtrex who presented with concerns of increased spasticity and admitted for observation.   Overnight Events: None  Interim History: Patient is evaluated at bedside. She feels well overall, but she is concerned about the dryness of her face and would like some eyedrops. She states PT/OT had not gotten the chance to work with her yesterday. Additionally, she feels itchy on her back but has no other concerns.   OBJECTIVE:  Vital Signs: Vitals:   07/25/24 1604 07/26/24 0142 07/26/24 0437 07/26/24 0815  BP: (!) 127/59 130/71 (!) 144/66 129/70  Pulse: 76 72 70 63  Resp: 16 18 17    Temp: 97.9 F (36.6 C) 97.8 F (36.6 C) 97.7 F (36.5 C) 98.1 F (36.7 C)  TempSrc: Oral Oral Oral   SpO2: 99% 97% 97% 100%  Weight:      Height:       Supplemental O2: Room Air SpO2: 100 % O2 Flow Rate (L/min): 0 L/min  Filed Weights   07/24/24 0315  Weight: 67.3 kg     Intake/Output Summary (Last 24 hours) at 07/26/2024 0830 Last data filed at 07/26/2024 0437 Gross per 24 hour  Intake --  Output 700 ml  Net -700 ml   Net IO Since Admission: -365 mL [07/26/24 0830]  Physical Exam: Physical Exam Constitutional:      Appearance: Normal appearance.  HENT:     Head: Normocephalic and atraumatic.     Comments: dried crusted lesions in left V1 distribution above eyebrow/below eyelid without serous drainage. No ocular involvement.    Nose: Nose normal.     Mouth/Throat:     Mouth: Mucous membranes are moist.  Cardiovascular:     Rate and Rhythm: Normal rate and regular rhythm.     Pulses: Normal pulses.     Heart sounds: Normal heart sounds.  Pulmonary:     Effort: Pulmonary effort is normal.     Breath sounds: Normal breath sounds.  Abdominal:     General: Abdomen is flat. Bowel sounds are normal.     Palpations: Abdomen is soft.  Skin:     General: Skin is warm and dry.  Neurological:     General: No focal deficit present.     Mental Status: She is alert and oriented to person, place, and time.  Psychiatric:        Mood and Affect: Mood normal.        Behavior: Behavior normal.    Patient Lines/Drains/Airways Status     Active Line/Drains/Airways     Name Placement date Placement time Site Days   Peripheral IV 07/23/24 20 G 1 Right Antecubital 07/23/24  2320  Antecubital  1            Pertinent labs and imaging:      Latest Ref Rng & Units 07/24/2024    4:27 AM 07/23/2024   11:21 PM 07/13/2024    8:16 PM  CBC  WBC 4.0 - 10.5 K/uL 9.2  8.6  4.6   Hemoglobin 12.0 - 15.0 g/dL 86.1  86.2  86.3   Hematocrit 36.0 - 46.0 % 40.9  41.0  40.9   Platelets 150 - 400 K/uL 249  309  214        Latest Ref Rng & Units 07/26/2024    4:40 AM 07/25/2024    4:38 AM 07/24/2024  11:00 AM  CMP  Glucose 70 - 99 mg/dL 97  895  895   BUN 8 - 23 mg/dL 34  38  22   Creatinine 0.44 - 1.00 mg/dL 9.08  8.83  9.08   Sodium 135 - 145 mmol/L 134  134  133   Potassium 3.5 - 5.1 mmol/L 3.5  3.5  3.4   Chloride 98 - 111 mmol/L 102  103  98   CO2 22 - 32 mmol/L 20  20  22    Calcium  8.9 - 10.3 mg/dL 8.4  8.8  9.1     No results found.   ASSESSMENT/PLAN:  Assessment: Principal Problem:   Muscle spasticity Active Problems:   Cerebral palsy (HCC)   Chronic pain due to cerebral palsy   Chronic diarrhea of unknown origin   Impaired mobility   History of progressive weakness   Zoster, V1 distribution without ocular involvement  Kathy Owens is a 68 y.o. with a pertinent PMH of cerebral palsy and active herpes zoster being treated with Valtrex who presented with concerns of increased spasticity and admitted for observation.    Plan: #Generalized Muscle Spasticity  #Cerebral spasticity  Patient reporting increased stiffness causing weakness since starting Dantrolene 50mg . Thinks weakness is slightly better, but not markedly so.  Sees Dr. Lovorn with PM&R outpatient. Etiology for weakness likely multifactorial, including new medications (Dantrolene, Valtrex), recent infection with HSV exacerbating CNS, progression of disease, metabolic derangement (low magnesium, potassium), or aging. Will replenish electrolytes as appropriate. Spoke with CIR coordinator, who will have Dr. Cornelio assess patient for CIR placement. - Continue Flexeril  for spasms but hold other antispasmodics.  - Continue to monitor BMP, Mag - PT/OT recommending CIR vs SNF - Patient needs in-person follow up with PM&R to assess for weakness   #Impaired Mobility #History of Progressive weakness  Seems that patient has not been able to make multiple visits to clinic in person due to weakness and mobility issues with contractures starting beginning of this year being noted. Worsened when patient was on increasing doses of baclofen .  Patient states that taking dantrolene also worsened weakness. Could be that medications are contributing to weakness, but also could be that this is just progressive weakness from contractures that have caused deconditioning.    #Zoster, V1 distribution without ocular involvement  Diagnosed 9/27 and was started on Valtrex 1000mg  BID for renal dosing. Completed 10/14 days. Ophthalmology saw patient in ED on 9/27 and ruled out any ocular involvement but did note elevated intraocular pressure that will need to be followed up outpatient. On physical exam, lesions are dried, crusted over in V1 distribution on left side of face without serous drainage. Will send eye drops for any irritation caused by crust falling into eyes, and Eucerin cream for dryness of skin.  - Continue valacyclovir 1000 mg BID - Eucerin cream, eye drops   #Hypokalemia #Hypomagnesemia  Found POA with potassium of 3.3 and magnesium of 1.6. Replacing as appropriate. Gave 4g IV Magnesium sulfate. Patient has history of chronic diarrhea treated with imodium  which could be  contributing. Will continue to monitor.  - BMP, Mag  - Continue loperamide  2mg  daily   #Chronic pain due to cerebral palsy  On home hydrocodone  - acetaminophen  5-235 mg tablet. Will continue inpatient.   #hyperlipidemia  Continue Lipitor 10 mg.  Best Practice: Diet: Regular diet IVF: Fluids: none VTE: enoxaparin  (LOVENOX ) injection 40 mg Start: 07/24/24 1000 SCDs Start: 07/24/24 0200 Code: DNR/DNI  Disposition planning: Therapy Recs: SNF  Family Contact: sister Tiney) DISPO: Anticipated discharge in 1-2 days to Rehab pending Insurance for SNF coverage.  Signature:  Kamisha Ell, DO [facility]  8:30 AM, 07/26/2024  On Call pager [phone]
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
## 2024-07-27 ENCOUNTER — Inpatient Hospital Stay (HOSPITAL_COMMUNITY)

## 2024-07-27 DIAGNOSIS — G959 Disease of spinal cord, unspecified: Secondary | ICD-10-CM | POA: Diagnosis not present

## 2024-07-27 DIAGNOSIS — R29818 Other symptoms and signs involving the nervous system: Secondary | ICD-10-CM | POA: Diagnosis not present

## 2024-07-27 LAB — BASIC METABOLIC PANEL WITH GFR
Anion gap: 10 (ref 5–15)
BUN: 39 mg/dL — ABNORMAL HIGH (ref 8–23)
CO2: 21 mmol/L — ABNORMAL LOW (ref 22–32)
Calcium: 8.3 mg/dL — ABNORMAL LOW (ref 8.9–10.3)
Chloride: 102 mmol/L (ref 98–111)
Creatinine, Ser: 0.99 mg/dL (ref 0.44–1.00)
GFR, Estimated: >60 mL/min (ref >60)
Glucose, Bld: 106 mg/dL — ABNORMAL HIGH (ref 70–99)
Potassium: 3.6 mmol/L (ref 3.5–5.1)
Sodium: 133 mmol/L — ABNORMAL LOW (ref 135–145)

## 2024-07-27 LAB — MAGNESIUM: Magnesium: 1.8 mg/dL (ref 1.7–2.4)

## 2024-07-27 MED ORDER — GADOBUTROL 1 MMOL/ML IV SOLN
8.0000 mL | Freq: Once | INTRAVENOUS | Status: AC | PRN
Start: 2024-07-27 — End: 2024-07-27
  Administered 2024-07-27: 8 mL via INTRAVENOUS

## 2024-07-27 MED ORDER — BACLOFEN 10 MG PO TABS: 5.0000 mg | ORAL_TABLET | Freq: Two times a day (BID) | ORAL | Status: DC

## 2024-07-27 NOTE — Plan of Care (Signed)

## 2024-07-27 NOTE — Progress Notes (Addendum)
  Inpatient Rehabilitation Admissions Coordinator   Met with patient  at bedside for rehab assessment. We discussed goals and expectations of a possible CIR admit pending completion of medical workup and further discussions with Dr Lovorn. I will contact her sister, [name], today to clarify goals and expectations. Patient has lived at independent apartments/[facility] for > 20  years.  Please call me with any questions.   Heron Leavell, RN, MSN Rehab Admissions Coordinator [phone]    I spoke with her sister, [name], by phone. She reports until sister had the shingles, she was doing fine ambulating with her RW. NO need for a standard wheelchair or any wheelchair. [name]  reports she can provide her care after any type of rehab. I await medial workup completion and then will discuss options further with Dr Cornelio, patient/family and acute team.  Heron Leavell, RN, MSN Rehab Admissions Coordinator [phone] 07/27/2024 6:04 PM

## 2024-07-27 NOTE — Progress Notes (Addendum)
 HD#3 SUBJECTIVE:  Patient Summary: Kathy Owens is a 68 y.o. with a pertinent PMH of cerebral palsy and active herpes zoster being treated with Valtrex who presented with concerns of increased spasticity and admitted for observation.   Overnight Events: None  Interim History: Patient is evaluated at bedside. She is sitting upright in bed and feeling slightly anxious about the results of the MRIs from yesterday. She is also concerned about being on both Baclofen  and Flexeril  and is opting to remain on Flexeril  only. Otherwise, she feels well and has no concerns.  OBJECTIVE:  Vital Signs: Vitals:   07/26/24 2129 07/27/24 0523 07/27/24 0555 07/27/24 0822  BP: 138/66 138/70 114/68 134/86  Pulse: 71 66 63 64  Resp: 18 18 17    Temp: (!) 97.3 F (36.3 C) 98 F (36.7 C) 98.2 F (36.8 C) 98.1 F (36.7 C)  TempSrc: Oral Oral Oral   SpO2: 100% 99% 96% 100%  Weight:      Height:       Supplemental O2: Room Air SpO2: 100 % O2 Flow Rate (L/min): 0 L/min  Filed Weights   07/24/24 0315  Weight: 67.3 kg    No intake or output data in the 24 hours ending 07/27/24 1039  Net IO Since Admission: -365 mL [07/27/24 1039]  Physical Exam: Physical Exam Constitutional:      Appearance: Normal appearance.  HENT:     Head: Normocephalic and atraumatic.     Comments: Dried, crusted lesions in left V1 distribution improving above eyebrow/below eyelid. No ocular involvement. No change in vision.     Nose: Nose normal.     Mouth/Throat:     Mouth: Mucous membranes are moist.  Cardiovascular:     Rate and Rhythm: Normal rate and regular rhythm.     Pulses: Normal pulses.     Heart sounds: Normal heart sounds.  Pulmonary:     Effort: Pulmonary effort is normal.     Breath sounds: Normal breath sounds.  Abdominal:     General: Abdomen is flat. Bowel sounds are normal.     Palpations: Abdomen is soft.  Skin:    General: Skin is warm and dry.  Neurological:     General: No focal  deficit present.     Mental Status: She is alert and oriented to person, place, and time.  Psychiatric:        Mood and Affect: Mood normal.        Behavior: Behavior normal.    Patient Lines/Drains/Airways Status     Active Line/Drains/Airways     Name Placement date Placement time Site Days   Peripheral IV 07/23/24 20 G 1 Right Antecubital 07/23/24  2320  Antecubital  1            Pertinent labs and imaging:      Latest Ref Rng & Units 07/24/2024    4:27 AM 07/23/2024   11:21 PM 07/13/2024    8:16 PM  CBC  WBC 4.0 - 10.5 K/uL 9.2  8.6  4.6   Hemoglobin 12.0 - 15.0 g/dL 86.1  86.2  86.3   Hematocrit 36.0 - 46.0 % 40.9  41.0  40.9   Platelets 150 - 400 K/uL 249  309  214        Latest Ref Rng & Units 07/27/2024    3:57 AM 07/26/2024    4:40 AM 07/25/2024    4:38 AM  CMP  Glucose 70 - 99 mg/dL 893  97  104   BUN 8 - 23 mg/dL 39  34  38   Creatinine 0.44 - 1.00 mg/dL 9.00  9.08  8.83   Sodium 135 - 145 mmol/L 133  134  134   Potassium 3.5 - 5.1 mmol/L 3.6  3.5  3.5   Chloride 98 - 111 mmol/L 102  102  103   CO2 22 - 32 mmol/L 21  20  20    Calcium  8.9 - 10.3 mg/dL 8.3  8.4  8.8     MR CERVICAL SPINE W WO CONTRAST Result Date: 07/27/2024 EXAM: MRI CERVICAL SPINE WITH AND WITHOUT CONTRAST 07/26/2024 08:40:42 PM TECHNIQUE: Multiplanar multisequence MRI of the cervical spine was performed without and with the administration of intravenous contrast (7mL gadobutrol (GADAVIST) 1 MMOL/ML). COMPARISON: None available. CLINICAL HISTORY: Myelopathy, acute, cervical spine. Myelopathy, acute, cervical spine, weakness. FINDINGS: BONES AND ALIGNMENT: 2 mm of anterolisthesis of C4 on C5. Normal vertebral body heights. Marrow signal is unremarkable. No abnormal enhancement. SPINAL CORD: Normal spinal cord signal. SOFT TISSUES: No paraspinal mass. C2-C3: No significant disc herniation. No spinal canal stenosis or neural foraminal narrowing. C3-C4: Posterior disc osteophyte complex with  bilateral facet and uncovertebral hypertrophy. Resulting moderate to severe left and moderate right foraminal stenosis. Patent canal. C4-C5: 2 mm of anterolisthesis of C4 on C5. Bilateral facet and uncovertebral hypertrophy. Severe left and moderate to severe right foraminal stenosis. Mild canal stenosis. C5-C6: Posterior disc osteophyte complex and bilateral facet hypertrophy. No significant stenosis. C6-C7: Small posterior disc osteophyte complex. No significant stenosis. C7-T1: No significant disc herniation. No spinal canal stenosis or neural foraminal narrowing. IMPRESSION: 1. At C4-C5, severe left and moderate to severe right foraminal stenosis. Mild canal stenosis. 2. At C3-C4, moderate to severe left and moderate right foraminal stenosis. Electronically signed by: Gilmore Molt MD 07/27/2024 01:22 AM EDT RP Workstation: [id number]   MR BRAIN W WO CONTRAST Result Date: 07/27/2024 EXAM: MRI BRAIN WITH AND WITHOUT CONTRAST 07/26/2024 08:40:42 PM TECHNIQUE: Multiplanar multisequence MRI of the head/brain was performed with and without the administration of intravenous contrast. COMPARISON: MRI head February 13, 2024 CLINICAL HISTORY: Neuro deficit, acute, stroke suspected 7ml gadavist. FINDINGS: BRAIN AND VENTRICLES: No acute infarct. No acute intracranial hemorrhage. No mass effect or midline shift. No hydrocephalus. Normal flow voids. No mass or abnormal enhancement. Cerebral atrophy. Moderate T2 hyperintensity in the white matter, compatible with chronic microvascular ischemic change. ORBITS: No acute abnormality. SINUSES: No acute abnormality. BONES AND SOFT TISSUES: Normal bone marrow signal and enhancement. No acute soft tissue abnormality. IMPRESSION: 1. No acute intracranial abnormality. Electronically signed by: Gilmore Molt MD 07/27/2024 12:03 AM EDT RP Workstation: [id number]     ASSESSMENT/PLAN:  Assessment: Principal Problem:   Muscle spasticity Active Problems:   Cerebral palsy  (HCC)   Chronic pain due to cerebral palsy   Chronic diarrhea of unknown origin   Impaired mobility   History of progressive weakness   Zoster, V1 distribution without ocular involvement   Rapidly progressive weakness  Kathy Owens is a 68 y.o. with a pertinent PMH of cerebral palsy and active herpes zoster being treated with Valtrex who presented with concerns of increased spasticity and admitted for observation.    Plan: #Generalized Muscle Spasticity  #Cerebral spasticity  Patient reporting increased stiffness causing weakness since April 2025.  Dr. Lovorn evaluated for CIR admission yesterday, considering possible acute myelopathy or stroke. Recommending added Baclofen  5mg  BID to help with spasticity, however patient is concerned about increased sedation and would like  to not restart this medicine. MRI Brain showed no acute intracranial abnormality/stroke and chronic microvascular ischemic changes. MR Cervical spine showed moderate to severe left and moderate right C3-C4 foraminal stenosis, and severe left and right foraminal stenosis of C4-C5 with mild canal stenosis. Will order MR thoracic spine to assess for further stenosis.  - Continue Flexeril  for spasms  - Continue to monitor BMP, Mag - Pending CIR placement   #Impaired Mobility #History of Progressive weakness  Seems that patient has not been able to make multiple visits to clinic in person due to weakness and mobility issues with contractures starting beginning of this year being noted. Worsened when patient was on increased doses of baclofen  and dantrolene. Pending MRI Thoracic spine to identify any pathology. If consistently showing foraminal stenosis, will need to consult NSGY.   #Zoster, V1 distribution without ocular involvement  Diagnosed 9/27 and was started on Valtrex 1000mg  BID for renal dosing. On day 11/14. Ophthalmology saw patient in ED on 9/27 and ruled out any ocular involvement but did note elevated intraocular  pressure that will need to be followed up outpatient. Continue eye drops for any irritation caused by crust falling into eyes, and Eucerin cream for dryness of skin.  - Valacyclovir 1000 mg BID - Eucerin cream, eye drops   #Hypokalemia #Hypomagnesemia  Found POA with potassium of 3.3 and magnesium of 1.6. Patient has history of chronic diarrhea treated with imodium  which could be contributing. Will replenish electrolytes as appropriate.  - BMP, Mag  - Continue loperamide  2mg  daily   #Chronic pain due to cerebral palsy  On home hydrocodone  - acetaminophen  5-235 mg tablet. Will continue inpatient.   #hyperlipidemia  Continue Lipitor 10 mg.  Best Practice: Diet: Regular diet IVF: Fluids: none VTE: enoxaparin  (LOVENOX ) injection 40 mg Start: 07/24/24 1000 SCDs Start: 07/24/24 0200 Code: DNR/DNI  Disposition planning: Therapy Recs: CIR Family Contact: sister Tiney) DISPO: Anticipated discharge in 1-2 days to Rehab pending further imaging.  Signature:  Gurley Climer, DO [facility]  10:39 AM, 07/27/2024  On Call pager [phone]

## 2024-07-28 ENCOUNTER — Inpatient Hospital Stay (HOSPITAL_COMMUNITY)

## 2024-07-28 DIAGNOSIS — G809 Cerebral palsy, unspecified: Secondary | ICD-10-CM

## 2024-07-28 DIAGNOSIS — M48061 Spinal stenosis, lumbar region without neurogenic claudication: Secondary | ICD-10-CM

## 2024-07-28 DIAGNOSIS — M4802 Spinal stenosis, cervical region: Secondary | ICD-10-CM | POA: Diagnosis not present

## 2024-07-28 DIAGNOSIS — M62838 Other muscle spasm: Secondary | ICD-10-CM | POA: Diagnosis not present

## 2024-07-28 DIAGNOSIS — M5126 Other intervertebral disc displacement, lumbar region: Secondary | ICD-10-CM | POA: Diagnosis not present

## 2024-07-28 DIAGNOSIS — M51369 Other intervertebral disc degeneration, lumbar region without mention of lumbar back pain or lower extremity pain: Secondary | ICD-10-CM | POA: Diagnosis not present

## 2024-07-28 DIAGNOSIS — B029 Zoster without complications: Secondary | ICD-10-CM | POA: Diagnosis not present

## 2024-07-28 DIAGNOSIS — M47816 Spondylosis without myelopathy or radiculopathy, lumbar region: Secondary | ICD-10-CM | POA: Diagnosis not present

## 2024-07-28 LAB — BASIC METABOLIC PANEL WITH GFR
Anion gap: 9 (ref 5–15)
BUN: 37 mg/dL — ABNORMAL HIGH (ref 8–23)
CO2: 21 mmol/L — ABNORMAL LOW (ref 22–32)
Calcium: 8.4 mg/dL — ABNORMAL LOW (ref 8.9–10.3)
Chloride: 103 mmol/L (ref 98–111)
Creatinine, Ser: 0.99 mg/dL (ref 0.44–1.00)
GFR, Estimated: >60 mL/min (ref >60)
Glucose, Bld: 99 mg/dL (ref 70–99)
Potassium: 3.5 mmol/L (ref 3.5–5.1)
Sodium: 133 mmol/L — ABNORMAL LOW (ref 135–145)

## 2024-07-28 LAB — MAGNESIUM: Magnesium: 1.7 mg/dL (ref 1.7–2.4)

## 2024-07-28 MED ORDER — MAGNESIUM SULFATE 2 GM/50ML IV SOLN
2.0000 g | Freq: Once | INTRAVENOUS | Status: AC
  Administered 2024-07-28: 2 g via INTRAVENOUS
  Filled 2024-07-28: qty 50

## 2024-07-28 NOTE — Progress Notes (Signed)
 HD#3 SUBJECTIVE:  Patient Summary: Kathy Owens is a 68 y.o. with a pertinent PMH of cerebral palsy and active herpes zoster being treated with Valtrex who presented with concerns of increased spasticity and admitted for observation.   Overnight Events: None  Interim History: Patient is evaluated at bedside. She is eating breakfast comfortably and asks about the results of her MRI yesterday. She is also concerned about some irritation in her left eye, as she thinks some crust from her forehead is falling into her eye and causing irritation. Otherwise she feels well.   OBJECTIVE:  Vital Signs: Vitals:   07/27/24 2003 07/28/24 0539 07/28/24 0600 07/28/24 0820  BP: 125/64 (!) 141/116 (!) 142/99 136/69  Pulse: 77 83  62  Resp: 18 18  18   Temp: 98.6 F (37 C) 98.2 F (36.8 C)  97.7 F (36.5 C)  TempSrc: Oral Oral    SpO2: 95% 100%  98%  Weight:      Height:       Supplemental O2: Room Air SpO2: 98 % O2 Flow Rate (L/min): 0 L/min  Filed Weights   07/24/24 0315  Weight: 67.3 kg     Intake/Output Summary (Last 24 hours) at 07/28/2024 0949 Last data filed at 07/28/2024 0600 Gross per 24 hour  Intake --  Output 950 ml  Net -950 ml    Net IO Since Admission: -1,315 mL [07/28/24 0949]  Physical Exam: Physical Exam Constitutional:      Appearance: Normal appearance.  HENT:     Head: Normocephalic and atraumatic.     Comments: Dried, crusted lesions in left V1 distribution improving above eyebrow/below eyelid. No ocular involvement. No change in vision. Almost completely healed.    Nose: Nose normal.     Mouth/Throat:     Mouth: Mucous membranes are moist.  Cardiovascular:     Rate and Rhythm: Normal rate and regular rhythm.     Pulses: Normal pulses.     Heart sounds: Normal heart sounds.  Pulmonary:     Effort: Pulmonary effort is normal.     Breath sounds: Normal breath sounds.  Abdominal:     General: Abdomen is flat. Bowel sounds are normal.     Palpations:  Abdomen is soft.  Skin:    General: Skin is warm and dry.  Neurological:     General: No focal deficit present.     Mental Status: She is alert and oriented to person, place, and time.  Psychiatric:        Mood and Affect: Mood normal.        Behavior: Behavior normal.    Patient Lines/Drains/Airways Status     Active Line/Drains/Airways     Name Placement date Placement time Site Days   Peripheral IV 07/23/24 20 G 1 Right Antecubital 07/23/24  2320  Antecubital  1            Pertinent labs and imaging:      Latest Ref Rng & Units 07/24/2024    4:27 AM 07/23/2024   11:21 PM 07/13/2024    8:16 PM  CBC  WBC 4.0 - 10.5 K/uL 9.2  8.6  4.6   Hemoglobin 12.0 - 15.0 g/dL 86.1  86.2  86.3   Hematocrit 36.0 - 46.0 % 40.9  41.0  40.9   Platelets 150 - 400 K/uL 249  309  214        Latest Ref Rng & Units 07/28/2024    3:37 AM 07/27/2024  3:57 AM 07/26/2024    4:40 AM  CMP  Glucose 70 - 99 mg/dL 99  893  97   BUN 8 - 23 mg/dL 37  39  34   Creatinine 0.44 - 1.00 mg/dL 9.00  9.00  9.08   Sodium 135 - 145 mmol/L 133  133  134   Potassium 3.5 - 5.1 mmol/L 3.5  3.6  3.5   Chloride 98 - 111 mmol/L 103  102  102   CO2 22 - 32 mmol/L 21  21  20    Calcium  8.9 - 10.3 mg/dL 8.4  8.3  8.4     MR THORACIC SPINE W WO CONTRAST Result Date: 07/28/2024 EXAM: MRI THORACIC SPINE WITH AND WITHOUT INTRAVENOUS CONTRAST 07/27/2024 09:25:00 PM TECHNIQUE: Multiplanar multisequence MRI of the thoracic spine was performed with and without the administration of intravenous contrast. COMPARISON: Brain MRI and cervical spine MRI 07/26/2024, chest radiographs 05/04/2008. CLINICAL HISTORY: 68 year old female with neurologic deficit Myelopathy, acute, thoracic spine. FINDINGS: BONES AND ALIGNMENT: Limited imaging of the cervical spine appears stable from yesterday's MRI. Thoracic spine segmentation appears to be normal. Thoracic kyphosis is mildly exaggerated but appears similar to the 2019 radiographs. There  is subtle degenerative appearing anterolisthesis in the upper thoracic spine most pronounced at T2-T3. Normal background bone marrow signal. Generally maintained thoracic vertebral body height. Small chronic degenerative Endplate Schmorl nodes at multiple levels. No marrow edema or acute osseous abnormality identified. No suspicious enhancement or suspicious marrow lesion. SPINAL CORD: Capacious thoracic spinal canal at most levels and normal thoracic spinal cord signal and morphology at most levels. Conus medullaris occurs at T12-L1. No spinal cord edema or myelomalacia. No discrete spinal cord lesion. No abnormal intradural enhancement or dural thickening identified. SOFT TISSUES: There is a small 13 mm hemorrhagic cyst of the left renal upper pole which is exophytic with layering hematocrit level (series 20 x 46) which appears benign and no follow up imaging is recommended . Negative for other visible thoracic and upper abdominal findings. DEGENERATIVE CHANGES: Age advanced thoracic spine degeneration only at the following levels: T3-T4: Broad based right paracentral disc or disc osteophyte complex with effaced central CSF space and mild mass effect on the right hemicord (series 20 image 14), the capacious spinal canal. No spinal or foraminal stenosis. T6-T7: Broad based left paracentral disc or disc osteophyte complex with similar effaced central CSF space and mild ventral cord mass effect (series 20 image 24). Capacious spinal canal. No stenosis. T10-T11: Small but broad based left paracentral disc protrusion (series 20 image 42). Mild facet hypertrophy greater on the right with trace degenerative facet joint fluid. No stenosis. And Additionally at L1-L2 there is partially visible circumferential disc bulging and degenerative appearing posterior element hypertrophy which results in mild to moderate spinal stenosis at or near the level of the conus medullaris on series 17 image 11. No conus signal abnormality  identified. IMPRESSION: 1. No acute findings in the thoracic spine. No significant thoracic spinal or foraminal stenosis. No thoracic spinal Cord Signal abnormality 2. Positive for partially visualized L1-L2 level spinal stenosis, up to moderate and at or near the tip of the conus medullaris. No conus signal abnormality. Electronically signed by: Helayne Hurst MD 07/28/2024 04:22 AM EDT RP Workstation: [id number]     ASSESSMENT/PLAN:  Assessment: Principal Problem:   Muscle spasticity Active Problems:   Cerebral palsy (HCC)   Chronic pain due to cerebral palsy   Chronic diarrhea of unknown origin   Impaired mobility  History of progressive weakness   Zoster, V1 distribution without ocular involvement   Rapidly progressive weakness   Spinal stenosis in cervical region  Kathy Owens is a 68 y.o. with a pertinent PMH of cerebral palsy and active herpes zoster being treated with Valtrex who presented with concerns of increased spasticity and admitted for observation.    Plan: #Generalized Muscle Spasticity  #Cerebral spasticity  Patient reporting increased stiffness causing weakness since April 2025.  Dr. Cornelio evaluated for CIR admission 10/7, considering possible acute myelopathy or stroke. MR Cervical spine showed moderate to severe left and moderate right C3-C4 foraminal stenosis, and severe left and right foraminal stenosis of C4-C5 with mild canal stenosis. MR thoracic spine shows no stenosis, however partial visualization of L1-L2 shows moderate stenosis at or near the tip of the conus medullaris. Pending further recommendations from PM&R for possible NSGY consult, as stenosis of cervical spine should not be contributing to patient's increased weakness or spasticity of her lower extremities. - Continue Flexeril  for spasms  - Continue to monitor BMP, Mag - Pending CIR placement - Pending further PM&R recommendations   #Impaired Mobility #History of Progressive weakness  Seems that  patient has not been able to make multiple visits to clinic in person due to weakness and mobility issues with contractures starting beginning of this year being noted. Worsened when patient was on increased doses of baclofen  and dantrolene. MRI Thoracic spine without any pathology.    #Zoster, V1 distribution without ocular involvement, resolving Diagnosed 9/27 and was started on Valtrex 1000mg  BID for renal dosing. On day 12/14. Ophthalmology saw patient in ED on 9/27 and ruled out any ocular involvement but did note elevated intraocular pressure that will need to be followed up outpatient. Continue eye drops for any irritation caused by crust falling into eyes, and Eucerin cream for dryness of skin.  - Valacyclovir 1000 mg BID - Eucerin cream, eye drops   #Hypokalemia, resolved #Hypomagnesemia, resolved Found POA with potassium of 3.3 and magnesium of 1.6. Patient has history of chronic diarrhea treated with imodium  which could be contributing. Will replenish electrolytes as appropriate.  - BMP, Mag  - Continue loperamide  2mg  daily   #Chronic pain due to cerebral palsy  On home hydrocodone  - acetaminophen  5-235 mg tablet. Will continue inpatient.   #hyperlipidemia  Continue Lipitor 10 mg.  Best Practice: Diet: Regular diet IVF: Fluids: none VTE: enoxaparin  (LOVENOX ) injection 40 mg Start: 07/24/24 1000 SCDs Start: 07/24/24 0200 Code: DNR/DNI  Disposition planning: Therapy Recs: CIR Family Contact: sister Tiney) DISPO: Anticipated discharge in 1-2 days to Rehab pending further imaging.  Signature:  Brittanni Cariker, DO [facility]  9:49 AM, 07/28/2024  On Call pager [phone]
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
## 2024-07-28 NOTE — Plan of Care (Signed)

## 2024-07-28 NOTE — Progress Notes (Signed)
 D/W patient as well as IM resident about pt and MRI results- also personally went over MRI's- independently and results as well  Also called NSU to discuss pt and MRI's.   Asked IM resident for a Neuro consult.  No change in pt's strength on exam today- on average 2-/5 in LE's and also weak in UEs- please see my consult for specifics.  Neuro- MAS of 3- ie the tightest a patient can be, regarding spasticity- it's called the Modified Ashworth scale- used my Rehab and Neuro to grade tightness/tone.   Pt ALSO had severe spasms as soon as I touched her legs- few beats clonus in LE's B/L  D/w pt- flexeril  might help the PAIN of spasms, or tone, but it doesn't TREAT the spasticity very well- it's been used as an adjuvant, but never a primary medicine.   Also d/w pt there's only 4 meds approved for true Upper motor neuron spasticity- that's's Baclofen  which caused her to hallucinate at higher (10 mg) doses; Dantrolene- which was successful at treating spasticity but uncovered her weakness more than she wanted; Valium  which is not appropriate for her due to controlled status and sedation and Zanaflex - which can lower BP, cause sedation in 30-40% of pt's, so I'd rather not use for her.   I feel like pt needs a Neuro consult, because her tone has been severe/progressively worse for past 6 months- and completely stopped walking last 1 month or so so it wasn't due to increased tone from infection - I/e shingles.  Or not soley AND has not improved as her infection is treated, which is common if shingles is the cause of increased Sx's.   Her MRI's are basically not likely the cause of her increased weakness and spasticity/tone-   Pt did agree to take a lower dose of dantrolene- I suggest 25 mg daily to BID initially- was started on 50 mg initially, thje first time a few weeks ago- again did treat her tone- just uncovered her weakness- I don't think  it made her weak, she was already weak, but tone covered it  up.   Pt is an appropriate candidate for inpt rehab for 3-4 weeks to improve tone, hopefully make her a little stronger and do power w/c eval, however I think she would benefit best if we had an underlying dx. I am hoping Neuro might have some ideas. I understand pt is complex, but her tone was so mild 6 months ago and allowed her to walk short distances- wasn't even using AFO's or more than RW at that time- was against any assistive devices actually.   I spent a total of 53    minutes on total care today- >50% coordination of care- due to d/w pt at length, the PT in room; IM resident multiple times, NSU and read MRI indep.

## 2024-07-28 NOTE — Progress Notes (Addendum)
 Physical Therapy Treatment Patient Details Name: Kathy Owens MRN: [id number] DOB: 1956/07/28 Today's Date: 07/28/2024   History of Present Illness 68 y/o F presenting to ED on 10/4 with incr spasticity x2 weeks. PMH includes cerebral palsy, herpes zoster on valtrex, HTN, anterior ischemic bilateral optic neuropathy, vertigo, HLD, osteoporosis    PT Comments  Patient progressing with mobility, less assistance coming to sit and for transfer to recliner using Stedy with sit to stand mod A of 2 to [place].  She demonstrates S for sitting balance after placed and holding rail.  Able to use Stedy for blocking legs for improved weight bearing.  She remains appropriate for post-acute inpatient rehab (eager and could do >/=3 hrs/day).  PT will continue to follow.     If plan is discharge home, recommend the following: A lot of help with walking and/or transfers;A lot of help with bathing/dressing/bathroom;Assistance with cooking/housework;Assist for transportation;Help with stairs or ramp for entrance   Can travel by private vehicle        Equipment Recommendations       Recommendations for Other Services       Precautions / Restrictions Precautions Precautions: Fall Precaution/Restrictions Comments: hx of CP with spasticity     Mobility  Bed Mobility Overal bed mobility: Needs Assistance Bed Mobility: Supine to Sit     Supine to sit: HOB elevated, Max assist     General bed mobility comments: assist for legs off EOB and lifting trunk, reciprocal scooting with A and cues for weight shift, holding bed rail for balance    Transfers Overall transfer level: Needs assistance Equipment used: Ambulation equipment used Transfers: Sit to/from Stand, Bed to chair/wheelchair/BSC Sit to Stand: Mod assist, +2 physical assistance, From elevated surface Stand pivot transfers: Total assist         General transfer comment: to [facility] after feet placed on platform and pillow between her knees  and shin pad; pulled up to stand with A of 2 for safety, seated on flaps to transfer to recliner    Ambulation/Gait                   Stairs             Wheelchair Mobility     Tilt Bed    Modified Rankin (Stroke Patients Only)       Balance Overall balance assessment: Needs assistance   Sitting balance-Leahy Scale: Poor Sitting balance - Comments: holding rail for balance and initial A due to posterior lean   Standing balance support: Bilateral upper extremity supported Standing balance-Leahy Scale: Poor Standing balance comment: bilat UE supported on rail for Stedy and min A of 2 for balance placing flaps under her                            Communication Communication Communication: Impaired Factors Affecting Communication: Hearing impaired  Cognition Arousal: Alert Behavior During Therapy: WFL for tasks assessed/performed                             Following commands: Intact      Cueing Cueing Techniques: Verbal cues, Tactile cues  Exercises Other Exercises Other Exercises: AAROM/PROM hip abd/add, heel slides (more A on R) ankle PF/pronation; assisted/stabilized bridging x 5    General Comments General comments (skin integrity, edema, etc.): noted ammonia smell from bed pad due to purewick leaking and urine  odor, RN Aware      Pertinent Vitals/Pain Pain Assessment Pain Assessment: No/denies pain    Home Living                          Prior Function            PT Goals (current goals can now be found in the care plan section) Progress towards PT goals: Progressing toward goals    Frequency    Min 2X/week      PT Plan      Co-evaluation              AM-PAC PT 6 Clicks Mobility   Outcome Measure  Help needed turning from your back to your side while in a flat bed without using bedrails?: Total Help needed moving from lying on your back to sitting on the side of a flat bed without  using bedrails?: Total Help needed moving to and from a bed to a chair (including a wheelchair)?: Total Help needed standing up from a chair using your arms (e.g., wheelchair or bedside chair)?: Total Help needed to walk in hospital room?: Total Help needed climbing 3-5 steps with a railing? : Total 6 Click Score: 6    End of Session Equipment Utilized During Treatment: Gait belt Activity Tolerance: Patient tolerated treatment well Patient left: in chair;with call bell/phone within reach;with chair alarm set Nurse Communication: Mobility status;Need for lift equipment PT Visit Diagnosis: Unsteadiness on feet (R26.81);Other abnormalities of gait and mobility (R26.89);Muscle weakness (generalized) (M62.81)     Time: 8566-8496 PT Time Calculation (min) (ACUTE ONLY): 30 min  Charges:    $Therapeutic Exercise: 8-22 mins $Therapeutic Activity: 8-22 mins PT General Charges $$ ACUTE PT VISIT: 1 Visit                     Micheline Portal, PT Acute Rehabilitation Services Office:[phone] 07/28/2024    Montie Portal 07/28/2024, 3:46 PM

## 2024-07-29 DIAGNOSIS — M4802 Spinal stenosis, cervical region: Secondary | ICD-10-CM | POA: Diagnosis not present

## 2024-07-29 DIAGNOSIS — M62838 Other muscle spasm: Secondary | ICD-10-CM | POA: Diagnosis not present

## 2024-07-29 DIAGNOSIS — B029 Zoster without complications: Secondary | ICD-10-CM | POA: Diagnosis not present

## 2024-07-29 DIAGNOSIS — G809 Cerebral palsy, unspecified: Secondary | ICD-10-CM | POA: Diagnosis not present

## 2024-07-29 LAB — BASIC METABOLIC PANEL WITH GFR
Anion gap: 11 (ref 5–15)
BUN: 38 mg/dL — ABNORMAL HIGH (ref 8–23)
CO2: 20 mmol/L — ABNORMAL LOW (ref 22–32)
Calcium: 8.1 mg/dL — ABNORMAL LOW (ref 8.9–10.3)
Chloride: 104 mmol/L (ref 98–111)
Creatinine, Ser: 0.94 mg/dL (ref 0.44–1.00)
GFR, Estimated: >60 mL/min (ref >60)
Glucose, Bld: 96 mg/dL (ref 70–99)
Potassium: 3.5 mmol/L (ref 3.5–5.1)
Sodium: 135 mmol/L (ref 135–145)

## 2024-07-29 LAB — PHOSPHORUS: Phosphorus: 3.5 mg/dL (ref 2.5–4.6)

## 2024-07-29 LAB — MAGNESIUM: Magnesium: 2.1 mg/dL (ref 1.7–2.4)

## 2024-07-29 MED ORDER — DANTROLENE SODIUM 25 MG PO CAPS
25.0000 mg | ORAL_CAPSULE | Freq: Two times a day (BID) | ORAL | Status: DC
  Administered 2024-07-29 – 2024-08-04 (×13): 25 mg via ORAL
  Filled 2024-07-29 (×17): qty 1
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
## 2024-07-29 NOTE — Plan of Care (Signed)

## 2024-07-29 NOTE — TOC Progression Note (Signed)
 Transition of Care [facility]) - Progression Note    Patient Details  Name: Kathy Owens MRN: [id number] Date of Birth: 1955/11/15  [facility] CM/SW Contact  Rosaline JONELLE Joe, RN Phone Number: 07/29/2024, 3:06 PM  Clinical Narrative:    Patient was seen by Dr. Lovorn, with CIR and inpatient rehabilitation is recommended. IP care management team will continue to follow patient - CIR is recommended at this time per notes.                     Expected Discharge Plan and Services                                               Social Drivers of Health (SDOH) Interventions SDOH Screenings   Food Insecurity: No Food Insecurity (07/24/2024)  Housing: Low Risk  (07/24/2024)  Transportation Needs: No Transportation Needs (07/24/2024)  Utilities: Not At Risk (07/24/2024)  Alcohol Screen: Low Risk  (06/17/2023)  Depression (PHQ2-9): Low Risk  (05/05/2024)  Financial Resource Strain: Low Risk  (06/17/2023)  Physical Activity: Insufficiently Active (06/17/2023)  Social Connections: Socially Isolated (07/24/2024)  Stress: No Stress Concern Present (06/17/2023)  Tobacco Use: Low Risk  (07/23/2024)  Health Literacy: Adequate Health Literacy (06/17/2023)    Readmission Risk Interventions     No data to display

## 2024-07-29 NOTE — Progress Notes (Signed)
 Physical Therapy Treatment Patient Details Name: Kathy Owens MRN: [id number] DOB: January 17, 1956 Today's Date: 07/29/2024   History of Present Illness 68 y/o F presenting to ED on 10/4 with incr spasticity x2 weeks. PMH includes cerebral palsy, herpes zoster on valtrex, HTN, anterior ischemic bilateral optic neuropathy, vertigo, HLD, osteoporosis    PT Comments  Patient progressing able to transfer with one assist with [facility] though not able to stand to RW with one assist.  She performed multiple sit to stands from [place] flaps then in recliner attempting to perform reciprocal scooting tor trunk dissociation.  PT  will continue to follow in the acute setting.  Planning on inpatient rehab stay prior to d/c home.    If plan is discharge home, recommend the following: A lot of help with walking and/or transfers;A lot of help with bathing/dressing/bathroom;Assistance with cooking/housework;Assist for transportation;Help with stairs or ramp for entrance   Can travel by private vehicle     No  Equipment Recommendations  Other (comment) (TBA)    Recommendations for Other Services       Precautions / Restrictions Precautions Precautions: Fall Precaution/Restrictions Comments: hx of CP with spasticity     Mobility  Bed Mobility Overal bed mobility: Needs Assistance       Supine to sit: HOB elevated, Max assist, Used rails     General bed mobility comments: assist to move legs off EOB; cues for using rail to lift trunk though still needing help to lift trunk and gain balance at EOB    Transfers Overall transfer level: Needs assistance Equipment used: Rolling walker (2 wheels), Ambulation equipment used Transfers: Sit to/from Stand, Bed to chair/wheelchair/BSC Sit to Stand: From elevated surface, Via lift equipment, Mod assist, Max assist Stand pivot transfers: Total assist         General transfer comment: attempted to stand at [facility] with feet placed on floor and held though unable  to clear hips with 3 attempts.  Used Stedy and pt able to stand and from seat stood x 10 reps; transferred to recliner with Stedy and positioned in chair attempting reciprocal scooting back on seat then with max A.    Ambulation/Gait                   Stairs             Wheelchair Mobility     Tilt Bed    Modified Rankin (Stroke Patients Only)       Balance Overall balance assessment: Needs assistance Sitting-balance support: Feet supported Sitting balance-Leahy Scale: Poor Sitting balance - Comments: UE support for balance on EOB   Standing balance support: Bilateral upper extremity supported Standing balance-Leahy Scale: Poor Standing balance comment: B UE supported on rail for Stedy and min A                            Communication Communication Communication: Impaired Factors Affecting Communication: Hearing impaired;Reduced clarity of speech  Cognition Arousal: Alert Behavior During Therapy: WFL for tasks assessed/performed   PT - Cognitive impairments: No apparent impairments                         Following commands: Intact      Cueing Cueing Techniques: Verbal cues  Exercises General Exercises - Lower Extremity Long Arc Quad: AROM, Both, 10 reps, Seated Other Exercises Other Exercises: AAROM/PROM hip abd/add, heel slides (more A on R)  ankle PF/pronation    General Comments General comments (skin integrity, edema, etc.): Pt initially on bed pan, rolled and A for hygiene, replaced clean purewick after toileting.      Pertinent Vitals/Pain Pain Assessment Pain Assessment: No/denies pain    Home Living                          Prior Function            PT Goals (current goals can now be found in the care plan section) Progress towards PT goals: Progressing toward goals    Frequency    Min 2X/week      PT Plan      Co-evaluation              AM-PAC PT 6 Clicks Mobility   Outcome  Measure  Help needed turning from your back to your side while in a flat bed without using bedrails?: A Lot Help needed moving from lying on your back to sitting on the side of a flat bed without using bedrails?: Total Help needed moving to and from a bed to a chair (including a wheelchair)?: Total Help needed standing up from a chair using your arms (e.g., wheelchair or bedside chair)?: Total Help needed to walk in hospital room?: Total Help needed climbing 3-5 steps with a railing? : Total 6 Click Score: 7    End of Session Equipment Utilized During Treatment: Gait belt Activity Tolerance: Patient tolerated treatment well Patient left: in chair;with call bell/phone within reach;with chair alarm set Nurse Communication: Mobility status;Need for lift equipment PT Visit Diagnosis: Unsteadiness on feet (R26.81);Other abnormalities of gait and mobility (R26.89);Muscle weakness (generalized) (M62.81)     Time: 8749-8673 PT Time Calculation (min) (ACUTE ONLY): 36 min  Charges:    $Therapeutic Activity: 23-37 mins PT General Charges $$ ACUTE PT VISIT: 1 Visit                     Micheline Portal, PT Acute Rehabilitation Services Office:[phone] 07/29/2024    Montie Portal 07/29/2024, 2:08 PM

## 2024-07-29 NOTE — Care Management Important Message (Signed)
 Important Message  Patient Details  Name: Kathy Owens MRN: [id number] Date of Birth: [birth year]   Important Message Given:  Yes - Medicare IM     Claretta Deed 07/29/2024, 3:14 PM
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
## 2024-07-29 NOTE — Plan of Care (Signed)

## 2024-07-29 NOTE — Progress Notes (Addendum)
 HD#3 SUBJECTIVE:  Patient Summary: Kathy Owens is a 68 y.o. with a pertinent PMH of cerebral palsy and recently treated herpes zoster (Valtrex) who presented with concerns of increased spasticity and admitted for observation.   Overnight Events: None  Interim History: Patient is evaluated at bedside. She is again anxious this morning, as she could not sleep last night thinking about the results of her MRI. Discussed the results and the plan with her, which seemed to alleviate her nerves some. She is agreeable to starting Dantrolene 25mg  BID again, and will be vigilant on how her symptoms are surrounding the medication. Otherwise, she still has some itching on her back but feels well.   OBJECTIVE:  Vital Signs: Vitals:   07/28/24 2321 07/29/24 0437 07/29/24 0742 07/29/24 1113  BP: 134/62 129/62 (!) 150/68 133/62  Pulse: 86 77 70 77  Resp: 20 16 18 16   Temp: 98.2 F (36.8 C) 98.6 F (37 C) 97.9 F (36.6 C) 97.6 F (36.4 C)  TempSrc: Oral Oral Oral   SpO2: 99% 96% 99% 98%  Weight:      Height:       Supplemental O2: Room Air SpO2: 98 % O2 Flow Rate (L/min): 0 L/min  Filed Weights   07/24/24 0315  Weight: 67.3 kg     Intake/Output Summary (Last 24 hours) at 07/29/2024 1156 Last data filed at 07/28/2024 2237 Gross per 24 hour  Intake 50 ml  Output 400 ml  Net -350 ml    Net IO Since Admission: -1,665 mL [07/29/24 1156]  Physical Exam: Physical Exam Constitutional:      Appearance: Normal appearance.  HENT:     Head: Normocephalic and atraumatic.     Comments: Dried, crusted lesions in left V1 distribution improving above eyebrow/below eyelid. No ocular involvement. No change in vision. Almost completely healed.    Nose: Nose normal.     Mouth/Throat:     Mouth: Mucous membranes are moist.  Cardiovascular:     Rate and Rhythm: Normal rate and regular rhythm.     Pulses: Normal pulses.     Heart sounds: Normal heart sounds.  Pulmonary:     Effort: Pulmonary  effort is normal.     Breath sounds: Normal breath sounds.  Abdominal:     General: Abdomen is flat. Bowel sounds are normal.     Palpations: Abdomen is soft.  Skin:    General: Skin is warm and dry.  Neurological:     General: No focal deficit present.     Mental Status: She is alert and oriented to person, place, and time.  Psychiatric:        Mood and Affect: Mood normal.        Behavior: Behavior normal.    Patient Lines/Drains/Airways Status     Active Line/Drains/Airways     Name Placement date Placement time Site Days   Peripheral IV 07/23/24 20 G 1 Right Antecubital 07/23/24  2320  Antecubital  1            Pertinent labs and imaging:      Latest Ref Rng & Units 07/24/2024    4:27 AM 07/23/2024   11:21 PM 07/13/2024    8:16 PM  CBC  WBC 4.0 - 10.5 K/uL 9.2  8.6  4.6   Hemoglobin 12.0 - 15.0 g/dL 86.1  86.2  86.3   Hematocrit 36.0 - 46.0 % 40.9  41.0  40.9   Platelets 150 - 400 K/uL 249  309  214        Latest Ref Rng & Units 07/29/2024    5:04 AM 07/28/2024    3:37 AM 07/27/2024    3:57 AM  CMP  Glucose 70 - 99 mg/dL 96  99  893   BUN 8 - 23 mg/dL 38  37  39   Creatinine 0.44 - 1.00 mg/dL 9.05  9.00  9.00   Sodium 135 - 145 mmol/L 135  133  133   Potassium 3.5 - 5.1 mmol/L 3.5  3.5  3.6   Chloride 98 - 111 mmol/L 104  103  102   CO2 22 - 32 mmol/L 20  21  21    Calcium  8.9 - 10.3 mg/dL 8.1  8.4  8.3     MR Lumbar Spine W Wo Contrast Result Date: 07/28/2024 EXAM: MRI LUMBAR SPINE 07/28/2024 09:39:44 PM TECHNIQUE: Multiplanar multisequence MRI of the lumbar spine was performed without and with the administration of 8mL intravenous contrast (gadobutrol (GADAVIST) 1 MMOL/ML injection 8 mL GADOBUTROL 1 MMOL/ML IV SOLN). COMPARISON: None available. CLINICAL HISTORY: Increasing bilateral leg spasticity and weakness. FINDINGS: BONES AND ALIGNMENT: Normal alignment. Normal vertebral body heights. Bone marrow signal is unremarkable. SPINAL CORD: The conus  terminates normally. No abnormal enhancement.  No abnormal enhancement. SOFT TISSUES: No paraspinal mass. L1-L2: Broad disc bulge with ligamentum flavum thickening with resulting severe canal stenosis. No significant foraminal stenosis. L2-L3: Mild disc bulge. No significant foraminal stenosis. Mild canal stenosis. L3-L4: Disc bulge and ligamentum flavum thickening. Superimposed right far lateral disc protrusion which closely approximates the exiting/exited right nerve. Mild right foraminal stenosis. Mild canal stenosis. L4-L5: Mild disc bulge and bilateral facet arthropathy. Mild canal stenosis. Mild left foraminal stenosis. L5-S1: No significant disc herniation. No spinal canal stenosis or neural foraminal narrowing. IMPRESSION: 1. At L1-L2, severe canal stenosis. 2. At L3-L4, right far lateral disc protrusion which closely approximates the exiting/exited right nerve. Mild right foraminal stenosis. Electronically signed by: Gilmore Molt MD 07/28/2024 11:34 PM EDT RP Workstation: [id number]     ASSESSMENT/PLAN:  Assessment: Principal Problem:   Muscle spasticity Active Problems:   Cerebral palsy (HCC)   Chronic pain due to cerebral palsy   Chronic diarrhea of unknown origin   Impaired mobility   History of progressive weakness   Zoster, V1 distribution without ocular involvement   Rapidly progressive weakness   Spinal stenosis in cervical region  Kathy Owens is a 68 y.o. with a pertinent PMH of cerebral palsy and active herpes zoster being treated with Valtrex who presented with concerns of increased spasticity and admitted for observation.    Plan: #Generalized Muscle Spasticity  #Cerebral spasticity  Patient reporting increased stiffness causing weakness since April 2025.  Dr. Cornelio evaluated for CIR admission 10/7, and believes patient to be appropriate candidate for inpatient rehab for strengthening and a power wheelchair evaluation. However, there is an unclear reason for her  upper motor neuron spasticity. Patient's tone in musculature was mild and she was able to ambulate short distances with minimal DME (only RW) in April.  Neurology evaluated patient and is recommending checking ionized calcium  and phosphorus, as well as trialing other medications like Clonazepam, Methocarbamol , or outpatient Botox or baclofen  pump. Ordered MRI [place] spine, showing broad disc bulge with ligamentum flavum thickening, resulting in severe canal stenosis at L1-L2, and lateral far right disc protrusion closely approximating the exiting right nerve. Mild canal stenosis from L2-L5. Neurology signing off, appreciate their recommendations. Will relay to PM&R and  CIR coordinator for further CIR evaluation. Consulted NSGY for further recommendations regarding new lumbar spine findings causing severe  canal stenosis. - Continue Flexeril  for spasms  - Start Dantrolene 25mg  BID per PM&R - Pending ionized calcium  and phosphorus - Continue to monitor BMP, Mag - Pending CIR placement - NSGY recommendations   #Impaired Mobility #History of Progressive weakness  Seems that patient has not been able to make multiple visits to clinic in person due to weakness and mobility issues with contractures starting beginning of this year being noted. Worsened when patient was on increased doses of baclofen  and dantrolene. MRI Thoracic spine without any pathology. MRI Lumbar spine showing severe canal stenosis at L1-L2. Patient could benefit from outpatient neurology follow up for possible Botox injections with Dr. Asberry Tat.    #Zoster, V1 distribution without ocular involvement, resolving Diagnosed 9/27 and was started on Valtrex 1000mg  BID for renal dosing. On day 13/14. Ophthalmology saw patient in ED on 9/27 and ruled out any ocular involvement but did note elevated intraocular pressure that will need to be followed up outpatient. Continue eye drops for any irritation caused by crust falling into eyes, and  Eucerin cream for dryness of skin. Healing well.  - Valacyclovir 1000 mg BID - Eucerin cream, eye drops   #Hypokalemia, resolved #Hypomagnesemia, resolved Found POA with potassium of 3.3 and magnesium of 1.6. Patient has history of chronic diarrhea treated with imodium  which could be contributing. Will replenish electrolytes as appropriate.  - BMP, Mag  - Continue loperamide  2mg  daily   #Chronic pain due to cerebral palsy  On home hydrocodone  - acetaminophen  5-235 mg tablet. Will continue inpatient.   #hyperlipidemia  Continue Lipitor 10 mg.  Best Practice: Diet: Regular diet IVF: Fluids: none VTE: enoxaparin  (LOVENOX ) injection 40 mg Start: 07/24/24 1000 SCDs Start: 07/24/24 0200 Code: DNR/DNI  Disposition planning: Therapy Recs: CIR Family Contact: sister Tiney) DISPO: Anticipated discharge in 1-2 days to CIR pending further NSGY recommendations.  Signature:  Jaden Amilibia, DO [facility]  11:56 AM, 07/29/2024  On Call pager [phone]   Internal Medicine Attending:  I personally saw and examined the patient. Discussed with the resident and agree with the resident's findings and plan of care as documented in the resident's note. Will discuss with neurosurgery.  Eval by CIR Eben Reyes BROCKS, MD

## 2024-07-29 NOTE — Progress Notes (Signed)
   Inpatient Rehabilitation Admissions Coordinator   We will follow up on Monday with her progress and likely begin Auth with [facility] medicare for possible Cir admit pending payor approval.  Heron Leavell, RN, MSN Rehab Admissions Coordinator [phone] 07/29/2024 5:18 PM

## 2024-07-30 DIAGNOSIS — M4802 Spinal stenosis, cervical region: Secondary | ICD-10-CM | POA: Diagnosis not present

## 2024-07-30 DIAGNOSIS — M62838 Other muscle spasm: Secondary | ICD-10-CM | POA: Diagnosis not present

## 2024-07-30 DIAGNOSIS — G809 Cerebral palsy, unspecified: Secondary | ICD-10-CM | POA: Diagnosis not present

## 2024-07-30 DIAGNOSIS — B029 Zoster without complications: Secondary | ICD-10-CM | POA: Diagnosis not present

## 2024-07-30 LAB — BASIC METABOLIC PANEL WITH GFR
Anion gap: 12 (ref 5–15)
BUN: 34 mg/dL — ABNORMAL HIGH (ref 8–23)
CO2: 18 mmol/L — ABNORMAL LOW (ref 22–32)
Calcium: 8.3 mg/dL — ABNORMAL LOW (ref 8.9–10.3)
Chloride: 106 mmol/L (ref 98–111)
Creatinine, Ser: 0.89 mg/dL (ref 0.44–1.00)
GFR, Estimated: >60 mL/min (ref >60)
Glucose, Bld: 97 mg/dL (ref 70–99)
Potassium: 3.9 mmol/L (ref 3.5–5.1)
Sodium: 136 mmol/L (ref 135–145)

## 2024-07-30 LAB — MAGNESIUM: Magnesium: 2 mg/dL (ref 1.7–2.4)

## 2024-07-30 NOTE — Progress Notes (Addendum)
 HD#4 SUBJECTIVE:  Patient Summary: Kathy Owens is a 68 y.o. with a pertinent PMH of cerebral palsy and recently treated herpes zoster (Valtrex) who presented with concerns of increased spasticity and weakness and admitted for further workup for weakness.  Overnight Events: None  Interim History: Kathy Owens feels tired today. She had been able to sleep but feels that she needs more. She does not note any change with regards to her weakness. She states that she feels thirsty all the time. She notes that she has felt more forgetful since starting the Dantrolene.  OBJECTIVE:  Vital Signs: Vitals:   07/29/24 2006 07/30/24 0007 07/30/24 0413 07/30/24 0757  BP: 129/63 134/67 125/68 129/79  Pulse: 77 76 73 69  Resp: 17 18 18 16   Temp: (!) 97.5 F (36.4 C) 98.2 F (36.8 C) 98 F (36.7 C) 98.5 F (36.9 C)  TempSrc:      SpO2: 99% 100% 96%   Weight:      Height:       Supplemental O2: Room Air SpO2: 96 % O2 Flow Rate (L/min): 0 L/min  Filed Weights   07/24/24 0315  Weight: 67.3 kg     Intake/Output Summary (Last 24 hours) at 07/30/2024 1039 Last data filed at 07/30/2024 0424 Gross per 24 hour  Intake --  Output 850 ml  Net -850 ml   Net IO Since Admission: -2,515 mL [07/30/24 1039]  Physical Exam: Physical Exam Constitutional:      General: She is not in acute distress.    Appearance: She is not ill-appearing.  HENT:     Mouth/Throat:     Mouth: Mucous membranes are dry.  Eyes:     Extraocular Movements: Extraocular movements intact.  Cardiovascular:     Rate and Rhythm: Normal rate and regular rhythm.  Pulmonary:     Effort: Pulmonary effort is normal. No respiratory distress.     Breath sounds: No wheezing or rales.  Abdominal:     General: Abdomen is flat. Bowel sounds are normal. There is no distension.     Tenderness: There is no abdominal tenderness.  Musculoskeletal:     Right lower leg: No edema.     Left lower leg: No edema.     Comments:  RUE strength 4/5 LUE strength 3/5 BLE strength 3/5 Mild spasticity present  Skin:    Comments: Healing lesions over V1 distribution on the left  Neurological:     Mental Status: She is alert.      Patient Lines/Drains/Airways Status     Active Line/Drains/Airways     Name Placement date Placement time Site Days   Peripheral IV 07/23/24 20 G 1 Right Antecubital 07/23/24  2320  Antecubital  7   Wound 07/24/24 0800 Other (Comment) Face Left;Upper 07/24/24  0800  Face  6            Pertinent labs and imaging:      Latest Ref Rng & Units 07/24/2024    4:27 AM 07/23/2024   11:21 PM 07/13/2024    8:16 PM  CBC  WBC 4.0 - 10.5 K/uL 9.2  8.6  4.6   Hemoglobin 12.0 - 15.0 g/dL 86.1  86.2  86.3   Hematocrit 36.0 - 46.0 % 40.9  41.0  40.9   Platelets 150 - 400 K/uL 249  309  214        Latest Ref Rng & Units 07/30/2024    5:14 AM 07/29/2024    5:04 AM  07/28/2024    3:37 AM  CMP  Glucose 70 - 99 mg/dL 97  96  99   BUN 8 - 23 mg/dL 34  38  37   Creatinine 0.44 - 1.00 mg/dL 9.10  9.05  9.00   Sodium 135 - 145 mmol/L 136  135  133   Potassium 3.5 - 5.1 mmol/L 3.9  3.5  3.5   Chloride 98 - 111 mmol/L 106  104  103   CO2 22 - 32 mmol/L 18  20  21    Calcium  8.9 - 10.3 mg/dL 8.3  8.1  8.4     No results found.  ASSESSMENT/PLAN:  Assessment: Principal Problem:   Muscle spasticity Active Problems:   Cerebral palsy (HCC)   Chronic pain due to cerebral palsy   Chronic diarrhea of unknown origin   Impaired mobility   History of progressive weakness   Zoster, V1 distribution without ocular involvement   Rapidly progressive weakness   Spinal stenosis in cervical region  Kathy Owens is a 67 y.o. with a pertinent PMH of cerebral palsy and recently treated herpes zoster (Valtrex) who presented with concerns of increased spasticity and weakness and admitted for further workup for weakness.  Plan: #Generalized Muscle Spasticity  #Cerebral Palsy Patient reporting increased  stiffness causing weakness since April 2025.  Dr. Cornelio evaluated for CIR admission 10/7, and believes patient to be appropriate candidate for inpatient rehab for strengthening and a power wheelchair evaluation. However, there is an unclear reason for her upper motor neuron spasticity. Patient's tone in musculature was mild and she was able to ambulate short distances with minimal DME (only RW) in April.  Neurology evaluated patient and is recommending checking ionized calcium  and phosphorus, as well as trialing other medications like Clonazepam, Methocarbamol , or outpatient Botox or baclofen  pump. Ordered MRI Lumbar spine, showing broad disc bulge with ligamentum flavum thickening, resulting in severe canal stenosis at L1-L2, and lateral far right disc protrusion closely approximating the exiting right nerve. Mild canal stenosis from L2-L5. Neurology signing off, appreciate their recommendations. Consulted NSGY, and the patient is uninterested in surgery, and neurosurgery did not believe that this would cause the increased spasticity. Phosphorus normal. She has noticed memory issues while on the dantrolene. - Continue Flexeril  for spasms  - Dantrolene 25mg  BID per PM&R, will reach out to PM&R about their thoughts about continued Dantrolene. - Ionized calcium  pending - Continue to monitor BMP, Mag - Pending CIR placement   #Impaired Mobility #History of Progressive weakness  Seems that patient has not been able to make multiple visits to PM&R clinic in person due to weakness and mobility issues with contractures starting beginning of this year being noted. Reportedly worsened when patient was on increased doses of baclofen  and dantrolene. MRI Thoracic spine without any pathology. MRI Lumbar spine showing severe canal stenosis at L1-L2. Patient could benefit from outpatient neurology follow up for possible Botox injections with Dr. Asberry Tat.    #Zoster, V1 distribution without ocular involvement,  resolved Diagnosed 9/27 and was started on Valtrex 1000mg  BID for renal dosing. On day 14/14. Ophthalmology saw patient in ED on 9/27 and ruled out any ocular involvement but did note elevated intraocular pressure that will need to be followed up outpatient. Continue eye drops for any irritation caused by crust falling into eyes, and Eucerin cream for dryness of skin. Healing well.  - Valacyclovir 1000 mg BID (last day today) - Eucerin cream, eye drops   #Hypokalemia, resolved #Hypomagnesemia, resolved  POA with potassium of 3.3 and magnesium of 1.6. Patient has history of chronic diarrhea treated with imodium  which could be contributing. Will replenish electrolytes as appropriate.  - BMP, Mag daily - Continue loperamide  2mg  daily   #Chronic pain due to cerebral palsy  On home hydrocodone  - acetaminophen  5-235 mg tablet. Will continue inpatient, although patient has not required any PRN pain medications   #hyperlipidemia  Continue Lipitor 10 mg.  Best Practice: Diet: Regular diet VTE: enoxaparin  (LOVENOX ) injection 40 mg Start: 07/24/24 1000 SCDs Start: 07/24/24 0200 Code: Full  Disposition planning: Therapy Recs: CIR,  DISPO: Anticipated discharge to CIR pending authorization.  Signature:  Melvenia [facility]  10:39 AM, 07/30/2024  On Call pager [phone]

## 2024-07-30 NOTE — Plan of Care (Signed)

## 2024-07-30 NOTE — Plan of Care (Signed)
?  Problem: Education: ?Goal: Knowledge of General Education information will improve ?Description: Including pain rating scale, medication(s)/side effects and non-pharmacologic comfort measures ?Outcome: Progressing ?  ?Problem: Clinical Measurements: ?Goal: Ability to maintain clinical measurements within normal limits will improve ?Outcome: Progressing ?Goal: Will remain free from infection ?Outcome: Progressing ?Goal: Diagnostic test results will improve ?Outcome: Progressing ?Goal: Respiratory complications will improve ?Outcome: Progressing ?Goal: Cardiovascular complication will be avoided ?Outcome: Progressing ?  ?Problem: Nutrition: ?Goal: Adequate nutrition will be maintained ?Outcome: Progressing ?  ?Problem: Activity: ?Goal: Risk for activity intolerance will decrease ?Outcome: Progressing ?  ?

## 2024-07-31 DIAGNOSIS — M62838 Other muscle spasm: Secondary | ICD-10-CM | POA: Diagnosis not present

## 2024-07-31 DIAGNOSIS — B029 Zoster without complications: Secondary | ICD-10-CM | POA: Diagnosis not present

## 2024-07-31 DIAGNOSIS — M4802 Spinal stenosis, cervical region: Secondary | ICD-10-CM | POA: Diagnosis not present

## 2024-07-31 DIAGNOSIS — G809 Cerebral palsy, unspecified: Secondary | ICD-10-CM | POA: Diagnosis not present

## 2024-07-31 LAB — BASIC METABOLIC PANEL WITH GFR
Anion gap: 14 (ref 5–15)
BUN: 28 mg/dL — ABNORMAL HIGH (ref 8–23)
CO2: 22 mmol/L (ref 22–32)
Calcium: 8.5 mg/dL — ABNORMAL LOW (ref 8.9–10.3)
Chloride: 104 mmol/L (ref 98–111)
Creatinine, Ser: 0.76 mg/dL (ref 0.44–1.00)
GFR, Estimated: >60 mL/min (ref >60)
Glucose, Bld: 98 mg/dL (ref 70–99)
Potassium: 4.3 mmol/L (ref 3.5–5.1)
Sodium: 140 mmol/L (ref 135–145)

## 2024-07-31 LAB — MAGNESIUM: Magnesium: 1.9 mg/dL (ref 1.7–2.4)

## 2024-07-31 NOTE — Progress Notes (Addendum)
 HD#5 SUBJECTIVE:  Patient Summary: Kathy Owens is a 68 y.o. with a pertinent PMH of cerebral palsy and recently treated herpes zoster (Valtrex) who presented with concerns of increased spasticity and weakness and admitted for further workup for weakness.  Overnight Events: None  Interim History: She notes that she feels improved from a weakness and spasticity standpoint. She endorses continued pain and itchiness over her left forehead and eye. She denies chest pain and shortness of breath. She has been able to eat without difficulty.  OBJECTIVE:  Vital Signs: Vitals:   07/30/24 2008 07/31/24 0059 07/31/24 0514 07/31/24 0754  BP: (!) 151/70 137/74 122/70 126/72  Pulse: 80 79 74 71  Resp: 18 18 18 19   Temp: 97.7 F (36.5 C) 97.8 F (36.6 C) 98.1 F (36.7 C) 97.8 F (36.6 C)  TempSrc:      SpO2: 100% 97% 96% 96%  Weight:      Height:       Supplemental O2: Room Air   Filed Weights   07/24/24 0315  Weight: 67.3 kg     Intake/Output Summary (Last 24 hours) at 07/31/2024 0959 Last data filed at 07/30/2024 1600 Gross per 24 hour  Intake --  Output 450 ml  Net -450 ml   Net IO Since Admission: -2,725 mL [07/31/24 0959]  Physical Exam: Physical Exam Constitutional:      General: She is not in acute distress.    Appearance: She is not ill-appearing.  HENT:     Mouth/Throat:     Mouth: Mucous membranes are dry.  Eyes:     Extraocular Movements: Extraocular movements intact.  Cardiovascular:     Rate and Rhythm: Normal rate and regular rhythm.  Pulmonary:     Effort: Pulmonary effort is normal. No respiratory distress.     Breath sounds: No wheezing or rales.  Abdominal:     General: Abdomen is flat. Bowel sounds are normal. There is no distension.     Tenderness: There is no abdominal tenderness.  Musculoskeletal:     Right lower leg: No edema.     Left lower leg: No edema.     Comments: RUE strength 4+/5 LUE strength 4/5 BLE strength 3/5 Mild  spasticity present  Skin:    Comments: Healing lesions over V1 distribution on the left  Neurological:     Mental Status: She is alert.      Patient Lines/Drains/Airways Status     Active Line/Drains/Airways     Name Placement date Placement time Site Days   Peripheral IV 07/23/24 20 G 1 Right Antecubital 07/23/24  2320  Antecubital  7   Wound 07/24/24 0800 Other (Comment) Face Left;Upper 07/24/24  0800  Face  6            Pertinent labs and imaging:      Latest Ref Rng & Units 07/24/2024    4:27 AM 07/23/2024   11:21 PM 07/13/2024    8:16 PM  CBC  WBC 4.0 - 10.5 K/uL 9.2  8.6  4.6   Hemoglobin 12.0 - 15.0 g/dL 86.1  86.2  86.3   Hematocrit 36.0 - 46.0 % 40.9  41.0  40.9   Platelets 150 - 400 K/uL 249  309  214        Latest Ref Rng & Units 07/31/2024    6:47 AM 07/30/2024    5:14 AM 07/29/2024    5:04 AM  CMP  Glucose 70 - 99 mg/dL 98  97  96   BUN 8 - 23 mg/dL 28  34  38   Creatinine 0.44 - 1.00 mg/dL 9.23  9.10  9.05   Sodium 135 - 145 mmol/L 140  136  135   Potassium 3.5 - 5.1 mmol/L 4.3  3.9  3.5   Chloride 98 - 111 mmol/L 104  106  104   CO2 22 - 32 mmol/L 22  18  20    Calcium  8.9 - 10.3 mg/dL 8.5  8.3  8.1     No results found.  ASSESSMENT/PLAN:  Assessment: Principal Problem:   Muscle spasticity Active Problems:   Cerebral palsy (HCC)   Chronic pain due to cerebral palsy   Chronic diarrhea of unknown origin   Impaired mobility   History of progressive weakness   Zoster, V1 distribution without ocular involvement   Rapidly progressive weakness   Spinal stenosis in cervical region  Kathy Owens is a 68 y.o. with a pertinent PMH of cerebral palsy and recently treated herpes zoster (Valtrex) who presented with concerns of increased spasticity and weakness and admitted for further workup for weakness.  Plan: #Generalized Muscle Spasticity  #Cerebral Palsy  Patient reporting increased stiffness causing weakness since April 2025.  Dr. Cornelio  evaluated for CIR admission 10/7, and believes patient to be appropriate candidate for inpatient rehab for strengthening and a power wheelchair evaluation. However, there is an unclear reason for her upper motor neuron spasticity. Patient's tone in musculature was mild and she was able to ambulate short distances with minimal DME (only RW) in April.  Neurology evaluated patient and is recommending checking ionized calcium  and phosphorus, as well as trialing other medications like Clonazepam, Methocarbamol , or outpatient Botox or baclofen  pump. Ordered MRI Lumbar spine, showing broad disc bulge with ligamentum flavum thickening, resulting in severe canal stenosis at L1-L2, and lateral far right disc protrusion closely approximating the exiting right nerve. Mild canal stenosis from L2-L5. Neurology signing off, appreciate their recommendations. Consulted NSGY, and the patient is uninterested in surgery, and neurosurgery did not believe that her stenosis would cause the increased spasticity. Phosphorus normal. Today, she feels improved from a spasticity perspective as well as her weakness. - Continue Flexeril  for spasms  - Dantrolene 25mg  BID per PM&R  - Ionized calcium  pending - Continue to monitor BMP, Mag - Pending CIR placement - Daily PT/OT   #Impaired Mobility #History of Progressive weakness   Patient had not been able to make multiple visits to PM&R clinic in person due to weakness and mobility issues with contractures starting beginning of this year being noted. Reportedly worsened when patient was on increased doses of baclofen  and dantrolene. MRI Thoracic spine without any pathology. MRI Lumbar spine showing severe canal stenosis at L1-L2. Patient could benefit from outpatient neurology follow up for possible Botox injections with Dr. Asberry Tat.   -Daily PT/OT   #Zoster, V1 distribution without ocular involvement, resolved  Diagnosed 9/27 and finished a 14 day course of Valtrex 1000mg  BID  for 14 days. Ophthalmology saw patient in ED on 9/27 and ruled out any ocular involvement but did note elevated intraocular pressure that will need to be followed up outpatient. Continue eye drops for any irritation caused by crust falling into eyes, and Eucerin cream for dryness of skin. Healing well.  - Eucerin cream, eye drops   #Hypokalemia, resolved #Hypomagnesemia, resolved  POA with potassium of 3.3 and magnesium of 1.6. Patient has history of chronic diarrhea treated with imodium  which could be contributing. Will  replenish electrolytes as appropriate.  - BMP, Mag daily - Continue loperamide  2mg  daily   #Chronic pain due to cerebral palsy   On home hydrocodone  - acetaminophen  5-235 mg tablet. Will continue inpatient, although patient has not required any PRN pain medications   #Hyperlipidemia   Continue Lipitor 10 mg.  Best Practice: Diet: Regular diet VTE: enoxaparin  (LOVENOX ) injection 40 mg Start: 07/24/24 1000 SCDs Start: 07/24/24 0200 Code: Full  Disposition planning: Therapy Recs: CIR,  DISPO: Anticipated discharge to CIR pending authorization.  Signature:  Melvenia Napoleon [facility]  9:59 AM, 07/31/2024  On Call pager [phone]   Internal Medicine Attending:   I personally saw and examined the patient. Discussed with the resident and agree with the resident's findings and plan of care as documented in the resident's note. C/o prev rash on her head and forehead. Also c/o L eye discomfort.  She has no new lesions on exam.  Await completion of CIR eval.  Eben Reyes BROCKS, MD
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
## 2024-08-01 DIAGNOSIS — Z8619 Personal history of other infectious and parasitic diseases: Secondary | ICD-10-CM | POA: Diagnosis not present

## 2024-08-01 DIAGNOSIS — M62838 Other muscle spasm: Secondary | ICD-10-CM | POA: Diagnosis not present

## 2024-08-01 DIAGNOSIS — G809 Cerebral palsy, unspecified: Secondary | ICD-10-CM | POA: Diagnosis not present

## 2024-08-01 LAB — CALCIUM, IONIZED: Calcium, Ionized, Serum: 4.8 mg/dL (ref 4.5–5.6)

## 2024-08-01 LAB — BASIC METABOLIC PANEL WITH GFR
Anion gap: 12 (ref 5–15)
BUN: 29 mg/dL — ABNORMAL HIGH (ref 8–23)
CO2: 19 mmol/L — ABNORMAL LOW (ref 22–32)
Calcium: 8.5 mg/dL — ABNORMAL LOW (ref 8.9–10.3)
Chloride: 104 mmol/L (ref 98–111)
Creatinine, Ser: 0.89 mg/dL (ref 0.44–1.00)
GFR, Estimated: >60 mL/min (ref >60)
Glucose, Bld: 93 mg/dL (ref 70–99)
Potassium: 3.7 mmol/L (ref 3.5–5.1)
Sodium: 135 mmol/L (ref 135–145)

## 2024-08-01 LAB — MAGNESIUM: Magnesium: 1.8 mg/dL (ref 1.7–2.4)

## 2024-08-01 MED ORDER — RIVAROXABAN 10 MG PO TABS
10.0000 mg | ORAL_TABLET | Freq: Every day | ORAL | Status: DC
  Administered 2024-08-02 – 2024-08-04 (×3): 10 mg via ORAL
  Filled 2024-08-01 (×3): qty 1

## 2024-08-01 MED ORDER — MAGNESIUM SULFATE 2 GM/50ML IV SOLN
2.0000 g | Freq: Once | INTRAVENOUS | Status: AC
  Administered 2024-08-01: 2 g via INTRAVENOUS
  Filled 2024-08-01: qty 50

## 2024-08-01 MED ORDER — POLYVINYL ALCOHOL 1.4 % OP SOLN
1.0000 [drp] | OPHTHALMIC | Status: DC | PRN
  Administered 2024-08-02 – 2024-08-04 (×3): 1 [drp] via OPHTHALMIC
  Filled 2024-08-01: qty 15

## 2024-08-01 NOTE — Plan of Care (Signed)

## 2024-08-01 NOTE — Treatment Plan (Signed)
 Occupational Therapy Treatment Patient Details Name: Kathy Owens MRN: [id number] DOB: 11/27/1955 Today's Date: 08/01/2024   History of present illness 68 y/o F presenting to ED on 10/4 with incr spasticity x2 weeks. PMH includes cerebral palsy, herpes zoster on valtrex, HTN, anterior ischemic bilateral optic neuropathy, vertigo, HLD, osteoporosis   OT comments  Pt completed bed mobility from L side this session with increased BLE activation toward EOB compared to prior session with OT. Pt benefits from stedy with two person (A) back to bed. Pt with great motivation to participate this session. Recommendation remains appropriate for intensive inpatient follow-up therapy, >3 hours/day         Call me [name]    If plan is discharge home, recommend the following:  Two people to help with walking and/or transfers;A lot of help with bathing/dressing/bathroom;Assistance with cooking/housework;Assistance with feeding;Direct supervision/assist for medications management;Direct supervision/assist for financial management;Assist for transportation;Help with stairs or ramp for entrance   Equipment Recommendations  BSC/3in1;Other (comment) (defer to AIR - BSC and w/c with cushion)    Recommendations for Other Services Rehab consult;PT consult    Precautions / Restrictions Precautions Precautions: Fall Precaution/Restrictions Comments: hx of CP with spasticity Restrictions Weight Bearing Restrictions Per Provider Order: No       Mobility Bed Mobility Overal bed mobility: Needs Assistance Bed Mobility: Supine to Sit Rolling: Max assist, Used rails   Supine to sit: Max assist, Used rails, HOB elevated     General bed mobility comments: pt dependent on staff to help use pad to scoot hips toward eob. pt is able to get LLE off eob and nearly the R LE showing some decreased spasticity with task. pt need (A) to rotate trunk with pad and elevate trunk from surface. pt pushing / pulling toward the  surface due to fear of falling. pt static sitting with heavy UE use and mod (A) at times.    Transfers Overall transfer level: Needs assistance Equipment used: Rolling walker (2 wheels), Ambulation equipment used Transfers: Sit to/from Stand, Bed to chair/wheelchair/BSC Sit to Stand: From elevated surface, Via lift equipment, +2 physical assistance, Max assist Stand pivot transfers: +2 physical assistance, +2 safety/equipment, Max assist         General transfer comment: pt with heavy use of RW. pt reports some dizziness with standing and fear of falling. pt needs (A) to anterior shift to static standing. pt with decreased transfer with RW compared to prior session with OT. pt benefits from weight shift (A) from therapy. pt sitting prematurely and completed stand pivot with total +2 max (A) pad used to pull back into the chair surface     Balance Overall balance assessment: Needs assistance Sitting-balance support: Bilateral upper extremity supported, Feet supported Sitting balance-Leahy Scale: Poor     Standing balance support: Bilateral upper extremity supported, During functional activity, Reliant on assistive device for balance Standing balance-Leahy Scale: Poor                             ADL either performed or assessed with clinical judgement   ADL Overall ADL's : Needs assistance/impaired Eating/Feeding: Set up;Sitting Eating/Feeding Details (indicate cue type and reason): drinking water Grooming: Minimal assistance;Sitting   Upper Body Bathing: Moderate assistance   Lower Body Bathing: Maximal assistance           Toilet Transfer: +2 for physical assistance;Maximal assistance Toilet Transfer Details (indicate cue type and reason): needed (A) to pivot  to surface and pt sits prematurely   Toileting - Clothing Manipulation Details (indicate cue type and reason): pt incontinence of stool without awareness. pt with strong amonia odor with dark urine in room.  pt encouraged to drink water.       General ADL Comments: pt upright in chair at this time. recommendation for two person stedy back to bed    Extremity/Trunk Assessment Upper Extremity Assessment Upper Extremity Assessment: Overall WFL for tasks assessed   Lower Extremity Assessment Lower Extremity Assessment: Defer to PT evaluation        Vision   Additional Comments: L eye with edema at the eye lid and rash almost completely resolved above the eye   Perception Perception Perception: Not tested   Praxis Praxis Praxis: Not tested   Communication Communication Communication: Impaired Factors Affecting Communication: Hearing impaired;Reduced clarity of speech   Cognition Arousal: Alert Behavior During Therapy: [facility] for tasks assessed/performed Cognition: No family/caregiver present to determine baseline, Cognition impaired             OT - Cognition Comments: pt asking repetitive questions. pt unaware that PT answered question and asking again. pt needed cues to alternate to the next person in the room                 Following commands: Intact        Cueing   Cueing Techniques: Verbal cues, Tactile cues  Exercises      Shoulder Instructions       General Comments HR 100 RR 95 RA does report mild dizziness that resolves with sitting    Pertinent Vitals/ Pain       Pain Assessment Pain Assessment: No/denies pain  Home Living                                          Prior Functioning/Environment              Frequency  Min 2X/week        Progress Toward Goals  OT Goals(current goals can now be found in the care plan section)  Progress towards OT goals: Progressing toward goals  Acute Rehab OT Goals Patient Stated Goal: to see therapy more OT Goal Formulation: With patient Time For Goal Achievement: 08/07/24 Potential to Achieve Goals: Good ADL Goals Pt Will Perform Grooming: with contact guard  assist;sitting Pt Will Perform Upper Body Dressing: with contact guard assist;sitting Pt Will Perform Lower Body Dressing: sit to/from stand;sitting/lateral leans;with min assist Pt Will Transfer to Toilet: with min assist;bedside commode;squat pivot transfer;stand pivot transfer Additional ADL Goal #1: pt will demo good sitting balance in prep for seated ADLs  Plan      Co-evaluation    PT/OT/SLP Co-Evaluation/Treatment: Yes Reason for Co-Treatment: Complexity of the patient's impairments (multi-system involvement);For patient/therapist safety;Necessary to address cognition/behavior during functional activity   OT goals addressed during session: ADL's and self-care      AM-PAC OT 6 Clicks Daily Activity     Outcome Measure   Help from another person eating meals?: A Little Help from another person taking care of personal grooming?: A Little Help from another person toileting, which includes using toliet, bedpan, or urinal?: A Lot Help from another person bathing (including washing, rinsing, drying)?: A Lot Help from another person to put on and taking off regular upper body clothing?: A Lot Help from another person  to put on and taking off regular lower body clothing?: A Lot 6 Click Score: 14    End of Session Equipment Utilized During Treatment: Rolling walker (2 wheels);Gait belt  OT Visit Diagnosis: Unsteadiness on feet (R26.81);Other abnormalities of gait and mobility (R26.89);Muscle weakness (generalized) (M62.81)   Activity Tolerance Patient tolerated treatment well   Patient Left in chair;with call bell/phone within reach;with chair alarm set   Nurse Communication Mobility status;Precautions        Time: 8660-8597 OT Time Calculation (min): 23 min  Charges: OT General Charges $OT Visit: 1 Visit OT Treatments $Self Care/Home Management : 8-22 mins   Brynn, OTR/L  Acute Rehabilitation Services Office: [phone] .   Ely Molt 08/01/2024, 2:18  PM

## 2024-08-01 NOTE — Progress Notes (Signed)
 Physical Therapy Treatment Patient Details Name: Kathy Owens MRN: [id number] DOB: Jan 21, 1956 Today's Date: 08/01/2024   History of Present Illness 68 y/o F presenting to ED on 10/4 with incr spasticity x2 weeks. PMH includes cerebral palsy, herpes zoster on valtrex, HTN, anterior ischemic bilateral optic neuropathy, vertigo, HLD, osteoporosis    PT Comments  Patient progressing with mobility since reporting feels legs are less stiff.  She was able to bring legs off EOB with min A and able to take a few steps sliding feet forward when A for lateral weight shifts.  She was able to stand easier from EOB and chair today as well.  Feel she will benefit from inpatient rehab (>3 hours/day) to be able to return home.    If plan is discharge home, recommend the following: A lot of help with walking and/or transfers;A lot of help with bathing/dressing/bathroom;Assistance with cooking/housework;Assist for transportation;Help with stairs or ramp for entrance   Can travel by private vehicle        Equipment Recommendations  Other (comment) (TBA)    Recommendations for Other Services       Precautions / Restrictions Precautions Precautions: Fall Precaution/Restrictions Comments: hx of CP with spasticity     Mobility  Bed Mobility Overal bed mobility: Needs Assistance Bed Mobility: Supine to Sit Rolling: Max assist, Used rails   Supine to sit: Max assist, Used rails, HOB elevated     General bed mobility comments: pt dependent on staff to help use pad to scoot hips toward eob. pt is able to get LLE off eob and nearly the R LE showing some decreased spasticity with task. pt need (A) to rotate trunk with pad and elevate trunk from surface. pt pushing / pulling toward the surface due to fear of falling. pt static sitting with heavy UE use and mod (A) at times.    Transfers Overall transfer level: Needs assistance   Transfers: Sit to/from Stand, Bed to chair/wheelchair/BSC   Stand  pivot transfers: +2 physical assistance, +2 safety/equipment, Max assist         General transfer comment: pt with heavy use of RW. pt reports some dizziness with standing and fear of falling. pt needs (A) to anterior shift to static standing. pt with decreased transfer with RW compared to prior session with OT. pt benefits from weight shift (A) from therapy. pt sitting prematurely and completed stand pivot with total +2 max (A) pad used to pull back into the chair surface    Ambulation/Gait Ambulation/Gait assistance: Mod assist, +2 safety/equipment Gait Distance (Feet): 1 Feet Assistive device: Rolling walker (2 wheels)         General Gait Details: able to step forward with several steps though not able to turn due to c/o feet fatigued so placed chair as noted above, assist to weight shift to allow pt to slide feet forward   Stairs             Wheelchair Mobility     Tilt Bed    Modified Rankin (Stroke Patients Only)       Balance Overall balance assessment: Needs assistance Sitting-balance support: Bilateral upper extremity supported, Feet supported Sitting balance-Leahy Scale: Poor Sitting balance - Comments: UE support for balance on EOB   Standing balance support: Bilateral upper extremity supported, During functional activity, Reliant on assistive device for balance Standing balance-Leahy Scale: Poor Standing balance comment: B UE supported on rail for Stedy and min A  Communication Communication Communication: Impaired Factors Affecting Communication: Hearing impaired;Reduced clarity of speech  Cognition Arousal: Alert Behavior During Therapy: WFL for tasks assessed/performed                             Following commands: Intact      Cueing Cueing Techniques: Verbal cues  Exercises      General Comments General comments (skin integrity, edema, etc.): HR 100, SpO2 95% on RA, unable to obtain BP  (pt related dizziness) due to cuff popping off x 2; standing for hygiene as pt soiled with urine and small BM, staff aware needing new purewick      Pertinent Vitals/Pain Pain Assessment Pain Assessment: No/denies pain    Home Living                          Prior Function            PT Goals (current goals can now be found in the care plan section) Progress towards PT goals: Progressing toward goals    Frequency    Min 2X/week      PT Plan      Co-evaluation PT/OT/SLP Co-Evaluation/Treatment: Yes Reason for Co-Treatment: Complexity of the patient's impairments (multi-system involvement);For patient/therapist safety;Necessary to address cognition/behavior during functional activity PT goals addressed during session: Mobility/safety with mobility;Balance OT goals addressed during session: ADL's and self-care      AM-PAC PT 6 Clicks Mobility   Outcome Measure  Help needed turning from your back to your side while in a flat bed without using bedrails?: A Lot Help needed moving from lying on your back to sitting on the side of a flat bed without using bedrails?: Total   Help needed standing up from a chair using your arms (e.g., wheelchair or bedside chair)?: Total Help needed to walk in hospital room?: Total Help needed climbing 3-5 steps with a railing? : Total 6 Click Score: 6    End of Session Equipment Utilized During Treatment: Gait belt Activity Tolerance: Patient limited by fatigue Patient left: in chair;with call bell/phone within reach;with chair alarm set   PT Visit Diagnosis: Unsteadiness on feet (R26.81);Other abnormalities of gait and mobility (R26.89);Muscle weakness (generalized) (M62.81)     Time: 8662-8597 PT Time Calculation (min) (ACUTE ONLY): 25 min  Charges:    $Therapeutic Activity: 8-22 mins PT General Charges $$ ACUTE PT VISIT: 1 Visit                     Micheline Owens, PT Acute Rehabilitation  Services Office:[phone] 08/01/2024    Kathy Owens 08/01/2024, 2:27 PM

## 2024-08-01 NOTE — Progress Notes (Addendum)
 HD#6 SUBJECTIVE:  Patient Summary: Kathy Owens is a 68 y.o. with a pertinent PMH of cerebral palsy and recently treated herpes zoster (Valtrex) who presented with concerns of increased spasticity and weakness and admitted for further workup for weakness, which is improving.   Overnight Events: None  Interim History: She notes that she feels improved from a weakness and spasticity standpoint. No changes in her vision or any associated eye pain. She has no complaints this morning and is tolerating her breakfast well today.   OBJECTIVE:  Vital Signs: Vitals:   07/31/24 2152 08/01/24 0116 08/01/24 0541 08/01/24 0743  BP: 124/70 127/61 115/62 118/69  Pulse: 78 80 71 66  Resp: 18 17  16   Temp: 98.3 F (36.8 C) 97.9 F (36.6 C) 98.4 F (36.9 C) 98.5 F (36.9 C)  TempSrc: Oral Oral  Oral  SpO2: 96% 95% 97% 97%  Weight:      Height:       Supplemental O2: Room Air   Filed Weights   07/24/24 0315  Weight: 67.3 kg     Intake/Output Summary (Last 24 hours) at 08/01/2024 9062 Last data filed at 07/31/2024 1300 Gross per 24 hour  Intake 118 ml  Output --  Net 118 ml   Net IO Since Admission: -2,607 mL [08/01/24 0937]  Physical Exam: Physical Exam Constitutional:      General: She is not in acute distress.    Appearance: She is not ill-appearing.  HENT:     Mouth/Throat:     Mouth: Mucous membranes are dry.  Eyes:     Extraocular Movements: Extraocular movements intact.  Cardiovascular:     Rate and Rhythm: Normal rate and regular rhythm.  Pulmonary:     Effort: Pulmonary effort is normal. No respiratory distress.     Breath sounds: No wheezing or rales.  Abdominal:     General: Abdomen is flat. Bowel sounds are normal. There is no distension.     Tenderness: There is no abdominal tenderness.  Musculoskeletal:     Right lower leg: No edema.     Left lower leg: No edema.     Comments: RUE strength 4+/5 LUE strength 4/5 BLE strength 3/5 Mild spasticity  present  Skin:    Comments: Healing lesions over V1 distribution on the left  Neurological:     Mental Status: She is alert.      Patient Lines/Drains/Airways Status     Active Line/Drains/Airways     Name Placement date Placement time Site Days   Peripheral IV 07/23/24 20 G 1 Right Antecubital 07/23/24  2320  Antecubital  7   Wound 07/24/24 0800 Other (Comment) Face Left;Upper 07/24/24  0800  Face  6            Pertinent labs and imaging:      Latest Ref Rng & Units 07/24/2024    4:27 AM 07/23/2024   11:21 PM 07/13/2024    8:16 PM  CBC  WBC 4.0 - 10.5 K/uL 9.2  8.6  4.6   Hemoglobin 12.0 - 15.0 g/dL 86.1  86.2  86.3   Hematocrit 36.0 - 46.0 % 40.9  41.0  40.9   Platelets 150 - 400 K/uL 249  309  214        Latest Ref Rng & Units 08/01/2024    4:41 AM 07/31/2024    6:47 AM 07/30/2024    5:14 AM  CMP  Glucose 70 - 99 mg/dL 93  98  97  BUN 8 - 23 mg/dL 29  28  34   Creatinine 0.44 - 1.00 mg/dL 9.10  9.23  9.10   Sodium 135 - 145 mmol/L 135  140  136   Potassium 3.5 - 5.1 mmol/L 3.7  4.3  3.9   Chloride 98 - 111 mmol/L 104  104  106   CO2 22 - 32 mmol/L 19  22  18    Calcium  8.9 - 10.3 mg/dL 8.5  8.5  8.3     No results found.  ASSESSMENT/PLAN:  Assessment: Principal Problem:   Muscle spasticity Active Problems:   Cerebral palsy (HCC)   Chronic pain due to cerebral palsy   Chronic diarrhea of unknown origin   Impaired mobility   History of progressive weakness   Zoster, V1 distribution without ocular involvement   Rapidly progressive weakness   Spinal stenosis in cervical region  Kathy Owens is a 68 y.o. with a pertinent PMH of cerebral palsy and recently treated herpes zoster (Valtrex) who presented with concerns of increased spasticity and weakness and admitted for further workup for weakness.  Plan: #Generalized Muscle Spasticity  #Cerebral Palsy  Patient reporting increased stiffness causing weakness since April 2025.  Dr. Cornelio evaluated  for CIR admission 10/7, and believes patient to be appropriate candidate for inpatient rehab for strengthening and a power wheelchair evaluation. However, there is an unclear reason for her upper motor neuron spasticity. Patient's tone in musculature was mild and she was able to ambulate short distances with minimal DME (only RW) in April.  MR lumbar spine showing severe canal stenosis at L1-L2. Patient is uninterested in surgery, and neurosurgery did not believe that her stenosis would cause the increased spasticity. Today, she feels improved from a spasticity perspective as well as her weakness. CIR to run insurance today and pend placement. - Continue Flexeril  for spasms  - Dantrolene 25mg  BID per PM&R  - Ionized calcium  pending - Continue to monitor BMP, Mag - Pending CIR placement - Daily PT/OT   #Impaired Mobility #History of Progressive weakness   Patient had not been able to make multiple visits to PM&R clinic in person due to weakness and mobility issues with contractures starting beginning of this year being noted. Reportedly worsened when patient was on increased doses of baclofen  and dantrolene. MRI Thoracic spine without any pathology. MRI Lumbar spine showing severe canal stenosis at L1-L2. Patient could benefit from outpatient neurology follow up for possible Botox injections with Dr. Asberry Tat.  -Daily PT/OT   #Zoster, V1 distribution without ocular involvement, resolved  Diagnosed 9/27 and finished a 14 day course of Valtrex 1000mg  BID on 10/11. Ophthalmology saw patient in ED on 9/27 and ruled out any ocular involvement but did note elevated intraocular pressure that will need to be followed up outpatient. Continue eye drops for any irritation caused by crust falling into eyes, and Eucerin cream for dryness of skin. Healing well.  - Eucerin cream, eye drops   #Hypokalemia, resolved #Hypomagnesemia, resolved  POA with potassium of 3.3 and magnesium of 1.6. Patient has history of  chronic diarrhea treated with imodium  which could be contributing. Will replenish electrolytes as appropriate.  - BMP, Mag daily - Continue loperamide  2mg  daily   #Chronic pain due to cerebral palsy   On home hydrocodone  - acetaminophen  5-235 mg tablet. Will continue inpatient, although patient has not required any PRN pain medications   #Hyperlipidemia   Continue Lipitor 10 mg.  Best Practice: Diet: Regular diet VTE: rivaroxaban (XARELTO)  tablet 10 mg Start: 08/02/24 1000 SCDs Start: 07/24/24 0200 Code: Full  Disposition planning: Therapy Recs: CIR,  DISPO: Anticipated discharge to CIR pending authorization.  Signature:  Arrington Bencomo, DO [facility]  9:37 AM, 08/01/2024  On Call pager [phone]

## 2024-08-01 NOTE — Plan of Care (Signed)

## 2024-08-02 DIAGNOSIS — G809 Cerebral palsy, unspecified: Secondary | ICD-10-CM | POA: Diagnosis not present

## 2024-08-02 DIAGNOSIS — M62838 Other muscle spasm: Secondary | ICD-10-CM | POA: Diagnosis not present

## 2024-08-02 LAB — BASIC METABOLIC PANEL WITH GFR
Anion gap: 12 (ref 5–15)
BUN: 31 mg/dL — ABNORMAL HIGH (ref 8–23)
CO2: 20 mmol/L — ABNORMAL LOW (ref 22–32)
Calcium: 8.3 mg/dL — ABNORMAL LOW (ref 8.9–10.3)
Chloride: 103 mmol/L (ref 98–111)
Creatinine, Ser: 0.78 mg/dL (ref 0.44–1.00)
GFR, Estimated: >60 mL/min (ref >60)
Glucose, Bld: 128 mg/dL — ABNORMAL HIGH (ref 70–99)
Potassium: 3.8 mmol/L (ref 3.5–5.1)
Sodium: 135 mmol/L (ref 135–145)

## 2024-08-02 LAB — MAGNESIUM: Magnesium: 2 mg/dL (ref 1.7–2.4)

## 2024-08-02 NOTE — Progress Notes (Signed)
   Inpatient Rehabilitation Admissions Coordinator   Insurance Auth for CIR was begun yesterday by my covering Admissions Coordinator, Kristyn Conetta. I await insurance approval.  Heron Leavell, RN, MSN Rehab Admissions Coordinator [phone] 08/02/2024 8:23 AM

## 2024-08-02 NOTE — Plan of Care (Signed)
  Problem: Activity: Goal: Risk for activity intolerance will decrease Outcome: Progressing   Problem: Coping: Goal: Level of anxiety will decrease Outcome: Progressing   Problem: Elimination: Goal: Will not experience complications related to bowel motility Outcome: Progressing Goal: Will not experience complications related to urinary retention Outcome: Progressing   Problem: Safety: Goal: Ability to remain free from injury will improve Outcome: Progressing   Problem: Skin Integrity: Goal: Risk for impaired skin integrity will decrease Outcome: Progressing

## 2024-08-02 NOTE — Plan of Care (Signed)

## 2024-08-02 NOTE — Progress Notes (Addendum)
   Inpatient Rehabilitation Admissions Coordinator   [facility] medicare MD has requested peer to peer with MD for discussions on the CIR admit request. I have notified Dr Cornelio of this request.  Heron Leavell, RN, MSN Rehab Admissions Coordinator [phone] 08/02/2024 3:40 PM   Dr Lovorn has received approval for CIR admit. I am hopeful for a bed in the next 24 to 48 hrs.  I met with patient at bedside and she is aware.  Heron Leavell, RN, MSN Rehab Admissions Coordinator [phone] 08/02/2024 4:49 PM

## 2024-08-02 NOTE — Progress Notes (Signed)
 HD#7 SUBJECTIVE:  Patient Summary: Kathy Owens is a 68 y.o. with a pertinent PMH of cerebral palsy and recently treated herpes zoster (Valtrex) who presented with concerns of increased spasticity and weakness and admitted for further workup for weakness, which is improving.   Overnight Events: None  Interim History: No changes today, has some blurriness in her eyes but this is not bothersome to her. She has no complaints this morning and is tolerating her breakfast well today.   OBJECTIVE:  Vital Signs: Vitals:   08/01/24 1150 08/01/24 1550 08/01/24 2004 08/02/24 0856  BP: 112/89 136/81 116/77 126/72  Pulse: 80 76 74 71  Resp: 16 16 17 18   Temp: 98.2 F (36.8 C) (!) 97.5 F (36.4 C) 98.5 F (36.9 C) 98.2 F (36.8 C)  TempSrc: Oral Oral Oral   SpO2: 97% 97% 97% 97%  Weight:      Height:       Supplemental O2: Room Air   Filed Weights   07/24/24 0315  Weight: 67.3 kg     Intake/Output Summary (Last 24 hours) at 08/02/2024 1021 Last data filed at 08/01/2024 1700 Gross per 24 hour  Intake 360 ml  Output --  Net 360 ml   Net IO Since Admission: -1,887 mL [08/02/24 1021]  Physical Exam: Physical Exam Constitutional:      General: She is not in acute distress.    Appearance: She is not ill-appearing.  HENT:     Mouth/Throat:     Mouth: Mucous membranes are dry.  Eyes:     Extraocular Movements: Extraocular movements intact.  Cardiovascular:     Rate and Rhythm: Normal rate and regular rhythm.  Pulmonary:     Effort: Pulmonary effort is normal. No respiratory distress.     Breath sounds: No wheezing or rales.  Abdominal:     General: Abdomen is flat. Bowel sounds are normal. There is no distension.     Tenderness: There is no abdominal tenderness.  Musculoskeletal:     Right lower leg: No edema.     Left lower leg: No edema.     Comments: RUE strength 4+/5 LUE strength 4/5 BLE strength 3/5 Mild spasticity present  Skin:    Comments: Healing lesions  over V1 distribution on the left  Neurological:     Mental Status: She is alert.      Patient Lines/Drains/Airways Status     Active Line/Drains/Airways     Name Placement date Placement time Site Days   Peripheral IV 07/23/24 20 G 1 Right Antecubital 07/23/24  2320  Antecubital  7   Wound 07/24/24 0800 Other (Comment) Face Left;Upper 07/24/24  0800  Face  6            Pertinent labs and imaging:      Latest Ref Rng & Units 07/24/2024    4:27 AM 07/23/2024   11:21 PM 07/13/2024    8:16 PM  CBC  WBC 4.0 - 10.5 K/uL 9.2  8.6  4.6   Hemoglobin 12.0 - 15.0 g/dL 86.1  86.2  86.3   Hematocrit 36.0 - 46.0 % 40.9  41.0  40.9   Platelets 150 - 400 K/uL 249  309  214        Latest Ref Rng & Units 08/02/2024    3:45 AM 08/01/2024    4:41 AM 07/31/2024    6:47 AM  CMP  Glucose 70 - 99 mg/dL 871  93  98   BUN 8 -  23 mg/dL 31  29  28    Creatinine 0.44 - 1.00 mg/dL 9.21  9.10  9.23   Sodium 135 - 145 mmol/L 135  135  140   Potassium 3.5 - 5.1 mmol/L 3.8  3.7  4.3   Chloride 98 - 111 mmol/L 103  104  104   CO2 22 - 32 mmol/L 20  19  22    Calcium  8.9 - 10.3 mg/dL 8.3  8.5  8.5     No results found.  ASSESSMENT/PLAN:  Assessment: Principal Problem:   Muscle spasticity Active Problems:   Cerebral palsy (HCC)   Chronic pain due to cerebral palsy   Chronic diarrhea of unknown origin   Impaired mobility   History of progressive weakness   Zoster, V1 distribution without ocular involvement   Rapidly progressive weakness   Spinal stenosis in cervical region  Kathy Owens is a 68 y.o. with a pertinent PMH of cerebral palsy and recently treated herpes zoster (Valtrex) who presented with concerns of increased spasticity and weakness and admitted for further workup for weakness.  Plan: #Generalized Muscle Spasticity  #Cerebral Palsy  Patient reporting increased stiffness causing weakness since April 2025.  Dr. Cornelio evaluated for CIR admission 10/7, and believes patient  to be appropriate candidate for inpatient rehab for strengthening and a power wheelchair evaluation. However, there is an unclear reason for her upper motor neuron spasticity. Patient's tone in musculature was mild and she was able to ambulate short distances with minimal DME (only RW) in April.  MR lumbar spine showing severe canal stenosis at L1-L2. Patient is uninterested in surgery, and neurosurgery did not believe that her stenosis would cause the increased spasticity. Today, she feels improved from a spasticity perspective as well as her weakness. Pending CIR placement and insurance authorization. - Continue Flexeril  for spasms  - Dantrolene 25mg  BID per PM&R  - Continue to monitor BMP, Mag - Pending CIR placement - Daily PT/OT   #Impaired Mobility #History of Progressive weakness   Patient had not been able to make multiple visits to PM&R clinic in person due to weakness and mobility issues with contractures starting beginning of this year being noted. Reportedly worsened when patient was on increased doses of baclofen  and dantrolene. MRI Thoracic spine without any pathology. MRI Lumbar spine showing severe canal stenosis at L1-L2. Patient could benefit from outpatient neurology follow up for possible Botox injections with Dr. Asberry Tat.  -Daily PT/OT   #Zoster, V1 distribution without ocular involvement, resolved  Diagnosed 9/27 and finished a 14 day course of Valtrex 1000mg  BID on 10/11. Ophthalmology saw patient in ED on 9/27 and ruled out any ocular involvement but did note elevated intraocular pressure that will need to be followed up outpatient. Continue eye drops for any irritation caused by crust falling into eyes, and Eucerin cream for dryness of skin. Healing well.  - Eucerin cream, eye drops   #Hypokalemia, resolved #Hypomagnesemia, resolved  POA with potassium of 3.3 and magnesium of 1.6. Patient has history of chronic diarrhea treated with imodium  which could be contributing.  Will replenish electrolytes as appropriate.  - BMP, Mag daily - Continue loperamide  2mg  daily   #Chronic pain due to cerebral palsy   On home hydrocodone  - acetaminophen  5-235 mg tablet. Will continue inpatient, although patient has not required any PRN pain medications   #Hyperlipidemia   Continue Lipitor 10 mg.  Best Practice: Diet: Regular diet VTE: rivaroxaban (XARELTO) tablet 10 mg Start: 08/02/24 1000 SCDs Start: 07/24/24  0200 Code: Full  Disposition planning: Therapy Recs: CIR,  DISPO: Anticipated discharge to CIR pending authorization.  Signature:  Arshiya Jakes, DO [facility]  10:21 AM, 08/02/2024  On Call pager [phone]

## 2024-08-02 NOTE — Progress Notes (Signed)
 Physical Therapy Treatment Patient Details Name: Kathy Owens MRN: [id number] DOB: January 16, 1956 Today's Date: 08/02/2024   History of Present Illness 68 y/o F presenting to ED on 10/4 with incr spasticity x2 weeks. PMH includes cerebral palsy, herpes zoster on valtrex, HTN, anterior ischemic bilateral optic neuropathy, vertigo, HLD, osteoporosis    PT Comments  Patient progressing this session and eager to get to where she can manage on her own for d/c home.  Only one person assist throughout for bed mobility, balance, transfers and stepping to recliner.  She maintains wide stance with L knee and hip internal rotation and foot pronation.  She denies pain though at high risk for falls even assisted at this time.  Continue to recommend inpatient rehab (>3 hours/day) prior to d/c home.    If plan is discharge home, recommend the following: A lot of help with walking and/or transfers;A lot of help with bathing/dressing/bathroom;Assistance with cooking/housework;Assist for transportation;Help with stairs or ramp for entrance   Can travel by private vehicle        Equipment Recommendations  Other (comment) (TBA)    Recommendations for Other Services       Precautions / Restrictions Precautions Precautions: Fall Recall of Precautions/Restrictions: Intact Precaution/Restrictions Comments: hx of CP with spasticity     Mobility  Bed Mobility Overal bed mobility: Needs Assistance Bed Mobility: Supine to Sit     Supine to sit: HOB elevated, Used rails, Mod assist, Max assist     General bed mobility comments: pt able to move R leg off EOB assist for L and scooting hips. using rail to lift trunk though still needed a hand to lift and rebalance once scooted to EOB    Transfers Overall transfer level: Needs assistance Equipment used: Rolling walker (2 wheels) Transfers: Sit to/from Stand, Bed to chair/wheelchair/BSC Sit to Stand: Mod assist   Step pivot transfers: Mod assist        General transfer comment: stood from EOB after increased time for scooting further to EOB and for foot placement. Some help to lift and balance once standing; pt able to take some scooting steps with greatly increased time and assist for walker proximity; having to have help to turn walker though pt jerking it to move it around to get to chair, cues for backing all the way up with L especially    Ambulation/Gait                   Stairs             Wheelchair Mobility     Tilt Bed    Modified Rankin (Stroke Patients Only)       Balance Overall balance assessment: Needs assistance   Sitting balance-Leahy Scale: Poor Sitting balance - Comments: using rail for support initially   Standing balance support: Bilateral upper extremity supported, During functional activity Standing balance-Leahy Scale: Poor Standing balance comment: UE support on walker for balance and min A                            Communication Communication Communication: Impaired Factors Affecting Communication: Hearing impaired;Reduced clarity of speech  Cognition Arousal: Alert Behavior During Therapy: WFL for tasks assessed/performed   PT - Cognitive impairments: No apparent impairments                         Following commands: Intact  Cueing Cueing Techniques: Verbal cues  Exercises      General Comments General comments (skin integrity, edema, etc.): seated EOB and PT assisted to comb hair, peroneal hygiene performed in standing; washed her face in supine and A for warm compress on L Eye      Pertinent Vitals/Pain Pain Assessment Pain Assessment: No/denies pain    Home Living                          Prior Function            PT Goals (current goals can now be found in the care plan section) Progress towards PT goals: Progressing toward goals    Frequency    Min 2X/week      PT Plan      Co-evaluation               AM-PAC PT 6 Clicks Mobility   Outcome Measure  Help needed turning from your back to your side while in a flat bed without using bedrails?: A Lot Help needed moving from lying on your back to sitting on the side of a flat bed without using bedrails?: Total Help needed moving to and from a bed to a chair (including a wheelchair)?: A Lot Help needed standing up from a chair using your arms (e.g., wheelchair or bedside chair)?: A Lot Help needed to walk in hospital room?: Total Help needed climbing 3-5 steps with a railing? : Total 6 Click Score: 9    End of Session Equipment Utilized During Treatment: Gait belt Activity Tolerance: Patient limited by fatigue Patient left: in chair;with call bell/phone within reach;with chair alarm set Nurse Communication: Mobility status;Need for lift equipment PT Visit Diagnosis: Unsteadiness on feet (R26.81);Other abnormalities of gait and mobility (R26.89);Muscle weakness (generalized) (M62.81)     Time: 1125-1205 PT Time Calculation (min) (ACUTE ONLY): 40 min  Charges:    $Therapeutic Activity: 23-37 mins $Self Care/Home Management: 8-22 PT General Charges $$ ACUTE PT VISIT: 1 Visit                     Micheline Owens, PT Acute Rehabilitation Services [phone] 08/02/2024    Kathy Owens 08/02/2024, 1:44 PM

## 2024-08-03 DIAGNOSIS — M62838 Other muscle spasm: Secondary | ICD-10-CM | POA: Diagnosis not present

## 2024-08-03 DIAGNOSIS — G809 Cerebral palsy, unspecified: Secondary | ICD-10-CM | POA: Diagnosis not present

## 2024-08-03 DIAGNOSIS — Z79891 Long term (current) use of opiate analgesic: Secondary | ICD-10-CM

## 2024-08-03 DIAGNOSIS — G8929 Other chronic pain: Secondary | ICD-10-CM | POA: Diagnosis not present

## 2024-08-03 LAB — BASIC METABOLIC PANEL WITH GFR
Anion gap: 10 (ref 5–15)
BUN: 31 mg/dL — ABNORMAL HIGH (ref 8–23)
CO2: 21 mmol/L — ABNORMAL LOW (ref 22–32)
Calcium: 8.4 mg/dL — ABNORMAL LOW (ref 8.9–10.3)
Chloride: 104 mmol/L (ref 98–111)
Creatinine, Ser: 0.94 mg/dL (ref 0.44–1.00)
GFR, Estimated: >60 mL/min (ref >60)
Glucose, Bld: 112 mg/dL — ABNORMAL HIGH (ref 70–99)
Potassium: 3.7 mmol/L (ref 3.5–5.1)
Sodium: 135 mmol/L (ref 135–145)

## 2024-08-03 LAB — MAGNESIUM: Magnesium: 2 mg/dL (ref 1.7–2.4)

## 2024-08-03 NOTE — Progress Notes (Addendum)
 HD#8 SUBJECTIVE:  Patient Summary: Kathy Owens is a 68 y.o. with a pertinent PMH of cerebral palsy and recently treated herpes zoster (Valtrex) who presented with concerns of increased spasticity and weakness and admitted for further workup for weakness, which is improving.   Overnight Events: None  Interim History: No changes today. She is waiting for a CIR bed today, as she was approved for placement late on 10/14. Hopeful to start inpatient therapy soon to start strength training.  OBJECTIVE:  Vital Signs: Vitals:   08/02/24 1950 08/02/24 2319 08/03/24 0300 08/03/24 0842  BP: 129/66 130/71 129/64 135/78  Pulse: 70 73 66 73  Resp: 16 16 16 18   Temp: 98.7 F (37.1 C) 98.4 F (36.9 C) 97.7 F (36.5 C) 98.5 F (36.9 C)  TempSrc: Oral Oral Oral   SpO2: 98% 96% 97% 98%  Weight:      Height:       Supplemental O2: Room Air   Filed Weights   07/24/24 0315  Weight: 67.3 kg    No intake or output data in the 24 hours ending 08/03/24 0947  Net IO Since Admission: -1,887 mL [08/03/24 0947]  Physical Exam: Physical Exam Constitutional:      General: She is not in acute distress.    Appearance: She is not ill-appearing.  HENT:     Mouth/Throat:     Mouth: Mucous membranes are dry.  Eyes:     Extraocular Movements: Extraocular movements intact.  Cardiovascular:     Rate and Rhythm: Normal rate and regular rhythm.  Pulmonary:     Effort: Pulmonary effort is normal. No respiratory distress.     Breath sounds: No wheezing or rales.  Abdominal:     General: Abdomen is flat. Bowel sounds are normal. There is no distension.     Tenderness: There is no abdominal tenderness.  Musculoskeletal:     Right lower leg: No edema.     Left lower leg: No edema.     Comments: RUE strength 4+/5 LUE strength 4/5 BLE strength 3/5 Mild spasticity present  Skin:    Comments: Healing lesions over V1 distribution on the left  Neurological:     Mental Status: She is alert.       Patient Lines/Drains/Airways Status     Active Line/Drains/Airways     Name Placement date Placement time Site Days   Peripheral IV 07/23/24 20 G 1 Right Antecubital 07/23/24  2320  Antecubital  7   Wound 07/24/24 0800 Other (Comment) Face Left;Upper 07/24/24  0800  Face  6            Pertinent labs and imaging:      Latest Ref Rng & Units 07/24/2024    4:27 AM 07/23/2024   11:21 PM 07/13/2024    8:16 PM  CBC  WBC 4.0 - 10.5 K/uL 9.2  8.6  4.6   Hemoglobin 12.0 - 15.0 g/dL 86.1  86.2  86.3   Hematocrit 36.0 - 46.0 % 40.9  41.0  40.9   Platelets 150 - 400 K/uL 249  309  214        Latest Ref Rng & Units 08/03/2024    1:48 AM 08/02/2024    3:45 AM 08/01/2024    4:41 AM  CMP  Glucose 70 - 99 mg/dL 887  871  93   BUN 8 - 23 mg/dL 31  31  29    Creatinine 0.44 - 1.00 mg/dL 9.05  9.21  9.10  Sodium 135 - 145 mmol/L 135  135  135   Potassium 3.5 - 5.1 mmol/L 3.7  3.8  3.7   Chloride 98 - 111 mmol/L 104  103  104   CO2 22 - 32 mmol/L 21  20  19    Calcium  8.9 - 10.3 mg/dL 8.4  8.3  8.5     No results found.  ASSESSMENT/PLAN:  Assessment: Principal Problem:   Muscle spasticity Active Problems:   Cerebral palsy (HCC)   Chronic pain due to cerebral palsy   Chronic diarrhea of unknown origin   Impaired mobility   History of progressive weakness   Zoster, V1 distribution without ocular involvement   Rapidly progressive weakness   Spinal stenosis in cervical region  Kathy Owens is a 68 y.o. with a pertinent PMH of cerebral palsy and recently treated herpes zoster (Valtrex) who presented with concerns of increased spasticity and weakness and admitted for further workup for weakness.  Plan: #Generalized Muscle Spasticity  #Cerebral Palsy  Patient reporting increased stiffness causing weakness since April 2025.  Dr. Cornelio evaluated for CIR admission 10/7, and has been able to get patient approved for CIR. Pending bed placement, ideally 10/16. - Continue Flexeril   for spasms  - Dantrolene 25mg  BID per PM&R  - Daily PT/OT   #Impaired Mobility #History of Progressive weakness   Patient had not been able to make multiple visits to PM&R clinic in person due to weakness and mobility issues with contractures starting beginning of this year being noted. Reportedly worsened when patient was on increased doses of baclofen  and dantrolene. MRI Thoracic spine without any pathology. MRI Lumbar spine showing severe canal stenosis at L1-L2. Patient could benefit from outpatient neurology follow up for possible Botox injections with Dr. Asberry Tat.  -Daily PT/OT   #Zoster, V1 distribution without ocular involvement, resolved  Diagnosed 9/27 and finished a 14 day course of Valtrex 1000mg  BID on 10/11. Ophthalmology saw patient in ED on 9/27 and ruled out any ocular involvement but did note elevated intraocular pressure that will need to be followed up outpatient. Continue PRN eye drops for any irritation caused by crust falling into eyes, and Eucerin cream for dryness of skin. Healing well.  - Eucerin cream, eye drops   #Hypokalemia, resolved #Hypomagnesemia, resolved  POA with potassium of 3.3 and magnesium of 1.6. Patient has history of chronic diarrhea treated with imodium  which could be contributing. Will replenish electrolytes as appropriate.  - Continue loperamide  2mg  daily   #Chronic pain due to cerebral palsy   On home hydrocodone  - acetaminophen  5-235 mg tablet. Will continue inpatient, although patient has not required any PRN pain medications   #Hyperlipidemia   Continue Lipitor 10 mg.  Best Practice: Diet: Regular diet VTE: rivaroxaban (XARELTO) tablet 10 mg Start: 08/02/24 1000 SCDs Start: 07/24/24 0200 Code: Full  Disposition planning: Therapy Recs: CIR  DISPO: Anticipated discharge to CIR tomorrow pending bed placement.  Signature:  Lineth Thielke, DO [facility]  9:47 AM, 08/03/2024  On Call pager [phone]

## 2024-08-03 NOTE — Plan of Care (Signed)
°  Problem: Clinical Measurements: °Goal: Will remain free from infection °Outcome: Progressing °  °Problem: Activity: °Goal: Risk for activity intolerance will decrease °Outcome: Progressing °  °Problem: Nutrition: °Goal: Adequate nutrition will be maintained °Outcome: Progressing °  °Problem: Elimination: °Goal: Will not experience complications related to bowel motility °Outcome: Progressing °  °

## 2024-08-03 NOTE — Progress Notes (Signed)
  Inpatient Rehabilitation Admissions Coordinator   CIR bed is not available to admit her to today. Hopeful for bed tomorrow.  Heron Leavell, RN, MSN Rehab Admissions Coordinator [phone] 08/03/2024 9:44 AM

## 2024-08-03 NOTE — Treatment Plan (Signed)
 Occupational Therapy Treatment Patient Details Name: Kathy Owens MRN: [id number] DOB: 1956-09-10 Today's Date: 08/03/2024   History of present illness 68 y/o F presenting to ED on 10/4 with incr spasticity x2 weeks. PMH includes cerebral palsy, herpes zoster on valtrex, HTN, anterior ischemic bilateral optic neuropathy, vertigo, HLD, osteoporosis   OT comments  Pt transferred to L side of the bed for grooming task in sitting position. Pt declined oob to chair due to fatigue and reports prior day with prolonged sitting without (A) back to bed. Recommendation for AIR remains appropriate.      If plan is discharge home, recommend the following:  Two people to help with walking and/or transfers;A lot of help with bathing/dressing/bathroom;Assistance with cooking/housework;Assistance with feeding;Direct supervision/assist for medications management;Direct supervision/assist for financial management;Assist for transportation;Help with stairs or ramp for entrance   Equipment Recommendations  BSC/3in1;Other (comment)    Recommendations for Other Services Rehab consult;PT consult    Precautions / Restrictions Precautions Precautions: Fall Recall of Precautions/Restrictions: Intact Precaution/Restrictions Comments: hx of CP with spasticity       Mobility Bed Mobility Overal bed mobility: Needs Assistance Bed Mobility: Rolling, Supine to Sit, Sit to Supine Rolling: Max assist, Used rails   Supine to sit: HOB elevated, Used rails, Mod assist Sit to supine: Max assist   General bed mobility comments: pt with pad used and bed tilted to pull toward HOB. pt positioned for comfort at the end of session    Transfers                         Balance                                           ADL either performed or assessed with clinical judgement   ADL Overall ADL's : Needs assistance/impaired   Eating/Feeding Details (indicate cue type and reason): pt  noted to have spilled coffee and food on chest. pt with decreased hand to mouth ROM Grooming: Maximal assistance Grooming Details (indicate cue type and reason): sitting eob for shampoo cap washing of hair and combing of hair. pt noted to have break down                               General ADL Comments: pt agreeable to bed level mobility and politely declines oob to chair due to prolonged sitting the prior day per patient. pt also expressed feeling fatigued due to poor sleep 08/02/24    Extremity/Trunk Assessment Upper Extremity Assessment Upper Extremity Assessment: Overall WFL for tasks assessed   Lower Extremity Assessment Lower Extremity Assessment: Defer to PT evaluation        Vision   Additional Comments: L eye edema and redness remains present. pt reports changes in sensation around the eye today   Perception     Praxis     Communication Communication Communication: Impaired Factors Affecting Communication: Hearing impaired   Cognition Arousal: Alert Behavior During Therapy: WFL for tasks assessed/performed Cognition: No family/caregiver present to determine baseline, Cognition impaired             OT - Cognition Comments: pt reports fatigue due to poor sleep and interruptions in the evening hours                 Following  commands: Intact        Cueing      Exercises      Shoulder Instructions       General Comments      Pertinent Vitals/ Pain       Pain Assessment Pain Assessment: No/denies pain  Home Living                                          Prior Functioning/Environment              Frequency  Min 2X/week        Progress Toward Goals  OT Goals(current goals can now be found in the care plan section)  Progress towards OT goals: Progressing toward goals  Acute Rehab OT Goals Patient Stated Goal: to go to rehab OT Goal Formulation: With patient Time For Goal Achievement:  08/07/24 Potential to Achieve Goals: Good ADL Goals Pt Will Perform Grooming: with contact guard assist;sitting Pt Will Perform Upper Body Dressing: with contact guard assist;sitting Pt Will Perform Lower Body Dressing: sit to/from stand;sitting/lateral leans;with min assist Pt Will Transfer to Toilet: with min assist;bedside commode;squat pivot transfer;stand pivot transfer Additional ADL Goal #1: pt will demo good sitting balance in prep for seated ADLs  Plan      Co-evaluation                 AM-PAC OT 6 Clicks Daily Activity     Outcome Measure   Help from another person eating meals?: A Little Help from another person taking care of personal grooming?: A Little Help from another person toileting, which includes using toliet, bedpan, or urinal?: A Lot Help from another person bathing (including washing, rinsing, drying)?: A Lot Help from another person to put on and taking off regular upper body clothing?: A Lot Help from another person to put on and taking off regular lower body clothing?: A Lot 6 Click Score: 14    End of Session    OT Visit Diagnosis: Unsteadiness on feet (R26.81);Other abnormalities of gait and mobility (R26.89);Muscle weakness (generalized) (M62.81)   Activity Tolerance Patient tolerated treatment well   Patient Left in bed;with call bell/phone within reach;with bed alarm set   Nurse Communication Mobility status;Precautions        Time: 8899-8872 OT Time Calculation (min): 27 min  Charges: OT General Charges $OT Visit: 1 Visit OT Treatments $Self Care/Home Management : 23-37 mins   Brynn, OTR/L  Acute Rehabilitation Services Office: [phone] .   Ely Molt 08/03/2024, 2:27 PM

## 2024-08-04 ENCOUNTER — Inpatient Hospital Stay (HOSPITAL_COMMUNITY)
Admission: AD | Admit: 2024-08-04 | Discharge: 2024-08-23 | DRG: 945 | Disposition: A | Discharge status: Home Health | Source: Intra-hospital | Attending: Physical Medicine and Rehabilitation | Admitting: Physical Medicine and Rehabilitation

## 2024-08-04 ENCOUNTER — Other Ambulatory Visit: Payer: Self-pay

## 2024-08-04 ENCOUNTER — Encounter (HOSPITAL_COMMUNITY): Payer: Self-pay | Admitting: Physical Medicine and Rehabilitation

## 2024-08-04 DIAGNOSIS — E785 Hyperlipidemia, unspecified: Secondary | ICD-10-CM | POA: Diagnosis present

## 2024-08-04 DIAGNOSIS — J479 Bronchiectasis, uncomplicated: Secondary | ICD-10-CM | POA: Diagnosis not present

## 2024-08-04 DIAGNOSIS — G8929 Other chronic pain: Secondary | ICD-10-CM | POA: Diagnosis present

## 2024-08-04 DIAGNOSIS — N183 Chronic kidney disease, stage 3 unspecified: Secondary | ICD-10-CM | POA: Diagnosis present

## 2024-08-04 DIAGNOSIS — F54 Psychological and behavioral factors associated with disorders or diseases classified elsewhere: Secondary | ICD-10-CM | POA: Diagnosis not present

## 2024-08-04 DIAGNOSIS — D631 Anemia in chronic kidney disease: Secondary | ICD-10-CM | POA: Diagnosis not present

## 2024-08-04 DIAGNOSIS — Z79899 Other long term (current) drug therapy: Secondary | ICD-10-CM

## 2024-08-04 DIAGNOSIS — M810 Age-related osteoporosis without current pathological fracture: Secondary | ICD-10-CM | POA: Diagnosis present

## 2024-08-04 DIAGNOSIS — R234 Changes in skin texture: Secondary | ICD-10-CM | POA: Diagnosis not present

## 2024-08-04 DIAGNOSIS — E669 Obesity, unspecified: Secondary | ICD-10-CM | POA: Diagnosis present

## 2024-08-04 DIAGNOSIS — N200 Calculus of kidney: Secondary | ICD-10-CM | POA: Diagnosis not present

## 2024-08-04 DIAGNOSIS — Z87898 Personal history of other specified conditions: Secondary | ICD-10-CM

## 2024-08-04 DIAGNOSIS — R7989 Other specified abnormal findings of blood chemistry: Secondary | ICD-10-CM | POA: Diagnosis not present

## 2024-08-04 DIAGNOSIS — E1122 Type 2 diabetes mellitus with diabetic chronic kidney disease: Secondary | ICD-10-CM | POA: Diagnosis present

## 2024-08-04 DIAGNOSIS — R197 Diarrhea, unspecified: Secondary | ICD-10-CM | POA: Diagnosis not present

## 2024-08-04 DIAGNOSIS — M48061 Spinal stenosis, lumbar region without neurogenic claudication: Secondary | ICD-10-CM | POA: Diagnosis present

## 2024-08-04 DIAGNOSIS — K529 Noninfective gastroenteritis and colitis, unspecified: Secondary | ICD-10-CM | POA: Diagnosis present

## 2024-08-04 DIAGNOSIS — Z6830 Body mass index (BMI) 30.0-30.9, adult: Secondary | ICD-10-CM | POA: Diagnosis not present

## 2024-08-04 DIAGNOSIS — K8020 Calculus of gallbladder without cholecystitis without obstruction: Secondary | ICD-10-CM | POA: Diagnosis not present

## 2024-08-04 DIAGNOSIS — I951 Orthostatic hypotension: Secondary | ICD-10-CM | POA: Diagnosis present

## 2024-08-04 DIAGNOSIS — H5789 Other specified disorders of eye and adnexa: Secondary | ICD-10-CM | POA: Diagnosis present

## 2024-08-04 DIAGNOSIS — F4321 Adjustment disorder with depressed mood: Secondary | ICD-10-CM | POA: Diagnosis not present

## 2024-08-04 DIAGNOSIS — M549 Dorsalgia, unspecified: Secondary | ICD-10-CM | POA: Diagnosis not present

## 2024-08-04 DIAGNOSIS — M4804 Spinal stenosis, thoracic region: Secondary | ICD-10-CM | POA: Diagnosis present

## 2024-08-04 DIAGNOSIS — B029 Zoster without complications: Secondary | ICD-10-CM | POA: Diagnosis present

## 2024-08-04 DIAGNOSIS — R911 Solitary pulmonary nodule: Secondary | ICD-10-CM | POA: Diagnosis not present

## 2024-08-04 DIAGNOSIS — Z7409 Other reduced mobility: Secondary | ICD-10-CM | POA: Diagnosis present

## 2024-08-04 DIAGNOSIS — G894 Chronic pain syndrome: Secondary | ICD-10-CM | POA: Diagnosis not present

## 2024-08-04 DIAGNOSIS — D649 Anemia, unspecified: Secondary | ICD-10-CM | POA: Diagnosis not present

## 2024-08-04 DIAGNOSIS — I82409 Acute embolism and thrombosis of unspecified deep veins of unspecified lower extremity: Secondary | ICD-10-CM | POA: Diagnosis not present

## 2024-08-04 DIAGNOSIS — B0231 Zoster conjunctivitis: Secondary | ICD-10-CM

## 2024-08-04 DIAGNOSIS — G809 Cerebral palsy, unspecified: Secondary | ICD-10-CM | POA: Diagnosis present

## 2024-08-04 DIAGNOSIS — G808 Other cerebral palsy: Principal | ICD-10-CM | POA: Diagnosis present

## 2024-08-04 DIAGNOSIS — R5381 Other malaise: Principal | ICD-10-CM | POA: Diagnosis present

## 2024-08-04 DIAGNOSIS — Z742 Need for assistance at home and no other household member able to render care: Secondary | ICD-10-CM

## 2024-08-04 DIAGNOSIS — M2351 Chronic instability of knee, right knee: Secondary | ICD-10-CM | POA: Diagnosis present

## 2024-08-04 DIAGNOSIS — G800 Spastic quadriplegic cerebral palsy: Secondary | ICD-10-CM | POA: Diagnosis not present

## 2024-08-04 DIAGNOSIS — R252 Cramp and spasm: Secondary | ICD-10-CM | POA: Diagnosis present

## 2024-08-04 DIAGNOSIS — G992 Myelopathy in diseases classified elsewhere: Secondary | ICD-10-CM | POA: Diagnosis present

## 2024-08-04 DIAGNOSIS — M5416 Radiculopathy, lumbar region: Secondary | ICD-10-CM | POA: Diagnosis present

## 2024-08-04 DIAGNOSIS — Z7983 Long term (current) use of bisphosphonates: Secondary | ICD-10-CM

## 2024-08-04 DIAGNOSIS — R531 Weakness: Secondary | ICD-10-CM

## 2024-08-04 DIAGNOSIS — M62838 Other muscle spasm: Secondary | ICD-10-CM | POA: Diagnosis not present

## 2024-08-04 DIAGNOSIS — M4802 Spinal stenosis, cervical region: Secondary | ICD-10-CM | POA: Diagnosis present

## 2024-08-04 DIAGNOSIS — Z8249 Family history of ischemic heart disease and other diseases of the circulatory system: Secondary | ICD-10-CM

## 2024-08-04 DIAGNOSIS — I129 Hypertensive chronic kidney disease with stage 1 through stage 4 chronic kidney disease, or unspecified chronic kidney disease: Secondary | ICD-10-CM | POA: Diagnosis present

## 2024-08-04 DIAGNOSIS — Z96659 Presence of unspecified artificial knee joint: Secondary | ICD-10-CM | POA: Diagnosis not present

## 2024-08-04 MED ORDER — GUAIFENESIN-DM 100-10 MG/5ML PO SYRP: 5.0000 mL | ORAL_SOLUTION | Freq: Four times a day (QID) | ORAL | Status: DC | PRN

## 2024-08-04 MED ORDER — TRAZODONE HCL 50 MG PO TABS
25.0000 mg | ORAL_TABLET | Freq: Every evening | ORAL | Status: DC | PRN
  Administered 2024-08-13 – 2024-08-18 (×3): 50 mg via ORAL
  Filled 2024-08-04 (×3): qty 1

## 2024-08-04 MED ORDER — PROCHLORPERAZINE MALEATE 5 MG PO TABS: 5.0000 mg | ORAL_TABLET | Freq: Four times a day (QID) | ORAL | Status: DC | PRN

## 2024-08-04 MED ORDER — FLEET ENEMA RE ENEM: 1.0000 | ENEMA | Freq: Once | RECTAL | Status: DC | PRN

## 2024-08-04 MED ORDER — POLYVINYL ALCOHOL 1.4 % OP SOLN: 1.0000 [drp] | OPHTHALMIC | Status: DC | PRN

## 2024-08-04 MED ORDER — ATORVASTATIN CALCIUM 10 MG PO TABS
10.0000 mg | ORAL_TABLET | Freq: Every day | ORAL | Status: DC
  Administered 2024-08-05 – 2024-08-23 (×19): 10 mg via ORAL
  Filled 2024-08-04 (×19): qty 1

## 2024-08-04 MED ORDER — HYDROCERIN EX CREA
TOPICAL_CREAM | Freq: Two times a day (BID) | CUTANEOUS | Status: DC
  Administered 2024-08-06 – 2024-08-14 (×3): 1 via TOPICAL
  Filled 2024-08-04: qty 113

## 2024-08-04 MED ORDER — DIPHENHYDRAMINE HCL 25 MG PO CAPS: 25.0000 mg | ORAL_CAPSULE | Freq: Four times a day (QID) | ORAL | Status: DC | PRN

## 2024-08-04 MED ORDER — BISACODYL 10 MG RE SUPP: 10.0000 mg | Freq: Every day | RECTAL | Status: DC | PRN

## 2024-08-04 MED ORDER — PROCHLORPERAZINE 25 MG RE SUPP: 12.5000 mg | Freq: Four times a day (QID) | RECTAL | Status: DC | PRN

## 2024-08-04 MED ORDER — DANTROLENE SODIUM 25 MG PO CAPS
25.0000 mg | ORAL_CAPSULE | Freq: Two times a day (BID) | ORAL | Status: DC
  Administered 2024-08-04: 25 mg via ORAL
  Filled 2024-08-04 (×3): qty 1

## 2024-08-04 MED ORDER — ALUM & MAG HYDROXIDE-SIMETH 200-200-20 MG/5ML PO SUSP
30.0000 mL | ORAL | Status: DC | PRN
  Administered 2024-08-12 – 2024-08-18 (×2): 30 mL via ORAL
  Filled 2024-08-04 (×2): qty 30

## 2024-08-04 MED ORDER — ACETAMINOPHEN 325 MG PO TABS
325.0000 mg | ORAL_TABLET | ORAL | Status: DC | PRN
  Administered 2024-08-13 – 2024-08-22 (×6): 650 mg via ORAL
  Filled 2024-08-04 (×6): qty 2

## 2024-08-04 MED ORDER — CYCLOBENZAPRINE HCL 5 MG PO TABS
5.0000 mg | ORAL_TABLET | Freq: Three times a day (TID) | ORAL | Status: DC
Start: 2024-08-04 — End: 2024-08-23
  Administered 2024-08-04 – 2024-08-23 (×57): 5 mg via ORAL
  Filled 2024-08-04 (×56): qty 1

## 2024-08-04 MED ORDER — DANTROLENE SODIUM 25 MG PO CAPS: 25.0000 mg | ORAL_CAPSULE | Freq: Two times a day (BID) | ORAL | Status: DC

## 2024-08-04 MED ORDER — PROCHLORPERAZINE EDISYLATE 10 MG/2ML IJ SOLN: 5.0000 mg | Freq: Four times a day (QID) | INTRAMUSCULAR | Status: DC | PRN

## 2024-08-04 MED ORDER — RIVAROXABAN 10 MG PO TABS
10.0000 mg | ORAL_TABLET | Freq: Every day | ORAL | Status: DC
  Administered 2024-08-05 – 2024-08-23 (×19): 10 mg via ORAL
  Filled 2024-08-04 (×19): qty 1

## 2024-08-04 MED ORDER — HYDROCODONE-ACETAMINOPHEN 5-325 MG PO TABS
1.0000 | ORAL_TABLET | Freq: Four times a day (QID) | ORAL | Status: DC | PRN
  Administered 2024-08-11: 1 via ORAL
  Filled 2024-08-04 (×3): qty 1

## 2024-08-04 MED ORDER — ENSURE PLUS HIGH PROTEIN PO LIQD
237.0000 mL | Freq: Two times a day (BID) | ORAL | Status: AC
Start: 2024-08-05 — End: ?

## 2024-08-04 NOTE — Plan of Care (Signed)
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
## 2024-08-04 NOTE — Discharge Summary (Signed)
 Name: Kathy Owens MRN: [id number] DOB: 08-Mar-1956 68 y.o. PCP: Trudy Mliss Dragon, MD  Date of Admission: 07/23/2024  7:47 PM Date of Discharge: 08/04/2024  Attending Physician: Dr. Reyes Fenton  Discharge Diagnosis: Principal Problem:   Muscle spasticity Active Problems:   Cerebral palsy (HCC)   Chronic pain due to cerebral palsy   Chronic diarrhea of unknown origin   Impaired mobility   History of progressive weakness   Zoster, V1 distribution without ocular involvement   Rapidly progressive weakness   Spinal stenosis in cervical region  Hypokalemia  Hypomagnesemia    Discharge Medications: Allergies as of 08/04/2024       Reactions   Firvanq  [vancomycin ] Hives   PO vancomycin  for C.diff (not IV).  TDD 11/09/18.   Ms Contin [morphine] Other (See Comments)   Hallucinations   Lioresal  [baclofen ] Other (See Comments)   Hallucinations    Nsaids Other (See Comments)   Hx kidney disease   Zanaflex  [tizanidine ] Other (See Comments)   Hallucinations         Medication List     PAUSE taking these medications    dantrolene 50 MG capsule Wait to take this until your doctor or other care provider tells you to start again. Commonly known as: DANTRIUM Take 1 capsule (50 mg total) by mouth at bedtime. X 1 week, then 50 mg BID x 1 week, then 50 mg in AM, and 100 mg at bedtime x 1 week- 100 mg BID- for spasticity- failed Baclofen  and Zanaflex - and Flexeril  You also have another medication with the same name that you may need to continue taking.       STOP taking these medications    valACYclovir 1000 MG tablet Commonly known as: VALTREX       TAKE these medications    acetaminophen  500 MG tablet Commonly known as: TYLENOL  Take 1,000 mg by mouth 2 (two) times daily as needed for headache or fever (pain).   alendronate  70 MG tablet Commonly known as: Fosamax  Take 1 tablet (70 mg total) by mouth every 7 (seven) days. Take with a full glass of water on  an empty stomach. What changed:  when to take this additional instructions   atorvastatin  10 MG tablet Commonly known as: LIPITOR TAKE 1 TABLET(10 MG) BY MOUTH DAILY What changed:  how much to take how to take this when to take this additional instructions   cyclobenzaprine  5 MG tablet Commonly known as: FLEXERIL  Take 1 tablet (5 mg total) by mouth 3 (three) times daily. For muscle spasms.   dantrolene 25 MG capsule Commonly known as: DANTRIUM Take 1 capsule (25 mg total) by mouth 2 (two) times daily. What changed: Another medication with the same name was paused. Ask your nurse or doctor if you should take this medication.   HYDROcodone -acetaminophen  5-325 MG tablet Commonly known as: NORCO/VICODIN Take 1 tablet by mouth every 6 (six) hours as needed for moderate pain (pain score 4-6) or severe pain (pain score 7-10).   loperamide  2 MG capsule Commonly known as: IMODIUM  TAKE 1 CAPSULE(2 MG) BY MOUTH DAILY FOR UP TO 360 DOSES AS NEEDED FOR DIARRHEA OR LOOSE STOOLS What changed: See the new instructions.   triamterene -hydrochlorothiazide  37.5-25 MG tablet Commonly known as: MAXZIDE -25 Take 1 tablet by mouth daily.        Disposition and follow-up:   Kathy Owens was discharged from [facility] in Good condition.  At the hospital follow up visit please address:  1.  Follow-up:  a. Resolved Zoster Infection. Patient finished Valtrex on 10/11 but would benefit from Zostavax. Also needs to follow up with ophthalmology outpatient, as she was found to have elevated IOP on 9/27 and warrants further workup.     b. Patient restarted on Dantrolene 25mg  BID on 10/10. Consider checking CBC and CMP.   2.  Labs / imaging needed at time of follow-up: None   Follow-up Appointments: 11/20 at 9:15AM with Dr. Trudy Salvage Course by problem list:       Assessment & Plan by Problem: Principal Problem:   Muscle spasticity Active Problems:    Cerebral palsy (HCC)   Chronic pain due to cerebral palsy   Chronic diarrhea of unknown origin   Impaired mobility   History of progressive weakness   Zoster, V1 distribution without ocular involvement  Kathy Owens is a 68 y.o. with a pertinent PMH of cerebral palsy and recently treated herpes zoster (Valtrex) who presented with concerns of increased spasticity and admitted for observation.    #Generalized Muscle Spasticity  #Cerebral spasticity  Patient reports that since starting Dantrolene 50mg  in 06/2024, she has been having increasing stiffness which has caused her weakness. Previously, patient was only taking Flexeril  to help with spasms and pain, but has trialed multiple antispasmodics, including baclofen  and tizanidine , which have all resulted in side effects of worsened weakness. Cyproheptadine can cause raise IOP and patient already has concern for this since ZVZ infection diagnosis. Diazepam  given age is likely not good treatment option. ESR and CRP negative. Prior to Dantrolene, patient could ambulate with the aid of a rollator and walker. Recently was at [facility] rehab in 01/2024 to improve strength and spasticity. Sees Dr. Lovorn with PM&R but has not been able to be assessed in person prior to admission due to patient's transportation issues. A wheelchair was suggested in 01/2024, however patient believed her strength to have improved with rehab, and declined wheelchair at this time. Etiology for weakness likely multifactorial, including new medications (Dantrolene, Valtrex), recent infection with HSV exacerbating CNS, progression of disease, metabolic derangement (low magnesium, potassium), or aging. Patient without leukocytosis or altered mental status on presentation. Denies bowel/urinary incontinence, shortness of breath, headaches, or fevers. Continued Flexeril  for spasms but held other antispasmodics. Consulted PT/OT, SW, and PM&R, who all recommend CIR placement. Dr. Cornelio  evaluated patient for CIR admission, believed patient to be appropriate candidate for inpatient rehab for strengthening and a power wheelchair evaluation. However,  needed to consider possible acute myelopathy, stroke, or other contributing pathology for [facility] signs. Ordered MRI brain, Cervical spine, Thoracic spine, and Lumbar spine. Recommended added Baclofen  5mg  BID to help with spasticity, however patient concerned about increased sedation and would like to not restart this medicine. MRI Brain showed no acute intracranial abnormality/stroke and chronic microvascular ischemic changes. MR Cervical spine showed moderate to severe left and moderate right C3-C4 foraminal stenosis, and severe left and right foraminal stenosis of C4-C5 with mild canal stenosis. MR thoracic spine shows no stenosis, however partial visualization of L1-L2 shows moderate stenosis at or near the tip of the conus medullaris. NSGY consulted by PM&R for consult, as stenosis of cervical spine should not be contributing to patient's increased weakness or spasticity of her lower extremities, however severe lumbar canal stenosis would contribute to LE weakness. Also consulted Neurology to evaluate patient, recommended checking ionized calcium  and phosphorus, as well as trialing other medications like Clonazepam, Methocarbamol , or outpatient Botox or baclofen  pump. Ordered MRI  Lumar spine, which showed broad disc bulge with ligamentum flavum thickening, resulting in severe canal stenosis at L1-L2, and lateral far right disc protrusion closely approximating the exiting right nerve. Patient does not want surgical intervention per NSGY. Restarted Dantrolene 25mg  BID. Approved for CIR placement on 10/10, and was discharged on 10/16.    #Impaired Mobility #History of Progressive weakness  Patient has not been able to make multiple visits to clinic in person due to weakness and mobility issues with contractures starting beginning of this year.  Worsened when patient was on increased doses of Baclofen  or Dantrolene. Medications could be contributing to weakness, could also be progressive weakness from contractures that have caused deconditioning. PT/OT consulted and recommended CIR. MRI brain, C-spine, and T-spine without causative pathology.    #Zoster, V1 distribution without ocular involvement  Diagnosed 9/27 and was started on Valtrex 1000mg  BID for renal dosing. Completed 14/14 days while admitted. Ophthalmology saw patient in ED on 9/27 and ruled out any ocular involvement but did note elevated intraocular pressure that will need to be followed up outpatient. Continued eye drops for irritation caused by crust falling into eyes, and Eucerin cream for dryness of skin. Discussed with patient importance of ZostaVax during outpatient follow up and monitoring for any worsening eye pain or irritation.    #Hypokalemia #Hypomagnesemia  Found POA with potassium of 3.3 and magnesium of 1.6. Replaced with Kcl 20 mEq x 2 doses and 400 mg Mg  x 2 doses. Patient has history of chronic diarrhea treated with imodium  which could be contributing. Monitored inpatient and replenished electrolytes as appropriate. Continued home loperamide  2mg  daily.   #Chronic pain due to cerebral palsy  Patient prescribed hydrocodone -acetaminophen  5-235 mg tablet outpatient. Continued inpatient.     #Hyperlipidemia  Continued Lipitor 10 mg.       Discharge Subjective: She feels relatively unchanged over the past few days and feels better than she had felt on admission. She has been getting up out of the bed during the day and has been able to sit in the chair. She is excited to go to CIR to work on her strength training and is ready to discharge.  Discharge Exam:   BP (!) 144/62 (BP Location: Left Arm)   Pulse 66   Temp 98.4 F (36.9 C) (Oral)   Resp 18   Ht 5' (1.524 m)   Wt 67.3 kg   SpO2 99%   BMI 28.98 kg/m  Constitutional: well-appearing female laying  in bed, in no acute distress HENT: normocephalic atraumatic, mucous membranes moist Eyes: conjunctiva non-erythematous Neck: supple Cardiovascular: regular rate and rhythm, no m/r/g Pulmonary/Chest: normal work of breathing on room air, lungs clear to auscultation bilaterally Abdominal: soft, non-tender, non-distended MSK: no edema, bilateral UE strength 4/5, BLE 3/5 with mild spasticity present. Neurological: alert & oriented x 3, 5/5 strength in bilateral upper and lower extremities Skin: warm and dry Psych: pleasant mood and affect   Pertinent Labs, Studies, and Procedures:     Latest Ref Rng & Units 07/24/2024    4:27 AM 07/23/2024   11:21 PM 07/13/2024    8:16 PM  CBC  WBC 4.0 - 10.5 K/uL 9.2  8.6  4.6   Hemoglobin 12.0 - 15.0 g/dL 86.1  86.2  86.3   Hematocrit 36.0 - 46.0 % 40.9  41.0  40.9   Platelets 150 - 400 K/uL 249  309  214        Latest Ref Rng & Units 08/03/2024  1:48 AM 08/02/2024    3:45 AM 08/01/2024    4:41 AM  CMP  Glucose 70 - 99 mg/dL 887  871  93   BUN 8 - 23 mg/dL 31  31  29    Creatinine 0.44 - 1.00 mg/dL 9.05  9.21  9.10   Sodium 135 - 145 mmol/L 135  135  135   Potassium 3.5 - 5.1 mmol/L 3.7  3.8  3.7   Chloride 98 - 111 mmol/L 104  103  104   CO2 22 - 32 mmol/L 21  20  19    Calcium  8.9 - 10.3 mg/dL 8.4  8.3  8.5     DG Chest Port 1 View Result Date: 07/23/2024 CLINICAL DATA:  Weakness.  Medication reaction. EXAM: PORTABLE CHEST 1 VIEW COMPARISON:  05/04/2018 FINDINGS: Lower lung volumes from prior exam. Stable heart size allowing for differences in technique. No confluent opacity, pleural effusion or pneumothorax. No pulmonary edema. Chronic right shoulder arthropathy. IMPRESSION: Lower lung volumes from prior exam. No acute findings. Electronically Signed   By: Andrea Gasman M.D.   On: 07/23/2024 23:14     Discharge Instructions:   Discharge Instructions      Kathy Owens,  You were recently admitted to [facility] for  increased stiffness and weakness. It may be due to the new medicine you recently started, called Dantrolene. It also could be worsened by this Herpes virus you are fighting with the Valtrex. It is a good idea to see Dr. Jyl office soon so you can talk with her about your weakness and how it has been getting worse for you.  Continue taking your home medications with the following changes:  Stop taking: Valacyclovir (Valtrex) 1000mg  tablet. You finished the course of this medicine while in the hospital, and do not need to take the pills once you return home. Change how you take: Dantrolene 50mg  tablets. We reduced your dose to 25mg  tablets twice daily to help your spasticity. Continue taking: Acetaminophen  500mg  tablet. Take 2 tablets as needed for headache or fever Alendronate  (Fosamax ) 70mg  tablet. Take one tablet weekly for osteoporosis. Atorvastatin  10mg  tablet, take one tablet daily for your cholesterol. Cyclobenzaprine  (Flexeril ) 5mg  tablet. Take 1 tablet up to 3 times daily for muscle spasms.  Hydrocodone -Acetaminophen  (Norco). Take 1 tablet every 6 hours as needed for pain Loperamide  (Imodium ) 2mg  capsule. Take 1 capsule daily for diarrhea or loose stools Triamterene -hydrochlorothiazide  (Maxzide ) 37.5-25mg  tablet. Take one tablet daily.  You should seek further medical care if you develop any new confusion, any fevers >101F, or any increasingly worse weakness.  We have scheduled an appointment with Dr. Trudy for you to follow up your hospital visit on Thursday 09/08/2024 at 9:15AM. It is important for you to get the shingles vaccine when you follow up with Dr. Trudy. We also recommend that you see Dr. Cornelio in person after your stay at inpatient rehab so she can follow up your strength and see how you are doing.   We are so glad that you are feeling better.  Sincerely, Maddalynn Barnard, DO     Signed: Emelly Wurtz, DO Internal Medicine Resident, PGY-1 08/04/2024,  10:36 AM   Please contact the on call pager after 5 pm and on weekends at [phone].
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
## 2024-08-04 NOTE — Progress Notes (Signed)
   Inpatient Rehabilitation Admissions Coordinator   CIR bed is available to admit her to today. Acute team and TOC made aware. I will make the arrangements.  Heron Leavell, RN, MSN Rehab Admissions Coordinator [phone] 08/04/2024 9:36 AM

## 2024-08-04 NOTE — Progress Notes (Signed)
 PT Cancellation Note  Patient Details Name: Kathy Owens MRN: [id number] DOB: 1956/03/11   Cancelled Treatment:    Reason Eval/Treat Not Completed: Other (comment); currently on bedpan and plans to transition to inpatient rehab after lunch.  Will defer therapies to AIR setting.    Montie Portal 08/04/2024, 12:33 PM Micheline Portal, PT Acute Rehabilitation Services Office:[phone] 08/04/2024

## 2024-08-04 NOTE — Progress Notes (Signed)
    Lovorn, Megan, MD  Physician Physical Medicine and Rehabilitation   Progress Notes    Signed   Date of Service: 07/28/2024  7:11 PM  Related encounter: ED to Hosp-Admission (Discharged) from 07/23/2024 in [place] [facility]   Signed      Show:Clear all [x] Written[x] Templated[] Copied  Added by: [x] Lovorn, Megan, MD  [] Hover for details D/W patient as well as IM resident about pt and MRI results- also personally went over MRI's- independently and results as well   Also called NSU to discuss pt and MRI's.    Asked IM resident for a Neuro consult.  No change in pt's strength on exam today- on average 2-/5 in LE's and also weak in UEs- please see my consult for specifics.   Neuro- MAS of 3- ie the tightest a patient can be, regarding spasticity- it's called the Modified Ashworth scale- used my Rehab and Neuro to grade tightness/tone.    Pt ALSO had severe spasms as soon as I touched her legs- few beats clonus in LE's B/L   D/w pt- flexeril  might help the PAIN of spasms, or tone, but it doesn't TREAT the spasticity very well- it's been used as an adjuvant, but never a primary medicine.    Also d/w pt there's only 4 meds approved for true Upper motor neuron spasticity- that's's Baclofen  which caused her to hallucinate at higher (10 mg) doses; Dantrolene- which was successful at treating spasticity but uncovered her weakness more than she wanted; Valium  which is not appropriate for her due to controlled status and sedation and Zanaflex - which can lower BP, cause sedation in 30-40% of pt's, so I'd rather not use for her.    I feel like pt needs a Neuro consult, because her tone has been severe/progressively worse for past 6 months- and completely stopped walking last 1 month or so so it wasn't due to increased tone from infection - I/e shingles.  Or not soley AND has not improved as her infection is treated, which is common if shingles is the cause of increased Sx's.     Her MRI's are basically not likely the cause of her increased weakness and spasticity/tone-    Pt did agree to take a lower dose of dantrolene- I suggest 25 mg daily to BID initially- was started on 50 mg initially, thje first time a few weeks ago- again did treat her tone- just uncovered her weakness- I don't think  it made her weak, she was already weak, but tone covered it up.    Pt is an appropriate candidate for inpt rehab for 3-4 weeks to improve tone, hopefully make her a little stronger and do power w/c eval, however I think she would benefit best if we had an underlying dx. I am hoping Neuro might have some ideas. I understand pt is complex, but her tone was so mild 6 months ago and allowed her to walk short distances- wasn't even using AFO's or more than RW at that time- was against any assistive devices actually.    I spent a total of 53    minutes on total care today- >50% coordination of care- due to d/w pt at length, the PT in room; IM resident multiple times, NSU and read MRI indep.

## 2024-08-04 NOTE — Progress Notes (Signed)
 Inpatient Rehabilitation Admission Medication Review by a Pharmacist  A complete drug regimen review was completed for this patient to identify any potential clinically significant medication issues.  High Risk Drug Classes Is patient taking? Indication by Medication  Antipsychotic Yes Compazine-N/V  Anticoagulant Yes Xarelto-VTE px  Antibiotic No   Opioid Yes Norco-pain  Antiplatelet No   Hypoglycemics/insulin No   Vasoactive Medication No   Chemotherapy No   Other Yes Acetaminophen -pain Maalox-indigestion Atorvastatin -HLD.  Bisacodyl, fleets-constipation Flexeril -muscle spasms Dantrolene-spasticity Benadryl -itching Guaifenesin/DM-cough Trazodone-sleep     Type of Medication Issue Identified Description of Issue Recommendation(s)  Drug Interaction(s) (clinically significant)     Duplicate Therapy     Allergy     No Medication Administration End Date     Incorrect Dose     Additional Drug Therapy Needed  Maxzide  on hold during acute admit Restart when appropriate  Significant med changes from prior encounter (inform family/care partners about these prior to discharge). Alendronate  for osteoporosis on hold while inpatient Continue to hodl while in CIR and consider restarting once discharged  Other       Clinically significant medication issues were identified that warrant physician communication and completion of prescribed/recommended actions by midnight of the next day:  No  Name of provider notified for urgent issues identified:   Provider Method of Notification:     Pharmacist comments:   Time spent performing this drug regimen review (minutes):  20   Kathy Owens, PharmD, BCPS, FNKF Clinical Pharmacist [place] Please utilize Amion for appropriate phone number to reach the unit pharmacist [facility])  08/04/2024 2:46 PM

## 2024-08-05 ENCOUNTER — Inpatient Hospital Stay (HOSPITAL_COMMUNITY)

## 2024-08-05 DIAGNOSIS — K8020 Calculus of gallbladder without cholecystitis without obstruction: Secondary | ICD-10-CM | POA: Diagnosis not present

## 2024-08-05 DIAGNOSIS — R911 Solitary pulmonary nodule: Secondary | ICD-10-CM | POA: Diagnosis not present

## 2024-08-05 DIAGNOSIS — J479 Bronchiectasis, uncomplicated: Secondary | ICD-10-CM | POA: Diagnosis not present

## 2024-08-05 DIAGNOSIS — N200 Calculus of kidney: Secondary | ICD-10-CM | POA: Diagnosis not present

## 2024-08-05 LAB — COMPREHENSIVE METABOLIC PANEL WITH GFR
ALT: 30 U/L (ref 0–44)
AST: 19 U/L (ref 15–41)
Albumin: 2.8 g/dL — ABNORMAL LOW (ref 3.5–5.0)
Alkaline Phosphatase: 45 U/L (ref 38–126)
Anion gap: 10 (ref 5–15)
BUN: 33 mg/dL — ABNORMAL HIGH (ref 8–23)
CO2: 19 mmol/L — ABNORMAL LOW (ref 22–32)
Calcium: 8.2 mg/dL — ABNORMAL LOW (ref 8.9–10.3)
Chloride: 108 mmol/L (ref 98–111)
Creatinine, Ser: 0.82 mg/dL (ref 0.44–1.00)
GFR, Estimated: >60 mL/min (ref >60)
Glucose, Bld: 107 mg/dL — ABNORMAL HIGH (ref 70–99)
Potassium: 3.8 mmol/L (ref 3.5–5.1)
Sodium: 137 mmol/L (ref 135–145)
Total Bilirubin: 0.3 mg/dL (ref 0.0–1.2)
Total Protein: 5.5 g/dL — ABNORMAL LOW (ref 6.5–8.1)

## 2024-08-05 LAB — CBC WITH DIFFERENTIAL/PLATELET
Abs Immature Granulocytes: 0.05 K/uL (ref 0.00–0.07)
Basophils Absolute: 0 K/uL (ref 0.0–0.1)
Basophils Relative: 1 %
Eosinophils Absolute: 0.4 K/uL (ref 0.0–0.5)
Eosinophils Relative: 6 %
HCT: 33.4 % — ABNORMAL LOW (ref 36.0–46.0)
Hemoglobin: 11 g/dL — ABNORMAL LOW (ref 12.0–15.0)
Immature Granulocytes: 1 %
Lymphocytes Relative: 24 %
Lymphs Abs: 1.5 K/uL (ref 0.7–4.0)
MCH: 29.7 pg (ref 26.0–34.0)
MCHC: 32.9 g/dL (ref 30.0–36.0)
MCV: 90.3 fL (ref 80.0–100.0)
Monocytes Absolute: 0.5 K/uL (ref 0.1–1.0)
Monocytes Relative: 8 %
Neutro Abs: 3.7 K/uL (ref 1.7–7.7)
Neutrophils Relative %: 60 %
Platelets: 214 K/uL (ref 150–400)
RBC: 3.7 MIL/uL — ABNORMAL LOW (ref 3.87–5.11)
RDW: 16.3 % — ABNORMAL HIGH (ref 11.5–15.5)
WBC: 6.2 K/uL (ref 4.0–10.5)
nRBC: 0 % (ref 0.0–0.2)

## 2024-08-05 MED ORDER — GERHARDT'S BUTT CREAM
TOPICAL_CREAM | Freq: Two times a day (BID) | CUTANEOUS | Status: DC
  Administered 2024-08-10: 2 via TOPICAL
  Administered 2024-08-14 – 2024-08-15 (×2): 1 via TOPICAL
  Filled 2024-08-05: qty 60

## 2024-08-05 MED ORDER — BOOST PLUS PO LIQD: 237.0000 mL | Freq: Three times a day (TID) | ORAL | Status: DC

## 2024-08-05 MED ORDER — POLYVINYL ALCOHOL 1.4 % OP SOLN
2.0000 [drp] | OPHTHALMIC | Status: DC | PRN
  Administered 2024-08-05 – 2024-08-06 (×2): 2 [drp] via OPHTHALMIC
  Filled 2024-08-05: qty 15

## 2024-08-05 MED ORDER — BOOST PLUS PO LIQD
237.0000 mL | Freq: Two times a day (BID) | ORAL | Status: DC
  Administered 2024-08-05 – 2024-08-22 (×30): 237 mL via ORAL
  Filled 2024-08-05 (×38): qty 237

## 2024-08-05 MED ORDER — DANTROLENE SODIUM 25 MG PO CAPS
25.0000 mg | ORAL_CAPSULE | Freq: Three times a day (TID) | ORAL | Status: DC
  Administered 2024-08-05 – 2024-08-07 (×7): 25 mg via ORAL
  Filled 2024-08-05 (×8): qty 1

## 2024-08-05 NOTE — Progress Notes (Signed)
 PROGRESS NOTE   Subjective/Complaints:   Pt reports she's afraid Dantrolene will kill her- she kept saying she was joking, but admits she's anxious about all meds for spasticity.  Doesn't want to retry Baclofen  5 mg TID since had hallucinations with 10 mg TID.  Said L eye very dry- wants eye drops.   Objective:   No results found. Recent Labs    08/05/24 0602  WBC 6.2  HGB 11.0*  HCT 33.4*  PLT 214   Recent Labs    08/03/24 0148 08/05/24 0602  NA 135 137  K 3.7 3.8  CL 104 108  CO2 21* 19*  GLUCOSE 112* 107*  BUN 31* 33*  CREATININE 0.94 0.82  CALCIUM  8.4* 8.2*    Intake/Output Summary (Last 24 hours) at 08/05/2024 1847 Last data filed at 08/05/2024 0745 Gross per 24 hour  Intake 240 ml  Output 400 ml  Net -160 ml        Physical Exam: Vital Signs Blood pressure 138/73, pulse 68, temperature 98.1 F (36.7 C), temperature source Oral, resp. rate 17, height 5' (1.524 m), weight 71.6 kg, SpO2 100%.    General: awake, alert, appropriate, sitting up in bed; NAD HENT: conjugate gaze; oropharynx moist; L V1 redness from prior shingles- missing central teeth CV: regular rate and rhythm; no JVD Pulmonary: CTA B/L; no W/R/R- good air movement GI: soft, NT, ND, (+)BS Psychiatric: appropriate, joking a lot but anxious Neurological: spasticity still MAS of 4 in LE's-  Musculoskeletal:     Cervical back: Neck supple.     Comments: UE strength RUE- 4-4+ proximally, Grip 4-/5, FA 3-/5 LUE- Deltoid, biceps, triceps 4/5- WE 4-/5; grip 3+/5 and FA 2/5 RLE- hard to assess due to severe spasticity but appears to be 2-/5 at best LLE- hard to assess due to spasticity- proximally 2-/5 at best; DF 1/5; and PF 1/5  Skin:     Comments: Almost healed herpes zoster in L V1 distribution  Neurological:     Mental Status: She is alert and oriented to person, place, and time.     Comments: MAS of 3-4 in ankles- but still  MAS of 4- but slightly better in hips and knees B/L- hips are worst Chronic mild dysarthria   Assessment/Plan: 1. Functional deficits which require 3+ hours per day of interdisciplinary therapy in a comprehensive inpatient rehab setting. Physiatrist is providing close team supervision and 24 hour management of active medical problems listed below. Physiatrist and rehab team continue to assess barriers to discharge/monitor patient progress toward functional and medical goals  Care Tool:  Bathing    Body parts bathed by patient: Face   Body parts bathed by helper: Right arm, Left arm, Chest, Abdomen, Front perineal area, Buttocks, Right upper leg, Left upper leg, Right lower leg, Left lower leg     Bathing assist Assist Level: 2 Helpers     Upper Body Dressing/Undressing Upper body dressing   What is the patient wearing?: Hospital gown only    Upper body assist Assist Level: Total Assistance - Patient < 25%    Lower Body Dressing/Undressing Lower body dressing      What is the patient wearing?:  Pants     Lower body assist Assist for lower body dressing: 2 Helpers     Toileting Toileting    Toileting assist Assist for toileting: 2 Helpers     Transfers Chair/bed transfer  Transfers assist     Chair/bed transfer assist level: 2 Helpers     Locomotion Ambulation   Ambulation assist      Assist level: Moderate Assistance - Patient 50 - 74% Assistive device: Parallel bars Max distance: 3   Walk 10 feet activity   Assist  Walk 10 feet activity did not occur: Safety/medical concerns        Walk 50 feet activity   Assist Walk 50 feet with 2 turns activity did not occur: Safety/medical concerns         Walk 150 feet activity   Assist Walk 150 feet activity did not occur: Safety/medical concerns         Walk 10 feet on uneven surface  activity   Assist Walk 10 feet on uneven surfaces activity did not occur: Safety/medical  concerns         Wheelchair     Assist Is the patient using a wheelchair?: Yes Type of Wheelchair: Manual    Wheelchair assist level: Dependent - Patient 0% Max wheelchair distance: 150 ft    Wheelchair 50 feet with 2 turns activity    Assist        Assist Level: Dependent - Patient 0%   Wheelchair 150 feet activity     Assist      Assist Level: Dependent - Patient 0%   Blood pressure 138/73, pulse 68, temperature 98.1 F (36.7 C), temperature source Oral, resp. rate 17, height 5' (1.524 m), weight 71.6 kg, SpO2 100%.  Medical Problem List and Plan: 1. Functional deficits secondary to cerebral palsy/ massive increase in spasticity             -patient may  shower             -ELOS/Goals: 3-4 weeks- min-mod A hopefully             First day of evaluations- con't CIR PT and OT  IPOC             Pt will also need a w/c evaluation while here 2.  Antithrombotics: -DVT/anticoagulation:  Pharmaceutical: Xarelto             -antiplatelet therapy: n/a   3. Pain Management: Hx of chronic pain d/t cerebral palsy-continue on home hydrocodone -acetaminophen  5-3 25 as needed   4. Mood/Behavior/Sleep: LCSW to follow for evaluation and support when available.              -antipsychotic agents: n/a   5. Neuropsych/cognition: This patient is capable of making decisions on her own behalf.   6. Skin/Wound Care: routine pressure relief measures.             -monitor lesion over V1 distribution on left-continue Eucerin    7. Fluids/Electrolytes/Nutrition: Monitor I&O with f/u labs in a.m.              -regular diet + Ensure    8. Muscle spacticity/CP: Resumed dantrolene 25 mg BID on 10/10. Flexeril  5 mg TID              - Monitor CBC and CMP             Will titrate dantrolene up tomorrow to TID  10/17- increased Dantrolene to 25 mg TID today-  encouraged pt to take, since her spasticity is so severe.  9.  Zoster/V1 distribution:  Completed 14-day course of Valtrex on  10/11 but would benefit from zoster VAX.  Outpatient follow-up with ophthalmology d/t elevated IOP on 9/27.             - Continue as needed eyedrops    101/7- ordered eye drops to be at bedside.  9. HLD: Lipitor 10 mg    10. Hx of Chronic Diarrhea:  monitor potassium and magnesium labs, continue Imodium  2 mg daily.     11. Cause of spasticity?  10/17- when I got approval to bring pt to CIR, the insurance peer to peer asked that I work up more what might be causing pt's increased spasticity- I ordered CT of Chest, abd and pelvis- and if that is not fruitful, will d/w Neurology if they have any ideas- Peer to peer wanted an LP. CT not resulted yet- does have severe canal stenosis at L1/2 but wouldn't explain her spasticity, just weakness. Per chart, pt refused surgery- will d/w pt again next Monday  12. Azotemia  10/17- will write to push fluids-recheck Monday   I spent a total of  41  minutes on total care today- >50% coordination of care- due to  D/w pt at length about work up- and d/w nursing to push fluids- also review of all imaging and therapy notes, vitals.   LOS: 1 days A FACE TO FACE EVALUATION WAS PERFORMED  Javonnie Illescas 08/05/2024, 6:47 PM

## 2024-08-05 NOTE — Plan of Care (Signed)
  Problem: RH Balance Goal: LTG: Patient will maintain dynamic sitting balance (OT) Description: LTG:  Patient will maintain dynamic sitting balance with assistance during activities of daily living (OT) Flowsheets (Taken 08/05/2024 1555) LTG: Pt will maintain dynamic sitting balance during ADLs with: Supervision/Verbal cueing Goal: LTG Patient will maintain dynamic standing with ADLs (OT) Description: LTG:  Patient will maintain dynamic standing balance with assist during activities of daily living (OT)  Flowsheets (Taken 08/05/2024 1555) LTG: Pt will maintain dynamic standing balance during ADLs with: Minimal Assistance - Patient > 75%   Problem: Sit to Stand Goal: LTG:  Patient will perform sit to stand in prep for activites of daily living with assistance level (OT) Description: LTG:  Patient will perform sit to stand in prep for activites of daily living with assistance level (OT) Flowsheets (Taken 08/05/2024 1555) LTG: PT will perform sit to stand in prep for activites of daily living with assistance level: Supervision/Verbal cueing   Problem: RH Bathing Goal: LTG Patient will bathe all body parts with assist levels (OT) Description: LTG: Patient will bathe all body parts with assist levels (OT) Flowsheets (Taken 08/05/2024 1555) LTG: Pt will perform bathing with assistance level/cueing: Minimal Assistance - Patient > 75%   Problem: RH Dressing Goal: LTG Patient will perform upper body dressing (OT) Description: LTG Patient will perform upper body dressing with assist, with/without cues (OT). Flowsheets (Taken 08/05/2024 1555) LTG: Pt will perform upper body dressing with assistance level of: Supervision/Verbal cueing Goal: LTG Patient will perform lower body dressing w/assist (OT) Description: LTG: Patient will perform lower body dressing with assist, with/without cues in positioning using equipment (OT) Flowsheets (Taken 08/05/2024 1555) LTG: Pt will perform lower body dressing  with assistance level of: Minimal Assistance - Patient > 75%   Problem: RH Toileting Goal: LTG Patient will perform toileting task (3/3 steps) with assistance level (OT) Description: LTG: Patient will perform toileting task (3/3 steps) with assistance level (OT)  Flowsheets (Taken 08/05/2024 1555) LTG: Pt will perform toileting task (3/3 steps) with assistance level: Minimal Assistance - Patient > 75%   Problem: RH Toilet Transfers Goal: LTG Patient will perform toilet transfers w/assist (OT) Description: LTG: Patient will perform toilet transfers with assist, with/without cues using equipment (OT) Flowsheets (Taken 08/05/2024 1555) LTG: Pt will perform toilet transfers with assistance level of: Minimal Assistance - Patient > 75%   Problem: RH Tub/Shower Transfers Goal: LTG Patient will perform tub/shower transfers w/assist (OT) Description: LTG: Patient will perform tub/shower transfers with assist, with/without cues using equipment (OT) Flowsheets (Taken 08/05/2024 1555) LTG: Pt will perform tub/shower stall transfers with assistance level of: Minimal Assistance - Patient > 75%
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
## 2024-08-05 NOTE — IPOC Note (Signed)
 Overall Plan of Care [facility]) Patient Details Name: Kathy Owens MRN: [id number] DOB: 26-Jun-1956  Admitting Diagnosis: Cerebral palsy, diplegic [facility])  Hospital Problems: Principal Problem:   Cerebral palsy, diplegic (HCC) Active Problems:   Muscle spasticity   History of progressive weakness     Functional Problem List: Nursing Bladder, Bowel, Edema, Endurance, Medication Management, Nutrition, Pain, Safety, Skin Integrity  PT Balance, Edema, Safety, Endurance, Sensory, Motor, Skin Integrity  OT Balance, Endurance, Motor, Safety, Skin Integrity, Perception  SLP    TR         Basic ADL's: OT Eating, Grooming, Bathing, Dressing, Toileting     Advanced  ADL's: OT       Transfers: PT Bed Mobility, Bed to Chair, Customer service manager, Tub/Shower     Locomotion: PT Ambulation, Wheelchair Mobility     Additional Impairments: OT Fuctional Use of Upper Extremity  SLP        TR      Anticipated Outcomes Item Anticipated Outcome  Self Feeding setup  Swallowing      Basic self-care  setup  Toileting  min A   Bathroom Transfers min A  Bowel/Bladder  manage bowels with medications/ manage bladder with toileting assistance  Transfers  CGA with LRAD  Locomotion  supervision, likely PWC  Communication     Cognition     Pain  <4 w/ prns  Safety/Judgment  manage safety with min-mod assistance   Therapy Plan: PT Intensity: Minimum of 1-2 x/day ,45 to 90 minutes PT Frequency: 5 out of 7 days PT Duration Estimated Length of Stay: ~3 weeks OT Intensity: Minimum of 1-2 x/day, 45 to 90 minutes OT Frequency: 5 out of 7 days OT Duration/Estimated Length of Stay: ~3 weeks     Team Interventions: Nursing Interventions Patient/Family Education, Medication Management, Bladder Management, Bowel Management, Disease Management/Prevention, Pain Management, Discharge Planning, Skin Care/Wound Management  PT interventions Cognitive remediation/compensation, Ambulation/gait  training, Discharge planning, DME/adaptive equipment instruction, Functional mobility training, Pain management, Psychosocial support, Splinting/orthotics, Therapeutic Activities, UE/LE Strength taining/ROM, Visual/perceptual remediation/compensation, Wheelchair propulsion/positioning, UE/LE Coordination activities, Therapeutic Exercise, Stair training, Skin care/wound management, Patient/family education, Neuromuscular re-education, Functional electrical stimulation, Disease management/prevention, Firefighter, Warden/ranger  OT Interventions Warden/ranger, Discharge planning, Functional electrical stimulation, Pain management, Self Care/advanced ADL retraining, Therapeutic Activities, UE/LE Coordination activities, Disease mangement/prevention, Functional mobility training, Patient/family education, Skin care/wound managment, Therapeutic Exercise, Community reintegration, Fish farm manager, Neuromuscular re-education, Psychosocial support, Splinting/orthotics, UE/LE Strength taining/ROM, Wheelchair propulsion/positioning  SLP Interventions    TR Interventions    SW/CM Interventions Discharge Planning, Psychosocial Support, Patient/Family Education   Barriers to Discharge MD  Medical stability, Home enviroment access/loayout, Incontinence, Neurogenic bowel and bladder, Lack of/limited family support, Weight, Medication compliance, and severe spasticity  Nursing Decreased caregiver support, Incontinence Discharge: Apartment  Discharge Home Layout: One level  Discharge Home Access: Level entry; sister has been staying with her 24/7 over past month, apartment management is [employer]  PT Decreased caregiver support, Home environment access/layout    OT Home environment access/layout, Decreased caregiver support    SLP      SW Decreased caregiver support, Lack of/limited family support, Community education officer for SNF coverage     Team Discharge  Planning: Destination: PT-Home ,OT- Home , SLP-  Projected Follow-up: PT-Home health PT, OT-  Outpatient OT, SLP-  Projected Equipment Needs: PT-To be determined, OT- To be determined, SLP-  Equipment Details: PT-likely PWC, OT-  Patient/family involved in discharge planning: PT- Patient,  OT-Patient, SLP-  MD ELOS: ~ 3 weeks Medical Rehab Prognosis:  Good Assessment: The patient has been admitted for CIR therapies with the diagnosis of CP with severe spasticity- unknown reason and severe canal stenosis at L1/2. The team will be addressing functional mobility, strength, stamina, balance, safety, adaptive techniques and equipment, self-care, bowel and bladder mgt, patient and caregiver education, spasticity mgmt. Goals have been set at SBA to min A. Anticipated discharge destination is home with sister.        See Team Conference Notes for weekly updates to the plan of care

## 2024-08-05 NOTE — Progress Notes (Signed)
 Physical Therapy Session Note  Patient Details  Name: Kathy Owens MRN: [id number] Date of Birth: [birth year]  Today's Date: 08/05/2024 PT Individual Time: 1430-1530 PT Individual Time Calculation (min): 60 min   Short Term Goals: Week 1:  PT Short Term Goal 1 (Week 1): pt will initiate standing with RW PT Short Term Goal 2 (Week 1): Pt will initiate SPT PT Short Term Goal 3 (Week 1): Pt will tolerate standing >5 min for tone management  Skilled Therapeutic Interventions/Progress Updates:  pt received in bed and agreeable to therapy. No complaint of pain. Pt able to initiate bed mobility and move BLE ~75% off bed, but then required max a for trunk elevation d/t extensor tone/posterior lean. Min a to maintain sitting EOB while placing stedy. Min a to stand to stedy.  Pt then performed 3 x 5 Sit to stand from stedy perch with emphasis on weight shift and hip extension.  Rest breaks in perch to promote weight bearing for tone management. Pt transported to therapy gym for time management and energy conservation. Exchanged and adjusted leg rest to more appropriate length.  Pt then set up in front of standing frame and performed 2 x 5 anterior leans to reach forward for blue pad and then up to table to improve ability to lean forward and reduce posterior lean while sitting unsupported. Pt returned to room and then to returned to bed in the same manner, was left with all needs in reach and alarm active. Prevalons in place.     Therapy Documentation Precautions:  Precautions Precautions: Fall Recall of Precautions/Restrictions: Intact Precaution/Restrictions Comments: hx of CP with spasticity Restrictions Weight Bearing Restrictions Per Provider Order: No General:      Therapy/Group: Individual Therapy  [name] 08/05/2024, 2:43 PM

## 2024-08-05 NOTE — Progress Notes (Signed)
 Met with patient to review current situation, team conference and plan of care. Reviewed spasticity, medications,bladder incontinence and bowel incontinence. Reviewed that we will be placing cream on her peri/buttocks area due to MASD. Continue to monitor to monitor to rovide educational needs to facilitate preparation for discharge.

## 2024-08-05 NOTE — Care Management (Signed)
 Inpatient Rehabilitation Center Individual Statement of Services  Patient Name:  Kathy Owens  Date:  08/05/2024  Welcome to the Inpatient Rehabilitation Center.  Our goal is to provide you with an individualized program based on your diagnosis and situation, designed to meet your specific needs.  With this comprehensive rehabilitation program, you will be expected to participate in at least 3 hours of rehabilitation therapies Monday-Friday, with modified therapy programming on the weekends.  Your rehabilitation program will include the following services:  Physical Therapy (PT), Occupational Therapy (OT), 24 hour per day rehabilitation nursing, Therapeutic Recreaction (TR), Psychology, Neuropsychology, Care Coordinator, Rehabilitation Medicine, Nutrition Services, Pharmacy Services, and Other  Weekly team conferences will be held on Tuesday to discuss your progress.  Your Inpatient Rehabilitation Care Coordinator will talk with you frequently to get your input and to update you on team discussions.  Team conferences with you and your family in attendance may also be held.  Expected length of stay: ~3 weeks    Overall anticipated outcome: Minimal Assistance  Depending on your progress and recovery, your program may change. Your Inpatient Rehabilitation Care Coordinator will coordinate services and will keep you informed of any changes. Your Inpatient Rehabilitation Care Coordinator's name and contact numbers are listed  below.  The following services may also be recommended but are not provided by the Inpatient Rehabilitation Center:  Driving Evaluations Home Health Rehabiltiation Services Outpatient Rehabilitation Services Vocational Rehabilitation   Arrangements will be made to provide these services after discharge if needed.  Arrangements include referral to agencies that provide these services.  Your insurance has been verified to be:  [facility] Medicare  Your primary doctor is:  Mliss Pouch  Pertinent information will be shared with your doctor and your insurance company.  Inpatient Rehabilitation Care Coordinator:  Graeme Jude, KEN [phone] or [phone]  Information discussed with and copy given to patient by: [name], 08/05/2024, 4:49 PM
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
## 2024-08-05 NOTE — Progress Notes (Signed)
 Inpatient Rehabilitation  Patient information reviewed and entered into eRehab system by [facility]. Karen Kays., CCC/SLP, PPS Coordinator.  Information including medical coding, functional ability and quality indicators will be reviewed and updated through discharge.

## 2024-08-06 DIAGNOSIS — R7989 Other specified abnormal findings of blood chemistry: Secondary | ICD-10-CM

## 2024-08-06 DIAGNOSIS — M62838 Other muscle spasm: Secondary | ICD-10-CM

## 2024-08-06 MED ORDER — POLYVINYL ALCOHOL 1.4 % OP SOLN
2.0000 [drp] | Freq: Every day | OPHTHALMIC | Status: DC
  Administered 2024-08-06 – 2024-08-18 (×54): 2 [drp] via OPHTHALMIC
  Filled 2024-08-06 (×2): qty 15

## 2024-08-06 NOTE — Progress Notes (Signed)
 Occupational Therapy Session Note  Patient Details  Name: Kathy Owens MRN: [id number] Date of Birth: October 11, 1956  Today's Date: 08/06/2024 OT Individual Time: 0900-1010 OT Individual Time Calculation (min): 70 min    Short Term Goals: Week 1:  OT Short Term Goal 1 (Week 1): PT will don shirt with mod A in unsupported sitting OT Short Term Goal 2 (Week 1): Pt will transfer with the RW with mod A to the toilet. OT Short Term Goal 3 (Week 1): Pt don LB clothing with mod A OT Short Term Goal 4 (Week 1): Pt will performed supine to sitting EOB with mod A  Skilled Therapeutic Interventions/Progress Updates:    Pt resting in bed upon arrival with MD present. Skilled OT intervention with focus on bed mobility, siting balance EOB, sit<>stand and transfer with Stedy, bathing and dressing with sit<>stand from w/c with Stedy, and activity tolerance to increase independence with BADLs. Pt with significant extensor tone requiring +2 to assist with supine>sit EOB and sitting balance at EOB. Pt required assistance with placement of BLE on Stedy but able to pull to upright with CGA when pulling up. Bathing at w/c level with tot A. LB dressing with pants at +2 standing in [place]. Dependent for donning hospital gown. Pt remained in TIS w/c with all needs within reach. Belt alarm activated.   Therapy Documentation Precautions:  Precautions Precautions: Fall Recall of Precautions/Restrictions: Intact Precaution/Restrictions Comments: hx of CP with spasticity Restrictions Weight Bearing Restrictions Per Provider Order: No   Pain:  Pt c/o Lt eye irritation; MD aware and addressed   Therapy/Group: Individual Therapy  [name] 08/06/2024, 10:11 AM

## 2024-08-06 NOTE — Progress Notes (Signed)
 Occupational Therapy Session Note  Patient Details  Name: Kathy Owens MRN: [id number] Date of Birth: 05-18-1956  Today's Date: 08/06/2024 OT Individual Time: 1120-1200 OT Individual Time Calculation (min): 40 min    Short Term Goals: Week 1:  OT Short Term Goal 1 (Week 1): PT will don shirt with mod A in unsupported sitting OT Short Term Goal 2 (Week 1): Pt will transfer with the RW with mod A to the toilet. OT Short Term Goal 3 (Week 1): Pt don LB clothing with mod A OT Short Term Goal 4 (Week 1): Pt will performed supine to sitting EOB with mod A  Skilled Therapeutic Interventions/Progress Updates:    Skilled OT intervention with focus on sit<>stand in [place], standing balance, lateral scooting in w/c, and activity tolerance to increase independence with BADLs. Pt pulls up in [place] with CGA. CGA for standing balance in [place] but requires BUE support to maintain. Sit<>stand x 4. W/c cushion replaced with Roho (pt c/o buttocks discomfort but no skin breakdown noted.) Pt initiated scooting back into w/c but BLE extensor tone inhibits pt's ability to push with BLE when repositioning in w/c. Tot A for scooting back into w/c. Pt remained in TIS w/c with all needs within reach. Belt alarm activated.   Therapy Documentation Precautions:  Precautions Precautions: Fall Recall of Precautions/Restrictions: Intact Precaution/Restrictions Comments: hx of CP with spasticity Restrictions Weight Bearing Restrictions Per Provider Order: No   Pain:  Pt denies pain this morning   Therapy/Group: Individual Therapy  [name] 08/06/2024, 12:13 PM

## 2024-08-06 NOTE — Progress Notes (Signed)
 PROGRESS NOTE   Subjective/Complaints:  Pt with irritation in left eye related to shingles. Asked about results of her CT which we discussed  ROS: Patient denies fever, rash, sore throat, blurred vision, dizziness, nausea, vomiting, diarrhea, cough, shortness of breath or chest pain, joint or back/neck pain, headache, or mood change.   Objective:   CT CHEST ABDOMEN PELVIS WO CONTRAST Result Date: 08/06/2024 EXAM: CT CHEST, ABDOMEN AND PELVIS WITHOUT CONTRAST 08/05/2024 11:46:50 AM TECHNIQUE: CT of the chest, abdomen and pelvis was performed without the administration of intravenous contrast. Multiplanar reformatted images are provided for review. Automated exposure control, iterative reconstruction, and/or weight based adjustment of the mA/kV was utilized to reduce the radiation dose to as low as reasonably achievable. COMPARISON: CT abdomen and pelvis 03/27/2024. CLINICAL HISTORY: Weight loss, unintended; Pt is a pt with Mild Cerebral palsy- that had mild spasticity- now it's SO bad that she cannot move legs and getting weaker- w/u for spine (-)- trying to figure out cause. FINDINGS: CHEST: MEDIASTINUM AND LYMPH NODES: Heart and pericardium are unremarkable. The central airways are clear. No mediastinal, hilar or axillary lymphadenopathy. LUNGS AND PLEURA: Scarring and mild bronchiolectasis in the lower lobes. Subpleural nodule in the posterior right upper lobe measuring 8 mm (series 6 image 33). No focal consolidation or pulmonary edema. No pleural effusion or pneumothorax. ABDOMEN AND PELVIS: LIVER: The liver is unremarkable. GALLBLADDER AND BILE DUCTS: Cholelithiasis without evidence of acute cholecystitis. No biliary ductal dilatation. SPLEEN: No acute abnormality. PANCREAS: No acute abnormality. ADRENAL GLANDS: No acute abnormality. KIDNEYS, URETERS AND BLADDER: Cystic lesion in the posterior left kidney is unchanged from 2020 and likely a  benign cyst. No follow up recommended. Punctate nonobstructing stone in the left kidney parenchymal calcification. No hydronephrosis. No perinephric or periureteral stranding. Urinary bladder is unremarkable. GI AND BOWEL: Stomach demonstrates no acute abnormality. There is no bowel obstruction. REPRODUCTIVE ORGANS: No acute abnormality. PERITONEUM AND RETROPERITONEUM: No ascites. No free air. VASCULATURE: Aorta is normal in caliber. Aortic atherosclerotic calcification. ABDOMINAL AND PELVIS LYMPH NODES: No lymphadenopathy. BONES AND SOFT TISSUES: Right hip arthroplasty. Streak artifact from the right hip arthroplasty obscures the right pelvis. No focal soft tissue abnormality. IMPRESSION: 1. Solitary 8 mm solid subpleural right upper lobe pulmonary nodule. According to the Fleischner Society pulmonary nodule recommendations, the recommendation is CT at 6-12 months, then consider CT at 18-24 months (for low-risk patients); for high-risk patients CT is recommended at 6-12 months, and again at 18-24 months. 2. No acute findings. Electronically signed by: Norman Gatlin MD 08/06/2024 01:12 AM EDT RP Workstation: [id number]   Recent Labs    08/05/24 0602  WBC 6.2  HGB 11.0*  HCT 33.4*  PLT 214   Recent Labs    08/05/24 0602  NA 137  K 3.8  CL 108  CO2 19*  GLUCOSE 107*  BUN 33*  CREATININE 0.82  CALCIUM  8.2*    Intake/Output Summary (Last 24 hours) at 08/06/2024 1746 Last data filed at 08/06/2024 1230 Gross per 24 hour  Intake 716 ml  Output --  Net 716 ml        Physical Exam: Vital Signs Blood pressure 123/60, pulse 70, temperature 98.7  F (37.1 C), temperature source Oral, resp. rate 17, height 5' (1.524 m), weight 71.6 kg, SpO2 99%.    Constitutional: No distress . Vital signs reviewed. HEENT: NCAT, EOMI, oral membranes moist. Left eye irritated along path of her shingles Neck: supple Cardiovascular: RRR without murmur. No JVD    Respiratory/Chest: CTA Bilaterally without  wheezes or rales. Normal effort    GI/Abdomen: BS +, non-tender, non-distended Ext: no clubbing, cyanosis, or edema Psych: pleasant and cooperative, a little anxious  Neurological: spasticity still MAS of 4 in LE's-  Musculoskeletal:     Cervical back: Neck supple.     Comments: UE strength RUE- 4-4+ proximally, Grip 4-/5, FA 3-/5 LUE- Deltoid, biceps, triceps 4/5- WE 4-/5; grip 3+/5 and FA 2/5 RLE- hard to assess due to severe spasticity but appears to be 2-/5 at best LLE- hard to assess due to spasticity- proximally 2-/5 at best; DF 1/5; and PF 1/5  Skin:     Comments: Almost healed herpes zoster in L V1 distribution  Neurological:     Mental Status: She is alert and oriented to person, place, and time.     Comments: MAS of 3-4 in ankles- but still MAS of 4- but slightly better in hips and knees B/L- hips are worst Chronic mild dysarthria  Prior neuro assessment is c/w 08/06/2024 exam.   Assessment/Plan: 1. Functional deficits which require 3+ hours per day of interdisciplinary therapy in a comprehensive inpatient rehab setting. Physiatrist is providing close team supervision and 24 hour management of active medical problems listed below. Physiatrist and rehab team continue to assess barriers to discharge/monitor patient progress toward functional and medical goals  Care Tool:  Bathing    Body parts bathed by patient: Face, Chest, Abdomen   Body parts bathed by helper: Right arm, Left arm, Front perineal area, Buttocks, Right upper leg, Left upper leg, Right lower leg, Left lower leg     Bathing assist Assist Level: Total Assistance - Patient < 25%     Upper Body Dressing/Undressing Upper body dressing   What is the patient wearing?: Hospital gown only    Upper body assist Assist Level: Total Assistance - Patient < 25%    Lower Body Dressing/Undressing Lower body dressing      What is the patient wearing?: Pants     Lower body assist Assist for lower body dressing:  2 Helpers     Toileting Toileting    Toileting assist Assist for toileting: 2 Helpers     Transfers Chair/bed transfer  Transfers assist     Chair/bed transfer assist level: Dependent - mechanical lift     Locomotion Ambulation   Ambulation assist      Assist level: Moderate Assistance - Patient 50 - 74% Assistive device: Parallel bars Max distance: 3   Walk 10 feet activity   Assist  Walk 10 feet activity did not occur: Safety/medical concerns        Walk 50 feet activity   Assist Walk 50 feet with 2 turns activity did not occur: Safety/medical concerns         Walk 150 feet activity   Assist Walk 150 feet activity did not occur: Safety/medical concerns         Walk 10 feet on uneven surface  activity   Assist Walk 10 feet on uneven surfaces activity did not occur: Safety/medical concerns         Wheelchair     Assist Is the patient using a wheelchair?: Yes  Type of Wheelchair: Manual    Wheelchair assist level: Dependent - Patient 0% Max wheelchair distance: 150 ft    Wheelchair 50 feet with 2 turns activity    Assist        Assist Level: Dependent - Patient 0%   Wheelchair 150 feet activity     Assist      Assist Level: Dependent - Patient 0%   Blood pressure 123/60, pulse 70, temperature 98.7 F (37.1 C), temperature source Oral, resp. rate 17, height 5' (1.524 m), weight 71.6 kg, SpO2 99%.  Medical Problem List and Plan: 1. Functional deficits secondary to cerebral palsy/ massive increase in spasticity             -patient may  shower             -ELOS/Goals: 3-4 weeks- min-mod A hopefully             -Continue CIR therapies including PT, OT              Pt will also need a w/c evaluation while here 2.  Antithrombotics: -DVT/anticoagulation:  Pharmaceutical: Xarelto             -antiplatelet therapy: n/a   3. Pain Management: Hx of chronic pain d/t cerebral palsy-continue on home  hydrocodone -acetaminophen  5-3 25 as needed   4. Mood/Behavior/Sleep: LCSW to follow for evaluation and support when available.              -antipsychotic agents: n/a   5. Neuropsych/cognition: This patient is capable of making decisions on her own behalf.   6. Skin/Wound Care: routine pressure relief measures.             -monitor lesion over V1 distribution on left-continue Eucerin    7. Fluids/Electrolytes/Nutrition: Monitor I&O with f/u labs in a.m.              -regular diet + Ensure    8. Muscle spacticity/CP: Resumed dantrolene 25 mg BID on 10/10. Flexeril  5 mg TID              - Monitor CBC and CMP             Will titrate dantrolene up tomorrow to TID  10/17- increased Dantrolene to 25 mg TID today- encouraged pt to take, since her spasticity is so severe.   10/18-- no significant change at this point. Consider adjustment tomorrow or Monday  9.  Zoster/V1 distribution:  Completed 14-day course of Valtrex on 10/11 but would benefit from zoster VAX.  Outpatient follow-up with ophthalmology d/t elevated IOP on 9/27.             - Continue as needed eyedrops    101/7- ordered eye drops to be at bedside.  9. HLD: Lipitor 10 mg    10. Hx of Chronic Diarrhea:  monitor potassium and magnesium labs, continue Imodium  2 mg daily.     11. Cause of spasticity?  10/17- when I got approval to bring pt to CIR, the insurance peer to peer asked that I work up more what might be causing pt's increased spasticity- I ordered CT of Chest, abd and pelvis- and if that is not fruitful, will d/w Neurology if they have any ideas- Peer to peer wanted an LP. CT not resulted yet- does have severe canal stenosis at L1/2 but wouldn't explain her spasticity, just weakness. Per chart, pt refused surgery- will d/w pt again next Monday  10/18 CT of abdomen and pelvis  really unremarkable. She has one 8mm nodule in RUL of lung with follow up recommended in ~6-12 mos  12. Azotemia  10/17- will write to push  fluids-recheck Monday     LOS: 2 days A FACE TO FACE EVALUATION WAS PERFORMED  Arthea ONEIDA Gunther 08/06/2024, 5:46 PM

## 2024-08-07 MED ORDER — DANTROLENE SODIUM 25 MG PO CAPS
25.0000 mg | ORAL_CAPSULE | Freq: Four times a day (QID) | ORAL | Status: DC
  Administered 2024-08-07 – 2024-08-09 (×11): 25 mg via ORAL
  Filled 2024-08-07 (×13): qty 1

## 2024-08-07 NOTE — Progress Notes (Addendum)
 PROGRESS NOTE   Subjective/Complaints:  Pt still with eye irritation. Eye gtts help. Doesn't notice much change with dantrolene, tolerating it ok  ROS: Patient denies fever, rash, sore throat, blurred vision, dizziness, nausea, vomiting, diarrhea, cough, shortness of breath or chest pain, joint or back/neck pain, headache, or mood change. .   Objective:   CT CHEST ABDOMEN PELVIS WO CONTRAST Result Date: 08/06/2024 EXAM: CT CHEST, ABDOMEN AND PELVIS WITHOUT CONTRAST 08/05/2024 11:46:50 AM TECHNIQUE: CT of the chest, abdomen and pelvis was performed without the administration of intravenous contrast. Multiplanar reformatted images are provided for review. Automated exposure control, iterative reconstruction, and/or weight based adjustment of the mA/kV was utilized to reduce the radiation dose to as low as reasonably achievable. COMPARISON: CT abdomen and pelvis 03/27/2024. CLINICAL HISTORY: Weight loss, unintended; Pt is a pt with Mild Cerebral palsy- that had mild spasticity- now it's SO bad that she cannot move legs and getting weaker- w/u for spine (-)- trying to figure out cause. FINDINGS: CHEST: MEDIASTINUM AND LYMPH NODES: Heart and pericardium are unremarkable. The central airways are clear. No mediastinal, hilar or axillary lymphadenopathy. LUNGS AND PLEURA: Scarring and mild bronchiolectasis in the lower lobes. Subpleural nodule in the posterior right upper lobe measuring 8 mm (series 6 image 33). No focal consolidation or pulmonary edema. No pleural effusion or pneumothorax. ABDOMEN AND PELVIS: LIVER: The liver is unremarkable. GALLBLADDER AND BILE DUCTS: Cholelithiasis without evidence of acute cholecystitis. No biliary ductal dilatation. SPLEEN: No acute abnormality. PANCREAS: No acute abnormality. ADRENAL GLANDS: No acute abnormality. KIDNEYS, URETERS AND BLADDER: Cystic lesion in the posterior left kidney is unchanged from 2020 and  likely a benign cyst. No follow up recommended. Punctate nonobstructing stone in the left kidney parenchymal calcification. No hydronephrosis. No perinephric or periureteral stranding. Urinary bladder is unremarkable. GI AND BOWEL: Stomach demonstrates no acute abnormality. There is no bowel obstruction. REPRODUCTIVE ORGANS: No acute abnormality. PERITONEUM AND RETROPERITONEUM: No ascites. No free air. VASCULATURE: Aorta is normal in caliber. Aortic atherosclerotic calcification. ABDOMINAL AND PELVIS LYMPH NODES: No lymphadenopathy. BONES AND SOFT TISSUES: Right hip arthroplasty. Streak artifact from the right hip arthroplasty obscures the right pelvis. No focal soft tissue abnormality. IMPRESSION: 1. Solitary 8 mm solid subpleural right upper lobe pulmonary nodule. According to the Fleischner Society pulmonary nodule recommendations, the recommendation is CT at 6-12 months, then consider CT at 18-24 months (for low-risk patients); for high-risk patients CT is recommended at 6-12 months, and again at 18-24 months. 2. No acute findings. Electronically signed by: Norman Gatlin MD 08/06/2024 01:12 AM EDT RP Workstation: [id number]   Recent Labs    08/05/24 0602  WBC 6.2  HGB 11.0*  HCT 33.4*  PLT 214   Recent Labs    08/05/24 0602  NA 137  K 3.8  CL 108  CO2 19*  GLUCOSE 107*  BUN 33*  CREATININE 0.82  CALCIUM  8.2*    Intake/Output Summary (Last 24 hours) at 08/07/2024 1105 Last data filed at 08/06/2024 2052 Gross per 24 hour  Intake 712 ml  Output --  Net 712 ml        Physical Exam: Vital Signs Blood pressure 125/62, pulse 81, temperature 98.7  F (37.1 C), temperature source Oral, resp. rate 18, height 5' (1.524 m), weight 71.6 kg, SpO2 100%.    Constitutional: No distress . Vital signs reviewed. HEENT: NCAT, EOMI, oral membranes moist Neck: supple Cardiovascular: RRR without murmur. No JVD    Respiratory/Chest: CTA Bilaterally without wheezes or rales. Normal effort     GI/Abdomen: BS +, non-tender, non-distended Ext: no clubbing, cyanosis, or edema Psych: pleasant and cooperative   Neurological: spasticity still MAS of 4 in LE's- even touching limbs sets them off Musculoskeletal:     Cervical back: Neck supple.      Comments: UE strength RUE- 4-4+ proximally, Grip 4-/5, FA 3-/5 LUE- Deltoid, biceps, triceps 4/5- WE 4-/5; grip 3+/5 and FA 2/5 RLE- hard to assess due to severe spasticity but appears to be 2-/5 at best LLE- hard to assess due to spasticity- proximally 2-/5 at best; DF 1/5; and PF 1/5  Skin:     Comments: Almost healed herpes zoster in L V1 distribution  Neurological:     Mental Status: She is alert and oriented to person, place, and time.     Comments: MAS of 3-4 in ankles- but still MAS of 4- but slightly better in hips and knees B/L- hips are worst Chronic mild dysarthria  Prior neuro assessment is c/w 08/07/2024 exam.   Assessment/Plan: 1. Functional deficits which require 3+ hours per day of interdisciplinary therapy in a comprehensive inpatient rehab setting. Physiatrist is providing close team supervision and 24 hour management of active medical problems listed below. Physiatrist and rehab team continue to assess barriers to discharge/monitor patient progress toward functional and medical goals  Care Tool:  Bathing    Body parts bathed by patient: Face, Chest, Abdomen   Body parts bathed by helper: Right arm, Left arm, Front perineal area, Buttocks, Right upper leg, Left upper leg, Right lower leg, Left lower leg     Bathing assist Assist Level: Total Assistance - Patient < 25%     Upper Body Dressing/Undressing Upper body dressing   What is the patient wearing?: Hospital gown only    Upper body assist Assist Level: Total Assistance - Patient < 25%    Lower Body Dressing/Undressing Lower body dressing      What is the patient wearing?: Pants     Lower body assist Assist for lower body dressing: 2 Helpers      Toileting Toileting    Toileting assist Assist for toileting: 2 Helpers     Transfers Chair/bed transfer  Transfers assist     Chair/bed transfer assist level: Dependent - mechanical lift     Locomotion Ambulation   Ambulation assist      Assist level: Moderate Assistance - Patient 50 - 74% Assistive device: Parallel bars Max distance: 3   Walk 10 feet activity   Assist  Walk 10 feet activity did not occur: Safety/medical concerns        Walk 50 feet activity   Assist Walk 50 feet with 2 turns activity did not occur: Safety/medical concerns         Walk 150 feet activity   Assist Walk 150 feet activity did not occur: Safety/medical concerns         Walk 10 feet on uneven surface  activity   Assist Walk 10 feet on uneven surfaces activity did not occur: Safety/medical concerns         Wheelchair     Assist Is the patient using a wheelchair?: Yes Type of Wheelchair: Manual  Wheelchair assist level: Dependent - Patient 0% Max wheelchair distance: 150 ft    Wheelchair 50 feet with 2 turns activity    Assist        Assist Level: Dependent - Patient 0%   Wheelchair 150 feet activity     Assist      Assist Level: Dependent - Patient 0%   Blood pressure 125/62, pulse 81, temperature 98.7 F (37.1 C), temperature source Oral, resp. rate 18, height 5' (1.524 m), weight 71.6 kg, SpO2 100%.  Medical Problem List and Plan: 1. Functional deficits secondary to cerebral palsy/ massive increase in spasticity             -patient may  shower             -ELOS/Goals: 3-4 weeks- min-mod A hopefully            -Continue CIR therapies including PT, OT               Pt will also need a w/c evaluation while here 2.  Antithrombotics: -DVT/anticoagulation:  Pharmaceutical: Xarelto             -antiplatelet therapy: n/a   3. Pain Management: Hx of chronic pain d/t cerebral palsy-continue on home hydrocodone -acetaminophen  5-3 25  as needed   4. Mood/Behavior/Sleep: LCSW to follow for evaluation and support when available.              -antipsychotic agents: n/a   5. Neuropsych/cognition: This patient is capable of making decisions on her own behalf.   6. Skin/Wound Care: routine pressure relief measures.             -monitor lesion over V1 distribution on left-continue Eucerin    7. Fluids/Electrolytes/Nutrition: Monitor I&O with f/u labs in a.m.              -regular diet + Ensure    8. Muscle spacticity/CP: Resumed dantrolene 25 mg BID on 10/10. Flexeril  5 mg TID              - Monitor CBC and CMP             Will titrate dantrolene up tomorrow to TID  10/17- increased Dantrolene to 25 mg TID today- encouraged pt to take, since her spasticity is so severe.   10/18-19-- no significant change at this point. Increase dantrium to 25mg  qid. Pt is on board. Will need further titration if tolerated.  9.  Zoster/V1 distribution:  Completed 14-day course of Valtrex on 10/11 but would benefit from zoster VAX.  Outpatient follow-up with ophthalmology d/t elevated IOP on 9/27.             - Continue as needed eyedrops    101/7- ordered eye drops to be at bedside.  9. HLD: Lipitor 10 mg    10. Hx of Chronic Diarrhea:  monitor potassium and magnesium labs, continue Imodium  2 mg daily.     11. Cause of spasticity?  10/17- when I got approval to bring pt to CIR, the insurance peer to peer asked that I work up more what might be causing pt's increased spasticity- I ordered CT of Chest, abd and pelvis- and if that is not fruitful, will d/w Neurology if they have any ideas- Peer to peer wanted an LP. CT not resulted yet- does have severe canal stenosis at L1/2 but wouldn't explain her spasticity, just weakness. Per chart, pt refused surgery- will d/w pt again next Monday  10/18 CT of  abdomen and pelvis really unremarkable. She has one 8mm nodule in RUL of lung with follow up recommended in ~6-12 mos  12. Azotemia  10/17- will  write to push fluids-recheck labs Monday  10/19 pt with hx of CKD III per chart.    -may not be too far from baseline   -we discussed pushing fluids today--labs tomorrow    LOS: 3 days A FACE TO FACE EVALUATION WAS PERFORMED  Arthea ONEIDA Gunther 08/07/2024, 11:05 AM

## 2024-08-07 NOTE — Progress Notes (Signed)
 Occupational Therapy Session Note  Patient Details  Name: Kathy Owens MRN: [id number] Date of Birth: 08/26/1956  Today's Date: 08/07/2024 OT Individual Time: 0900-1000 OT Individual Time Calculation (min): 60 min    Short Term Goals: Week 1:  OT Short Term Goal 1 (Week 1): PT will don shirt with mod A in unsupported sitting OT Short Term Goal 2 (Week 1): Pt will transfer with the RW with mod A to the toilet. OT Short Term Goal 3 (Week 1): Pt don LB clothing with mod A OT Short Term Goal 4 (Week 1): Pt will performed supine to sitting EOB with mod A  Skilled Therapeutic Interventions/Progress Updates:      Therapy Documentation Precautions:  Precautions Precautions: Fall Recall of Precautions/Restrictions: Intact Precaution/Restrictions Comments: hx of CP with spasticity Restrictions Weight Bearing Restrictions Per Provider Order: No General: Pt supine in bed upon OT arrival, agreeable to OT session.  Pain:  4/10 pain reported in generalized pain, activity, intermittent rest breaks, distractions provided for pain management, pt reports tolerable to proceed.   ADL: OT providing skilled intervention on ADL retraining in order to increase independence with tasks and increase activity tolerance. Pt completed the following tasks at the current level of assist: Bed mobility: Max A, attempting to complete log roll, able to sit EOB with CGA-SBA if good BOS Grooming/oral hygiene: SBA seated on W/C at sink to complete oral hygiene, pt able to put toothpaste on toothbrush and brush teeth Toilet transfer: stedy transfer, CGA to stand within stedy Toileting: Max A, standing within stedy to maintain balance, OT assisting with pants management, (+) for BM Footwear: total A bed level for grip socks   Pt seated in W/C at end of session with W/C alarm donned, call light within reach and 4Ps assessed.    Therapy/Group: Individual Therapy  [name], OTD, OTR/L 08/07/2024, 1:03 PM

## 2024-08-08 LAB — CBC
HCT: 36.4 % (ref 36.0–46.0)
Hemoglobin: 11.9 g/dL — ABNORMAL LOW (ref 12.0–15.0)
MCH: 29.8 pg (ref 26.0–34.0)
MCHC: 32.7 g/dL (ref 30.0–36.0)
MCV: 91.2 fL (ref 80.0–100.0)
Platelets: 226 K/uL (ref 150–400)
RBC: 3.99 MIL/uL (ref 3.87–5.11)
RDW: 16.2 % — ABNORMAL HIGH (ref 11.5–15.5)
WBC: 6.3 K/uL (ref 4.0–10.5)
nRBC: 0 % (ref 0.0–0.2)

## 2024-08-08 LAB — BASIC METABOLIC PANEL WITH GFR
Anion gap: 8 (ref 5–15)
BUN: 30 mg/dL — ABNORMAL HIGH (ref 8–23)
CO2: 23 mmol/L (ref 22–32)
Calcium: 8.6 mg/dL — ABNORMAL LOW (ref 8.9–10.3)
Chloride: 107 mmol/L (ref 98–111)
Creatinine, Ser: 0.92 mg/dL (ref 0.44–1.00)
GFR, Estimated: >60 mL/min (ref >60)
Glucose, Bld: 100 mg/dL — ABNORMAL HIGH (ref 70–99)
Potassium: 4.8 mmol/L (ref 3.5–5.1)
Sodium: 138 mmol/L (ref 135–145)

## 2024-08-08 NOTE — Progress Notes (Signed)
   08/08/24 1300  Spiritual Encounters  Type of Visit Initial  Care provided to: Patient  Reason for visit Routine spiritual support  OnCall Visit No   Chaplain responded to consult request for support. The patient shared her life story and how she ended up being in the hospital. Chaplain listened attentively and provided emotional support. Chaplain remains available if needed.   M.Kubra Susanna Kerry Resident [phone]

## 2024-08-08 NOTE — Progress Notes (Signed)
 Physical Therapy Session Note  Patient Details  Name: Kathy Owens MRN: [id number] Date of Birth: 28-Apr-1956  Today's Date: 08/08/2024 PT Individual Time: 1500-1530 PT Individual Time Calculation (min): 30 min   Short Term Goals: Week 1:  PT Short Term Goal 1 (Week 1): pt will initiate standing with RW PT Short Term Goal 2 (Week 1): Pt will initiate SPT PT Short Term Goal 3 (Week 1): Pt will tolerate standing >5 min for tone management  Skilled Therapeutic Interventions/Progress Updates:    Pt seated in w/c on arrival and agreeable to therapy. No complaint of pain. Pt transported to therapy gym for time management and energy conservation. Pt performed x 2 bouts of gait in // bars. Required min a and VC to stand, emphasis on facilitating anterior weight shift. Pt with severe adduction tone L>R. Pt able to ambulate with light min a forward and backwards x 2. Able to create foot clearance while ambulating forward, but rotating hips for backwards gait. Pt returned to room and remained in w/c, was left with all needs in reach and alarm active.   Therapy Documentation Precautions:  Precautions Precautions: Fall Recall of Precautions/Restrictions: Intact Precaution/Restrictions Comments: hx of CP with spasticity Restrictions Weight Bearing Restrictions Per Provider Order: No General:       Therapy/Group: Individual Therapy  [name] 08/08/2024, 3:37 PM

## 2024-08-08 NOTE — Progress Notes (Signed)
 Occupational Therapy Session Note  Patient Details  Name: Kathy Owens MRN: [id number] Date of Birth: 1956-09-11  Today's Date: 08/08/2024 OT Group Time: 1105-1200 OT Group Time Calculation (min): 55 min   Short Term Goals: Week 1:  OT Short Term Goal 1 (Week 1): PT will don shirt with mod A in unsupported sitting OT Short Term Goal 2 (Week 1): Pt will transfer with the RW with mod A to the toilet. OT Short Term Goal 3 (Week 1): Pt don LB clothing with mod A OT Short Term Goal 4 (Week 1): Pt will performed supine to sitting EOB with mod A  Skilled Therapeutic Interventions/Progress Updates:      Therapy Documentation Precautions:  Precautions Precautions: Fall Recall of Precautions/Restrictions: Intact Precaution/Restrictions Comments: hx of CP with spasticity Restrictions Weight Bearing Restrictions Per Provider Order: No Pt seated in W/C upon OT arrival, agreeable to group OT.  Pain: no pain reported  Exercises: Group Description: UE group:OT providing skilled intervention with the following exercise circuit in order to improve functional activity, strength and endurance to prepare for ADLs. Pt completed the following exercises in seated position with no noted LOB/SOB and 2:30 min timer with PRN rest breaks: -bicep curls 1# dowel rod -arm circles -rows with 1# dowel rod -chest press 1# dowel rod -pronation/supination with 2# weight  OT then instructing cool down stretching for muscle recovery, OT providing gentle AAROM for stretching d/t increased muscle tightness from spasticity. Pt tolerating group well and engaging in appropriate social interactions.    Pt transported back to room by therapy tech.    Therapy/Group: Group Therapy  [name], OTD, OTR/L 08/08/2024, 12:31 PM

## 2024-08-08 NOTE — Progress Notes (Signed)
 Occupational Therapy Session Note  Patient Details  Name: Kathy Owens MRN: [id number] Date of Birth: [birth year]  Today's Date: 08/08/2024 OT Individual Time: 1345-1415 OT Individual Time Calculation (min): 30 min    Short Term Goals: Week 1:  OT Short Term Goal 1 (Week 1): PT will don shirt with mod A in unsupported sitting OT Short Term Goal 2 (Week 1): Pt will transfer with the RW with mod A to the toilet. OT Short Term Goal 3 (Week 1): Pt don LB clothing with mod A OT Short Term Goal 4 (Week 1): Pt will performed supine to sitting EOB with mod A  Skilled Therapeutic Interventions/Progress Updates:      Therapy Documentation Precautions:  Precautions Precautions: Fall Recall of Precautions/Restrictions: Intact Precaution/Restrictions Comments: hx of CP with spasticity Restrictions Weight Bearing Restrictions Per Provider Order: No General: Pt seated in W/C upon OT arrival, agreeable to OT.  Pain:  4/10 pain reported in generalized pain, activity, intermittent rest breaks, distractions provided for pain management, pt reports tolerable to proceed.   ADL: Pt completing standing activities in order to promote increased balance strategies, increased weight bearing through BLE and tone management with ADL participation. Pt completed standing within stedy for 15 min using BUE to fold towels, reaching out of BOS to place them in pile.  Occasional CGA provided within stedy for dynamic balance.   Pt seated in W/C at end of session with W/C alarm donned, call light within reach and 4Ps assessed.    Therapy/Group: Individual Therapy  [name], OTD, OTR/L 08/08/2024, 4:31 PM

## 2024-08-08 NOTE — Progress Notes (Signed)
 PROGRESS NOTE   Subjective/Complaints:  Pt reports her L eye gritty, dry and vision impaired until she gets rewetting drops.  Dry and scratchy.   LBM yesterday Wondering how her spasticity is doing.    ROS: I spent a total of    minutes on total care today- >50% coordination of care- due to   Objective:   No results found.  Recent Labs    08/08/24 0612  WBC 6.3  HGB 11.9*  HCT 36.4  PLT 226   Recent Labs    08/08/24 0612  NA 138  K 4.8  CL 107  CO2 23  GLUCOSE 100*  BUN 30*  CREATININE 0.92  CALCIUM  8.6*    Intake/Output Summary (Last 24 hours) at 08/08/2024 0813 Last data filed at 08/07/2024 1346 Gross per 24 hour  Intake 672 ml  Output --  Net 672 ml        Physical Exam: Vital Signs Blood pressure (!) 112/56, pulse 69, temperature 98.6 F (37 C), temperature source Oral, resp. rate 17, height 5' (1.524 m), weight 71.6 kg, SpO2 99%.     General: awake, alert, appropriate, sitting up in bed- finished 75% of tray;  NAD HENT: conjugate gaze; oropharynx moist- L eye injected and appears dry- gave her rewetting drops CV: regular rate and rhythm; no JVD Pulmonary: CTA B/L; no W/R/R- good air movement GI: soft, NT, ND, (+)BS- slightly hyperactive after eating Psychiatric: appropriate- joking a lot Neurological: Ox3  Neurological: spasticity still MAS of 4- no spasms seen- no clonus- knees MAS improved as well as ankles Musculoskeletal:     Cervical back: Neck supple.      Comments: UE strength RUE- 4-4+ proximally, Grip 4-/5, FA 3-/5 LUE- Deltoid, biceps, triceps 4/5- WE 4-/5; grip 3+/5 and FA 2/5 RLE- hard to assess due to severe spasticity but appears to be 2-/5 at best LLE- hard to assess due to spasticity- proximally 2-/5 at best; DF 1/5; and PF 1/5  Skin:     Comments: Almost healed herpes zoster in L V1 distribution  Neurological:     Mental Status: She is alert and oriented to person,  place, and time.     Comments: MAS of 3-4 in ankles- but still MAS of 4- but slightly better in hips and knees B/L- hips are worst Chronic mild dysarthria  Prior neuro assessment is c/w 08/08/2024 exam.   Assessment/Plan: 1. Functional deficits which require 3+ hours per day of interdisciplinary therapy in a comprehensive inpatient rehab setting. Physiatrist is providing close team supervision and 24 hour management of active medical problems listed below. Physiatrist and rehab team continue to assess barriers to discharge/monitor patient progress toward functional and medical goals  Care Tool:  Bathing    Body parts bathed by patient: Face, Chest, Abdomen   Body parts bathed by helper: Right arm, Left arm, Front perineal area, Buttocks, Right upper leg, Left upper leg, Right lower leg, Left lower leg     Bathing assist Assist Level: Total Assistance - Patient < 25%     Upper Body Dressing/Undressing Upper body dressing   What is the patient wearing?: Hospital gown only    Upper body  assist Assist Level: Total Assistance - Patient < 25%    Lower Body Dressing/Undressing Lower body dressing      What is the patient wearing?: Pants     Lower body assist Assist for lower body dressing: 2 Helpers     Toileting Toileting    Toileting assist Assist for toileting: 2 Helpers     Transfers Chair/bed transfer  Transfers assist     Chair/bed transfer assist level: Dependent - mechanical lift     Locomotion Ambulation   Ambulation assist      Assist level: Moderate Assistance - Patient 50 - 74% Assistive device: Parallel bars Max distance: 3   Walk 10 feet activity   Assist  Walk 10 feet activity did not occur: Safety/medical concerns        Walk 50 feet activity   Assist Walk 50 feet with 2 turns activity did not occur: Safety/medical concerns         Walk 150 feet activity   Assist Walk 150 feet activity did not occur: Safety/medical  concerns         Walk 10 feet on uneven surface  activity   Assist Walk 10 feet on uneven surfaces activity did not occur: Safety/medical concerns         Wheelchair     Assist Is the patient using a wheelchair?: Yes Type of Wheelchair: Manual    Wheelchair assist level: Dependent - Patient 0% Max wheelchair distance: 150 ft    Wheelchair 50 feet with 2 turns activity    Assist        Assist Level: Dependent - Patient 0%   Wheelchair 150 feet activity     Assist      Assist Level: Dependent - Patient 0%   Blood pressure (!) 112/56, pulse 69, temperature 98.6 F (37 C), temperature source Oral, resp. rate 17, height 5' (1.524 m), weight 71.6 kg, SpO2 99%.  Medical Problem List and Plan: 1. Functional deficits secondary to cerebral palsy/ massive increase in spasticity             -patient may  shower             -ELOS/Goals: 3-4 weeks- min-mod A hopefully            -Con't CIR PT and OT             Pt will also need a w/c evaluation while here 2.  Antithrombotics: -DVT/anticoagulation:  Pharmaceutical: Xarelto             -antiplatelet therapy: n/a   3. Pain Management: Hx of chronic pain d/t cerebral palsy-continue on home hydrocodone -acetaminophen  5-3 25 as needed   4. Mood/Behavior/Sleep: LCSW to follow for evaluation and support when available.              -antipsychotic agents: n/a   5. Neuropsych/cognition: This patient is capable of making decisions on her own behalf.   6. Skin/Wound Care: routine pressure relief measures.             -monitor lesion over V1 distribution on left-continue Eucerin    10/20- will con't Rewetting drops frequently for L eye- due to dryness 7. Fluids/Electrolytes/Nutrition: Monitor I&O with f/u labs in a.m.              -regular diet + Ensure    8. Muscle spacticity/CP: Resumed dantrolene 25 mg BID on 10/10. Flexeril  5 mg TID              -  Monitor CBC and CMP             Will titrate dantrolene up  tomorrow to TID  10/17- increased Dantrolene to 25 mg TID today- encouraged pt to take, since her spasticity is so severe.   10/18-19-- no significant change at this point. Increase dantrium to 25mg  qid. Pt is on board. Will need further titration if tolerated.   10/20- just got increase in Dantrolene- feeling better about increase in Dantrolene, and tolerating it. I suggest increasing Dantrolene to 50 mg TID on TH/Friday this week if tolerating.  9.  Zoster/V1 distribution:  Completed 14-day course of Valtrex on 10/11 but would benefit from zoster VAX.  Outpatient follow-up with ophthalmology d/t elevated IOP on 9/27.             - Continue as needed eyedrops    101/7- ordered eye drops to be at bedside.  9. HLD: Lipitor 10 mg    10. Hx of Chronic Diarrhea:  monitor potassium and magnesium labs, continue Imodium  2 mg daily.     11. Cause of spasticity?  10/17- when I got approval to bring pt to CIR, the insurance peer to peer asked that I work up more what might be causing pt's increased spasticity- I ordered CT of Chest, abd and pelvis- and if that is not fruitful, will d/w Neurology if they have any ideas- Peer to peer wanted an LP. CT not resulted yet- does have severe canal stenosis at L1/2 but wouldn't explain her spasticity, just weakness. Per chart, pt refused surgery- will d/w pt again next Monday  10/18 CT of abdomen and pelvis really unremarkable. She has one 8mm nodule in RUL of lung with follow up recommended in ~6-12 mos  10/20- I will ask team to call Neurology back if this doesn't improve over the next few days.  12. Azotemia  10/17- will write to push fluids-recheck labs Monday  10/19 pt with hx of CKD III per chart.    -may not be too far from baseline   -we discussed pushing fluids today--labs tomorrow  10/20- pt reports drinking a lot of water- BUN 30 today- which is pretty normal for her while here.   I spent a total of  35  minutes on total care today- >50% coordination of  care- due to  D/w pt and nursing about dry eye on L; spasticity and continued titration of Dantrolene.    LOS: 4 days A FACE TO FACE EVALUATION WAS PERFORMED  Kathy Owens 08/08/2024, 8:13 AM

## 2024-08-08 NOTE — Progress Notes (Signed)
 Patient ID: Kathy Owens, female   DOB: Jul 07, 1956, 68 y.o.   MRN: [id number]  1201- SW left message for pt sister [name] to introduce self, explain role, discuss discharge process, and inform on ELOS. SW will follow-up with updates after team conference.   Graeme Jude, MSW, LCSW Office: [phone] Cell: [phone] Fax: [phone]

## 2024-08-08 NOTE — Progress Notes (Addendum)
 Physical Therapy Session Note  Patient Details  Name: Kathy Owens MRN: [id number] Date of Birth: 10-12-1956  Today's Date: 08/08/2024 PT Individual Time:  1415-1530      Short Term Goals: Week 1:  PT Short Term Goal 1 (Week 1): pt will initiate standing with RW PT Short Term Goal 2 (Week 1): Pt will initiate SPT PT Short Term Goal 3 (Week 1): Pt will tolerate standing >5 min for tone management  Skilled Therapeutic Interventions/Progress Updates:    Pt in w/c when PT arrived, pt very pleasant and agreeable for session, pt denies pain.  Pt requesting to be changed.  Pt transferred to bed with steady, EOB->supine with Max A x2 for trunk support, LE placement.  Pt able to roll side to side with extended time to change brief, pericare. Pt requires Mod A for LE abduction due to hypertonic adductors. Supine->sit with Max A x2 to EOB->steady with Min A for trunk support.  Pt able to pull self up with CGA in [place].  Pt performed sit to stand in [place] ~x5, worked on standing tolerance cueing to contract glutes, posture correction, cueing to shift wt to left LE due to right LE preference.  Pt able to stand x3' max.  Pt requires Total A to place feet onto Steady; mod/sev extensor tone.  Pt then wheeled to main gym to work on gait.  Pt requires x2 assist for gait x6' for balance, cueing for proper RW placement, LE placement.  Pt demonstrates short, shuffled gait pattern with genu valgum L>R.  Pt hesitant to stay within the RW and states, I never walk like this.  Pt fatigued post activity, needed extended rest break.  Performed sit to stand in // bars to finish session.  Pt wheeled back to room and transferred into bed, all items within reach.   Therapy Documentation Precautions:  Precautions Precautions: Fall Recall of Precautions/Restrictions: Intact Precaution/Restrictions Comments: hx of CP with spasticity Restrictions Weight Bearing Restrictions Per Provider Order: No  Pain: Pain  Assessment Pain Scale: 0-10 Pain Score: 0-No pain     Therapy/Group: Individual Therapy  Arland GORMAN Fast 08/08/2024, 8:29 AM

## 2024-08-08 NOTE — Progress Notes (Signed)
 Occupational Therapy Session Note  Patient Details  Name: Kathy Owens MRN: [id number] Date of Birth: [birth year]  Today's Date: 08/08/2024 OT Individual Time: 9069-8954 OT Individual Time Calculation (min): 75 min    Short Term Goals: Week 1:  OT Short Term Goal 1 (Week 1): PT will don shirt with mod A in unsupported sitting OT Short Term Goal 2 (Week 1): Pt will transfer with the RW with mod A to the toilet. OT Short Term Goal 3 (Week 1): Pt don LB clothing with mod A OT Short Term Goal 4 (Week 1): Pt will performed supine to sitting EOB with mod A  Skilled Therapeutic Interventions/Progress Updates:    Skilled OT intervention with focus on bed mobility, sitting balance, bathing w/c level, dressing with sit<>stand in [place], and BLE stretching/therex for increased monbility and independence with BADLs. Pt initiated sitting EOB but required max A to comlete task. Pt initially with significant extensor tone but relieved with slow facilitation into flexed sitting posture at EOB. Pt able to maintain static sitting balance EOB with close supervision after allowing tone to decrease. Tot A for LB dressing with sit<>stand in [place]. Sit<>stand in [place] with CGA. UB dressing with mod A seated in w/c. UB bathing with mod A seated in w/c. BLE stretching and therex for improved mobility-hip and knee flexion with facilitation. Pt initiates hip flexion L>R seated in w/c. Pt unable to actively elicit knee flexion. Pt remained in w/c and transitioned to day room for therapy group.  Therapy Documentation Precautions:  Precautions Precautions: Fall Recall of Precautions/Restrictions: Intact Precaution/Restrictions Comments: hx of CP with spasticity Restrictions Weight Bearing Restrictions Per Provider Order: No   Pain: Pt denies pain this morning    Therapy/Group: Individual Therapy  [name] 08/08/2024, 11:12 AM

## 2024-08-09 NOTE — Progress Notes (Signed)
 Occupational Therapy Session Note  Patient Details  Name: Kathy Owens MRN: [id number] Date of Birth: [birth year]  Today's Date: 08/09/2024 OT Individual Time: 1402-1502 OT Individual Time Calculation (min): 60 min    Short Term Goals: Week 1:  OT Short Term Goal 1 (Week 1): PT will don shirt with mod A in unsupported sitting OT Short Term Goal 2 (Week 1): Pt will transfer with the RW with mod A to the toilet. OT Short Term Goal 3 (Week 1): Pt don LB clothing with mod A OT Short Term Goal 4 (Week 1): Pt will performed supine to sitting EOB with mod A  Skilled Therapeutic Interventions/Progress Updates:    Pt received in the w/c with NT and RN present assisting with changing brief 2/2 urinary incontinence. She stood x3 repetitions in the stedy with heavy assist needed to break up extension tone in the entire BLE, in order to place BLE on the floor. She was able to maintain stand for several minutes during total A peri hygiene.  New brief donned total A. She sat back down with max A. Pt was taken via w/c to the therapy gym for time management. She was placed in the parallel bars where she completed multiple repetitions of sit > stand with prolonged stand statically. Worked on progressing to mini squats, 2x12 repetitions with mod facilitation at the hips, and then worked up to two bouts of 6 ft of functional mobility with mod A required for dynamic weight shift at the hips. Pt required use of rest breaks throughout session for recovery, as well as to support safety and prevent overexertion. During breaks, OT monitored recovery time to assess endurance and response to exertion. Pt returned to their room following. Pt was left sitting up in the wheelchair with all needs met, and call bell within reach.    Therapy Documentation Precautions:  Precautions Precautions: Fall Recall of Precautions/Restrictions: Intact Precaution/Restrictions Comments: hx of CP with spasticity Restrictions Weight  Bearing Restrictions Per Provider Order: No Therapy/Group: Individual Therapy  Nena VEAR Moats 08/09/2024, 2:34 PM

## 2024-08-09 NOTE — Progress Notes (Signed)
 Physical Therapy Note  Patient Details  Name: Corinthian K [place] MRN: [id number] Date of Birth: Dec 02, 1955 Today's Date: 08/09/2024    Physical Therapist participated in the interdisciplinary team conference, providing clinical information regarding the patient's current status, treatment goals, and weekly focus, including any barriers that need to be addressed. Please see the Inpatient Rehabilitation Team Conference and Plan of Care Update for further details.    [name] 08/09/2024, 4:34 PM

## 2024-08-09 NOTE — Progress Notes (Signed)
 Physical Therapy Session Note  Patient Details  Name: Kathy Owens MRN: [id number] Date of Birth: [birth year]  Today's Date: 08/09/2024 PT Individual Time: 0945-1100 PT Individual Time Calculation (min): 75 min   Short Term Goals: Week 1:  PT Short Term Goal 1 (Week 1): pt will initiate standing with RW PT Short Term Goal 2 (Week 1): Pt will initiate SPT PT Short Term Goal 3 (Week 1): Pt will tolerate standing >5 min for tone management  Skilled Therapeutic Interventions/Progress Updates:    Pt seated in w/c on arrival and agreeable to therapy. Intermittent pain with stretching, adjusted as necessary. Pt transported to therapy gym for time management and energy conservation.   Attempted to stand with RW with +2, but unable at first d/t spasticity and difficulty maintaining LE position even with assist. Pt with strong tendency to adduct and internally rotate, L>R. Extended time spent stretching BIL hips, hamstrings and internal/external rotation for improved positioning in standing. Pt then able to stand to RW with Max x 2 with assist to pull forward over nearly fully extended BLE. Pt participated in mini squats x 5 with +2 assist for safety. Transitioned to seated anterior lean/sit up to reach forward for front bar of RW to stretch hips and lower back with goal of breaking extensor to allow for anterior lean in Sit to stand and strengthen core.   Pt returned to room and remained in TIS with needs in reach.   Therapy Documentation Precautions:  Precautions Precautions: Fall Recall of Precautions/Restrictions: Intact Precaution/Restrictions Comments: hx of CP with spasticity Restrictions Weight Bearing Restrictions Per Provider Order: No General:     Therapy/Group: Individual Therapy  [name] 08/09/2024, 10:38 AM

## 2024-08-09 NOTE — Plan of Care (Signed)
  Problem: Consults Goal: RH GENERAL PATIENT EDUCATION Description: See Patient Education module for education specifics. Outcome: Progressing   Problem: RH BOWEL ELIMINATION Goal: RH STG MANAGE BOWEL WITH ASSISTANCE Description: STG Manage Bowel with min- mod Assistance. Outcome: Progressing   Problem: RH BLADDER ELIMINATION Goal: RH STG MANAGE BLADDER WITH ASSISTANCE Description: STG Manage Bladder With min-mod Assistance Outcome: Progressing   Problem: RH SKIN INTEGRITY Goal: RH STG SKIN FREE OF INFECTION/BREAKDOWN Description: Manage skin free of infection with min - mod assistance Outcome: Progressing   Problem: RH SAFETY Goal: RH STG ADHERE TO SAFETY PRECAUTIONS W/ASSISTANCE/DEVICE Description: STG Adhere to Safety Precautions With min- mod Assistance/Device. Outcome: Progressing   Problem: RH PAIN MANAGEMENT Goal: RH STG PAIN MANAGED AT OR BELOW PT'S PAIN GOAL Description: < 4 w/ prns Outcome: Progressing   Problem: RH KNOWLEDGE DEFICIT GENERAL Goal: RH STG INCREASE KNOWLEDGE OF SELF CARE AFTER HOSPITALIZATION Description: Manage increase knowledge of self after hospitalization with min- mod assistance from sister using educational materials provided Outcome: Progressing

## 2024-08-09 NOTE — Patient Care Conference (Signed)
 Inpatient RehabilitationTeam Conference and Plan of Care Update Date: 08/09/2024   Time: 1110 am    Patient Name: Kathy Owens      Medical Record Number: [id number]  Date of Birth: [birth year] Sex: Female         Room/Bed: 4W25C/4W25C-01 Payor Info: Payor: [employer]vertising copywriter MEDICARE / Plan: DREMA DUAL COMPLETE / Product Type: *No Product type* /    Admit Date/Time:  08/04/2024  2:22 PM  Primary Diagnosis:  Cerebral palsy, diplegic [facility])  Hospital Problems: Principal Problem:   Cerebral palsy, diplegic (HCC) Active Problems:   Muscle spasticity   History of progressive weakness    Expected Discharge Date: Expected Discharge Date: 08/23/24  Team Members Present: Physician leading conference: Dr. Arthea Gunther Social Worker Present: Graeme Jude, LCSW Nurse Present: Eulalio Falls, RN PT Present: Schuyler Batter, PT OT Present: [name], OT PPS Coordinator present : Eleanor Colon, SLP     Current Status/Progress Goal Weekly Team Focus  Bowel/Bladder   incontinence of bowel and bladder   maintain adequate bowel and bladder function   assess bladder and bowel function each shift    Swallow/Nutrition/ Hydration               ADL's   max/tot A BADLs EOB and w/c; transfers with Stedy (sit<>stand in [place] with CGA); significant extensor tone but able to maintain static sitting balance EOB with supervision   min A overall   BADLs, transfers, education    Mobility   max bed mobility d/t tone, able to stand with stedy with min a/CGA, but using tone to stand   CGA transfers with LRAD, supervision w/c  transfer training, tone management    Communication                Safety/Cognition/ Behavioral Observations               Pain   no pain noted this shift   keep pain level minimal to no pain   assess pain each shift and prn    Skin   no open areas noted at this time   encourage repositioning to avoid skin breakdown  assess skin  each shift to avoid breakdown      Discharge Planning:  Pt to d/c to home with alone; sister will help at this time as she lives in her home. SW will confirm there are no barriers to discharge    Team Discussion: Patient admitted post cerebral palsy/ massive increase in spasticity. MD adjusted medications for ongoing spasticity. Patient's progress limited by significant extensor tone.   Patient on target to meet rehab goals: Currently patient needs max-total assistance  with ADLs and bed mobility. Overall goals at discharge are set for min assistance.  *See Care Plan and progress notes for long and short-term goals.   Revisions to Treatment Plan:  W/C evaluation Eucerin cream  Neurology consult Stedy  Teaching Needs: Safety, medications, transfers, toileting, etc.   Current Barriers to Discharge: Decreased caregiver support, Home enviroment access/layout, Incontinence, and Weight  Possible Resolutions to Barriers: Family Education DME: W/C     Medical Summary Current Status: spastic cerebral palsy with increased spasticity. etiology unclear. ct work up negative. ckd iii.  Barriers to Discharge: Medical stability;Spasticity   Possible Resolutions to [employer] Focus: monitoring tone daily. adjusting dantrium as tolerated. pt education. daily monitoring of labs and pt data   Continued Need for Acute Rehabilitation Level of Care: The patient requires daily medical management by a physician  with specialized training in physical medicine and rehabilitation for the following reasons: Direction of a multidisciplinary physical rehabilitation program to maximize functional independence : Yes Medical management of patient stability for increased activity during participation in an intensive rehabilitation regime.: Yes Analysis of laboratory values and/or radiology reports with any subsequent need for medication adjustment and/or medical intervention. : Yes   I attest that I  was present, lead the team conference, and concur with the assessment and plan of the team.   Alvin Rubano Gayo 08/09/2024, 1110 am

## 2024-08-09 NOTE — Progress Notes (Signed)
 PROGRESS NOTE   Subjective/Complaints:  Feels that her spasticity is better with the increased dantrium. Tolerating it well. Is open to increasing it. Left eye still feels rough but no worse. Drops help   ROS: Patient denies fever, rash, sore throat, blurred vision, dizziness, nausea, vomiting, diarrhea, cough, shortness of breath or chest pain, joint or back/neck pain, headache, or mood change.   Objective:   No results found.  Recent Labs    08/08/24 0612  WBC 6.3  HGB 11.9*  HCT 36.4  PLT 226   Recent Labs    08/08/24 0612  NA 138  K 4.8  CL 107  CO2 23  GLUCOSE 100*  BUN 30*  CREATININE 0.92  CALCIUM  8.6*    Intake/Output Summary (Last 24 hours) at 08/09/2024 0951 Last data filed at 08/09/2024 0733 Gross per 24 hour  Intake 660 ml  Output --  Net 660 ml        Physical Exam: Vital Signs Blood pressure (!) 140/64, pulse 78, temperature 98.7 F (37.1 C), temperature source Oral, resp. rate 18, height 5' (1.524 m), weight 71.6 kg, SpO2 99%.     Constitutional: No distress . Vital signs reviewed. HEENT: NCAT, EOMI, oral membranes moist Neck: supple Cardiovascular: RRR without murmur. No JVD    Respiratory/Chest: CTA Bilaterally without wheezes or rales. Normal effort    GI/Abdomen: BS +, non-tender, non-distended Ext: no clubbing, cyanosis, or edema Psych: pleasant and cooperative  Neurological: spasticity still MAS of 4- no spasms seen- no clonus- knees MAS improved as well as ankles Musculoskeletal:     Cervical back: Neck supple.      Comments: UE strength RUE- 4-4+ proximally, Grip 4-/5, FA 3-/5 LUE- Deltoid, biceps, triceps 4/5- WE 4-/5; grip 3+/5 and FA 2/5 RLE- hard to assess due to severe spasticity but appears to be 2-/5 at best LLE- hard to assess due to spasticity- proximally 2-/5 at best; DF 1/5; and PF 1/5  Skin:     Comments: Almost healed herpes zoster in L V1 distribution   Neurological:     Mental Status: She is alert and oriented to person, place, and time.     Comments: MAS of 3-4 in ankles- but still MAS of 3-4- hips/knees--a little better than the weekend.  Chronic mild dysarthria     Assessment/Plan: 1. Functional deficits which require 3+ hours per day of interdisciplinary therapy in a comprehensive inpatient rehab setting. Physiatrist is providing close team supervision and 24 hour management of active medical problems listed below. Physiatrist and rehab team continue to assess barriers to discharge/monitor patient progress toward functional and medical goals  Care Tool:  Bathing    Body parts bathed by patient: Face, Chest, Abdomen   Body parts bathed by helper: Right arm, Left arm, Front perineal area, Buttocks, Right upper leg, Left upper leg, Right lower leg, Left lower leg     Bathing assist Assist Level: Total Assistance - Patient < 25%     Upper Body Dressing/Undressing Upper body dressing   What is the patient wearing?: Pull over shirt    Upper body assist Assist Level: Moderate Assistance - Patient 50 - 74%  Lower Body Dressing/Undressing Lower body dressing      What is the patient wearing?: Pants     Lower body assist Assist for lower body dressing: 2 Helpers     Toileting Toileting    Toileting assist Assist for toileting: 2 Helpers     Transfers Chair/bed transfer  Transfers assist     Chair/bed transfer assist level: Dependent - mechanical lift     Locomotion Ambulation   Ambulation assist      Assist level: Moderate Assistance - Patient 50 - 74% Assistive device: Parallel bars Max distance: 3   Walk 10 feet activity   Assist  Walk 10 feet activity did not occur: Safety/medical concerns        Walk 50 feet activity   Assist Walk 50 feet with 2 turns activity did not occur: Safety/medical concerns         Walk 150 feet activity   Assist Walk 150 feet activity did not occur:  Safety/medical concerns         Walk 10 feet on uneven surface  activity   Assist Walk 10 feet on uneven surfaces activity did not occur: Safety/medical concerns         Wheelchair     Assist Is the patient using a wheelchair?: Yes Type of Wheelchair: Manual    Wheelchair assist level: Dependent - Patient 0% Max wheelchair distance: 150 ft    Wheelchair 50 feet with 2 turns activity    Assist        Assist Level: Dependent - Patient 0%   Wheelchair 150 feet activity     Assist      Assist Level: Dependent - Patient 0%   Blood pressure (!) 140/64, pulse 78, temperature 98.7 F (37.1 C), temperature source Oral, resp. rate 18, height 5' (1.524 m), weight 71.6 kg, SpO2 99%.  Medical Problem List and Plan: 1. Functional deficits secondary to cerebral palsy/ massive increase in spasticity             -patient may  shower             -ELOS/Goals: 3-4 weeks- min-mod A hopefully            -Continue CIR therapies including PT and OT. Interdisciplinary team conference today to discuss goals, barriers to discharge, and dc planning.              Pt will also need a w/c evaluation while here 2.  Antithrombotics: -DVT/anticoagulation:  Pharmaceutical: Xarelto             -antiplatelet therapy: n/a   3. Pain Management: Hx of chronic pain d/t cerebral palsy-continue on home hydrocodone -acetaminophen  5-3 25 as needed   4. Mood/Behavior/Sleep: LCSW to follow for evaluation and support when available.              -antipsychotic agents: n/a   5. Neuropsych/cognition: This patient is capable of making decisions on her own behalf.   6. Skin/Wound Care: routine pressure relief measures.             -monitor lesion over V1 distribution on left-continue Eucerin    10/20- will con't Rewetting drops frequently for L eye- due to dryness 7. Fluids/Electrolytes/Nutrition: Monitor I&O with f/u labs in a.m.              -regular diet + Ensure    8. Muscle  spacticity/CP: Resumed dantrolene 25 mg BID on 10/10. Flexeril  5 mg TID              -  Monitor CBC and CMP             Will titrate dantrolene up tomorrow to TID  10/17- increased Dantrolene to 25 mg TID today- encouraged pt to take, since her spasticity is so severe.   10/18-19-- no significant change at this point. Increase dantrium to 25mg  qid. Pt is on board. Will need further titration if tolerated.   10/20- just got increase in Dantrolene- feeling better about increase in Dantrolene, and tolerating it. I suggest increasing Dantrolene to 50 mg TID on TH/Friday this week if tolerating.  9.  Zoster/V1 distribution:  Completed 14-day course of Valtrex on 10/11 but would benefit from zoster VAX.  Outpatient follow-up with ophthalmology d/t elevated IOP on 9/27.             - Continue as needed eyedrops    101/7- ordered eye drops to be at bedside.  9. HLD: Lipitor 10 mg    10. Hx of Chronic Diarrhea:  monitor potassium and magnesium labs, continue Imodium  2 mg daily.     11. Cause of spasticity?  10/17- when I got approval to bring pt to CIR, the insurance peer to peer asked that I work up more what might be causing pt's increased spasticity- I ordered CT of Chest, abd and pelvis- and if that is not fruitful, will d/w Neurology if they have any ideas- Peer to peer wanted an LP. CT not resulted yet- does have severe canal stenosis at L1/2 but wouldn't explain her spasticity, just weakness. Per chart, pt refused surgery- will d/w pt again next Monday  10/18 CT of abdomen and pelvis really unremarkable. She has one 8mm nodule in RUL of lung with follow up recommended in ~6-12 mos  10/21- consider contacting Neurology again regarding etiology although she is improving somewhat. Spasticity can worsen with age in CP patients as well 12. Azotemia  10/17- will write to push fluids-recheck labs Monday  10/19 pt with hx of CKD III per chart.    -may not be too far from baseline   -we discussed pushing  fluids today--labs tomorrow  10/21 reassured pt that renal function was stable.    -she's drinking plenty   LOS: 5 days A FACE TO FACE EVALUATION WAS PERFORMED  Kathy Owens 08/09/2024, 9:51 AM

## 2024-08-09 NOTE — Progress Notes (Signed)
 Physical Therapy Session Note  Patient Details  Name: Kathy Owens MRN: [id number] Date of Birth: [birth year]  Today's Date: 08/09/2024 PT Individual Time: 0832-0914 PT Individual Time Calculation (min): 42 min   Short Term Goals: Week 1:  PT Short Term Goal 1 (Week 1): pt will initiate standing with RW PT Short Term Goal 2 (Week 1): Pt will initiate SPT PT Short Term Goal 3 (Week 1): Pt will tolerate standing >5 min for tone management  Skilled Therapeutic Interventions/Progress Updates:    Pt presents in bed, eager to get up. Focused on NMR to address postural control retraingin, functional mobility, ROM , and overall balance during mobility and transfers. Performed supine ROM and stretching to BLE prior to OOB to increased mobility and decrease tone in all planes. Rolling to don and pull up pants with total A for clothing management and mod A to roll with cues or technique. Max A for supine to sit with extra time to allow pt advance LE's as much as possible herself, utilized HOB elevated and bed rails for LUE support. Extra time to achieve upright sitting balance initially with mod A progressing to S with UE support, trunk extension initially cause of extra support needed. Positioned with stedy in front with pillow to block knees and protect skin due to decreased ROM - scooting with mod A to get right positioning. Extra time and pt attempting various techniques but able to perform sit > stand with S x 2 trials and extra time for anterior weightshift and to maintain postural control - cues for upright trunk and hip extension. Transferred to TIS w/c with Stedy. Set up at sink to perform hygiene and grooming. Repositioned and scooting back in w/c for NMR with cues for technique and head/hips relationship - difficulty getting adequate anterior weightshift and WB through LE so put TIS in reclined position and pt worked on scooting with gravity assist. Set up with all needs in reach and safety belt  donned.   Therapy Documentation Precautions:  Precautions Precautions: Fall Recall of Precautions/Restrictions: Intact Precaution/Restrictions Comments: hx of CP with spasticity Restrictions Weight Bearing Restrictions Per Provider Order: No  Pain: Pain Assessment Pain Scale: 0-10 Pain Score: 0-No pain   Therapy/Group: Individual Therapy  Elnor Pizza Sherrell Pizza WENDI Elnor, PT, DPT, CBIS  08/09/2024, 9:52 AM

## 2024-08-09 NOTE — Progress Notes (Signed)
 Occupational Therapy Note  Patient Details  Name: Kathy Owens MRN: [id number] Date of Birth: 12/30/1955  Occupational Therapist participated in the interdisciplinary team conference, providing clinical information regarding the patient's current status, treatment goals, and weekly focus, including any barriers that need to be addressed. Please see the Inpatient Rehabilitation Team Conference and Plan of Care Update for further details.   [name], OTD, OTR/L 08/09/2024, 4:33 PM

## 2024-08-09 NOTE — Progress Notes (Signed)
 Patient ID: Kathy Owens, female   DOB: 03-Oct-1956, 68 y.o.   MRN: [id number]  1137- SW spoke with pt sister Kathy Owens to introduce self, explain role, discuss discharge process, inform on d/c date 11/4, and give updates from team conference. SW informed on pt likely discharge with a power wheelchair, and will need physical assistance at discharge due to tone. She inquired about how mch. SW shared with majority of ADLs/IADLs. SW discussed family edu. Fam edu scheduled for Wed 11/28 1pm-4pm.   1147- SW left message for [place] [facility] to submit CAP/DA referral. SW waiting on follow-up.  1223- SW called pt sister Kathy Owens and left a message to inform on w/c eval on Tuesday at 10am and encouraged to be present if able too.  SW met with pt in room to inform  on above. She asks questions about discharge to home. SW shared will likely be ambulance transport to home, however, will confirm closer to discharge.  Graeme Jude, MSW, LCSW Office: [phone] Cell: [phone] Fax: [phone]

## 2024-08-10 DIAGNOSIS — F54 Psychological and behavioral factors associated with disorders or diseases classified elsewhere: Secondary | ICD-10-CM

## 2024-08-10 MED ORDER — DANTROLENE SODIUM 25 MG PO CAPS
50.0000 mg | ORAL_CAPSULE | Freq: Three times a day (TID) | ORAL | Status: DC
Start: 2024-08-10 — End: 2024-08-12
  Administered 2024-08-10 – 2024-08-12 (×7): 50 mg via ORAL
  Filled 2024-08-10 (×8): qty 2

## 2024-08-10 NOTE — Progress Notes (Signed)
 PROGRESS NOTE   Subjective/Complaints:  Pt without new issues. We discussed dantrium and she's ready to try increased amount.   ROS: Patient denies fever, rash, sore throat, blurred vision, dizziness, nausea, vomiting, diarrhea, cough, shortness of breath or chest pain, joint or back/neck pain, headache, or mood change.   Objective:   No results found.  Recent Labs    08/08/24 0612  WBC 6.3  HGB 11.9*  HCT 36.4  PLT 226   Recent Labs    08/08/24 0612  NA 138  K 4.8  CL 107  CO2 23  GLUCOSE 100*  BUN 30*  CREATININE 0.92  CALCIUM  8.6*    Intake/Output Summary (Last 24 hours) at 08/10/2024 0852 Last data filed at 08/09/2024 1900 Gross per 24 hour  Intake 480 ml  Output --  Net 480 ml        Physical Exam: Vital Signs Blood pressure 107/72, pulse 66, temperature 97.9 F (36.6 C), temperature source Oral, resp. rate 18, height 5' (1.524 m), weight 71.6 kg, SpO2 100%.     Constitutional: No distress . Vital signs reviewed. HEENT: NCAT, EOMI, oral membranes moist Neck: supple Cardiovascular: RRR without murmur. No JVD    Respiratory/Chest: CTA Bilaterally without wheezes or rales. Normal effort    GI/Abdomen: BS +, non-tender, non-distended Ext: no clubbing, cyanosis, or edema Psych: pleasant and cooperative  Neurological: spasticity still MAS of 4- no spasms seen- no clonus- knees MAS improved as well as ankles Musculoskeletal:     Cervical back: Neck supple.      Comments: UE strength RUE- 4-4+ proximally, Grip 4-/5, FA 3-/5 LUE- Deltoid, biceps, triceps 4/5- WE 4-/5; grip 3+/5 and FA 2/5 RLE- hard to assess due to severe spasticity but appears to be 2-/5 at best LLE- hard to assess due to spasticity- proximally 2-/5 at best; DF 1/5; and PF 1/5  Skin:     Comments: Almost healed herpes zoster in L V1 distribution  Neurological:     Mental Status: She is alert and oriented to person, place, and  time.     Comments: MAS of 3-4 in ankles- but still MAS of 3-4- hips/knees--a little better than admit. Chronic mild dysarthria     Assessment/Plan: 1. Functional deficits which require 3+ hours per day of interdisciplinary therapy in a comprehensive inpatient rehab setting. Physiatrist is providing close team supervision and 24 hour management of active medical problems listed below. Physiatrist and rehab team continue to assess barriers to discharge/monitor patient progress toward functional and medical goals  Care Tool:  Bathing    Body parts bathed by patient: Face, Chest, Abdomen   Body parts bathed by helper: Right arm, Left arm, Front perineal area, Buttocks, Right upper leg, Left upper leg, Right lower leg, Left lower leg     Bathing assist Assist Level: Total Assistance - Patient < 25%     Upper Body Dressing/Undressing Upper body dressing   What is the patient wearing?: Pull over shirt    Upper body assist Assist Level: Moderate Assistance - Patient 50 - 74%    Lower Body Dressing/Undressing Lower body dressing      What is the patient wearing?: Pants  Lower body assist Assist for lower body dressing: 2 Helpers     Toileting Toileting    Toileting assist Assist for toileting: 2 Helpers     Transfers Chair/bed transfer  Transfers assist     Chair/bed transfer assist level: Dependent - mechanical lift     Locomotion Ambulation   Ambulation assist      Assist level: Moderate Assistance - Patient 50 - 74% Assistive device: Parallel bars Max distance: 3   Walk 10 feet activity   Assist  Walk 10 feet activity did not occur: Safety/medical concerns        Walk 50 feet activity   Assist Walk 50 feet with 2 turns activity did not occur: Safety/medical concerns         Walk 150 feet activity   Assist Walk 150 feet activity did not occur: Safety/medical concerns         Walk 10 feet on uneven surface  activity   Assist  Walk 10 feet on uneven surfaces activity did not occur: Safety/medical concerns         Wheelchair     Assist Is the patient using a wheelchair?: Yes Type of Wheelchair: Manual    Wheelchair assist level: Dependent - Patient 0% Max wheelchair distance: 150 ft    Wheelchair 50 feet with 2 turns activity    Assist        Assist Level: Dependent - Patient 0%   Wheelchair 150 feet activity     Assist      Assist Level: Dependent - Patient 0%   Blood pressure 107/72, pulse 66, temperature 97.9 F (36.6 C), temperature source Oral, resp. rate 18, height 5' (1.524 m), weight 71.6 kg, SpO2 100%.  Medical Problem List and Plan: 1. Functional deficits secondary to cerebral palsy/ massive increase in spasticity             -patient may  shower             -ELOS/Goals: 3-4 weeks- min-mod A hopefully            - w/c evaluation while here  -Continue CIR therapies including PT, OT  2.  Antithrombotics: -DVT/anticoagulation:  Pharmaceutical: Xarelto             -antiplatelet therapy: n/a   3. Pain Management: Hx of chronic pain d/t cerebral palsy-continue on home hydrocodone -acetaminophen  5-3 25 as needed   4. Mood/Behavior/Sleep: LCSW to follow for evaluation and support when available.              -antipsychotic agents: n/a   5. Neuropsych/cognition: This patient is capable of making decisions on her own behalf.   6. Skin/Wound Care: routine pressure relief measures.             -monitor lesion over V1 distribution on left-continue Eucerin    10/20- will con't Rewetting drops frequently for L eye- due to dryness 7. Fluids/Electrolytes/Nutrition: Monitor I&O with f/u labs in a.m.              -regular diet + Ensure    8. Muscle spacticity/CP: Resumed dantrolene 25 mg BID on 10/10. Flexeril  5 mg TID              - Monitor CBC and CMP             Will titrate dantrolene up tomorrow to TID  10/17- increased Dantrolene to 25 mg TID today- encouraged pt to take,  since her spasticity is so severe.  10/18-19-- no significant change at this point. Increase dantrium to 25mg  qid. Pt is on board. Will need further titration if tolerated.   10/22 increase dantrium to 50mg  TID. Therapy discussed in team conference that pt relies on extensor tone to stand. We might change that by increasing dantrium. In that case would need to work on muscle strengthening. ---observe over next 24+ hours  9.  Zoster/V1 distribution:  Completed 14-day course of Valtrex on 10/11 but would benefit from zoster VAX.  Outpatient follow-up with ophthalmology d/t elevated IOP on 9/27.             - Continue as needed eyedrops    10/22 eye drops helping, eye looks better .  9. HLD: Lipitor 10 mg    10. Hx of Chronic Diarrhea:  monitor potassium and magnesium labs, continue Imodium  2 mg daily.     11. Cause of spasticity?  10/17- when I got approval to bring pt to CIR, the insurance peer to peer asked that I work up more what might be causing pt's increased spasticity- I ordered CT of Chest, abd and pelvis- and if that is not fruitful, will d/w Neurology if they have any ideas- Peer to peer wanted an LP. CT not resulted yet- does have severe canal stenosis at L1/2 but wouldn't explain her spasticity, just weakness. Per chart, pt refused surgery- will d/w pt again next Monday  10/18 CT of abdomen and pelvis really unremarkable. She has one 8mm nodule in RUL of lung with follow up recommended in ~6-12 mos  10/22- consider contacting Neurology again regarding etiology although she is improving somewhat. Spasticity can worsen with age in CP patients as well 12. Azotemia  10/17- will write to push fluids-recheck labs Monday  10/19 pt with hx of CKD III per chart.    -may not be too far from baseline   -we discussed pushing fluids today--labs tomorrow  10/22 have reassured pt that renal function was stable.    -she's drinking plenty   LOS: 6 days A FACE TO FACE EVALUATION WAS  PERFORMED  Kathy Owens 08/10/2024, 8:52 AM

## 2024-08-10 NOTE — Progress Notes (Signed)
 Occupational Therapy Session Note  Patient Details  Name: Kathy Owens MRN: [id number] Date of Birth: 1956/03/19  Today's Date: 08/10/2024 OT Individual Time: 9159-9054 OT Individual Time Calculation (min): 65 min  and Today's Date: 08/10/2024 OT Missed Time: 10 Minutes Missed Time Reason: Nursing care   Short Term Goals: Week 1:  OT Short Term Goal 1 (Week 1): PT will don shirt with mod A in unsupported sitting OT Short Term Goal 2 (Week 1): Pt will transfer with the RW with mod A to the toilet. OT Short Term Goal 3 (Week 1): Pt don LB clothing with mod A OT Short Term Goal 4 (Week 1): Pt will performed supine to sitting EOB with mod A  Skilled Therapeutic Interventions/Progress Updates:    Missed 10 min d/t nursing care. Pt received resting in bed presenting to be in good spirits receptive to skilled OT session reporting 0/10 pain- OT offering intermittent rest breaks, repositioning, and therapeutic support to optimize participation in therapy session. Supine>EOB with MAX A to maintain upright posture. Donned shirt with MOD A and required MOD A to maintain upright posture. Donned pants with MAX A to weave feet into pants and pulling pants over hips while standing using the STEDY. STEDY transfer to TIS WC with CGA and MIN cues for body positioning. Transported Pt to dayroom to make adjustments to leg rests for appropriate length and added padding to reduce any possible pressure points. Pt reported room looking darker and hazy even though it looks sunny outside, checked vitals BP 140/71(90) HR 80 SpO2 96 and informed treatment team of report. Pt complete 10 bicep curls and chest press with MIN cueing for technique to improve UE function in preparation for ADLs. Transported Pt back to room via TIS WC d/t time management. Pt was left resting reclined in TIS WC with call bell in reach, seatbelt alarm on, and all needs met.    Therapy Documentation Precautions:  Precautions Precautions:  Fall Recall of Precautions/Restrictions: Intact Precaution/Restrictions Comments: hx of CP with spasticity Restrictions Weight Bearing Restrictions Per Provider Order: No  Therapy/Group: Individual Therapy  Paulina Fleeta Dixie 08/10/2024, 11:13 AM

## 2024-08-10 NOTE — Progress Notes (Signed)
 Occupational Therapy Session Note  Patient Details  Name: Kathy Owens MRN: [id number] Date of Birth: 08-03-1956  Today's Date: 08/10/2024 OT Individual Time: 1030-1140 OT Individual Time Calculation (min): 70 min    Short Term Goals: Week 1:  OT Short Term Goal 1 (Week 1): PT will don shirt with mod A in unsupported sitting OT Short Term Goal 2 (Week 1): Pt will transfer with the RW with mod A to the toilet. OT Short Term Goal 3 (Week 1): Pt don LB clothing with mod A OT Short Term Goal 4 (Week 1): Pt will performed supine to sitting EOB with mod A  Skilled Therapeutic Interventions/Progress Updates:    1:1 Pt received in the w/c. Self care retraining at shower level . Focus on short ambulation with functional turn in the bathroom with RW with min A + 2 for safety. Pt very slow with steps and at times needs assist to rotate/ turn RW. Pt went from door of bathroom to tub bench facing outward. Pt required A to scoot hips back onto the bench using a towel to scoot each hip back.  Pt able to bathe face, UB and thighs. Sit to stand with RW with min A to stand for therapist to assist with washing front periarea and buttocks. Pt returned to sitting. Pt performed stand pivot transfer back into w/c with Rw with more than reasonable amt of time but was able to do it with min A with A to guide RW and instructional cues. Pt donned shirt with min A sitting in supported seating (assistance over the head). Total A to thread pants and don socks and shoes. Pt sit to stand at the sink with min A with extra time for therapist to pull up pants.   Also with shoes donned pt's Legs are not adducted too much that they are touching to prevent break down.  Pt left sitting up in the w/c in prep for lunch.   Therapy Documentation Precautions:  Precautions Precautions: Fall Recall of Precautions/Restrictions: Intact Precaution/Restrictions Comments: hx of CP with spasticity Restrictions Weight Bearing  Restrictions Per Provider Order: No    Therapy/Group: Individual Therapy  Claudene Nest [facility] 08/10/2024, 11:51 AM
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
## 2024-08-10 NOTE — Progress Notes (Addendum)
 Patient ID: Kathy Owens, female   DOB: [birth year], 68 y.o.   MRN: [id number]  [id number] SW left message for [place] [facility] CAP/DA to submit referral and waiting on follow-up.  *referral submitted.   Graeme Jude, MSW, LCSW Office: [phone] Cell: [phone] Fax: [phone]

## 2024-08-10 NOTE — Progress Notes (Signed)
 Physical Therapy Session Note  Patient Details  Name: Kathy Owens MRN: [id number] Date of Birth: [birth year]  Today's Date: 08/10/2024 PT Individual Time: 8582-8541 PT Individual Time Calculation (min): 41 min   Short Term Goals: Week 1:  PT Short Term Goal 1 (Week 1): pt will initiate standing with RW PT Short Term Goal 2 (Week 1): Pt will initiate SPT PT Short Term Goal 3 (Week 1): Pt will tolerate standing >5 min for tone management  Skilled Therapeutic Interventions/Progress Updates:      Pt in wheelchair on arrival - reports no pain. Pt eager to work on her walking.   Transported patient to therapy gym. Sit<>stand from w/c to RW with min/modA with her legs kept extended and PT boosting her to rise. She prefers to do it her way which is keeping her R foot outside RW frame, legs extended. Once standing, she just needs minA for standing balance with RW support. Genu valgus and supination in both feet. Possibly would benefit from bracing (?).   Gait training completed 3x12ft with minA and RW with +2 for wheelchair follow. Gait distance limited by fatigue. Patient drags her feet through swing phase, has no clearance. May also benefit from toe caps on shoes (?). Needs assist for RW management to keep from getting too far away from her.   Pt returned to her room and was left seated in TIS w/c with her needs met, RN in room for handoff of care.   Therapy Documentation Precautions:  Precautions Precautions: Fall Recall of Precautions/Restrictions: Intact Precaution/Restrictions Comments: hx of CP with spasticity Restrictions Weight Bearing Restrictions Per Provider Order: No General:      Therapy/Group: Individual Therapy  [name] 08/10/2024, 7:53 AM

## 2024-08-11 ENCOUNTER — Ambulatory Visit: Admitting: Internal Medicine

## 2024-08-11 LAB — CBC
HCT: 32.6 % — ABNORMAL LOW (ref 36.0–46.0)
Hemoglobin: 10.7 g/dL — ABNORMAL LOW (ref 12.0–15.0)
MCH: 29.7 pg (ref 26.0–34.0)
MCHC: 32.8 g/dL (ref 30.0–36.0)
MCV: 90.6 fL (ref 80.0–100.0)
Platelets: 210 10*3/uL (ref 150–400)
RBC: 3.6 MIL/uL — ABNORMAL LOW (ref 3.87–5.11)
RDW: 16.3 % — ABNORMAL HIGH (ref 11.5–15.5)
WBC: 6.7 10*3/uL (ref 4.0–10.5)
nRBC: 0 % (ref 0.0–0.2)

## 2024-08-11 LAB — BASIC METABOLIC PANEL WITH GFR
Anion gap: 9 (ref 5–15)
BUN: 33 mg/dL — ABNORMAL HIGH (ref 8–23)
CO2: 21 mmol/L — ABNORMAL LOW (ref 22–32)
Calcium: 8.6 mg/dL — ABNORMAL LOW (ref 8.9–10.3)
Chloride: 108 mmol/L (ref 98–111)
Creatinine, Ser: 0.99 mg/dL (ref 0.44–1.00)
GFR, Estimated: >60 mL/min
Glucose, Bld: 108 mg/dL — ABNORMAL HIGH (ref 70–99)
Potassium: 4 mmol/L (ref 3.5–5.1)
Sodium: 138 mmol/L (ref 135–145)

## 2024-08-11 NOTE — Progress Notes (Signed)
 Occupational Therapy Session Note  Patient Details  Name: Rolinda K [place] MRN: [id number] Date of Birth: 10-12-1956  Today's Date: 08/11/2024 OT Individual Time: 1345-1430 OT Individual Time Calculation (min): 45 min    Short Term Goals: Week 1:  OT Short Term Goal 1 (Week 1): PT will don shirt with mod A in unsupported sitting OT Short Term Goal 2 (Week 1): Pt will transfer with the RW with mod A to the toilet. OT Short Term Goal 3 (Week 1): Pt don LB clothing with mod A OT Short Term Goal 4 (Week 1): Pt will performed supine to sitting EOB with mod A  Skilled Therapeutic Interventions/Progress Updates:    Pt resting in PWC upon arrival. Skilled OT intervention with focus on PWC mobility and safety awareness to increase independence with mobility after discharge. Pt practiced powering straight, turning corners, entering/exiting doors, and safety with PWC in addition to education on characteristics of PWC in regards to cornering. Educated pt on importance of turning PWC off when not using. Reviewed features. Pt will require repetition to become proficient. Pt reamined in PWC with all needs within reach.   Therapy Documentation Precautions:  Precautions Precautions: Fall Recall of Precautions/Restrictions: Intact Precaution/Restrictions Comments: hx of CP with spasticity Restrictions Weight Bearing Restrictions Per Provider Order: No Pain:  Pt denies pain this afternoon   Therapy/Group: Individual Therapy  [name] 08/11/2024, 2:58 PM

## 2024-08-11 NOTE — Progress Notes (Signed)
 Occupational Therapy Session Note  Patient Details  Name: Janne K [place] MRN: [id number] Date of Birth: [birth year]  Today's Date: 08/11/2024 OT Individual Time: 1045-1200 OT Individual Time Calculation (min): 75 min    Short Term Goals: Week 1:  OT Short Term Goal 1 (Week 1): PT will don shirt with mod A in unsupported sitting OT Short Term Goal 2 (Week 1): Pt will transfer with the RW with mod A to the toilet. OT Short Term Goal 3 (Week 1): Pt don LB clothing with mod A OT Short Term Goal 4 (Week 1): Pt will performed supine to sitting EOB with mod A  Skilled Therapeutic Interventions/Progress Updates:    Skilled OT intervention with focus on PWC education, functional transfers, and PWC mobility to increase pt's independence with BADLs and IADLs. Stand step tranfser to [facility] with min A and extra time. Adjustments to [facility] joy stick positioning and selection of joy stick. Pt educated on functions of controls and basic movements. Pt practiced in open space for safety. OTA continued to reassure pt that amb and transfers would continue to be a focus. Pt will require ongoing practice with mobility. Pt remained in PWC with all needs within reach.   Therapy Documentation Precautions:  Precautions Precautions: Fall Recall of Precautions/Restrictions: Intact Precaution/Restrictions Comments: hx of CP with spasticity Restrictions Weight Bearing Restrictions Per Provider Order: No   Pain:  Pt deneis pain this morning  Therapy/Group: Individual Therapy  [name] 08/11/2024, 12:18 PM

## 2024-08-11 NOTE — Progress Notes (Signed)
 Physical Therapy Session Note  Patient Details  Name: Kathy Owens MRN: [id number] Date of Birth: 1956/10/17  Today's Date: 08/11/2024 PT Individual Time: 0815-0900 PT Individual Time Calculation (min): 45 min   Short Term Goals: Week 1:  PT Short Term Goal 1 (Week 1): pt will initiate standing with RW PT Short Term Goal 2 (Week 1): Pt will initiate SPT PT Short Term Goal 3 (Week 1): Pt will tolerate standing >5 min for tone management  Skilled Therapeutic Interventions/Progress Updates:    Pt missed x 15 min at start of session while eating breakfast. Will make up as able.   pt received in bed and agreeable to therapy. No complaint of pain. Therapist provided passive stretching for spasticity management in all planes. MD in/out for morning rounds   Pt then assisted with dependent brief change with saturated brief. Bed mobility with max x 2 d/t extensor tone. Pt able to stand to stedy with min a and maintain for pericare. Tot a to don pants. Stedy transfer to TIS, was left with all needs in reach and alarm active.   Therapy Documentation Precautions:  Precautions Precautions: Fall Recall of Precautions/Restrictions: Intact Precaution/Restrictions Comments: hx of CP with spasticity Restrictions Weight Bearing Restrictions Per Provider Order: No General: PT Amount of Missed Time (min): 15 Minutes (breakfast) PT Missed Treatment Reason: Other (Comment)     Therapy/Group: Individual Therapy  [name] 08/11/2024, 4:24 PM
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
## 2024-08-11 NOTE — Plan of Care (Signed)
  Problem: Consults Goal: RH GENERAL PATIENT EDUCATION Description: See Patient Education module for education specifics. Outcome: Progressing   Problem: RH BOWEL ELIMINATION Goal: RH STG MANAGE BOWEL WITH ASSISTANCE Description: STG Manage Bowel with min- mod Assistance. Outcome: Progressing   Problem: RH BLADDER ELIMINATION Goal: RH STG MANAGE BLADDER WITH ASSISTANCE Description: STG Manage Bladder With min-mod Assistance Outcome: Progressing   Problem: RH SKIN INTEGRITY Goal: RH STG SKIN FREE OF INFECTION/BREAKDOWN Description: Manage skin free of infection with min - mod assistance Outcome: Progressing   Problem: RH SAFETY Goal: RH STG ADHERE TO SAFETY PRECAUTIONS W/ASSISTANCE/DEVICE Description: STG Adhere to Safety Precautions With min- mod Assistance/Device. Outcome: Progressing   Problem: RH PAIN MANAGEMENT Goal: RH STG PAIN MANAGED AT OR BELOW PT'S PAIN GOAL Description: < 4 w/ prns Outcome: Progressing   Problem: RH KNOWLEDGE DEFICIT GENERAL Goal: RH STG INCREASE KNOWLEDGE OF SELF CARE AFTER HOSPITALIZATION Description: Manage increase knowledge of self after hospitalization with min- mod assistance from sister using educational materials provided Outcome: Progressing

## 2024-08-11 NOTE — Progress Notes (Signed)
 Occupational Therapy Session Note  Patient Details  Name: Kathy Owens MRN: [id number] Date of Birth: [birth year]  Today's Date: 08/11/2024 OT Individual Time: 0930-1000 OT Individual Time Calculation (min): 30 min    Short Term Goals: Week 1:  OT Short Term Goal 1 (Week 1): PT will don shirt with mod A in unsupported sitting OT Short Term Goal 2 (Week 1): Pt will transfer with the RW with mod A to the toilet. OT Short Term Goal 3 (Week 1): Pt don LB clothing with mod A OT Short Term Goal 4 (Week 1): Pt will performed supine to sitting EOB with mod A  Skilled Therapeutic Interventions/Progress Updates:    Pt resting in w/c upon arrival. Skilled OT intervention with focus on PWC discussion/recommendation. OTA demonstrated PWC reassured pt that therapy would continue to focus on amb with RW and transfers. Reassured pt that PWC was to increase pt's independence in home envitionment and to help pt conserve her energy in addition facilitate a safe transition to home. Pt agreeable to trying PWC. Pt remained in w/c with all needs within reach.   Therapy Documentation Precautions:  Precautions Precautions: Fall Recall of Precautions/Restrictions: Intact Precaution/Restrictions Comments: hx of CP with spasticity Restrictions Weight Bearing Restrictions Per Provider Order: No   Pain:  Pt denies pain this morning   Therapy/Group: Individual Therapy  [name] 08/11/2024, 12:17 PM

## 2024-08-11 NOTE — Progress Notes (Addendum)
 PROGRESS NOTE   Subjective/Complaints:  Pt with nausea and then dizziness/fatigue initially with dantrium increase, but when all was said and done, she tolerated and wants to continue on current dose.   ROS: Patient denies fever, rash, sore throat, blurred vision,   vomiting, diarrhea, cough, shortness of breath or chest pain, joint or back/neck pain, headache, or mood change.   Objective:   No results found.  Recent Labs    08/11/24 0425  WBC 6.7  HGB 10.7*  HCT 32.6*  PLT 210   Recent Labs    08/11/24 0425  NA 138  K 4.0  CL 108  CO2 21*  GLUCOSE 108*  BUN 33*  CREATININE 0.99  CALCIUM  8.6*    Intake/Output Summary (Last 24 hours) at 08/11/2024 0945 Last data filed at 08/10/2024 1800 Gross per 24 hour  Intake 540 ml  Output --  Net 540 ml        Physical Exam: Vital Signs Blood pressure (!) 106/57, pulse 72, temperature 98.4 F (36.9 C), temperature source Oral, resp. rate 18, height 5' (1.524 m), weight 71.6 kg, SpO2 98%.     Constitutional: No distress . Vital signs reviewed. HEENT: NCAT, EOMI, oral membranes moist Neck: supple Cardiovascular: RRR without murmur. No JVD    Respiratory/Chest: CTA Bilaterally without wheezes or rales. Normal effort    GI/Abdomen: BS +, non-tender, non-distended Ext: no clubbing, cyanosis, or edema Psych: pleasant and cooperative  Neurological: Alert and oriented x 3. Normal insight and awareness. Intact Memory. Normal language, Sl dysarthria. Cranial nerve exam unremarkable.        UE strength RUE- 4-4+ proximally, Grip 4-/5, FA 3-/5 LUE- Deltoid, biceps, triceps 4/5- WE 4-/5; grip 3+/5 and FA 2/5 RLE-tone 3 prox to 3-4 distally LLE- tone- proximally -->dis  3+--> 3-4 Skin:     Comments: Almost healed herpes zoster in L V1 distribution       Assessment/Plan: 1. Functional deficits which require 3+ hours per day of interdisciplinary therapy in a  comprehensive inpatient rehab setting. Physiatrist is providing close team supervision and 24 hour management of active medical problems listed below. Physiatrist and rehab team continue to assess barriers to discharge/monitor patient progress toward functional and medical goals  Care Tool:  Bathing    Body parts bathed by patient: Face, Chest, Abdomen, Right arm, Left arm, Right upper leg, Left upper leg   Body parts bathed by helper: Right lower leg, Left lower leg, Front perineal area, Buttocks     Bathing assist Assist Level: Moderate Assistance - Patient 50 - 74%     Upper Body Dressing/Undressing Upper body dressing   What is the patient wearing?: Pull over shirt    Upper body assist Assist Level: Minimal Assistance - Patient > 75%    Lower Body Dressing/Undressing Lower body dressing      What is the patient wearing?: Pants     Lower body assist Assist for lower body dressing: Total Assistance - Patient < 25%     Toileting Toileting    Toileting assist Assist for toileting: 2 Helpers     Transfers Chair/bed transfer  Transfers assist     Chair/bed transfer assist  level: Minimal Assistance - Patient > 75%     Locomotion Ambulation   Ambulation assist      Assist level: Moderate Assistance - Patient 50 - 74% Assistive device: Parallel bars Max distance: 3   Walk 10 feet activity   Assist  Walk 10 feet activity did not occur: Safety/medical concerns        Walk 50 feet activity   Assist Walk 50 feet with 2 turns activity did not occur: Safety/medical concerns         Walk 150 feet activity   Assist Walk 150 feet activity did not occur: Safety/medical concerns         Walk 10 feet on uneven surface  activity   Assist Walk 10 feet on uneven surfaces activity did not occur: Safety/medical concerns         Wheelchair     Assist Is the patient using a wheelchair?: Yes Type of Wheelchair: Manual    Wheelchair  assist level: Dependent - Patient 0% Max wheelchair distance: 150 ft    Wheelchair 50 feet with 2 turns activity    Assist        Assist Level: Dependent - Patient 0%   Wheelchair 150 feet activity     Assist      Assist Level: Dependent - Patient 0%   Blood pressure (!) 106/57, pulse 72, temperature 98.4 F (36.9 C), temperature source Oral, resp. rate 18, height 5' (1.524 m), weight 71.6 kg, SpO2 98%.  Medical Problem List and Plan: 1. Functional deficits secondary to cerebral palsy/ massive increase in spasticity             -patient may  shower             -ELOS/Goals: 3-4 weeks- min-mod A hopefully            - w/c evaluation while here  -Continue CIR therapies including PT, OT  2.  Antithrombotics: -DVT/anticoagulation:  Pharmaceutical: Xarelto             -antiplatelet therapy: n/a   3. Pain Management: Hx of chronic pain d/t cerebral palsy-continue on home hydrocodone -acetaminophen  5-3 25 as needed   4. Mood/Behavior/Sleep: LCSW to follow for evaluation and support when available.              -antipsychotic agents: n/a   5. Neuropsych/cognition: This patient is capable of making decisions on her own behalf.   6. Skin/Wound Care: routine pressure relief measures.             -monitor lesion over V1 distribution on left-continue Eucerin    10/20- will con't Rewetting drops frequently for L eye- due to dryness 7. Fluids/Electrolytes/Nutrition: Monitor I&O with f/u labs in a.m.              -regular diet + Ensure    8. Muscle spacticity/CP: Resumed dantrolene 25 mg BID on 10/10. Flexeril  5 mg TID              - Monitor CBC and CMP             Will titrate dantrolene up tomorrow to TID  10/17- increased Dantrolene to 25 mg TID today- encouraged pt to take, since her spasticity is so severe.   10/18-19-- no significant change at this point. Increase dantrium to 25mg  qid. Pt is on board. Will need further titration if tolerated.   10/23 tolerating 50mg  tid  dantrium so far. Unclear how much of an impact it  will make on tone. Still was pretty tight this am.   9.  Zoster/V1 distribution:  Completed 14-day course of Valtrex on 10/11 but would benefit from zoster VAX.  Outpatient follow-up with ophthalmology d/t elevated IOP on 9/27.             - Continue as needed eyedrops    10/23 eye drops helping, eye/skin looks much better .  9. HLD: Lipitor 10 mg    10. Hx of Chronic Diarrhea:  monitor potassium and magnesium labs, continue Imodium  2 mg daily.     11. Cause of spasticity?  10/17- when I got approval to bring pt to CIR, the insurance peer to peer asked that I work up more what might be causing pt's increased spasticity- I ordered CT of Chest, abd and pelvis- and if that is not fruitful, will d/w Neurology if they have any ideas- Peer to peer wanted an LP. CT not resulted yet- does have severe canal stenosis at L1/2 but wouldn't explain her spasticity, just weakness. Per chart, pt refused surgery- will d/w pt again next Monday  10/18 CT of abdomen and pelvis really unremarkable. She has one 8mm nodule in RUL of lung with follow up recommended in ~6-12 mos  10/22- consider contacting Neurology again regarding etiology although she is improving somewhat. Spasticity can worsen with age in CP patients as well 12. Azotemia     10/19 pt with hx of CKD III per chart.    -may not be too far from baseline   -we discussed pushing fluids today--labs tomorrow  10/22 have reassured pt that renal function was stable.    -she's drinking plenty   10/23 renal fxn stable  12. Anemia  -drop to 10.7 today--no signs of blood loss, asymptomatic   -recheck on Monday  -check B12 and Folate as well as iron panel tomorrow although anemia is normocytic LOS: 7 days A FACE TO FACE EVALUATION WAS PERFORMED  Kathy Owens 08/11/2024, 9:45 AM

## 2024-08-12 LAB — IRON AND TIBC
Iron: 36 ug/dL (ref 28–170)
Saturation Ratios: 15 % (ref 10.4–31.8)
TIBC: 244 ug/dL — ABNORMAL LOW (ref 250–450)
UIBC: 208 ug/dL

## 2024-08-12 LAB — VITAMIN B12: Vitamin B-12: 404 pg/mL (ref 180–914)

## 2024-08-12 LAB — FERRITIN: Ferritin: 55 ng/mL (ref 11–307)

## 2024-08-12 LAB — FOLATE: Folate: 10.4 ng/mL (ref >5.9)

## 2024-08-12 MED ORDER — FERROUS SULFATE 325 (65 FE) MG PO TABS
325.0000 mg | ORAL_TABLET | Freq: Every day | ORAL | Status: DC
  Administered 2024-08-12 – 2024-08-23 (×12): 325 mg via ORAL
  Filled 2024-08-12 (×12): qty 1

## 2024-08-12 MED ORDER — DANTROLENE SODIUM 25 MG PO CAPS
50.0000 mg | ORAL_CAPSULE | Freq: Four times a day (QID) | ORAL | Status: DC
  Administered 2024-08-12 – 2024-08-17 (×18): 50 mg via ORAL
  Filled 2024-08-12 (×22): qty 2

## 2024-08-12 NOTE — Progress Notes (Signed)
 Physical Therapy Session Note  Patient Details  Name: Kathy Owens MRN: [id number] Date of Birth: 1956-01-05  Today's Date: 08/12/2024 PT Individual Time: 1320-1332 PT Individual Time Calculation (min): 12 min   Short Term Goals: Week 1:  PT Short Term Goal 1 (Week 1): pt will initiate standing with RW PT Short Term Goal 2 (Week 1): Pt will initiate SPT PT Short Term Goal 3 (Week 1): Pt will tolerate standing >5 min for tone management  Skilled Therapeutic Interventions/Progress Updates:    Pt up in electric w/c, pt requesting to have her brief changed. NT entered and PT assisted with sit<->stand with Stedy, positioning LE to avoid excessive spasticity, LOB.  Pt able to stand in [place] for peri care.  Pt requires Mod A to abduct LE for thorough peri care.  Pt sitting back in w/c to donn brief and keeping LE's abducted, pt stood back up in Ste[place] to donn lower garments.  Pt repositioned with Total A in w/c, passed off to Nsg.    Therapy Documentation Precautions:  Precautions Precautions: Fall Recall of Precautions/Restrictions: Intact Precaution/Restrictions Comments: hx of CP with spasticity Restrictions Weight Bearing Restrictions Per Provider Order: No    Pain: 0/10     Therapy/Group: Individual Therapy  Arland GORMAN Fast 08/12/2024, 1:36 PM

## 2024-08-12 NOTE — Plan of Care (Signed)
  Problem: Consults Goal: RH GENERAL PATIENT EDUCATION Description: See Patient Education module for education specifics. Outcome: Progressing   Problem: RH BOWEL ELIMINATION Goal: RH STG MANAGE BOWEL WITH ASSISTANCE Description: STG Manage Bowel with min- mod Assistance. Outcome: Progressing   Problem: RH BLADDER ELIMINATION Goal: RH STG MANAGE BLADDER WITH ASSISTANCE Description: STG Manage Bladder With min-mod Assistance Outcome: Progressing   Problem: RH SKIN INTEGRITY Goal: RH STG SKIN FREE OF INFECTION/BREAKDOWN Description: Manage skin free of infection with min - mod assistance Outcome: Progressing   Problem: RH SAFETY Goal: RH STG ADHERE TO SAFETY PRECAUTIONS W/ASSISTANCE/DEVICE Description: STG Adhere to Safety Precautions With min- mod Assistance/Device. Outcome: Progressing   Problem: RH PAIN MANAGEMENT Goal: RH STG PAIN MANAGED AT OR BELOW PT'S PAIN GOAL Description: < 4 w/ prns Outcome: Progressing   Problem: RH KNOWLEDGE DEFICIT GENERAL Goal: RH STG INCREASE KNOWLEDGE OF SELF CARE AFTER HOSPITALIZATION Description: Manage increase knowledge of self after hospitalization with min- mod assistance from sister using educational materials provided Outcome: Progressing   Problem: Education: Goal: Knowledge of General Education information will improve Description: Including pain rating scale, medication(s)/side effects and non-pharmacologic comfort measures Outcome: Progressing   Problem: Health Behavior/Discharge Planning: Goal: Ability to manage health-related needs will improve Outcome: Progressing   Problem: Clinical Measurements: Goal: Ability to maintain clinical measurements within normal limits will improve Outcome: Progressing Goal: Will remain free from infection Outcome: Progressing Goal: Diagnostic test results will improve Outcome: Progressing Goal: Respiratory complications will improve Outcome: Progressing Goal: Cardiovascular complication  will be avoided Outcome: Progressing   Problem: Activity: Goal: Risk for activity intolerance will decrease Outcome: Progressing   Problem: Nutrition: Goal: Adequate nutrition will be maintained Outcome: Progressing   Problem: Coping: Goal: Level of anxiety will decrease Outcome: Progressing   Problem: Elimination: Goal: Will not experience complications related to bowel motility Outcome: Progressing Goal: Will not experience complications related to urinary retention Outcome: Progressing   Problem: Pain Managment: Goal: General experience of comfort will improve and/or be controlled Outcome: Progressing   Problem: Safety: Goal: Ability to remain free from injury will improve Outcome: Progressing   Problem: Skin Integrity: Goal: Risk for impaired skin integrity will decrease Outcome: Progressing

## 2024-08-12 NOTE — Progress Notes (Signed)
 Occupational Therapy Session Note  Patient Details  Name: Kathy Owens MRN: [id number] Date of Birth: 10/17/1956  Today's Date: 08/12/2024 OT Individual Time: 1345-1430 OT Individual Time Calculation (min): 45 min    Short Term Goals: Week 2:  OT Short Term Goal 1 (Week 2): PT will don shirt with mod A in unsupported sitting OT Short Term Goal 2 (Week 2): Pt don LB clothing with mod A OT Short Term Goal 3 (Week 2): Pt will performed supine to sitting EOB with mod A  Skilled Therapeutic Interventions/Progress Updates:    Pt received sitting in the power w/c with no c/o pain, agreeable to OT session. Worked on power w/c navigation- pt still R bias and often with poor attention to task, almost running into things on the L several times. She worked on functional mobility x2 repetitions- about 6 ft each time with the RW, with heavy mod A to stand her and then once on her feet she required min-mod A. Pt completed the BUE ergometer to challenge BUE strength and endurance needed to complete ADLs and IADLs with the highest level of independence. Pt completed 6 min with cueing for pacing and technique, as well as min A to keep her RUE on the handle. She navigated the w/c back to her room, similar assist needed. Provided edu on possibility of using goal post joy stick- brought into room for her to try tomorrow. Pt was left sitting up in the wheelchair with all needs met and call bell within reach.    Therapy Documentation Precautions:  Precautions Precautions: Fall Recall of Precautions/Restrictions: Intact Precaution/Restrictions Comments: hx of CP with spasticity Restrictions Weight Bearing Restrictions Per Provider Order: No  Therapy/Group: Individual Therapy  Nena VEAR Moats 08/12/2024, 2:59 PM

## 2024-08-12 NOTE — Progress Notes (Signed)
 Occupational Therapy Weekly Progress Note  Patient Details  Name: Kathy Owens MRN: [id number] Date of Birth: 1956/07/22  Beginning of progress report period: August 05, 2024 End of progress report period: August 12, 2024  Patient has met 1 of 4 short term goals.  Pt making slow progress with BADLs and functional transfers. Pt requires max A overall for BADLs. Sit<>stand with RW at min A and extra time. Pt has walked 20', and 10' x 2 with OT/OTA since admission. Pt able to weight shift and advance BLE minimally. Reviewed goal for supervision for w/c transfers but use PWC for primary mobility upon discharge. Pt with considerable extensor tone but able to overcome with extra time and slow controlled movements. Pt requires max verbal cues for PWC mobility. Pt currenlty using PWC from Rehab with recommendation for use at home. Family has not bee present for education.   Patient continues to demonstrate the following deficits: muscle weakness and muscle joint tightness, decreased cardiorespiratoy endurance, impaired timing and sequencing, abnormal tone, unbalanced muscle activation, and decreased coordination, and decreased sitting balance, decreased standing balance, decreased postural control, and decreased balance strategies and therefore will continue to benefit from skilled OT intervention to enhance overall performance with BADL.  Patient progressing toward long term goals..  Continue plan of care.  OT Short Term Goals Week 1:  OT Short Term Goal 1 (Week 1): PT will don shirt with mod A in unsupported sitting OT Short Term Goal 1 - Progress (Week 1): Progressing toward goal OT Short Term Goal 2 (Week 1): Pt will transfer with the RW with mod A to the toilet. OT Short Term Goal 2 - Progress (Week 1): Met OT Short Term Goal 3 (Week 1): Pt don LB clothing with mod A OT Short Term Goal 3 - Progress (Week 1): Progressing toward goal OT Short Term Goal 4 (Week 1): Pt will performed supine to  sitting EOB with mod A OT Short Term Goal 4 - Progress (Week 1): Progressing toward goal Week 2:  OT Short Term Goal 1 (Week 2): PT will don shirt with mod A in unsupported sitting OT Short Term Goal 2 (Week 2): Pt don LB clothing with mod A OT Short Term Goal 3 (Week 2): Pt will performed supine to sitting EOB with mod A   [name] 08/12/2024, 12:18 PM

## 2024-08-12 NOTE — Progress Notes (Signed)
 Occupational Therapy Session Note  Patient Details  Name: Kathy Owens MRN: [id number] Date of Birth: 1956-05-30  Today's Date: 08/12/2024 OT Individual Time: 8954-8844 OT Individual Time Calculation (min): 70 min    Short Term Goals: Week 2:  OT Short Term Goal 1 (Week 2): PT will don shirt with mod A in unsupported sitting OT Short Term Goal 2 (Week 2): Pt don LB clothing with mod A OT Short Term Goal 3 (Week 2): Pt will performed supine to sitting EOB with mod A  Skilled Therapeutic Interventions/Progress Updates:    Skilled OT intervention with focus on functional amb with RW, sit<>stand, and PWC mobility to increase independence with BADLs. Sit<>stand and amb with RW at min A + 3 (pt doing 80%-+3 for safety and w/c follow when amb.) Pt requires extra time to position BLE prior to sit<>stand. Max verbal cues for advancing BLE when amb. Max verbal cues for PWC mobility but improving. Pt returend to room and remained in [facility] with all needs within reach. Belt alarm activated.   Therapy Documentation Precautions:  Precautions Precautions: Fall Recall of Precautions/Restrictions: Intact Precaution/Restrictions Comments: hx of CP with spasticity Restrictions Weight Bearing Restrictions Per Provider Order: No Pain: Pt denies pain this morning   Therapy/Group: Individual Therapy  [name] 08/12/2024, 12:19 PM

## 2024-08-12 NOTE — Progress Notes (Signed)
 Physical Therapy Weekly Progress Note  Patient Details  Name: Kathy Owens [place] MRN: [id number] Date of Birth: [birth year]  Beginning of progress report period: August 06, 2023 End of progress report period: August 12, 2024  Today's Date: 08/12/2024 PT Individual Time: 0845-1000 PT Individual Time Calculation (min): 75 min   Patient has met 2 of 3 short term goals.  Pt is progressing well with standing with support, but is still limited by tone. While tone is improving on medication, she is still limited functionally, especially with bed mobility d/t heavy extensor tone. Plan for w/c eval   Patient continues to demonstrate the following deficits muscle weakness, abnormal tone and unbalanced muscle activation, and decreased postural control and decreased balance strategies and therefore will continue to benefit from skilled PT intervention to increase functional independence with mobility.  Patient progressing toward long term goals..  Continue plan of care.  PT Short Term Goals Week 1:  PT Short Term Goal 1 (Week 1): pt will initiate standing with RW PT Short Term Goal 1 - Progress (Week 1): Met PT Short Term Goal 2 (Week 1): Pt will initiate SPT PT Short Term Goal 2 - Progress (Week 1): Not met PT Short Term Goal 3 (Week 1): Pt will tolerate standing >5 min for tone management PT Short Term Goal 3 - Progress (Week 1): Met Week 2:  PT Short Term Goal 1 (Week 2): pt will initiate SPT training PT Short Term Goal 2 (Week 2): Pt will navigate PWC >5 min without need for cueing PT Short Term Goal 3 (Week 2): pt will perform bed mobiltiy with assist of 1  Skilled Therapeutic Interventions/Progress Updates:    pt received in bed and agreeable to therapy. No complaint of pain. Therapist provided passive stretching to BIL hips in all planes for spasticity management. Therapist assisted with donning pants tot a in supine and shirt in sitting with max a. Pt performed sit ups using bed rails from  fully upright HOB to break extensor tone prior to bed mobility. Bed mobility continues to require max x 2 d/t extensor tone. Pt able to stand to stedy with min a. Navigated PWC with mod VC for safety. Step by step cueing required for using PWC features like tilt for positioning. Rest of session spent on gait training in // bars with w/c follow. Pt able to better lift BLE and clear BLE from floor vs twisting hips to advance LE. Performed length of bars x 4 with min a and w/c follow. Pt returned to room and remained in chair with needs in reach.   Therapy Documentation Precautions:  Precautions Precautions: Fall Recall of Precautions/Restrictions: Intact Precaution/Restrictions Comments: hx of CP with spasticity Restrictions Weight Bearing Restrictions Per Provider Order: No General:     Therapy/Group: Individual Therapy  [name] 08/12/2024, 2:15 PM

## 2024-08-12 NOTE — Progress Notes (Signed)
 Patient ID: Kathy Owens, female   DOB: 03-29-1956, 68 y.o.   MRN: [id number]  Demo sheet sent to [employer] per PT request.   Graeme Jude, MSW, LCSW Office: [phone] Cell: [phone] Fax: [phone]

## 2024-08-12 NOTE — Progress Notes (Signed)
 PROGRESS NOTE   Subjective/Complaints:  Pt without new complaints this morning. Tolerating the dantrium fine but a little concerned that she's not seeing a lot of change in her tone with the increase.   ROS: Patient denies fever, rash, sore throat, blurred vision, dizziness, nausea, vomiting, diarrhea, cough, shortness of breath or chest pain, headache, or mood change.   Objective:   No results found.  Recent Labs    08/11/24 0425  WBC 6.7  HGB 10.7*  HCT 32.6*  PLT 210   Recent Labs    08/11/24 0425  NA 138  K 4.0  CL 108  CO2 21*  GLUCOSE 108*  BUN 33*  CREATININE 0.99  CALCIUM  8.6*    Intake/Output Summary (Last 24 hours) at 08/12/2024 0935 Last data filed at 08/12/2024 0838 Gross per 24 hour  Intake 454 ml  Output --  Net 454 ml        Physical Exam: Vital Signs Blood pressure 127/69, pulse 67, temperature 97.9 F (36.6 C), resp. rate 18, height 5' (1.524 m), weight 71.6 kg, SpO2 98%.     Constitutional: No distress . Vital signs reviewed. HEENT: NCAT, EOMI, oral membranes moist, left eye minimally irritated Neck: supple Cardiovascular: RRR without murmur. No JVD    Respiratory/Chest: CTA Bilaterally without wheezes or rales. Normal effort    GI/Abdomen: BS +, non-tender, non-distended Ext: no clubbing, cyanosis, or edema Psych: pleasant and cooperative  Neurological: Alert and oriented x 3. Normal insight and awareness. Intact Memory. Normal language, slight dysarthria. Cranial nerve exam unremarkable.        UE strength RUE- 4-4+ proximally, Grip 4-/5, FA 3-/5 LUE- Deltoid, biceps, triceps 4/5- WE 4-/5; grip 3+/5 and FA 2/5 RLE-tone 3 prox to 3 to 4 distally LLE- tone- proximally -->dis  3+--> 3+ to 4 Skin:     Comments: Almost healed area of herpes zoster in L V1 distribution       Assessment/Plan: 1. Functional deficits which require 3+ hours per day of interdisciplinary therapy in  a comprehensive inpatient rehab setting. Physiatrist is providing close team supervision and 24 hour management of active medical problems listed below. Physiatrist and rehab team continue to assess barriers to discharge/monitor patient progress toward functional and medical goals  Care Tool:  Bathing    Body parts bathed by patient: Face, Chest, Abdomen, Right arm, Left arm, Right upper leg, Left upper leg   Body parts bathed by helper: Right lower leg, Left lower leg, Front perineal area, Buttocks     Bathing assist Assist Level: Moderate Assistance - Patient 50 - 74%     Upper Body Dressing/Undressing Upper body dressing   What is the patient wearing?: Pull over shirt    Upper body assist Assist Level: Minimal Assistance - Patient > 75%    Lower Body Dressing/Undressing Lower body dressing      What is the patient wearing?: Pants     Lower body assist Assist for lower body dressing: Total Assistance - Patient < 25%     Toileting Toileting    Toileting assist Assist for toileting: 2 Helpers     Transfers Chair/bed transfer  Transfers assist  Chair/bed transfer assist level: Minimal Assistance - Patient > 75%     Locomotion Ambulation   Ambulation assist      Assist level: Moderate Assistance - Patient 50 - 74% Assistive device: Parallel bars Max distance: 3   Walk 10 feet activity   Assist  Walk 10 feet activity did not occur: Safety/medical concerns        Walk 50 feet activity   Assist Walk 50 feet with 2 turns activity did not occur: Safety/medical concerns         Walk 150 feet activity   Assist Walk 150 feet activity did not occur: Safety/medical concerns         Walk 10 feet on uneven surface  activity   Assist Walk 10 feet on uneven surfaces activity did not occur: Safety/medical concerns         Wheelchair     Assist Is the patient using a wheelchair?: Yes Type of Wheelchair: Manual    Wheelchair  assist level: Dependent - Patient 0% Max wheelchair distance: 150 ft    Wheelchair 50 feet with 2 turns activity    Assist        Assist Level: Dependent - Patient 0%   Wheelchair 150 feet activity     Assist      Assist Level: Dependent - Patient 0%   Blood pressure 127/69, pulse 67, temperature 97.9 F (36.6 C), resp. rate 18, height 5' (1.524 m), weight 71.6 kg, SpO2 98%.  Medical Problem List and Plan: 1. Functional deficits secondary to cerebral palsy/ massive increase in spasticity             -patient may  shower             -ELOS/Goals: 3-4 weeks- min-mod A hopefully            - w/c evaluation while here  -Continue CIR therapies including PT, OT  -discussed pt's tone/today--see below  2.  Antithrombotics: -DVT/anticoagulation:  Pharmaceutical: Xarelto             -antiplatelet therapy: n/a   3. Pain Management: Hx of chronic pain d/t cerebral palsy-continue on home hydrocodone -acetaminophen  5-3 25 as needed   4. Mood/Behavior/Sleep: LCSW to follow for evaluation and support when available.              -antipsychotic agents: n/a   5. Neuropsych/cognition: This patient is capable of making decisions on her own behalf.   6. Skin/Wound Care: routine pressure relief measures.             -monitor lesion over V1 distribution on left-continue Eucerin    10/20- will con't Rewetting drops frequently for L eye- due to dryness 7. Fluids/Electrolytes/Nutrition: Monitor I&O with f/u labs in a.m.              -regular diet + Ensure, eating well    8. Muscle spacticity/CP: Resumed dantrolene 25 mg BID on 10/10. Flexeril  5 mg TID              - Monitor CBC and CMP             Will titrate dantrolene up tomorrow to TID  10/17- increased Dantrolene to 25 mg TID today- encouraged pt to take, since her spasticity is so severe.   10/18-19-- no significant change at this point. Increase dantrium to 25mg  qid. Pt is on board. Will need further titration if tolerated.    10/24 tolerating the dantrium increase to 50 TID.  But I agree with her that there hasn't been dramatic change. Will increase to 50mg  qid today. We discussed other options as an outpt which could include botox and a baclofen  pump. Check LFT's Monday.   9.  Zoster/V1 distribution:  Completed 14-day course of Valtrex on 10/11 but would benefit from zoster VAX.  Outpatient follow-up with ophthalmology d/t elevated IOP on 9/27.             - Continue as needed eyedrops    10/23-24 eye drops helping, eye/skin looks much better .  9. HLD: Lipitor 10 mg    10. Hx of Chronic Diarrhea:  monitor potassium and magnesium labs, continue Imodium  2 mg daily.     11. Cause of spasticity?  10/17- when I got approval to bring pt to CIR, the insurance peer to peer asked that I work up more what might be causing pt's increased spasticity- I ordered CT of Chest, abd and pelvis- and if that is not fruitful, will d/w Neurology if they have any ideas- Peer to peer wanted an LP. CT not resulted yet- does have severe canal stenosis at L1/2 but wouldn't explain her spasticity, just weakness. Per chart, pt refused surgery- will d/w pt again next Monday  10/18 CT of abdomen and pelvis really unremarkable. She has one 8mm nodule in RUL of lung with follow up recommended in ~6-12 mos  10/24 we spoke with neurology yesterday who had nothing further to offer. Recommend f/u with neuro as outpt as well as other interventions I mentioned above in #8 12. Azotemia     10/19 pt with hx of CKD III per chart.    -may not be too far from baseline   -we discussed pushing fluids today--labs tomorrow  10/22 have reassured pt that renal function was stable.    -she's drinking plenty   10/23 renal fxn stable  12. Anemia  -drop to 10.7 today--no signs of blood loss, asymptomatic   10/24-recheck on Monday           -  B12 and Folate as well as iron panel reviewed and iron level borderline, TIBC low   -begin scheduled ferrous  sulfate   LOS: 8 days A FACE TO FACE EVALUATION WAS PERFORMED  Arthea ONEIDA Gunther 08/12/2024, 9:35 AM

## 2024-08-13 LAB — CBC WITH DIFFERENTIAL/PLATELET
Abs Immature Granulocytes: 0.03 K/uL (ref 0.00–0.07)
Basophils Absolute: 0 K/uL (ref 0.0–0.1)
Basophils Relative: 1 %
Eosinophils Absolute: 0.5 K/uL (ref 0.0–0.5)
Eosinophils Relative: 8 %
HCT: 35.4 % — ABNORMAL LOW (ref 36.0–46.0)
Hemoglobin: 11.8 g/dL — ABNORMAL LOW (ref 12.0–15.0)
Immature Granulocytes: 1 %
Lymphocytes Relative: 23 %
Lymphs Abs: 1.4 K/uL (ref 0.7–4.0)
MCH: 30.5 pg (ref 26.0–34.0)
MCHC: 33.3 g/dL (ref 30.0–36.0)
MCV: 91.5 fL (ref 80.0–100.0)
Monocytes Absolute: 0.4 K/uL (ref 0.1–1.0)
Monocytes Relative: 6 %
Neutro Abs: 3.9 K/uL (ref 1.7–7.7)
Neutrophils Relative %: 61 %
Platelets: 221 K/uL (ref 150–400)
RBC: 3.87 MIL/uL (ref 3.87–5.11)
RDW: 16.3 % — ABNORMAL HIGH (ref 11.5–15.5)
WBC: 6.2 K/uL (ref 4.0–10.5)
nRBC: 0 % (ref 0.0–0.2)

## 2024-08-13 LAB — BASIC METABOLIC PANEL WITH GFR
Anion gap: 9 (ref 5–15)
BUN: 29 mg/dL — ABNORMAL HIGH (ref 8–23)
CO2: 19 mmol/L — ABNORMAL LOW (ref 22–32)
Calcium: 8.8 mg/dL — ABNORMAL LOW (ref 8.9–10.3)
Chloride: 109 mmol/L (ref 98–111)
Creatinine, Ser: 0.78 mg/dL (ref 0.44–1.00)
GFR, Estimated: >60 mL/min (ref >60)
Glucose, Bld: 106 mg/dL — ABNORMAL HIGH (ref 70–99)
Potassium: 3.8 mmol/L (ref 3.5–5.1)
Sodium: 137 mmol/L (ref 135–145)

## 2024-08-13 LAB — MAGNESIUM: Magnesium: 1.7 mg/dL (ref 1.7–2.4)

## 2024-08-13 MED ORDER — LOPERAMIDE HCL 2 MG PO CAPS
2.0000 mg | ORAL_CAPSULE | ORAL | Status: DC | PRN
  Administered 2024-08-13: 2 mg via ORAL
  Filled 2024-08-13: qty 1

## 2024-08-13 MED ORDER — LOPERAMIDE HCL 2 MG PO CAPS
2.0000 mg | ORAL_CAPSULE | Freq: Two times a day (BID) | ORAL | Status: DC
  Administered 2024-08-13 – 2024-08-14 (×3): 2 mg via ORAL
  Filled 2024-08-13 (×3): qty 1

## 2024-08-13 NOTE — Progress Notes (Signed)
 PROGRESS NOTE   Subjective/Complaints:  Patient with multiple bowel movements overnight, complaining of feeling worn out from diarrhea.  Denies fever, chills.  Patient concerned regarding dehydration from diarrhea, wants this checked today.  Vitals with elevated BP as below; no C/o pain     08/13/2024    5:57 AM 08/12/2024    8:32 PM 08/12/2024    3:15 PM  Vitals with BMI  Systolic 144 150 842  Diastolic 74 82 81  Pulse 70 89 82    No results for input(s): GLUCAP in the last 72 hours.   P.o. intakes 30-40% meals with assistance Continent of bladder   Last BM multiple 10/25, incontinent.   ROS: Patient denies fever, rash, sore throat, blurred vision, dizziness, nausea, vomiting, diarrhea, cough, shortness of breath or chest pain, headache, or mood change.  + Diarrhea  Objective:   No results found.  Recent Labs    08/11/24 0425  WBC 6.7  HGB 10.7*  HCT 32.6*  PLT 210   Recent Labs    08/11/24 0425  NA 138  K 4.0  CL 108  CO2 21*  GLUCOSE 108*  BUN 33*  CREATININE 0.99  CALCIUM  8.6*    Intake/Output Summary (Last 24 hours) at 08/13/2024 0948 Last data filed at 08/13/2024 0815 Gross per 24 hour  Intake 326 ml  Output --  Net 326 ml        Physical Exam: Vital Signs Blood pressure (!) 144/74, pulse 70, temperature 97.6 F (36.4 C), resp. rate 16, height 5' (1.524 m), weight 71.6 kg, SpO2 99%.     Constitutional: No distress . Vital signs reviewed.  Laying in bed. HEENT: NCAT, EOMI, oral membranes moist, left eye minimally irritated Neck: supple Cardiovascular: RRR without murmur. No JVD    Respiratory/Chest: CTA Bilaterally without wheezes or rales. Normal effort    GI/Abdomen: BS + and hyperactive, non-tender, non-distended Ext: no clubbing, cyanosis, or edema Psych: pleasant and cooperative  Skin: Left V1 rash; essentially resolved  Neurological: Alert and oriented x 3.  Normal insight and awareness. Intact Memory. Normal language, slight dysarthria. Cranial nerve exam unremarkable.        UE strength RUE- 4-4+ proximally, Grip 4-/5, FA 3-/5 LUE- Deltoid, biceps, triceps 4/5- WE 4-/5; grip 3+/5 and FA 2/5 RLE-tone 3 prox to 3 to 4 distally LLE- tone- proximally -->dis  3+--> 3+ to 4  Physical exam unchanged from the above on reexamination 08/13/24     Assessment/Plan: 1. Functional deficits which require 3+ hours per day of interdisciplinary therapy in a comprehensive inpatient rehab setting. Physiatrist is providing close team supervision and 24 hour management of active medical problems listed below. Physiatrist and rehab team continue to assess barriers to discharge/monitor patient progress toward functional and medical goals  Care Tool:  Bathing    Body parts bathed by patient: Face, Chest, Abdomen, Right arm, Left arm, Right upper leg, Left upper leg   Body parts bathed by helper: Right lower leg, Left lower leg, Front perineal area, Buttocks     Bathing assist Assist Level: Moderate Assistance - Patient 50 - 74%     Upper Body Dressing/Undressing Upper body dressing  What is the patient wearing?: Pull over shirt    Upper body assist Assist Level: Minimal Assistance - Patient > 75%    Lower Body Dressing/Undressing Lower body dressing      What is the patient wearing?: Pants     Lower body assist Assist for lower body dressing: Total Assistance - Patient < 25%     Toileting Toileting    Toileting assist Assist for toileting: 2 Helpers     Transfers Chair/bed transfer  Transfers assist     Chair/bed transfer assist level: Minimal Assistance - Patient > 75%     Locomotion Ambulation   Ambulation assist      Assist level: Moderate Assistance - Patient 50 - 74% Assistive device: Parallel bars Max distance: 3   Walk 10 feet activity   Assist  Walk 10 feet activity did not occur: Safety/medical concerns         Walk 50 feet activity   Assist Walk 50 feet with 2 turns activity did not occur: Safety/medical concerns         Walk 150 feet activity   Assist Walk 150 feet activity did not occur: Safety/medical concerns         Walk 10 feet on uneven surface  activity   Assist Walk 10 feet on uneven surfaces activity did not occur: Safety/medical concerns         Wheelchair     Assist Is the patient using a wheelchair?: Yes Type of Wheelchair: Manual    Wheelchair assist level: Dependent - Patient 0% Max wheelchair distance: 150 ft    Wheelchair 50 feet with 2 turns activity    Assist        Assist Level: Dependent - Patient 0%   Wheelchair 150 feet activity     Assist      Assist Level: Dependent - Patient 0%   Blood pressure (!) 144/74, pulse 70, temperature 97.6 F (36.4 C), resp. rate 16, height 5' (1.524 m), weight 71.6 kg, SpO2 99%.  Medical Problem List and Plan: 1. Functional deficits secondary to cerebral palsy/ massive increase in spasticity             -patient may  shower             -ELOS/Goals: 3-4 weeks- min-mod A hopefully            - w/c evaluation while here  -Continue CIR therapies including PT, OT  -discussed pt's tone/today--see below   2.  Antithrombotics: -DVT/anticoagulation:  Pharmaceutical: Xarelto             -antiplatelet therapy: n/a   3. Pain Management: Hx of chronic pain d/t cerebral palsy-continue on home hydrocodone -acetaminophen  5-3 25 as needed   4. Mood/Behavior/Sleep: LCSW to follow for evaluation and support when available.              -antipsychotic agents: n/a   5. Neuropsych/cognition: This patient is capable of making decisions on her own behalf.   6. Skin/Wound Care: routine pressure relief measures.             -monitor lesion over V1 distribution on left-continue Eucerin    10/20- will con't Rewetting drops frequently for L eye- due to dryness 7. Fluids/Electrolytes/Nutrition: Monitor I&O  with f/u labs in a.m.              -regular diet + Ensure, eating well   - 10-25: BUN downtrending, creatinine stable.  Diarrhea management as below.  8. Muscle spacticity/CP: Resumed dantrolene 25 mg BID on 10/10. Flexeril  5 mg TID              - Monitor CBC and CMP             Will titrate dantrolene up tomorrow to TID  10/17- increased Dantrolene to 25 mg TID today- encouraged pt to take, since her spasticity is so severe.   10/18-19-- no significant change at this point. Increase dantrium to 25mg  qid. Pt is on board. Will need further titration if tolerated.   10/24 tolerating the dantrium increase to 50 TID. But I agree with her that there hasn't been dramatic change. Will increase to 50mg  qid today. We discussed other options as an outpt which could include botox and a baclofen  pump. Check LFT's Monday.   9.  Zoster/V1 distribution:  Completed 14-day course of Valtrex on 10/11 but would benefit from zoster VAX.  Outpatient follow-up with ophthalmology d/t elevated IOP on 9/27.             - Continue as needed eyedrops    10/23-24 eye drops helping, eye/skin looks much better .  9. HLD: Lipitor 10 mg    10. Hx of Chronic Diarrhea:  monitor potassium and magnesium labs, continue Imodium  2 mg daily.     - 10/25: 5x liquid BM overnight; add back immodium PRN as appears it was Dced-patient request this to be scheduled twice daily.-  No leukocytosis or fevers to indicate infectious cause.   11. Cause of spasticity?  10/17- when I got approval to bring pt to CIR, the insurance peer to peer asked that I work up more what might be causing pt's increased spasticity- I ordered CT of Chest, abd and pelvis- and if that is not fruitful, will d/w Neurology if they have any ideas- Peer to peer wanted an LP. CT not resulted yet- does have severe canal stenosis at L1/2 but wouldn't explain her spasticity, just weakness. Per chart, pt refused surgery- will d/w pt again next Monday  10/18 CT of abdomen and  pelvis really unremarkable. She has one 8mm nodule in RUL of lung with follow up recommended in ~6-12 mos  10/24 we spoke with neurology yesterday who had nothing further to offer. Recommend f/u with neuro as outpt as well as other interventions I mentioned above in #8   12. Azotemia     10/19 pt with hx of CKD III per chart.    -may not be too far from baseline   -we discussed pushing fluids today--labs tomorrow  10/22 have reassured pt that renal function was stable.    -she's drinking plenty   10/23 renal fxn stable  12. Anemia  -drop to 10.7 today--no signs of blood loss, asymptomatic   10/24-recheck on Monday           -  B12 and Folate as well as iron panel reviewed and iron level borderline, TIBC low   -begin scheduled ferrous sulfate   LOS: 9 days A FACE TO FACE EVALUATION WAS PERFORMED  Joesph JAYSON Likes 08/13/2024, 9:48 AM

## 2024-08-13 NOTE — Plan of Care (Signed)
  Problem: RH BOWEL ELIMINATION Goal: RH STG MANAGE BOWEL WITH ASSISTANCE Description: STG Manage Bowel with min- mod Assistance. Outcome: Progressing   Problem: RH BLADDER ELIMINATION Goal: RH STG MANAGE BLADDER WITH ASSISTANCE Description: STG Manage Bladder With min-mod Assistance Outcome: Progressing   Problem: RH SKIN INTEGRITY Goal: RH STG SKIN FREE OF INFECTION/BREAKDOWN Description: Manage skin free of infection with min - mod assistance Outcome: Progressing   Problem: RH SAFETY Goal: RH STG ADHERE TO SAFETY PRECAUTIONS W/ASSISTANCE/DEVICE Description: STG Adhere to Safety Precautions With min- mod Assistance/Device. Outcome: Progressing   Problem: RH PAIN MANAGEMENT Goal: RH STG PAIN MANAGED AT OR BELOW PT'S PAIN GOAL Description: < 4 w/ prns Outcome: Progressing

## 2024-08-14 MED ORDER — LOPERAMIDE HCL 2 MG PO CAPS
2.0000 mg | ORAL_CAPSULE | Freq: Every day | ORAL | Status: DC
  Administered 2024-08-15 – 2024-08-23 (×7): 2 mg via ORAL
  Filled 2024-08-14 (×9): qty 1
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
## 2024-08-14 NOTE — Progress Notes (Signed)
 Physical Therapy Session Note  Patient Details  Name: Kathy Owens MRN: [id number] Date of Birth: 08/21/1956  Today's Date: 08/14/2024 PT Individual Time: 9269-9185 + 1300-1356 PT Individual Time Calculation (min): 44 min + 56 min  Short Term Goals: Week 2:  PT Short Term Goal 1 (Week 2): pt will initiate SPT training PT Short Term Goal 2 (Week 2): Pt will navigate PWC >5 min without need for cueing PT Short Term Goal 3 (Week 2): pt will perform bed mobiltiy with assist of 1  Skilled Therapeutic Interventions/Progress Updates:      1st session: Pt lying in bed asleep - awakens to voice. She has no reports of pain. Agreeable to therapy treatment.  Donned paper pants at  bed level with totalA. Pt requires totalA of 1 person for rolling in bed both directions.   Supine<>Sitting EOB with totalA of 1 person. She has difficulty with initial sitting balance, needing maxA to prevent posterior lean. TotalA for forward scooting to EOB. No RW in her room, so used the Stedy from elevated bed height (pillow support b/w knees and knee board) to transfer to [facility].  Pt practiced driving her PWC with supervision assist in open hallways. She has difficulty keeping straight path and needs minA for turns. She's unable to safely drive herself through wide doorways. She's also unsure of how to use PWC functions and but knows she just started practicing with the PWC.   Pt requests to work on ambulation. Sit<>Stand from elevated PWC height to RW with minA. She ambulates 10' with modA and RW. She has difficulty preventing the RW from getting too far away from her and keeps both legs fully extended during all phases of gait. Pt needing totalA for safely repositioning in the PWC once sitting from her ambulation. Needed the tilt features of PWC to help scooting her hips back in the chair.   Pt returned to her room and she was left in PWC with seat belt on. All needs met at end.    2nd session: Pt sitting in PWC  on arrival - requests assistance in adjusting her reclined position as she feels too far back. Spent time during session educating her on PWC functions to improve functional independence with pressure relief and safety. Used tape to isolate tilt feature only to reduce distractions and improve understanding. Also spent time practicing turning the PWC on/off but she has difficulty adjusting this based on toggle location.   Pt instructed in PWC mobility, needing supervision to intermittent minA for large open spaces, needing maxA for tight spaces. Pt has difficulty with PWC driving due to processing speed, general awareness and reaction time, as well as recall deficits. Completed blocked practice 180deg turns using midwheel drive function, stop/go, and driving around obstacles.   Pt requesting to work on ambulation, reports she doesn't plan on using the PWC unless absolutely necessary. Sit<>stand to RW with modA for managing her posterior lean. Ambulated 37ft + 16ft with min/modA with PWC follow for safety. Patient needing +2 assist to safely reposition her in the PWC, while using the full tilt function. Gait deficits similar as above.   Returned to her room and she was left tilted in PWC, needs met.       Therapy Documentation Precautions:  Precautions Precautions: Fall Recall of Precautions/Restrictions: Intact Precaution/Restrictions Comments: hx of CP with spasticity Restrictions Weight Bearing Restrictions Per Provider Order: No General:      Therapy/Group: Individual Therapy  [name] 08/14/2024, 7:18 AM

## 2024-08-14 NOTE — Plan of Care (Signed)
  Problem: Consults Goal: RH GENERAL PATIENT EDUCATION Description: See Patient Education module for education specifics. Outcome: Progressing   Problem: RH BOWEL ELIMINATION Goal: RH STG MANAGE BOWEL WITH ASSISTANCE Description: STG Manage Bowel with min- mod Assistance. Outcome: Progressing   Problem: RH BLADDER ELIMINATION Goal: RH STG MANAGE BLADDER WITH ASSISTANCE Description: STG Manage Bladder With min-mod Assistance Outcome: Progressing   Problem: RH SKIN INTEGRITY Goal: RH STG SKIN FREE OF INFECTION/BREAKDOWN Description: Manage skin free of infection with min - mod assistance Outcome: Progressing   Problem: RH SAFETY Goal: RH STG ADHERE TO SAFETY PRECAUTIONS W/ASSISTANCE/DEVICE Description: STG Adhere to Safety Precautions With min- mod Assistance/Device. Outcome: Progressing   Problem: RH PAIN MANAGEMENT Goal: RH STG PAIN MANAGED AT OR BELOW PT'S PAIN GOAL Description: < 4 w/ prns Outcome: Progressing   Problem: RH KNOWLEDGE DEFICIT GENERAL Goal: RH STG INCREASE KNOWLEDGE OF SELF CARE AFTER HOSPITALIZATION Description: Manage increase knowledge of self after hospitalization with min- mod assistance from sister using educational materials provided Outcome: Progressing   Problem: Education: Goal: Knowledge of General Education information will improve Description: Including pain rating scale, medication(s)/side effects and non-pharmacologic comfort measures Outcome: Progressing   Problem: Health Behavior/Discharge Planning: Goal: Ability to manage health-related needs will improve Outcome: Progressing   Problem: Clinical Measurements: Goal: Ability to maintain clinical measurements within normal limits will improve Outcome: Progressing Goal: Will remain free from infection Outcome: Progressing Goal: Diagnostic test results will improve Outcome: Progressing Goal: Respiratory complications will improve Outcome: Progressing Goal: Cardiovascular complication  will be avoided Outcome: Progressing   Problem: Activity: Goal: Risk for activity intolerance will decrease Outcome: Progressing   Problem: Nutrition: Goal: Adequate nutrition will be maintained Outcome: Progressing   Problem: Coping: Goal: Level of anxiety will decrease Outcome: Progressing   Problem: Elimination: Goal: Will not experience complications related to bowel motility Outcome: Progressing Goal: Will not experience complications related to urinary retention Outcome: Progressing   Problem: Pain Managment: Goal: General experience of comfort will improve and/or be controlled Outcome: Progressing   Problem: Safety: Goal: Ability to remain free from injury will improve Outcome: Progressing   Problem: Skin Integrity: Goal: Risk for impaired skin integrity will decrease Outcome: Progressing

## 2024-08-15 ENCOUNTER — Other Ambulatory Visit: Payer: Self-pay | Admitting: Internal Medicine

## 2024-08-15 DIAGNOSIS — G894 Chronic pain syndrome: Secondary | ICD-10-CM

## 2024-08-15 LAB — CBC
HCT: 33 % — ABNORMAL LOW (ref 36.0–46.0)
Hemoglobin: 10.8 g/dL — ABNORMAL LOW (ref 12.0–15.0)
MCH: 30.1 pg (ref 26.0–34.0)
MCHC: 32.7 g/dL (ref 30.0–36.0)
MCV: 91.9 fL (ref 80.0–100.0)
Platelets: 194 K/uL (ref 150–400)
RBC: 3.59 MIL/uL — ABNORMAL LOW (ref 3.87–5.11)
RDW: 16.1 % — ABNORMAL HIGH (ref 11.5–15.5)
WBC: 5.8 K/uL (ref 4.0–10.5)
nRBC: 0 % (ref 0.0–0.2)

## 2024-08-15 LAB — COMPREHENSIVE METABOLIC PANEL WITH GFR
ALT: 19 U/L (ref 0–44)
AST: 17 U/L (ref 15–41)
Albumin: 2.8 g/dL — ABNORMAL LOW (ref 3.5–5.0)
Alkaline Phosphatase: 42 U/L (ref 38–126)
Anion gap: 10 (ref 5–15)
BUN: 39 mg/dL — ABNORMAL HIGH (ref 8–23)
CO2: 20 mmol/L — ABNORMAL LOW (ref 22–32)
Calcium: 8.5 mg/dL — ABNORMAL LOW (ref 8.9–10.3)
Chloride: 110 mmol/L (ref 98–111)
Creatinine, Ser: 0.96 mg/dL (ref 0.44–1.00)
GFR, Estimated: >60 mL/min (ref >60)
Glucose, Bld: 109 mg/dL — ABNORMAL HIGH (ref 70–99)
Potassium: 3.9 mmol/L (ref 3.5–5.1)
Sodium: 140 mmol/L (ref 135–145)
Total Bilirubin: 0.5 mg/dL (ref 0.0–1.2)
Total Protein: 5.2 g/dL — ABNORMAL LOW (ref 6.5–8.1)

## 2024-08-15 MED ORDER — SODIUM CHLORIDE 0.9 % IV SOLN: INTRAVENOUS | Status: AC

## 2024-08-15 NOTE — Progress Notes (Signed)
 Physical Therapy Session Note  Patient Details  Name: Kathy Owens MRN: [id number] Date of Birth: [birth year]  Today's Date: 08/15/2024 PT Individual Time: 0805-0900, 8699-8654 PT Individual Time Calculation (min): 55 min, 45 min   Short Term Goals: Week 2:  PT Short Term Goal 1 (Week 2): pt will initiate SPT training PT Short Term Goal 2 (Week 2): Pt will navigate PWC >5 min without need for cueing PT Short Term Goal 3 (Week 2): pt will perform bed mobiltiy with assist of 1  Skilled Therapeutic Interventions/Progress Updates:    Session 1: pt received in bed and agreeable to therapy. No complaint of pain. Pt assisted with donning pants tot a in supine and shirt max a sitting EOB.   Pt able to roll with max-tot a, supine>sit max a x 2 limited by heavy extensor tone and fear of falling.  Pt performed Sit to stand x 10 working on hip flexion to break tone, able to perform without assist with heavy UE use.  Pt required Tot a to slide back and reposition in w/c, then required max step by step cueing for [facility] navigation in room with heavily increased time to perform safely . Pt remained in chair at end of session with needs in reach.   Session 2: Pt seated in w/c on arrival and agreeable to therapy. No complaint of pain. Pt navigated PWC with min cueing and supervision through wide open spaces. Session focused on Stand pivot transfer training with RW. Required +2 assist throughout for safety as pt maintains BLE extension throughout transfer. Performed x 2 with max x 2 and RW.  Required max a to for standing with RW d/t inability to independently initiate anterior weight shift. Pt required tot a x 2 to reposition in PWC. Returned to room and remained in chair, was left with all needs in reach and alarm active.   Therapy Documentation Precautions:  Precautions Precautions: Fall Recall of Precautions/Restrictions: Intact Precaution/Restrictions Comments: hx of CP with  spasticity Restrictions Weight Bearing Restrictions Per Provider Order: No General:       Therapy/Group: Individual Therapy  [name] 08/15/2024, 12:59 PM

## 2024-08-15 NOTE — Progress Notes (Signed)
 Occupational Therapy Session Note  Patient Details  Name: Kathy Owens MRN: [id number] Date of Birth: 04-Feb-1956  Today's Date: 08/15/2024 OT Individual Time: 1530-1600 OT Individual Time Calculation (min): 30 min    Short Term Goals: Week 2:  OT Short Term Goal 1 (Week 2): PT will don shirt with mod A in unsupported sitting OT Short Term Goal 2 (Week 2): Pt don LB clothing with mod A OT Short Term Goal 3 (Week 2): Pt will performed supine to sitting EOB with mod A  Skilled Therapeutic Interventions/Progress Updates:      Therapy Documentation Precautions:  Precautions Precautions: Fall Recall of Precautions/Restrictions: Intact Precaution/Restrictions Comments: hx of CP with spasticity Restrictions Weight Bearing Restrictions Per Provider Order: No General: Pt seated in W/C upon OT arrival, agreeable to OT.  Pain: no pain reported  Other Treatments: OT educating pt about W/C use and functions d/t pt initially reporting does not like PWC d/t it being difficult to drive. OT encouraging pt d/t increased independence d/t severe extensor tone and inability to complete safe functional ambulation. Pt unable to self propel manual W/C d/t UE/LE tone from underlying CP diagnosis. TIS also not appropriate d/t decreased independence and inability to relieve pressure. Pt appreciative of information. Information relayed to pt's primary PT.    Pt seated in W/C at end of session with W/C alarm donned, call light within reach and 4Ps assessed.    Therapy/Group: Individual Therapy  [name], OTD, OTR/L 08/15/2024, 4:10 PM

## 2024-08-15 NOTE — Telephone Encounter (Signed)
 Last rx written - 07/19/24. Last OV - 06/16/24. Next OV - 09/08/24.

## 2024-08-15 NOTE — Telephone Encounter (Signed)
 Patient is in the hospital (rehab) and dose at time of discharge may change.

## 2024-08-15 NOTE — Telephone Encounter (Signed)
 Copied from CRM #[id number]. Topic: Clinical - Medication Refill >> Aug 15, 2024  8:36 AM Alfonso ORN wrote: Medication: HYDROcodone -acetaminophen  (NORCO/VICODIN) 5-325 MG per tablet 1 tablet     Has the patient contacted their pharmacy? No (Agent: If no, request that the patient contact the pharmacy for the refill. If patient does not wish to contact the pharmacy document the reason why and proceed with request.) (Agent: If yes, when and what did the pharmacy advise?)  This is the patient's preferred pharmacy:  [id number] - [place], [place] - [address] AT [facility] [name] [place] [address] Phone: [phone] Fax: [phone]  Is this the correct pharmacy for this prescription? Yes If no, delete pharmacy and type the correct one.   Has the prescription been filled recently? No 07/19/24  Is the patient out of the medication? Yes  Has the patient been seen for an appointment in the last year OR does the patient have an upcoming appointment? yes  Can we respond through MyChart? No  Agent: Please be advised that Rx refills may take up to 3 business days. We ask that you follow-up with your pharmacy.

## 2024-08-15 NOTE — Progress Notes (Signed)
 VAST consult received to obtain IV access. Upon arrival to patient's bedside, pt requested IV be placed after therapy completed today. Her last therapy session ends at 1600. Per Lovorn's progress note, IVFs will be given after therapy today.

## 2024-08-15 NOTE — Progress Notes (Signed)
 Occupational Therapy Session Note  Patient Details  Name: Kathy Owens MRN: [id number] Date of Birth: [birth year]  Today's Date: 08/15/2024 OT Individual Time: 9054-8941 OT Individual Time Calculation (min): 73 min    Short Term Goals: Week 2:  OT Short Term Goal 1 (Week 2): PT will don shirt with mod A in unsupported sitting OT Short Term Goal 2 (Week 2): Pt don LB clothing with mod A OT Short Term Goal 3 (Week 2): Pt will performed supine to sitting EOB with mod A  Skilled Therapeutic Interventions/Progress Updates:    Skilled OT intervention with focus on sit<>stand, standing balance, functional amb with RW, PWC mobility, and safety awareness to increase independence with BADLs. Pt requires extra time for positioning in preparation for sit<>stand. Sit<>stand from [facility] with min A and extra time. Pt amb 13' and 21' with min A and assistance managing RW. Stand>sit with tot A+2 to assure sitting back in PWC far enough. PWC mobility in hallway, turning around, entering/exiting doorways, maneuviering obstacle course with chairs. Pt requires mod verbal cues for safety. Pt challenged more with maneuvers when in noisy environment. Pt remained in w/c with all needs within reach.   Therapy Documentation Precautions:  Precautions Precautions: Fall Recall of Precautions/Restrictions: Intact Precaution/Restrictions Comments: hx of CP with spasticity Restrictions Weight Bearing Restrictions Per Provider Order: No Pain: Pt denies pain this morning   Therapy/Group: Individual Therapy  [name] 08/15/2024, 11:03 AM

## 2024-08-15 NOTE — Progress Notes (Signed)
 PROGRESS NOTE   Subjective/Complaints:   Pt anxious about her kidney function-  BUN 39 up from 29-  but Cr 0.96- up from 0.78.  Appears dry more than anything.   Says feels a lot looser since I left, has walked between 15-25 ft. Also using power w/c.      ROS: per HPI  Pt denies SOB, abd pain, CP, N/V/C/D, and vision changes   Objective:   No results found.  Recent Labs    08/13/24 1319 08/15/24 0507  WBC 6.2 5.8  HGB 11.8* 10.8*  HCT 35.4* 33.0*  PLT 221 194   Recent Labs    08/13/24 1319 08/15/24 0507  NA 137 140  K 3.8 3.9  CL 109 110  CO2 19* 20*  GLUCOSE 106* 109*  BUN 29* 39*  CREATININE 0.78 0.96  CALCIUM  8.8* 8.5*    Intake/Output Summary (Last 24 hours) at 08/15/2024 0914 Last data filed at 08/15/2024 0550 Gross per 24 hour  Intake 286 ml  Output 200 ml  Net 86 ml        Physical Exam: Vital Signs Blood pressure 118/65, pulse 72, temperature 98.3 F (36.8 C), resp. rate 18, height 5' (1.524 m), weight 71.6 kg, SpO2 100%.     General: awake, alert, appropriate, sitting up working on breakfast tray; NAD HENT: conjugate gaze; oropharynx moist CV: regular rate and rhythm; no JVD Pulmonary: CTA B/L; no W/R/R- good air movement GI: soft, NT, ND, (+)BS Psychiatric: appropriate- joking a lot still, but shows signs of anxiety.  Neurological: Ox3 MAS of 3-4 in LEs- is improving somewhat.  Ext: no clubbing, cyanosis, or edema Psych: pleasant and cooperative  Skin: Left V1 rash--resolved  Neurological: Alert and oriented x 3. Normal insight and awareness. Intact Memory. Normal language, slight dysarthria. Cranial nerve exam unremarkable.     UE strength RUE- 4-4+ proximally, Grip 4-/5, FA 3-/5 LUE- Deltoid, biceps, triceps 4/5- WE 4-/5; grip 3+/5 and FA 2/5 RLE-tone 3 prox to 3 to 4 distally LLE- tone- proximally -->dis  3+--> 3+ to 4  Physical exam unchanged from the above on  reexamination 08/15/24     Assessment/Plan: 1. Functional deficits which require 3+ hours per day of interdisciplinary therapy in a comprehensive inpatient rehab setting. Physiatrist is providing close team supervision and 24 hour management of active medical problems listed below. Physiatrist and rehab team continue to assess barriers to discharge/monitor patient progress toward functional and medical goals  Care Tool:  Bathing    Body parts bathed by patient: Face, Chest, Abdomen, Right arm, Left arm, Right upper leg, Left upper leg   Body parts bathed by helper: Right lower leg, Left lower leg, Front perineal area, Buttocks     Bathing assist Assist Level: Moderate Assistance - Patient 50 - 74%     Upper Body Dressing/Undressing Upper body dressing   What is the patient wearing?: Pull over shirt    Upper body assist Assist Level: Minimal Assistance - Patient > 75%    Lower Body Dressing/Undressing Lower body dressing      What is the patient wearing?: Pants     Lower body assist Assist for lower body dressing:  Total Assistance - Patient < 25%     Toileting Toileting    Toileting assist Assist for toileting: 2 Helpers     Transfers Chair/bed transfer  Transfers assist     Chair/bed transfer assist level: Minimal Assistance - Patient > 75%     Locomotion Ambulation   Ambulation assist      Assist level: Moderate Assistance - Patient 50 - 74% Assistive device: Parallel bars Max distance: 3   Walk 10 feet activity   Assist  Walk 10 feet activity did not occur: Safety/medical concerns        Walk 50 feet activity   Assist Walk 50 feet with 2 turns activity did not occur: Safety/medical concerns         Walk 150 feet activity   Assist Walk 150 feet activity did not occur: Safety/medical concerns         Walk 10 feet on uneven surface  activity   Assist Walk 10 feet on uneven surfaces activity did not occur: Safety/medical  concerns         Wheelchair     Assist Is the patient using a wheelchair?: Yes Type of Wheelchair: Manual    Wheelchair assist level: Dependent - Patient 0% Max wheelchair distance: 150 ft    Wheelchair 50 feet with 2 turns activity    Assist        Assist Level: Dependent - Patient 0%   Wheelchair 150 feet activity     Assist      Assist Level: Dependent - Patient 0%   Blood pressure 118/65, pulse 72, temperature 98.3 F (36.8 C), resp. rate 18, height 5' (1.524 m), weight 71.6 kg, SpO2 100%.  Medical Problem List and Plan: 1. Functional deficits secondary to cerebral palsy/ massive increase in spasticity             -patient may  shower             -ELOS/Goals: 3-4 weeks- min-mod A hopefully            - w/c evaluation while here  -Con't CIR PT and OT- will wait to increase Dantrolene- was last increased 10/24 -discussed pt's tone/today--see below  - 10-26: Patient endorses more familiarity with prior knob joints stick then current U-shaped one.  Defer discussion to primary team on possible wheelchair adjustment/evaluation  2.  Antithrombotics: -DVT/anticoagulation:  Pharmaceutical: Xarelto             -antiplatelet therapy: n/a   3. Pain Management: Hx of chronic pain d/t cerebral palsy-continue on home hydrocodone -acetaminophen  5-3 25 as needed   4. Mood/Behavior/Sleep: LCSW to follow for evaluation and support when available.              -antipsychotic agents: n/a   5. Neuropsych/cognition: This patient is capable of making decisions on her own behalf.   6. Skin/Wound Care: routine pressure relief measures.             -monitor lesion over V1 distribution on left-continue Eucerin    10/20- will con't Rewetting drops frequently for L eye- due to dryness 7. Fluids/Electrolytes/Nutrition: Monitor I&O with f/u labs in a.m.              -regular diet + Ensure, eating well   - 10-25: Electrolytes, magnesium within normal limits.  Patient asked  multiple times about magnesium repletion, reassured her that it is in her normal range.   8. Muscle spacticity/CP: Resumed dantrolene 25 mg BID on 10/10.  Flexeril  5 mg TID              - Monitor CBC and CMP             Will titrate dantrolene up tomorrow to TID  10/17- increased Dantrolene to 25 mg TID today- encouraged pt to take, since her spasticity is so severe.   10/18-19-- no significant change at this point. Increase dantrium to 25mg  qid. Pt is on board. Will need further titration if tolerated.   10/24 tolerating the dantrium increase to 50 TID. But I agree with her that there hasn't been dramatic change. Will increase to 50mg  qid today. We discussed other options as an outpt which could include botox and a baclofen  pump. Check LFT's Monday. 10-26: Tone not bothersome over this weekend, no gross spasms  10/27- Tone is down to 3-4 from a strong 4 prior- is looser- will wait to increase Dantrolene since only been 3 days since last increased.  9.  Zoster/V1 distribution:  Completed 14-day course of Valtrex on 10/11 but would benefit from zoster VAX.  Outpatient follow-up with ophthalmology d/t elevated IOP on 9/27.             - Continue as needed eyedrops    10/23-24 eye drops helping, eye/skin looks much better .  9. HLD: Lipitor 10 mg    10. Hx of Chronic Diarrhea:  monitor potassium and magnesium labs, continue Imodium  2 mg daily.     - 10/25: 5x liquid BM overnight; add back immodium PRN as appears it was Dced-patient request this to be scheduled twice daily.-  No leukocytosis or fevers to indicate infectious cause.  10-26: No further bowel movements.  Reduce Imodium  to 2 mg daily    11. Cause of spasticity?  10/17- when I got approval to bring pt to CIR, the insurance peer to peer asked that I work up more what might be causing pt's increased spasticity- I ordered CT of Chest, abd and pelvis- and if that is not fruitful, will d/w Neurology if they have any ideas- Peer to peer wanted  an LP. CT not resulted yet- does have severe canal stenosis at L1/2 but wouldn't explain her spasticity, just weakness. Per chart, pt refused surgery- will d/w pt again next Monday  10/18 CT of abdomen and pelvis really unremarkable. She has one 8mm nodule in RUL of lung with follow up recommended in ~6-12 mos  10/24 we spoke with neurology yesterday who had nothing further to offer. Recommend f/u with neuro as outpt as well as other interventions I mentioned above in #8   12. Azotemia     10/19 pt with hx of CKD III per chart.    -may not be too far from baseline   -we discussed pushing fluids today--labs tomorrow  10/22 have reassured pt that renal function was stable.    -she's drinking plenty   10/23 renal fxn stable  10-25: Renal function improving  10/27- BUN up to 39- will replete with IVFs today after therapy and recheck in AM.  12. Anemia  -drop to 10.7 today--no signs of blood loss, asymptomatic   10/24-recheck on Monday           -  B12 and Folate as well as iron panel reviewed and iron level borderline, TIBC low   -begin scheduled ferrous sulfate  10-26: Patient inquires about resumption of B12 supplementation.  Since within normal limits, defer to primary team.    I spent a total  of  38  minutes on total care today- >50% coordination of care- due to  D/w pt about her spasticity- as well as her renal function- didn't have labs back when saw pt also d/w nursing- went over labs, vitals and orders LOS: 11 days A FACE TO FACE EVALUATION WAS PERFORMED  Kathy Owens 08/15/2024, 9:14 AM

## 2024-08-16 LAB — BASIC METABOLIC PANEL WITH GFR
Anion gap: 10 (ref 5–15)
BUN: 31 mg/dL — ABNORMAL HIGH (ref 8–23)
CO2: 19 mmol/L — ABNORMAL LOW (ref 22–32)
Calcium: 8.3 mg/dL — ABNORMAL LOW (ref 8.9–10.3)
Chloride: 108 mmol/L (ref 98–111)
Creatinine, Ser: 0.8 mg/dL (ref 0.44–1.00)
GFR, Estimated: >60 mL/min (ref >60)
Glucose, Bld: 103 mg/dL — ABNORMAL HIGH (ref 70–99)
Potassium: 3.8 mmol/L (ref 3.5–5.1)
Sodium: 137 mmol/L (ref 135–145)

## 2024-08-16 NOTE — Progress Notes (Signed)
 Physical Therapy Session Note  Patient Details  Name: Kathy Owens MRN: [id number] Date of Birth: 1956-10-03  Today's Date: 08/16/2024 PT Individual Time: 1000-1100, 1415-1530 PT Individual Time Calculation (min): 60 min, 75 min   Short Term Goals: Week 2:  PT Short Term Goal 1 (Week 2): pt will initiate SPT training PT Short Term Goal 2 (Week 2): Pt will navigate PWC >5 min without need for cueing PT Short Term Goal 3 (Week 2): pt will perform bed mobiltiy with assist of 1  Skilled Therapeutic Interventions/Progress Updates:    Session 1:  Pt seated in w/c on arrival and agreeable to therapy. No complaint of pain. Pt navigated PWC with supervision and min-mod VC. Returned to room after session in the same manner and remained with needs in reach.   Pt seen today for w/c eval with Penne Matsu, ATP present. Found the below described specialty w/c to be most appropriate: Ms. Livesey requires a power mobility base d/t functional deficits related to spastic diplegia and extreme tone in her trunk and lower body. She is unable to functionally ambulate at this time, requiring max a of 2 people for any standing mobility. She is unable to propel a manual chair d/t UE weakness and a R shoulder bone spur that severely limits ROM. A manual chair would also be unsafe as her tone frequently pushes her hips forward. Because of her extensor tone, she will require both tilt and recline to accommodate variable spasticity and assist in positioning in the chair, as well as pressure relief. She requires elevating leg rests to accommodate her tone in various positions but still allow her to stand to transfer. She will benefit from the described back rest to help support her trunk in midline and prevent listing to one side. She requires a seat elevator to help her reach upper cabinets and other areas of her home due to difficulty standing and poor shoulder strength/ROM. A seat elevator will also assist in standing  via a higher surface and improve ability to sit down in chair to a lower surface. She will use the described tray to carry items and keep her needs in reach d/t poor dexterity and difficulty reaching. She requires an anti thrust cushion with incontinence cover to aid in positioning and allow for easy cleaning related to incontinence. A lower level chair will not meet Ms. Medel needs as described.    Session 2: Pt seated in w/c on arrival and agreeable to therapy. No complaint of pain. Pt provided information for PWC LMN. Pt then navigated PWC with max cueing for several near collisions while in room, supervision once in hallway. Prepared for Stand pivot transfer practice, but pt found to have soiled brief. Returned to room dependently for time. Pt assisted with max a x 2 Stand pivot transfer to EOB with incr time. Tot a for stand>sit d/t tone and fatigue after transfer. MD in/out to discuss POC options including surgery to improve weakness. Pt required tot a x 2 for sit>supine. Pt participated in rolling with max a for dependent pericare and brief change. Pt with heavily saturated brief, reports she doesn't want to call everytime she urinates since it is so frequent. Provided education that pt already has MASD and needs to ensure her skin stays dry. Pt expressed understanding. Doffed shirt d/t soiling and donned gown with max a. Pt remained in bed and was left with all needs in reach and alarm active. Prevalon boots in place.   Therapy Documentation  Precautions:  Precautions Precautions: Fall Recall of Precautions/Restrictions: Intact Precaution/Restrictions Comments: hx of CP with spasticity Restrictions Weight Bearing Restrictions Per Provider Order: No General:       Therapy/Group: Individual Therapy  [name] 08/16/2024, 1:01 PM

## 2024-08-16 NOTE — Progress Notes (Signed)
 Physical Therapy Note  Patient Details  Name: Delanee K Duquette MRN: [id number] Date of Birth: 1956/09/09 Today's Date: 08/16/2024    Physical Therapist participated in the interdisciplinary team conference, providing clinical information regarding the patient's current status, treatment goals, and weekly focus, including any barriers that need to be addressed. Please see the Inpatient Rehabilitation Team Conference and Plan of Care Update for further details.    [name] 08/16/2024, 4:47 PM

## 2024-08-16 NOTE — Progress Notes (Signed)
 Occupational Therapy Session Note  Patient Details  Name: Tyreka K Troiani MRN: [id number] Date of Birth: [birth year]  Today's Date: 08/16/2024 OT Individual Time: 0900-1000 OT Individual Time Calculation (min): 60 min    Short Term Goals: Week 2:  OT Short Term Goal 1 (Week 2): PT will don shirt with mod A in unsupported sitting OT Short Term Goal 2 (Week 2): Pt don LB clothing with mod A OT Short Term Goal 3 (Week 2): Pt will performed supine to sitting EOB with mod A  Skilled Therapeutic Interventions/Progress Updates:    Skilled OT intervention with focus on bed mobility, sitting balance, sit<>stand, PWC mobility, and safewty awareness to increase independence with bADLs. Max A for supine>sit EOB with extra time. Sitting balance with mod A at EOB. Pt requires extra time for extensor tone to partially decrease to facilitate pt able to sit with BLE on ground. Sit<>stand with min A and extra time. Tot A for donning pants at bed level and max A for standing to pull pants over hips. Extensor tone continues to be limiting factor in pt's progress. Pt remained in PWC with seat belt secure. All needs within reach.   Therapy Documentation Precautions:  Precautions Precautions: Fall Recall of Precautions/Restrictions: Intact Precaution/Restrictions Comments: hx of CP with spasticity Restrictions Weight Bearing Restrictions Per Provider Order: No    Pain:  Pt denies pain this morning   Therapy/Group: Individual Therapy  [name] 08/16/2024, 12:33 PM

## 2024-08-16 NOTE — Progress Notes (Signed)
 Patient ID: Kathy Owens, female   DOB: Mar 13, 1956, 68 y.o.   MRN: [id number]  SW met with pt to discuss updates from team conference, and discuss concerns reported by team in which a SNF is recommended due to the amount of physical support she will require. Several attempts to contact sister during this conversation were unsuccessful. Pt does not like th idea of SNF SW shared services able to out in place such as HH therapy, PCS referral, and submitted CAP/DA referral. SW will follow-up tomorrow about her decision.   Graeme Jude, MSW, LCSW Office: [phone] Cell: [phone] Fax: [phone]

## 2024-08-16 NOTE — Patient Care Conference (Signed)
 Inpatient RehabilitationTeam Conference and Plan of Care Update Date: 08/16/2024   Time: 1128 am    Patient Name: Kathy Owens      Medical Record Number: [id number]  Date of Birth: 1956/08/27 Sex: Female         Room/Bed: 4W25C/4W25C-01 Payor Info: Payor: [employer] / Plan: DREMA DUAL COMPLETE / Product Type: *No Product type* /    Admit Date/Time:  08/04/2024  2:22 PM  Primary Diagnosis:  Cerebral palsy, diplegic [facility])  Hospital Problems: Principal Problem:   Cerebral palsy, diplegic (HCC) Active Problems:   Muscle spasticity   History of progressive weakness   Coping style affecting medical condition    Expected Discharge Date: Expected Discharge Date: 08/23/24  Team Members Present: Physician leading conference: Dr. Duwaine Barrs Social Worker Present: Graeme Jude, LCSW Nurse Present: Eulalio Falls, RN PT Present: Schuyler Batter, PT OT Present: Charlena Cha, Darrol Hoe, OT PPS Coordinator present : Eleanor Colon, SLP     Current Status/Progress Goal Weekly Team Focus  Bowel/Bladder   Patient is incontinent of bowel and bladder. Last BM 10/25.   Assist patient in toileting needs. Offer laxative if no bowel movement on the third day.   Monitor for constipation and bladder retention. Assess bowel and bladder function.    Swallow/Nutrition/ Hydration               ADL's   BADLs-max A; sit>stand-min A; transfers with RW-min A; extra time for all transitional movements, bed mobility-max A, toileting-tot A   min A overall   BADLs, transfers, education, toileting    Mobility   max of 2 for bed mobiltiy with extensor tone, stedy stand with near CGA, uses tone to stand, gait up to 29 ft with max x 2   CGA transfers with LRAD, supervision w/c  transfer training, tone management.    Communication                Safety/Cognition/ Behavioral Observations               Pain   Generalized pain 1/10.   Pain less than  2.   Assess pain q shift and prn.    Skin   Skin is clean, dry and intact.   Reposition patient q 2 hours to prevent skin breakdown.  Assess skin q shift and prn.      Discharge Planning:  Pt to d/c to home with alone; sister will help at this time as she lives in her home. W/c eval on Tuesday with [employer]. Family edu on Wed 1pm-4pm. SW will confirm there are no barriers to discharge.    Team Discussion: Patient admitted post cerebral palsy with severe spasticity. MD continue to adjust medications for ongoing spasticity. Patient's progress limited by significant extensor tone,  lower extremity weakness and incontinence of bowel and bladder.   Patient on target to meet rehab goals: Currently patient needs max assistance with ADLs. Patient transfers with minimal assistance using a RW. Patient was able to ambulate up to 75' with max assist x 2. Overall goals at discharge are set for min assistance.  *See Care Plan and progress notes for long and short-term goals.   Revisions to Treatment Plan:  Power wheelchair Stedy for transfers Timed toileting  Teaching Needs: Safety, medications, transfers, toileting, increase water intake, etc.   Current Barriers to Discharge: Decreased caregiver support, Home enviroment access/layout, and Incontinence  Possible Resolutions to Barriers: Family Education Possible SNF placement  Medical Summary Current Status: not drinking- BUN got up to 39- IVF's - BUN down to 31   MASD perinium; urinary incontinence-  Barriers to Discharge: Behavior/Mood;Spasticity;Self-care education;Neurogenic Bowel & Bladder;Incontinence;Medical stability  Barriers to Discharge Comments: limited by severe spasticity, LE weakness;- MAS of 4-  inconitnence of B/B- - don't see how sister can care for her at home- d/c date 11/4 or SNF Possible Resolutions to [employer] Focus: referral to CAP; PCS worker, trying to get- d/c date 11/4 or SNF   Continued Need for  Acute Rehabilitation Level of Care: The patient requires daily medical management by a physician with specialized training in physical medicine and rehabilitation for the following reasons: Direction of a multidisciplinary physical rehabilitation program to maximize functional independence : Yes Medical management of patient stability for increased activity during participation in an intensive rehabilitation regime.: Yes Analysis of laboratory values and/or radiology reports with any subsequent need for medication adjustment and/or medical intervention. : Yes   I attest that I was present, lead the team conference, and concur with the assessment and plan of the team.   Keleigh Kazee Gayo 08/16/2024, 1128 am

## 2024-08-16 NOTE — Progress Notes (Signed)
 PROGRESS NOTE   Subjective/Complaints:   Pt reports there's no room in apartment for power w/c-  really feels needs to go home walking- said apartment is size of postage stamp.   Admits drinking 1-2 cups/water/day- surrounded by sodas- 3-4 unopened; 1-2 open.  Says is using [employer] for transfers.   Didn't get IVFs til 8pm per pt.  Doesn't want supervision to eat- will see why getting Supervision- SLP not involved.   LBM 10/25-   ROS: per HPI   Pt denies SOB, abd pain, CP, N/V/C/D, and vision changes   Objective:   No results found.  Recent Labs    08/13/24 1319 08/15/24 0507  WBC 6.2 5.8  HGB 11.8* 10.8*  HCT 35.4* 33.0*  PLT 221 194   Recent Labs    08/15/24 0507 08/16/24 0546  NA 140 137  K 3.9 3.8  CL 110 108  CO2 20* 19*  GLUCOSE 109* 103*  BUN 39* 31*  CREATININE 0.96 0.80  CALCIUM  8.5* 8.3*    Intake/Output Summary (Last 24 hours) at 08/16/2024 0752 Last data filed at 08/15/2024 1900 Gross per 24 hour  Intake 708 ml  Output --  Net 708 ml        Physical Exam: Vital Signs Blood pressure 115/75, pulse 66, temperature (!) 97.5 F (36.4 C), resp. rate 16, height 5' (1.524 m), weight 71.6 kg, SpO2 98%.     General: awake, alert, appropriate, sitting up in bed; NAD HENT: conjugate gaze; oropharynx dry CV: regular rate and rhythm; no JVD Pulmonary: CTA B/L; no W/R/R- good air movement GI: soft, NT, ND, (+)BS- hypoactive Psychiatric: appropriate- still joking a lot- less anxious today Neurological: Ox3  MAS of 3-4 in LEs- is improving somewhat.  Ext: no clubbing, cyanosis, or edema Psych: pleasant and cooperative  Skin: Left V1 rash--resolved  Neurological: Alert and oriented x 3. Normal insight and awareness. Intact Memory. Normal language, slight dysarthria. Cranial nerve exam unremarkable.     UE strength RUE- 4-4+ proximally, Grip 4-/5, FA 3-/5 LUE- Deltoid, biceps, triceps 4/5-  WE 4-/5; grip 3+/5 and FA 2/5 RLE-tone 3 prox to 3 to 4 distally LLE- tone- proximally -->dis  3+--> 3+ to 4  Physical exam unchanged from the above on reexamination 08/16/24     Assessment/Plan: 1. Functional deficits which require 3+ hours per day of interdisciplinary therapy in a comprehensive inpatient rehab setting. Physiatrist is providing close team supervision and 24 hour management of active medical problems listed below. Physiatrist and rehab team continue to assess barriers to discharge/monitor patient progress toward functional and medical goals  Care Tool:  Bathing    Body parts bathed by patient: Face, Chest, Abdomen, Right arm, Left arm, Right upper leg, Left upper leg   Body parts bathed by helper: Right lower leg, Left lower leg, Front perineal area, Buttocks     Bathing assist Assist Level: Moderate Assistance - Patient 50 - 74%     Upper Body Dressing/Undressing Upper body dressing   What is the patient wearing?: Pull over shirt    Upper body assist Assist Level: Minimal Assistance - Patient > 75%    Lower Body Dressing/Undressing Lower body dressing  What is the patient wearing?: Pants     Lower body assist Assist for lower body dressing: Total Assistance - Patient < 25%     Toileting Toileting    Toileting assist Assist for toileting: 2 Helpers     Transfers Chair/bed transfer  Transfers assist     Chair/bed transfer assist level: Minimal Assistance - Patient > 75%     Locomotion Ambulation   Ambulation assist      Assist level: Moderate Assistance - Patient 50 - 74% Assistive device: Parallel bars Max distance: 3   Walk 10 feet activity   Assist  Walk 10 feet activity did not occur: Safety/medical concerns        Walk 50 feet activity   Assist Walk 50 feet with 2 turns activity did not occur: Safety/medical concerns         Walk 150 feet activity   Assist Walk 150 feet activity did not occur:  Safety/medical concerns         Walk 10 feet on uneven surface  activity   Assist Walk 10 feet on uneven surfaces activity did not occur: Safety/medical concerns         Wheelchair     Assist Is the patient using a wheelchair?: Yes Type of Wheelchair: Manual    Wheelchair assist level: Dependent - Patient 0% Max wheelchair distance: 150 ft    Wheelchair 50 feet with 2 turns activity    Assist        Assist Level: Dependent - Patient 0%   Wheelchair 150 feet activity     Assist      Assist Level: Dependent - Patient 0%   Blood pressure 115/75, pulse 66, temperature (!) 97.5 F (36.4 C), resp. rate 16, height 5' (1.524 m), weight 71.6 kg, SpO2 98%.  Medical Problem List and Plan: 1. Functional deficits secondary to cerebral palsy/ massive increase in spasticity             -patient may  shower             -ELOS/Goals: 3-4 weeks- min-mod A hopefully            - w/c evaluation while here  -Con't CIR PT and OT  -will check on if needs supervision for meals- pt doesn't want  Will con't IVFs to finish 16 hours of IVFs- didn't get started til 8pm last night  I am worried that pt's power w/c won't fit in home-might need manual w/c-  I'm also concerned pt won't be near baseline at d/c- needs more assistance- using Stedy to transfer.   - 10-26: Patient endorses more familiarity with prior knob joints stick then current U-shaped one.  Defer discussion to primary team on possible wheelchair adjustment/evaluation  2.  Antithrombotics: -DVT/anticoagulation:  Pharmaceutical: Xarelto             -antiplatelet therapy: n/a   3. Pain Management: Hx of chronic pain d/t cerebral palsy-continue on home hydrocodone -acetaminophen  5-3 25 as needed   4. Mood/Behavior/Sleep: LCSW to follow for evaluation and support when available.              -antipsychotic agents: n/a   5. Neuropsych/cognition: This patient is capable of making decisions on her own behalf.   6.  Skin/Wound Care: routine pressure relief measures.             -monitor lesion over V1 distribution on left-continue Eucerin    10/20- will con't Rewetting drops frequently for L eye-  due to dryness 7. Fluids/Electrolytes/Nutrition: Monitor I&O with f/u labs in a.m.              -regular diet + Ensure, eating well   - 10-25: Electrolytes, magnesium within normal limits.  Patient asked multiple times about magnesium repletion, reassured her that it is in her normal range.   8. Muscle spacticity/CP: Resumed dantrolene 25 mg BID on 10/10. Flexeril  5 mg TID              - Monitor CBC and CMP             Will titrate dantrolene up tomorrow to TID  10/17- increased Dantrolene to 25 mg TID today- encouraged pt to take, since her spasticity is so severe.   10/18-19-- no significant change at this point. Increase dantrium to 25mg  qid. Pt is on board. Will need further titration if tolerated.   10/24 tolerating the dantrium increase to 50 TID. But I agree with her that there hasn't been dramatic change. Will increase to 50mg  qid today. We discussed other options as an outpt which could include botox and a baclofen  pump. Check LFT's Monday. 10-26: Tone not bothersome over this weekend, no gross spasms  10/27- Tone is down to 3-4 from a strong 4 prior- is looser- will wait to increase Dantrolene since only been 3 days since last increased.   10/28- will increase Thursday 9.  Zoster/V1 distribution:  Completed 14-day course of Valtrex on 10/11 but would benefit from zoster VAX.  Outpatient follow-up with ophthalmology d/t elevated IOP on 9/27.             - Continue as needed eyedrops    10/23-24 eye drops helping, eye/skin looks much better .  9. HLD: Lipitor 10 mg    10. Hx of Chronic Diarrhea:  monitor potassium and magnesium labs, continue Imodium  2 mg daily.     - 10/25: 5x liquid BM overnight; add back immodium PRN as appears it was Dced-patient request this to be scheduled twice daily.-  No  leukocytosis or fevers to indicate infectious cause.  10-26: No further bowel movements.  Reduce Imodium  to 2 mg daily  10/28- LBM 10/25- might need bowel intervention- will give her day then do Sorbitol   11. Cause of spasticity?  10/17- when I got approval to bring pt to CIR, the insurance peer to peer asked that I work up more what might be causing pt's increased spasticity- I ordered CT of Chest, abd and pelvis- and if that is not fruitful, will d/w Neurology if they have any ideas- Peer to peer wanted an LP. CT not resulted yet- does have severe canal stenosis at L1/2 but wouldn't explain her spasticity, just weakness. Per chart, pt refused surgery- will d/w pt again next Monday  10/18 CT of abdomen and pelvis really unremarkable. She has one 8mm nodule in RUL of lung with follow up recommended in ~6-12 mos  10/24 we spoke with neurology yesterday who had nothing further to offer. Recommend f/u with neuro as outpt as well as other interventions I mentioned above in #8  12. Azotemia     10/19 pt with hx of CKD III per chart.    -may not be too far from baseline   -we discussed pushing fluids today--labs tomorrow  10/22 have reassured pt that renal function was stable.    -she's drinking plenty   10/23 renal fxn stable  10-25: Renal function improving  10/27- BUN up to  30- will replete with IVFs today after therapy and recheck in AM.   10/28- BUN down to 31- started IVFs at 8pm last night- so needs 16 hours of fluids- also pt drinking 1-2 cups/water/day- we discussed at length about drinking 6-8 cups/day- every day 12. Anemia  -drop to 10.7 today--no signs of blood loss, asymptomatic   10/24-recheck on Monday           -  B12 and Folate as well as iron panel reviewed and iron level borderline, TIBC low   -begin scheduled ferrous sulfate  10-26: Patient inquires about resumption of B12 supplementation.  Since within normal limits, defer to primary team.     I spent a total of 51    minutes on total care today- >50% coordination of care- due to  Team conference today to determine LOS; and to discuss dispo- - also to review PO intake and to limit sodas; also will d/w team about supervision to meals.    LOS: 12 days A FACE TO FACE EVALUATION WAS PERFORMED  Makennah Omura 08/16/2024, 7:52 AM

## 2024-08-16 NOTE — Progress Notes (Addendum)
 Occupational Therapy Note  Patient Details  Name: Jaunita K Nabor MRN: [id number] Date of Birth: [birth year]  Occupational Therapy Assistant participated in the interdisciplinary team conference, providing clinical information regarding the patient's current status, treatment goals, and weekly focus, including any barriers that need to be addressed. Please see the Inpatient Rehabilitation Team Conference and Plan of Care Update for further details.    Maritza Ned [facility] 08/16/2024, 12:11 PM

## 2024-08-17 MED ORDER — DANTROLENE SODIUM 100 MG PO CAPS
100.0000 mg | ORAL_CAPSULE | Freq: Three times a day (TID) | ORAL | Status: DC
  Administered 2024-08-17 – 2024-08-23 (×18): 100 mg via ORAL
  Filled 2024-08-17 (×19): qty 1

## 2024-08-17 MED ORDER — POLYETHYLENE GLYCOL 3350 17 G PO PACK
17.0000 g | PACK | Freq: Once | ORAL | Status: AC
  Administered 2024-08-17: 17 g via ORAL

## 2024-08-17 NOTE — Progress Notes (Signed)
 Occupational Therapy Session Note  Patient Details  Name: Kathy Owens MRN: [id number] Date of Birth: June 29, 1956  Today's Date: 08/17/2024 OT Individual Time: 1050-1200 OT Individual Time Calculation (min): 70 min    Short Term Goals: Week 2:  OT Short Term Goal 1 (Week 2): PT will don shirt with mod A in unsupported sitting OT Short Term Goal 2 (Week 2): Pt don LB clothing with mod A OT Short Term Goal 3 (Week 2): Pt will performed supine to sitting EOB with mod A  Skilled Therapeutic Interventions/Progress Updates:  Pt greeted supine in bed, pt agreeable to OT intervention.      Transfers/bed mobility/functional mobility:  Pt completed supine>sit with MAX A to elevate trunk and maneuver BLEs to EOB. Pt completed sit>stand to stedy with MIN A, dependent transfer into PWC.   Pt completed w/c mobility with PWC with supervision. Min cues to manage obstacles, pt does need assistance backing her chair into narrow spaces.   Pt completed x3 sit>stands from [facility] with MINA +2 for safety, pt required assist to scoot back in chair every time.   Pt completed ~ 4 ft of functional ambulation with MIN A +2 with close w/c follow, pt ambulates with increased extensor tone and keeps LLE adducted. Pt seems to have developed compensations that work well for her but are unconventional.   ADLs:  UB dressing:donned [place] shirt with MIN A from EOB                 Ended session with pt seated in PWC in reclined position with all needs within reach and safety belt alarm activated.                    Therapy Documentation Precautions:  Precautions Precautions: Fall Recall of Precautions/Restrictions: Intact Precaution/Restrictions Comments: hx of CP with spasticity Restrictions Weight Bearing Restrictions Per Provider Order: No  Pain: No pain indicated  Therapy/Group: Individual Therapy  Ronal Mallie Needy 08/17/2024, 12:26 PM

## 2024-08-17 NOTE — Progress Notes (Signed)
 PROGRESS NOTE   Subjective/Complaints:  LBM small yesterday AM- type 3.  Pt and I talked about her feeling like she hasn't made many gains since here.   I explained that she has significant compression at L1/2- which is the reason she's so weak- I cannot explain the spasticity that's so much worse, but it could be compensatory.   Pt still emphatic doesn't want back surgery, not thrilled about w/c- feels she can walk on her own with RW. I explained we need to be safe, or she could fall.   She's understandably distraught over this- and  tearful throughout discussion today.   Admits she tried to drink a lot yesterday- said she peed all day.     ROS: per HPI   Pt denies SOB, abd pain, CP, N/V/C/D, and vision changes No significant change in spasticity- still severe (+)   Objective:   No results found.  Recent Labs    08/15/24 0507  WBC 5.8  HGB 10.8*  HCT 33.0*  PLT 194   Recent Labs    08/15/24 0507 08/16/24 0546  NA 140 137  K 3.9 3.8  CL 110 108  CO2 20* 19*  GLUCOSE 109* 103*  BUN 39* 31*  CREATININE 0.96 0.80  CALCIUM  8.5* 8.3*    Intake/Output Summary (Last 24 hours) at 08/17/2024 0807 Last data filed at 08/16/2024 1854 Gross per 24 hour  Intake 360 ml  Output --  Net 360 ml        Physical Exam: Vital Signs Blood pressure (!) 129/58, pulse 71, temperature 98.1 F (36.7 C), temperature source Oral, resp. rate 16, height 5' (1.524 m), weight 71.6 kg, SpO2 98%.     General: awake, alert, tearful; sitting up in bed; ate 95% of tray;  NAD HENT: conjugate gaze; oropharynx moist CV: regular rate and rhythm; no JVD Pulmonary: CTA B/L; no W/R/R- good air movement GI: soft, NT, ND, (+)BS- hypoactive Psychiatric: appropriate but tearful most of time I saw her and joking to try and cover sadness Neurological: Ox3 MAS of 4 this AM- still less than when started- can actually somewhat bend  LEs- at knees and ankles- harder in hips- but not smooth- could be dystonia, but so severe, hard to determine if velocity dependent.   Ext: no clubbing, cyanosis, or edema Psych: pleasant and cooperative  Skin: Left V1 rash--resolved  Neurological: Alert and oriented x 3. Normal insight and awareness. Intact Memory. Normal language, slight dysarthria. Cranial nerve exam unremarkable.     UE strength RUE- 4-4+ proximally, Grip 4-/5, FA 3-/5 LUE- Deltoid, biceps, triceps 4/5- WE 4-/5; grip 3+/5 and FA 2/5 RLE-tone 3 prox to 3 to 4 distally LLE- tone- proximally -->dis  3+--> 3+ to 4  Physical exam unchanged from the above on reexamination 08/17/24     Assessment/Plan: 1. Functional deficits which require 3+ hours per day of interdisciplinary therapy in a comprehensive inpatient rehab setting. Physiatrist is providing close team supervision and 24 hour management of active medical problems listed below. Physiatrist and rehab team continue to assess barriers to discharge/monitor patient progress toward functional and medical goals  Care Tool:  Bathing    Body parts  bathed by patient: Face, Chest, Abdomen, Right arm, Left arm, Right upper leg, Left upper leg   Body parts bathed by helper: Right lower leg, Left lower leg, Front perineal area, Buttocks     Bathing assist Assist Level: Moderate Assistance - Patient 50 - 74%     Upper Body Dressing/Undressing Upper body dressing   What is the patient wearing?: Pull over shirt    Upper body assist Assist Level: Minimal Assistance - Patient > 75%    Lower Body Dressing/Undressing Lower body dressing      What is the patient wearing?: Pants     Lower body assist Assist for lower body dressing: Total Assistance - Patient < 25%     Toileting Toileting    Toileting assist Assist for toileting: 2 Helpers     Transfers Chair/bed transfer  Transfers assist     Chair/bed transfer assist level: Minimal Assistance - Patient >  75%     Locomotion Ambulation   Ambulation assist      Assist level: Moderate Assistance - Patient 50 - 74% Assistive device: Parallel bars Max distance: 3   Walk 10 feet activity   Assist  Walk 10 feet activity did not occur: Safety/medical concerns        Walk 50 feet activity   Assist Walk 50 feet with 2 turns activity did not occur: Safety/medical concerns         Walk 150 feet activity   Assist Walk 150 feet activity did not occur: Safety/medical concerns         Walk 10 feet on uneven surface  activity   Assist Walk 10 feet on uneven surfaces activity did not occur: Safety/medical concerns         Wheelchair     Assist Is the patient using a wheelchair?: Yes Type of Wheelchair: Manual    Wheelchair assist level: Dependent - Patient 0% Max wheelchair distance: 150 ft    Wheelchair 50 feet with 2 turns activity    Assist        Assist Level: Dependent - Patient 0%   Wheelchair 150 feet activity     Assist      Assist Level: Dependent - Patient 0%   Blood pressure (!) 129/58, pulse 71, temperature 98.1 F (36.7 C), temperature source Oral, resp. rate 16, height 5' (1.524 m), weight 71.6 kg, SpO2 98%.  Medical Problem List and Plan: 1. Functional deficits secondary to cerebral palsy/ massive increase in spasticity             -patient may  shower             -ELOS/Goals: 3-4 weeks- min-mod A hopefully            - w/c evaluation while here  Con't CIR PT and OT Pt emphatic no back surgery, not thrilled with power w/c- but per PT, have no other choice. Pt also insistent going home on d/c date 11/4   Stopped supervision with meals - 10-26: Patient endorses more familiarity with prior knob joints stick then current U-shaped one.  Defer discussion to primary team on possible wheelchair adjustment/evaluation  2.  Antithrombotics: -DVT/anticoagulation:  Pharmaceutical: Xarelto             -antiplatelet therapy: n/a    3. Pain Management: Hx of chronic pain d/t cerebral palsy-continue on home hydrocodone -acetaminophen  5-3 25 as needed   4. Mood/Behavior/Sleep: LCSW to follow for evaluation and support when available.              -  antipsychotic agents: n/a   5. Neuropsych/cognition: This patient is capable of making decisions on her own behalf.   6. Skin/Wound Care: routine pressure relief measures.             -monitor lesion over V1 distribution on left-continue Eucerin    10/20- will con't Rewetting drops frequently for L eye- due to dryness 7. Fluids/Electrolytes/Nutrition: Monitor I&O with f/u labs in a.m.              -regular diet + Ensure, eating well   - 10-25: Electrolytes, magnesium within normal limits.  Patient asked multiple times about magnesium repletion, reassured her that it is in her normal range.   8. Muscle spacticity/CP: Resumed dantrolene 25 mg BID on 10/10. Flexeril  5 mg TID              - Monitor CBC and CMP             Will titrate dantrolene up tomorrow to TID  10/17- increased Dantrolene to 25 mg TID today- encouraged pt to take, since her spasticity is so severe.   10/18-19-- no significant change at this point. Increase dantrium to 25mg  qid. Pt is on board. Will need further titration if tolerated.   10/24 tolerating the dantrium increase to 50 TID. But I agree with her that there hasn't been dramatic change. Will increase to 50mg  qid today. We discussed other options as an outpt which could include botox and a baclofen  pump. Check LFT's Monday. 10-26: Tone not bothersome over this weekend, no gross spasms  10/27- Tone is down to 3-4 from a strong 4 prior- is looser- will wait to increase Dantrolene since only been 3 days since last increased.   10/29- increased Dantrolene to 100 mg TID- pt cannot tolerate Baclofen - caused hallucinations- could do Zanaflex , but concerned could drop BP- if Dantrolene not enough, could do low dose Botulinum toxin  9.  Zoster/V1 distribution:   Completed 14-day course of Valtrex on 10/11 but would benefit from zoster VAX.  Outpatient follow-up with ophthalmology d/t elevated IOP on 9/27.             - Continue as needed eyedrops    10/23-24 eye drops helping, eye/skin looks much better .  9. HLD: Lipitor 10 mg    10. Hx of Chronic Diarrhea:  monitor potassium and magnesium labs, continue Imodium  2 mg daily.     - 10/25: 5x liquid BM overnight; add back immodium PRN as appears it was Dced-patient request this to be scheduled twice daily.-  No leukocytosis or fevers to indicate infectious cause.  10-26: No further bowel movements.  Reduce Imodium  to 2 mg daily  10/28- LBM 10/25- might need bowel intervention- will give her day then do Sorbitol   10/29- Small BM yesterday- needs Sorbitol -will try Miralax x1  11. Cause of spasticity?  10/17- when I got approval to bring pt to CIR, the insurance peer to peer asked that I work up more what might be causing pt's increased spasticity- I ordered CT of Chest, abd and pelvis- and if that is not fruitful, will d/w Neurology if they have any ideas- Peer to peer wanted an LP. CT not resulted yet- does have severe canal stenosis at L1/2 but wouldn't explain her spasticity, just weakness. Per chart, pt refused surgery- will d/w pt again next Monday  10/18 CT of abdomen and pelvis really unremarkable. She has one 8mm nodule in RUL of lung with follow up recommended  in ~6-12 mos  10/24 we spoke with neurology yesterday who had nothing further to offer. Recommend f/u with neuro as outpt as well as other interventions I mentioned above in #8  10/29- will increase Dantrolene to 100 mg TID- probably will need Botulinum toxin after d/c? Will d/w Dr Emeline 12. Azotemia     10/19 pt with hx of CKD III per chart.    -may not be too far from baseline   -we discussed pushing fluids today--labs tomorrow  10/22 have reassured pt that renal function was stable.    -she's drinking plenty   10/23 renal fxn  stable  10-25: Renal function improving  10/27- BUN up to 39- will replete with IVFs today after therapy and recheck in AM.   10/28- BUN down to 31- started IVFs at 8pm last night- so needs 16 hours of fluids- also pt drinking 1-2 cups/water/day- we discussed at length about drinking 6-8 cups/day- every day 12. Anemia  -drop to 10.7 today--no signs of blood loss, asymptomatic   10/24-recheck on Monday           -  B12 and Folate as well as iron panel reviewed and iron level borderline, TIBC low   -begin scheduled ferrous sulfate  10-26: Patient inquires about resumption of B12 supplementation.  Since within normal limits, defer to primary team.     I spent a total of 54   minutes on total care today- >50% coordination of care- due to   Prolonged d/w pt about her spasticity, severe weakness in LE's 2-/5 to 2/5 at best- which when we take spasticity away, it uncovers her severe weakness-  will d/w pt about Botulinum toxin tomorrow. And see if can do after CIR. Also helped with the grief of her situation.    LOS: 13 days A FACE TO FACE EVALUATION WAS PERFORMED  Kathy Owens 08/17/2024, 8:07 AM
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
## 2024-08-17 NOTE — Progress Notes (Signed)
 Physical Therapy Session Note  Patient Details  Name: Kathy Owens MRN: [id number] Date of Birth: 05-01-1956  Today's Date: 08/17/2024 PT Individual Time: 9069-9044 + 1500-1613 PT Individual Time Calculation (min): 25 min + 73 min  Short Term Goals: Week 2:  PT Short Term Goal 1 (Week 2): pt will initiate SPT training PT Short Term Goal 2 (Week 2): Pt will navigate PWC >5 min without need for cueing PT Short Term Goal 3 (Week 2): pt will perform bed mobiltiy with assist of 1  Skilled Therapeutic Interventions/Progress Updates:      1st session: Pt lying in bed to start - reports she's incontinent of bladder and needs help getting changed/cleaned. Pt assisted with brief change and pericare at bed level, dependent assist for brief change. Pt able to cleanse with wash cloth but needed assist for thoroughness and for drying to help prevent MASD. Applied moister barrier cream as well. Donned paper pants with +2 assist at bed level. Requires max/totalA for rolling in bed. Pt ended treatment with needs met.    2nd session: Pt sitting in CIR's PWC on arrival - her loaner PWC delivered and at the bedside. Assisted patient from CIR PWC to her loaner PWC via [place] with +2 assist. Pt with difficulty sitting from [place] to [facility] due to height, needing to use tilt features and PT lifting legs to help boost her up and back into the wheelchair. Used padded seat belt to help for safety as well.   With positioning, pt may benefit from latera leg blocks (along lateral fibula's) to help keep legs from abducting outwards. Able to manually position feet onto foot pedals but they quickly return to abduction, hanging off side of PWC. Pt already has lateral thigh supports which aid in keeping hips aligned.   Focused session on PWC mobility and training. Reviewed on/off in her new loaner wheelchair and tilt features. Pt practiced driving the PWC around CIR unit, needing intermittent assist for turning through  doorways. Pt with several instances of running into walls and door frames and when she becomes distracted, is more likely to be unsafe; she also has difficulty keeping a straight path in open hallway; will benefit from further practice and training.   Pt returned to her room where she needed totalA to drive PWC safely without bumping into tray table, doors, walls, etc. She ended treatment tilted in PWC, seat belt on, call bell within reach.      Therapy Documentation Precautions:  Precautions Precautions: Fall Recall of Precautions/Restrictions: Intact Precaution/Restrictions Comments: hx of CP with spasticity Restrictions Weight Bearing Restrictions Per Provider Order: No General:      Therapy/Group: Individual Therapy  [name] 08/17/2024, 9:57 AM

## 2024-08-17 NOTE — Progress Notes (Signed)
 Physical Therapy Session Note  Patient Details  Name: Kathy Owens MRN: [id number] Date of Birth: 04-26-1956  Today's Date: 08/17/2024 PT Individual Time: 1335-1400 PT Individual Time Calculation (min): 25 min   Short Term Goals: Week 2:  PT Short Term Goal 1 (Week 2): pt will initiate SPT training PT Short Term Goal 2 (Week 2): Pt will navigate PWC >5 min without need for cueing PT Short Term Goal 3 (Week 2): pt will perform bed mobiltiy with assist of 1  Skilled Therapeutic Interventions/Progress Updates: Pt presented in PWC agreeable to therapy. Pt denies pain during session. Session focused on education and psychosocial support. Pt expressed frustration regarding transition to [facility] and current loss of independence. PTA providing emotional support as well as encouragement regarding not a loss of independence but allowing continued independence at current LOF. PTA also provided some education regarding stenosis with visual aide, explained as per MD note does not indicate why significant increase in spasticity but pt appreciative of visual aide for better understanding. Prior to leaving PTA encouraged fluids and obtained additional water bottle for pt as had none on tray. Pt remained in PWC at end of session and remained in w/c with call bell within reach and needs met.      Therapy Documentation Precautions:  Precautions Precautions: Fall Recall of Precautions/Restrictions: Intact Precaution/Restrictions Comments: hx of CP with spasticity Restrictions Weight Bearing Restrictions Per Provider Order: No General:   Vital Signs: Therapy Vitals Temp: 98.1 F (36.7 C) Temp Source: Oral Pulse Rate: 79 Resp: 18 BP: (!) 143/79 Patient Position (if appropriate): Sitting Oxygen Therapy SpO2: 100 % O2 Device: Room Air   Therapy/Group: Individual Therapy  Merrilee Ancona 08/17/2024, 2:21 PM

## 2024-08-18 LAB — CBC
HCT: 32.5 % — ABNORMAL LOW (ref 36.0–46.0)
Hemoglobin: 10.9 g/dL — ABNORMAL LOW (ref 12.0–15.0)
MCH: 30.4 pg (ref 26.0–34.0)
MCHC: 33.5 g/dL (ref 30.0–36.0)
MCV: 90.5 fL (ref 80.0–100.0)
Platelets: 192 K/uL (ref 150–400)
RBC: 3.59 MIL/uL — ABNORMAL LOW (ref 3.87–5.11)
RDW: 16 % — ABNORMAL HIGH (ref 11.5–15.5)
WBC: 6.4 K/uL (ref 4.0–10.5)
nRBC: 0 % (ref 0.0–0.2)

## 2024-08-18 MED ORDER — ARTIFICIAL TEARS OPHTHALMIC OINT
TOPICAL_OINTMENT | Freq: Three times a day (TID) | OPHTHALMIC | Status: DC | PRN
  Filled 2024-08-18: qty 3.5

## 2024-08-18 MED ORDER — SORBITOL 70 % SOLN
45.0000 mL | Freq: Once | Status: DC
  Filled 2024-08-18: qty 60

## 2024-08-18 NOTE — Plan of Care (Signed)
  Problem: Consults Goal: RH GENERAL PATIENT EDUCATION Description: See Patient Education module for education specifics. Outcome: Progressing   Problem: RH BOWEL ELIMINATION Goal: RH STG MANAGE BOWEL WITH ASSISTANCE Description: STG Manage Bowel with min- mod Assistance. Outcome: Progressing   Problem: RH BLADDER ELIMINATION Goal: RH STG MANAGE BLADDER WITH ASSISTANCE Description: STG Manage Bladder With min-mod Assistance Outcome: Progressing   Problem: RH SKIN INTEGRITY Goal: RH STG SKIN FREE OF INFECTION/BREAKDOWN Description: Manage skin free of infection with min - mod assistance Outcome: Progressing   Problem: RH SAFETY Goal: RH STG ADHERE TO SAFETY PRECAUTIONS W/ASSISTANCE/DEVICE Description: STG Adhere to Safety Precautions With min- mod Assistance/Device. Outcome: Progressing   Problem: RH PAIN MANAGEMENT Goal: RH STG PAIN MANAGED AT OR BELOW PT'S PAIN GOAL Description: < 4 w/ prns Outcome: Progressing   Problem: RH KNOWLEDGE DEFICIT GENERAL Goal: RH STG INCREASE KNOWLEDGE OF SELF CARE AFTER HOSPITALIZATION Description: Manage increase knowledge of self after hospitalization with min- mod assistance from sister using educational materials provided Outcome: Progressing   Problem: Education: Goal: Knowledge of General Education information will improve Description: Including pain rating scale, medication(s)/side effects and non-pharmacologic comfort measures Outcome: Progressing   Problem: Health Behavior/Discharge Planning: Goal: Ability to manage health-related needs will improve Outcome: Progressing   Problem: Clinical Measurements: Goal: Ability to maintain clinical measurements within normal limits will improve Outcome: Progressing Goal: Will remain free from infection Outcome: Progressing Goal: Diagnostic test results will improve Outcome: Progressing Goal: Respiratory complications will improve Outcome: Progressing Goal: Cardiovascular complication  will be avoided Outcome: Progressing   Problem: Activity: Goal: Risk for activity intolerance will decrease Outcome: Progressing   Problem: Nutrition: Goal: Adequate nutrition will be maintained Outcome: Progressing   Problem: Coping: Goal: Level of anxiety will decrease Outcome: Progressing   Problem: Elimination: Goal: Will not experience complications related to bowel motility Outcome: Progressing Goal: Will not experience complications related to urinary retention Outcome: Progressing   Problem: Pain Managment: Goal: General experience of comfort will improve and/or be controlled Outcome: Progressing   Problem: Safety: Goal: Ability to remain free from injury will improve Outcome: Progressing   Problem: Skin Integrity: Goal: Risk for impaired skin integrity will decrease Outcome: Progressing

## 2024-08-18 NOTE — Telephone Encounter (Signed)
 Patient is calling for refill status. She stated that she will be discharged on 08/23/24 and will not have any meds after that. Patient would like a callback.

## 2024-08-18 NOTE — Progress Notes (Signed)
 Occupational Therapy Session Note  Patient Details  Name: Kathy Owens MRN: [id number] Date of Birth: [birth year]  Today's Date: 08/18/2024 OT Individual Time: [phone] OT Individual Time Calculation (min): 37 min    Short Term Goals: Week 2:  OT Short Term Goal 1 (Week 2): PT will don shirt with mod A in unsupported sitting OT Short Term Goal 2 (Week 2): Pt don LB clothing with mod A OT Short Term Goal 3 (Week 2): Pt will performed supine to sitting EOB with mod A  Skilled Therapeutic Interventions/Progress Updates:      Therapy Documentation Precautions:  Precautions Precautions: Fall Recall of Precautions/Restrictions: Intact Precaution/Restrictions Comments: hx of CP with spasticity Restrictions Weight Bearing Restrictions Per Provider Order: No General: Pt supine in bed upon OT arrival, agreeable to OT session.  Pain: no pain reported  Exercises: Pt completed the following exercise circuit in order to improve functional activity, strength and endurance to prepare for ADLs such as bathing. Pt completed the following exercises in 3x10 position with no noted LOB/SOB and 3x10 repetitions on each exercise with yellow theraband or 3# dowel rod and PRN rest breaks: -bicep curls -triceps extensions -shoulder abduction  Other Treatments: OT discussing Lt eye swelling with nsg d/t it impairing sight for increased independence with PWC mobility.    Pt supine in bed with bed alarm activated, 2 bed rails up, call light within reach and 4Ps assessed. Nsg present for medicine.    Therapy/Group: Individual Therapy  [name], OTD, OTR/L 08/18/2024, 9:44 AM

## 2024-08-18 NOTE — Telephone Encounter (Signed)
 [name],Daisy, had called requesting Hydrocodone  refill. I let her know the dosage might change at discharge per Dr Trudy.

## 2024-08-18 NOTE — Progress Notes (Signed)
 Occupational Therapy Session Note  Patient Details  Name: Kathy Owens MRN: [id number] Date of Birth: 1955/11/21  Today's Date: 08/18/2024 OT Individual Time: 1030-1130 OT Individual Time Calculation (min): 60 min    Short Term Goals: Week 2:  OT Short Term Goal 1 (Week 2): PT will don shirt with mod A in unsupported sitting OT Short Term Goal 2 (Week 2): Pt don LB clothing with mod A OT Short Term Goal 3 (Week 2): Pt will performed supine to sitting EOB with mod A  Skilled Therapeutic Interventions/Progress Updates:    Pt on bed pan upon arrival. Linens soiled. Initial focus on bed mobility to facilitate hygiene and donning clean clothing. Rolling R/L in bed with max A using bed rails. Pt required mod A to maintain sidelying. Tot A+2 for donning pants at bed level. Supine>sit with max A+1. In sidelying, pt able to move BLE off EOB but required max A to sit upright EOB Pt requires increased time to move though extensor tone while seated EOB. Pt maintained sitting balance EOB with CGA in preparation for sit<>stand in [place]. Sit<>stand in [place] with CGA. Pt transferred to [facility]. Pt able to scoot back in PWC with min A. PWC mobility with supervision and min verbal cues for safety. Pt reamined in PWC and handed off to PT.  Therapy Documentation Precautions:  Precautions Precautions: Fall Recall of Precautions/Restrictions: Intact Precaution/Restrictions Comments: hx of CP with spasticity Restrictions Weight Bearing Restrictions Per Provider Order: No   Pain:  Pt denies pain this monring   Therapy/Group: Individual Therapy  [name] 08/18/2024, 12:22 PM

## 2024-08-18 NOTE — Progress Notes (Signed)
 Physical Therapy Session Note  Patient Details  Name: Kathy Owens MRN: [id number] Date of Birth: 1956/10/04  Today's Date: 08/18/2024 PT Individual Time: 1130-1200 PT Individual Time Calculation (min): 30 min   Short Term Goals: Week 2:  PT Short Term Goal 1 (Week 2): pt will initiate SPT training PT Short Term Goal 2 (Week 2): Pt will navigate PWC >5 min without need for cueing PT Short Term Goal 3 (Week 2): pt will perform bed mobiltiy with assist of 1  Skilled Therapeutic Interventions/Progress Updates:    Pt recd in PWC as hand off from OT. No complaint of pain. Therapist provided passive stretching in PWC for hamstring and quad tone to improve ability to position in chair and prepare to stand. Rest of session focused on PWC functions and mobility. Discussed ensuring foot plates support feet while moving to prevent sliding forward and down low when preparing to transfer. Pt able to demo. Discussed tilt vs recline and pt able to perform. Pt then navigated weaving through chairs placed ~4 ft apart, required cueing for first 2 and then able to navigate without cueing. Pt does require step by step cueing when in tight spaces and backing up. Pt returned to room and remained in PWC, was left with all needs in reach and alarm active.   Therapy Documentation Precautions:  Precautions Precautions: Fall Recall of Precautions/Restrictions: Intact Precaution/Restrictions Comments: hx of CP with spasticity Restrictions Weight Bearing Restrictions Per Provider Order: No General:       Therapy/Group: Individual Therapy  Kathy Owens 08/18/2024, 3:54 PM

## 2024-08-18 NOTE — Progress Notes (Signed)
 Physical Therapy Session Note  Patient Details  Name: Kathy Owens MRN: [id number] Date of Birth: 1955-10-24  Today's Date: 08/18/2024 PT Individual Time: 0805-0830 PT Individual Time Calculation (min): 25 min   Short Term Goals: Week 2:  PT Short Term Goal 1 (Week 2): pt will initiate SPT training PT Short Term Goal 2 (Week 2): Pt will navigate PWC >5 min without need for cueing PT Short Term Goal 3 (Week 2): pt will perform bed mobiltiy with assist of 1  Skilled Therapeutic Interventions/Progress Updates: Pt presented in bed completing breakfast agreeable to therapy. Pt denies pain during session. Pt requesting to use bathroom therefore nsg notified however did not arrive until end of session. Pt noted that received loaner PWC and was able to drive it yesterday. Pt verbalized understanding that continues to require practice navigating PWC. PTA performed MMT as noted in flowsheet. Upon nsg students arrival pt indicated had already voided therefore provided education to nsg to management with tone. Pt left in bed at end of session with nsg students present for peri care and current needs met.       Therapy Documentation Precautions:  Precautions Precautions: Fall Recall of Precautions/Restrictions: Intact Precaution/Restrictions Comments: hx of CP with spasticity Restrictions Weight Bearing Restrictions Per Provider Order: No   Therapy/Group: Individual Therapy  Kiley Solimine 08/18/2024, 8:59 AM

## 2024-08-18 NOTE — Progress Notes (Signed)
 PROGRESS NOTE   Subjective/Complaints:  Pt reports it's been 4 days since LBM- wants something to go- willing ot take Sorbitol.  We discussed the only way to get stronger in legs is to do back surgery- that her compression -severe canal stenosis is likely why her legs are so weak.   We discussed that I think surgery will stop progression definitively, but probably 70-75% chance things could get better- just cannot say how much.    Also told nurse L eye feels dry and swollen. But after I saw pt- NP going to see pt.     ROS: per HPI   Pt denies SOB, abd pain, CP, N/V/C/D, and vision changes  No significant change in spasticity- still severe (+)   Objective:   No results found.  Recent Labs    08/18/24 0456  WBC 6.4  HGB 10.9*  HCT 32.5*  PLT 192   Recent Labs    08/16/24 0546  NA 137  K 3.8  CL 108  CO2 19*  GLUCOSE 103*  BUN 31*  CREATININE 0.80  CALCIUM  8.3*    Intake/Output Summary (Last 24 hours) at 08/18/2024 1030 Last data filed at 08/17/2024 1800 Gross per 24 hour  Intake 286 ml  Output --  Net 286 ml        Physical Exam: Vital Signs Blood pressure 132/61, pulse 67, temperature 97.8 F (36.6 C), temperature source Oral, resp. rate 18, height 5' (1.524 m), weight 71.6 kg, SpO2 98%.      General: awake, alert, appropriate, NAD HENT: conjugate gaze; oropharynx moist- blinking a little more than expected CV: regular rate and rhythm; no JVD Pulmonary: CTA B/L; no W/R/R- good air movement GI: soft, NT, ND, (+)BS- hypoactive Psychiatric: appropriate- is joking but not tearful today- thoughtful about back surgery Neurological: Ox3  MAS of 4 again- but less than was initially- still less than when started- can actually somewhat bend LEs- at knees and ankles- harder in hips- but not smooth- could be dystonia, but so severe, hard to determine if velocity dependent.   Ext: no clubbing,  cyanosis, or edema Psych: pleasant and cooperative  Skin: Left V1 rash--resolved  Neurological: Alert and oriented x 3. Normal insight and awareness. Intact Memory. Normal language, slight dysarthria. Cranial nerve exam unremarkable.     UE strength RUE- 4-4+ proximally, Grip 4-/5, FA 3-/5 LUE- Deltoid, biceps, triceps 4/5- WE 4-/5; grip 3+/5 and FA 2/5 RLE-tone 3 prox to 3 to 4 distally LLE- tone- proximally -->dis  3+--> 3+ to 4  Physical exam unchanged from the above on reexamination 08/18/24     Assessment/Plan: 1. Functional deficits which require 3+ hours per day of interdisciplinary therapy in a comprehensive inpatient rehab setting. Physiatrist is providing close team supervision and 24 hour management of active medical problems listed below. Physiatrist and rehab team continue to assess barriers to discharge/monitor patient progress toward functional and medical goals  Care Tool:  Bathing    Body parts bathed by patient: Face, Chest, Abdomen, Right arm, Left arm, Right upper leg, Left upper leg   Body parts bathed by helper: Right lower leg, Left lower leg, Front perineal area, Buttocks  Bathing assist Assist Level: Moderate Assistance - Patient 50 - 74%     Upper Body Dressing/Undressing Upper body dressing   What is the patient wearing?: Pull over shirt    Upper body assist Assist Level: Minimal Assistance - Patient > 75%    Lower Body Dressing/Undressing Lower body dressing      What is the patient wearing?: Pants     Lower body assist Assist for lower body dressing: Total Assistance - Patient < 25%     Toileting Toileting    Toileting assist Assist for toileting: 2 Helpers     Transfers Chair/bed transfer  Transfers assist     Chair/bed transfer assist level: Minimal Assistance - Patient > 75%     Locomotion Ambulation   Ambulation assist      Assist level: Moderate Assistance - Patient 50 - 74% Assistive device: Parallel bars Max  distance: 3   Walk 10 feet activity   Assist  Walk 10 feet activity did not occur: Safety/medical concerns        Walk 50 feet activity   Assist Walk 50 feet with 2 turns activity did not occur: Safety/medical concerns         Walk 150 feet activity   Assist Walk 150 feet activity did not occur: Safety/medical concerns         Walk 10 feet on uneven surface  activity   Assist Walk 10 feet on uneven surfaces activity did not occur: Safety/medical concerns         Wheelchair     Assist Is the patient using a wheelchair?: Yes Type of Wheelchair: Manual    Wheelchair assist level: Dependent - Patient 0% Max wheelchair distance: 150 ft    Wheelchair 50 feet with 2 turns activity    Assist        Assist Level: Dependent - Patient 0%   Wheelchair 150 feet activity     Assist      Assist Level: Dependent - Patient 0%   Blood pressure 132/61, pulse 67, temperature 97.8 F (36.6 C), temperature source Oral, resp. rate 18, height 5' (1.524 m), weight 71.6 kg, SpO2 98%.  Medical Problem List and Plan: 1. Functional deficits secondary to cerebral palsy/ massive increase in spasticity             -patient may  shower             -ELOS/Goals: 3-4 weeks- min-mod A hopefully            - w/c evaluation while here  Con't CIR PT and OT  D/c 11/4  D/w pt after her request that back surgery would be the only way and not 100% that could help her legs get stronger due to severe spinal stenosis. Still willing to go home with power w/c  Stopped supervision with meals - 10-26: Patient endorses more familiarity with prior knob joints stick then current U-shaped one.  Defer discussion to primary team on possible wheelchair adjustment/evaluation  2.  Antithrombotics: -DVT/anticoagulation:  Pharmaceutical: Xarelto             -antiplatelet therapy: n/a   3. Pain Management: Hx of chronic pain d/t cerebral palsy-continue on home hydrocodone -acetaminophen   5-3 25 as needed   4. Mood/Behavior/Sleep: LCSW to follow for evaluation and support when available.              -antipsychotic agents: n/a   10/30- will d/w pt about her mood- and maybe adding SSRI  5. Neuropsych/cognition: This patient is capable of making decisions on her own behalf.   6. Skin/Wound Care: routine pressure relief measures.             -monitor lesion over V1 distribution on left-continue Eucerin    10/20- will con't Rewetting drops frequently for L eye- due to dryness  10/30- healed, but c/o dry L eye and swelling- will have NP assess  7. Fluids/Electrolytes/Nutrition: Monitor I&O with f/u labs in a.m.              -regular diet + Ensure, eating well   - 10-25: Electrolytes, magnesium within normal limits.  Patient asked multiple times about magnesium repletion, reassured her that it is in her normal range.   8. Muscle spacticity/CP: Resumed dantrolene 25 mg BID on 10/10. Flexeril  5 mg TID              - Monitor CBC and CMP             Will titrate dantrolene up tomorrow to TID  10/17- increased Dantrolene to 25 mg TID today- encouraged pt to take, since her spasticity is so severe.   10/18-19-- no significant change at this point. Increase dantrium to 25mg  qid. Pt is on board. Will need further titration if tolerated.   10/24 tolerating the dantrium increase to 50 TID. But I agree with her that there hasn't been dramatic change. Will increase to 50mg  qid today. We discussed other options as an outpt which could include botox and a baclofen  pump. Check LFT's Monday. 10-26: Tone not bothersome over this weekend, no gross spasms  10/27- Tone is down to 3-4 from a strong 4 prior- is looser- will wait to increase Dantrolene since only been 3 days since last increased.   10/29- increased Dantrolene to 100 mg TID- pt cannot tolerate Baclofen - caused hallucinations- could do Zanaflex , but concerned could drop BP- if Dantrolene not enough, could do low dose Botulinum toxin    10/30- Will check CMP in AM- to make sure liver is OK and Kidneys- still not drinking much 9.  Zoster/V1 distribution:  Completed 14-day course of Valtrex on 10/11 but would benefit from zoster VAX.  Outpatient follow-up with ophthalmology d/t elevated IOP on 9/27.             - Continue as needed eyedrops    10/23-24 eye drops helping, eye/skin looks much better .  9. HLD: Lipitor 10 mg    10. Hx of Chronic Diarrhea:  monitor potassium and magnesium labs, continue Imodium  2 mg daily.     - 10/25: 5x liquid BM overnight; add back immodium PRN as appears it was Dced-patient request this to be scheduled twice daily.-  No leukocytosis or fevers to indicate infectious cause.  10-26: No further bowel movements.  Reduce Imodium  to 2 mg daily  10/28- LBM 10/25- might need bowel intervention- will give her day then do Sorbitol   10/29- Small BM yesterday- needs Sorbitol -will try Miralax x1  11. Cause of spasticity?  10/17- when I got approval to bring pt to CIR, the insurance peer to peer asked that I work up more what might be causing pt's increased spasticity- I ordered CT of Chest, abd and pelvis- and if that is not fruitful, will d/w Neurology if they have any ideas- Peer to peer wanted an LP. CT not resulted yet- does have severe canal stenosis at L1/2 but wouldn't explain her spasticity, just weakness. Per chart, pt refused  surgery- will d/w pt again next Monday  10/18 CT of abdomen and pelvis really unremarkable. She has one 8mm nodule in RUL of lung with follow up recommended in ~6-12 mos  10/24 we spoke with neurology yesterday who had nothing further to offer. Recommend f/u with neuro as outpt as well as other interventions I mentioned above in #8  10/29- will increase Dantrolene to 100 mg TID- probably will need Botulinum toxin after d/c? Will d/w Dr Emeline 12. Azotemia     10/19 pt with hx of CKD III per chart.    -may not be too far from baseline   -we discussed pushing fluids  today--labs tomorrow  10/22 have reassured pt that renal function was stable.    -she's drinking plenty   10/23 renal fxn stable  10-25: Renal function improving  10/27- BUN up to 39- will replete with IVFs today after therapy and recheck in AM.   10/28- BUN down to 31- started IVFs at 8pm last night- so needs 16 hours of fluids- also pt drinking 1-2 cups/water/day- we discussed at length about drinking 6-8 cups/day- every day  10/30- still back down to not drinking much- will recheck labs in AM 12. Anemia  -drop to 10.7 today--no signs of blood loss, asymptomatic   10/24-recheck on Monday           -  B12 and Folate as well as iron panel reviewed and iron level borderline, TIBC low   -begin scheduled ferrous sulfate  10-26: Patient inquires about resumption of B12 supplementation.  Since within normal limits, defer to primary team.     I spent a total of  52  minutes on total care today- >50% coordination of care- due to  D/w pt about Spinal surgery- the only way possible to get weakness any better in legs- no guarantees- but think have 75% chance things could get better if does it.    LOS: 14 days A FACE TO FACE EVALUATION WAS PERFORMED  Sam Wunschel 08/18/2024, 10:30 AM

## 2024-08-18 NOTE — Progress Notes (Signed)
 Physical Therapy Session Note  Patient Details  Name: Kathy Owens MRN: [id number] Date of Birth: [birth year]  Today's Date: 08/18/2024 PT Individual Time: 1432-1530 PT Individual Time Calculation (min): 58 min   Short Term Goals: Week 2:  PT Short Term Goal 1 (Week 2): pt will initiate SPT training PT Short Term Goal 2 (Week 2): Pt will navigate PWC >5 min without need for cueing PT Short Term Goal 3 (Week 2): pt will perform bed mobiltiy with assist of 1  Skilled Therapeutic Interventions/Progress Updates: Pt presents sitting in PWC and agreeable to therapy.  Pt negotiated out of room and then down hallways w/ occasional CGA and cues for obstacles.  Pt tends to meander side to side.  Pt given cues to use diamonds on floor as cues to maintain straight-ish line but can be distracted and requires re-direction to task.  Pt performed reclining seat for pressure relief.  Pt returned to room and assisted into position beside the bed w/ all needs in reach.  Seat belt donned.     Therapy Documentation Precautions:  Precautions Precautions: Fall Recall of Precautions/Restrictions: Intact Precaution/Restrictions Comments: hx of CP with spasticity Restrictions Weight Bearing Restrictions Per Provider Order: No General:   Vital Signs:   Pain:0/10       Therapy/Group: Individual Therapy  Pao Haffey P Analucia Hush 08/18/2024, 3:52 PM

## 2024-08-19 LAB — COMPREHENSIVE METABOLIC PANEL WITH GFR
ALT: 17 U/L (ref 0–44)
AST: 18 U/L (ref 15–41)
Albumin: 3.1 g/dL — ABNORMAL LOW (ref 3.5–5.0)
Alkaline Phosphatase: 46 U/L (ref 38–126)
Anion gap: 13 (ref 5–15)
BUN: 24 mg/dL — ABNORMAL HIGH (ref 8–23)
CO2: 21 mmol/L — ABNORMAL LOW (ref 22–32)
Calcium: 8.9 mg/dL (ref 8.9–10.3)
Chloride: 105 mmol/L (ref 98–111)
Creatinine, Ser: 0.89 mg/dL (ref 0.44–1.00)
GFR, Estimated: >60 mL/min (ref >60)
Glucose, Bld: 101 mg/dL — ABNORMAL HIGH (ref 70–99)
Potassium: 4.2 mmol/L (ref 3.5–5.1)
Sodium: 139 mmol/L (ref 135–145)
Total Bilirubin: 0.9 mg/dL (ref 0.0–1.2)
Total Protein: 5.8 g/dL — ABNORMAL LOW (ref 6.5–8.1)

## 2024-08-19 NOTE — Plan of Care (Signed)
  Problem: Consults Goal: RH GENERAL PATIENT EDUCATION Description: See Patient Education module for education specifics. Outcome: Progressing   Problem: RH BOWEL ELIMINATION Goal: RH STG MANAGE BOWEL WITH ASSISTANCE Description: STG Manage Bowel with min- mod Assistance. Outcome: Progressing   Problem: RH BLADDER ELIMINATION Goal: RH STG MANAGE BLADDER WITH ASSISTANCE Description: STG Manage Bladder With min-mod Assistance Outcome: Progressing   Problem: RH SKIN INTEGRITY Goal: RH STG SKIN FREE OF INFECTION/BREAKDOWN Description: Manage skin free of infection with min - mod assistance Outcome: Progressing   Problem: RH SAFETY Goal: RH STG ADHERE TO SAFETY PRECAUTIONS W/ASSISTANCE/DEVICE Description: STG Adhere to Safety Precautions With min- mod Assistance/Device. Outcome: Progressing   Problem: RH PAIN MANAGEMENT Goal: RH STG PAIN MANAGED AT OR BELOW PT'S PAIN GOAL Description: < 4 w/ prns Outcome: Progressing   Problem: RH KNOWLEDGE DEFICIT GENERAL Goal: RH STG INCREASE KNOWLEDGE OF SELF CARE AFTER HOSPITALIZATION Description: Manage increase knowledge of self after hospitalization with min- mod assistance from sister using educational materials provided Outcome: Progressing   Problem: Education: Goal: Knowledge of General Education information will improve Description: Including pain rating scale, medication(s)/side effects and non-pharmacologic comfort measures Outcome: Progressing   Problem: Health Behavior/Discharge Planning: Goal: Ability to manage health-related needs will improve Outcome: Progressing   Problem: Clinical Measurements: Goal: Ability to maintain clinical measurements within normal limits will improve Outcome: Progressing Goal: Will remain free from infection Outcome: Progressing Goal: Diagnostic test results will improve Outcome: Progressing Goal: Respiratory complications will improve Outcome: Progressing Goal: Cardiovascular complication  will be avoided Outcome: Progressing   Problem: Activity: Goal: Risk for activity intolerance will decrease Outcome: Progressing   Problem: Nutrition: Goal: Adequate nutrition will be maintained Outcome: Progressing   Problem: Coping: Goal: Level of anxiety will decrease Outcome: Progressing   Problem: Elimination: Goal: Will not experience complications related to bowel motility Outcome: Progressing Goal: Will not experience complications related to urinary retention Outcome: Progressing   Problem: Pain Managment: Goal: General experience of comfort will improve and/or be controlled Outcome: Progressing   Problem: Safety: Goal: Ability to remain free from injury will improve Outcome: Progressing   Problem: Skin Integrity: Goal: Risk for impaired skin integrity will decrease Outcome: Progressing

## 2024-08-19 NOTE — Progress Notes (Signed)
 PROGRESS NOTE   Subjective/Complaints:  We decided to use lacrilube in L eye at night and prn during rest of day.    Pt reports had BM yesterday- and also needs to have one this AM.   Doesn't want SSRI- which we discussed at length  ROS: per HPI   Pt denies SOB, abd pain, CP, N/V/C/D, and vision changes  No significant change in spasticity- still severe (+)   Objective:   No results found.  Recent Labs    08/18/24 0456  WBC 6.4  HGB 10.9*  HCT 32.5*  PLT 192   Recent Labs    08/19/24 0504  NA 139  K 4.2  CL 105  CO2 21*  GLUCOSE 101*  BUN 24*  CREATININE 0.89  CALCIUM  8.9    Intake/Output Summary (Last 24 hours) at 08/19/2024 0824 Last data filed at 08/18/2024 1255 Gross per 24 hour  Intake 240 ml  Output --  Net 240 ml        Physical Exam: Vital Signs Blood pressure 122/72, pulse 77, temperature 98 F (36.7 C), temperature source Oral, resp. rate 17, height 5' (1.524 m), weight 71.6 kg, SpO2 97%.       General: awake, alert, appropriate, sitting up in bed; finished eating- 75% of tray; NAD HENT: conjugate gaze; oropharynx moist- L eye not red this AM- blinking more than expected still CV: regular rate and rhythm; no JVD Pulmonary: CTA B/L; no W/R/R- good air movement GI: soft, NT, ND, (+)BS- more normoactive Psychiatric: appropriate Neurological: Ox3  MAS of 4 again- but less than was initially- still less than when started- can actually somewhat bend LEs- at knees and ankles- harder in hips- but not smooth- could be dystonia, but so severe, hard to determine if velocity dependent.   Ext: no clubbing, cyanosis, or edema Psych: pleasant and cooperative  Skin: Left V1 rash--resolved  Neurological: Alert and oriented x 3. Normal insight and awareness. Intact Memory. Normal language, slight dysarthria. Cranial nerve exam unremarkable.     UE strength RUE- 4-4+ proximally, Grip 4-/5,  FA 3-/5 LUE- Deltoid, biceps, triceps 4/5- WE 4-/5; grip 3+/5 and FA 2/5 RLE-tone 3 prox to 3 to 4 distally LLE- tone- proximally -->dis  3+--> 3+ to 4  Physical exam unchanged from the above on reexamination 08/19/24     Assessment/Plan: 1. Functional deficits which require 3+ hours per day of interdisciplinary therapy in a comprehensive inpatient rehab setting. Physiatrist is providing close team supervision and 24 hour management of active medical problems listed below. Physiatrist and rehab team continue to assess barriers to discharge/monitor patient progress toward functional and medical goals  Care Tool:  Bathing    Body parts bathed by patient: Face, Chest, Abdomen, Right arm, Left arm, Right upper leg, Left upper leg   Body parts bathed by helper: Right lower leg, Left lower leg, Front perineal area, Buttocks     Bathing assist Assist Level: Moderate Assistance - Patient 50 - 74%     Upper Body Dressing/Undressing Upper body dressing   What is the patient wearing?: Pull over shirt    Upper body assist Assist Level: Minimal Assistance - Patient > 75%  Lower Body Dressing/Undressing Lower body dressing      What is the patient wearing?: Pants     Lower body assist Assist for lower body dressing: Total Assistance - Patient < 25%     Toileting Toileting    Toileting assist Assist for toileting: 2 Helpers     Transfers Chair/bed transfer  Transfers assist     Chair/bed transfer assist level: Minimal Assistance - Patient > 75%     Locomotion Ambulation   Ambulation assist      Assist level: Moderate Assistance - Patient 50 - 74% Assistive device: Parallel bars Max distance: 3   Walk 10 feet activity   Assist  Walk 10 feet activity did not occur: Safety/medical concerns        Walk 50 feet activity   Assist Walk 50 feet with 2 turns activity did not occur: Safety/medical concerns         Walk 150 feet activity   Assist Walk  150 feet activity did not occur: Safety/medical concerns         Walk 10 feet on uneven surface  activity   Assist Walk 10 feet on uneven surfaces activity did not occur: Safety/medical concerns         Wheelchair     Assist Is the patient using a wheelchair?: Yes Type of Wheelchair: Power    Wheelchair assist level: Contact Guard/Touching assist Max wheelchair distance: 200+    Wheelchair 50 feet with 2 turns activity    Assist        Assist Level: Contact Guard/Touching assist   Wheelchair 150 feet activity     Assist      Assist Level: Contact Guard/Touching assist   Blood pressure 122/72, pulse 77, temperature 98 F (36.7 C), temperature source Oral, resp. rate 17, height 5' (1.524 m), weight 71.6 kg, SpO2 97%.  Medical Problem List and Plan: 1. Functional deficits secondary to cerebral palsy/ massive increase in spasticity             -patient may  shower             -ELOS/Goals: 3-4 weeks- min-mod A hopefully            - w/c evaluation while here  Con't CIR PT and OT  D/c 11/4  D/w pt after her request that back surgery would be the only way and not 100% that could help her legs get stronger due to severe spinal stenosis. Still willing to go home with power w/c  Stopped supervision with meals - 10-26: Patient endorses more familiarity with prior knob joints stick then current U-shaped one.  Defer discussion to primary team on possible wheelchair adjustment/evaluation  2.  Antithrombotics: -DVT/anticoagulation:  Pharmaceutical: Xarelto             -antiplatelet therapy: n/a   3. Pain Management: Hx of chronic pain d/t cerebral palsy-continue on home hydrocodone -acetaminophen  5-3 25 as needed   4. Mood/Behavior/Sleep: LCSW to follow for evaluation and support when available.              -antipsychotic agents: n/a   10/30- will d/w pt about her mood- and maybe adding SSRI  5. Neuropsych/cognition: This patient is capable of making  decisions on her own behalf.   6. Skin/Wound Care: routine pressure relief measures.             -monitor lesion over V1 distribution on left-continue Eucerin    10/20- will con't Rewetting drops frequently for  L eye- due to dryness  10/30- healed, but c/o dry L eye and swelling- will have NP assess   10/31- added lacrilube for L eye- gritty feeling- and dryness- eye not red this AM 7. Fluids/Electrolytes/Nutrition: Monitor I&O with f/u labs in a.m.              -regular diet + Ensure, eating well   - 10-25: Electrolytes, magnesium within normal limits.  Patient asked multiple times about magnesium repletion, reassured her that it is in her normal range.   8. Muscle spacticity/CP: Resumed dantrolene 25 mg BID on 10/10. Flexeril  5 mg TID              - Monitor CBC and CMP             Will titrate dantrolene up tomorrow to TID  10/17- increased Dantrolene to 25 mg TID today- encouraged pt to take, since her spasticity is so severe.   10/18-19-- no significant change at this point. Increase dantrium to 25mg  qid. Pt is on board. Will need further titration if tolerated.   10/24 tolerating the dantrium increase to 50 TID. But I agree with her that there hasn't been dramatic change. Will increase to 50mg  qid today. We discussed other options as an outpt which could include botox and a baclofen  pump. Check LFT's Monday. 10-26: Tone not bothersome over this weekend, no gross spasms  10/27- Tone is down to 3-4 from a strong 4 prior- is looser- will wait to increase Dantrolene since only been 3 days since last increased.   10/29- increased Dantrolene to 100 mg TID- pt cannot tolerate Baclofen - caused hallucinations- could do Zanaflex , but concerned could drop BP- if Dantrolene not enough, could do low dose Botulinum toxin   10/30- Will check CMP in AM- to make sure liver is OK and Kidneys- still not drinking much  10/31- LFTs look great- 18 and 17- con't dantrolene 9.  Zoster/V1 distribution:   Completed 14-day course of Valtrex on 10/11 but would benefit from zoster VAX.  Outpatient follow-up with ophthalmology d/t elevated IOP on 9/27.             - Continue as needed eyedrops    10/23-24 eye drops helping, eye/skin looks much better .  9. HLD: Lipitor 10 mg    10. Hx of Chronic Diarrhea:  monitor potassium and magnesium labs, continue Imodium  2 mg daily.     - 10/25: 5x liquid BM overnight; add back immodium PRN as appears it was Dced-patient request this to be scheduled twice daily.-  No leukocytosis or fevers to indicate infectious cause.  10-26: No further bowel movements.  Reduce Imodium  to 2 mg daily  10/28- LBM 10/25- might need bowel intervention- will give her day then do Sorbitol   10/29- Small BM yesterday- needs Sorbitol -will try Miralax x1  10/31- tried sorbitol yesterday- had BM and needs to have one this AM  11. Cause of spasticity?  10/17- when I got approval to bring pt to CIR, the insurance peer to peer asked that I work up more what might be causing pt's increased spasticity- I ordered CT of Chest, abd and pelvis- and if that is not fruitful, will d/w Neurology if they have any ideas- Peer to peer wanted an LP. CT not resulted yet- does have severe canal stenosis at L1/2 but wouldn't explain her spasticity, just weakness. Per chart, pt refused surgery- will d/w pt again next Monday  10/18 CT of abdomen  and pelvis really unremarkable. She has one 8mm nodule in RUL of lung with follow up recommended in ~6-12 mos  10/24 we spoke with neurology yesterday who had nothing further to offer. Recommend f/u with neuro as outpt as well as other interventions I mentioned above in #8  10/29- will increase Dantrolene to 100 mg TID- probably will need Botulinum toxin after d/c? Will d/w Dr Emeline 12. Azotemia     10/19 pt with hx of CKD III per chart.    -may not be too far from baseline   -we discussed pushing fluids today--labs tomorrow  10/22 have reassured pt that  renal function was stable.    -she's drinking plenty   10/23 renal fxn stable  10-25: Renal function improving  10/27- BUN up to 39- will replete with IVFs today after therapy and recheck in AM.   10/28- BUN down to 31- started IVFs at 8pm last night- so needs 16 hours of fluids- also pt drinking 1-2 cups/water/day- we discussed at length about drinking 6-8 cups/day- every day  10/30- still back down to not drinking much- will recheck labs in AM  10/31- drinking better- BUN down to 24- happy with current regimen 12. Anemia  -drop to 10.7 today--no signs of blood loss, asymptomatic   10/24-recheck on Monday           -  B12 and Folate as well as iron panel reviewed and iron level borderline, TIBC low   -begin scheduled ferrous sulfate  10-26: Patient inquires about resumption of B12 supplementation.  Since within normal limits, defer to primary team.      I spent a total of 36   minutes on total care today- >50% coordination of care- due to  D/w pt about: back surgery, SSRI; fluid intake; and bowels- also touched base with nursing- reviewed labs, vitals and B/B   LOS: 15 days A FACE TO FACE EVALUATION WAS PERFORMED  Jefrey Raburn 08/19/2024, 8:24 AM

## 2024-08-19 NOTE — Progress Notes (Signed)
 Physical Therapy Session Note  Patient Details  Name: Kathy Owens MRN: [id number] Date of Birth: May 24, 1956  Today's Date: 08/19/2024 PT Individual Time: 1430-1455 PT Individual Time Calculation (min): 25 min   Short Term Goals: Week 2:  PT Short Term Goal 1 (Week 2): pt will initiate SPT training PT Short Term Goal 2 (Week 2): Pt will navigate PWC >5 min without need for cueing PT Short Term Goal 3 (Week 2): pt will perform bed mobiltiy with assist of 1  Skilled Therapeutic Interventions/Progress Updates:    Pt received in PWC, agreeable to therapy. No complaint of pain. Pt navigated PWC with max cueing for functions/changing positions and min-mod cueing navigating hallway. Provided demo of how to turn chair 360 deg for easier positioning in tight spaces.   Pt also participated in Sit to stand with +2 assist into standing and instructed in standing marches. Pt able to clear each foot with difficulty and increased time. Pt then assisted back into sitting and used chair features to reposition in chair with max cueing. Pt returned to room, required assist for turn in room. Pt remained in chair with needs in reach.  Therapy Documentation Precautions:  Precautions Precautions: Fall Recall of Precautions/Restrictions: Intact Precaution/Restrictions Comments: hx of CP with spasticity Restrictions Weight Bearing Restrictions Per Provider Order: No General:       Therapy/Group: Individual Therapy  [name] 08/19/2024, 5:09 PM

## 2024-08-19 NOTE — Progress Notes (Signed)
 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 Focus on navigating the power chair out of the hospital through parking lots and uneven terrian and managing features of the chair. Pt still requires max A to manage features of the power chair and to change between positioning and driving. Pt still requires min to mod cues for driving and navigating around obstacles and ensuring that she is not running into things on the right.  Pt also performed functional reach with handing out treats. Returned to room and left sitting up in w/c in prep for lunch.  Pt reports her sister is not able to come for education due to being sick. Discussed her sister being well to be able to care for her. Also would recommend sister coming in to learn how to manage the power chair and see bed mobility and a transfer.   Therapy Documentation Precautions:  Precautions Precautions: Fall Recall of Precautions/Restrictions: Intact Precaution/Restrictions Comments: hx of CP with spasticity Restrictions Weight Bearing Restrictions Per Provider Order: No  Pain:  No c/o pain   Therapy/Group: Individual Therapy  Claudene [facility] 08/19/2024, 12:46 PM

## 2024-08-19 NOTE — Progress Notes (Signed)
 Occupational Therapy Session Note  Patient Details  Name: Kathy Owens MRN: [id number] Date of Birth: [birth year]  Today's Date: 08/19/2024 OT Individual Time: 9184-9084 OT Individual Time Calculation (min): 60 min    Short Term Goals: Week 2:  OT Short Term Goal 1 (Week 2): PT will don shirt with mod A in unsupported sitting OT Short Term Goal 2 (Week 2): Pt don LB clothing with mod A OT Short Term Goal 3 (Week 2): Pt will performed supine to sitting EOB with mod A  Skilled Therapeutic Interventions/Progress Updates:    Pt resting in bed upon arrival. Pt incontinent of bowel and bladder. Total A for hygiene and donning clean clothing. Pt initiates rolling R/L in bed but requires max A for rolling and +2 to maintain sidelying to facilitate hygiene. Total A for donning pants at bed level. Max A for supine>sit EOB with increased time to facilitate decreased extensor tone. Pt able to get BLE onto floor in preparation for sit<>stand with min A. Transfer to [facility] this morning with Stedy (time mgmt). Pt able to scoot back into PWC. Pt required min A for donning shirt seated in PWC. Pt remained in PWC with RN present.   Therapy Documentation Precautions:  Precautions Precautions: Fall Recall of Precautions/Restrictions: Intact Precaution/Restrictions Comments: hx of CP with spasticity Restrictions Weight Bearing Restrictions Per Provider Order: No   Pain:  Pt denies pain this morning   Therapy/Group: Individual Therapy  [name] 08/19/2024, 12:18 PM

## 2024-08-19 NOTE — Progress Notes (Addendum)
 Patient ID: Kathy Owens, female   DOB: 1956/10/17, 68 y.o.   MRN: [id number]   [phone]- SW called pt sister to follow-up to see if she were better and would be coming in for family edu. States she is still working on getting better. SW shared pt will continue to require a lot of physical assistance. She reports that it has always been the two of them and she is used to helping her. SW shared on [facility] referral that will be made rior to d/c and CAP/DA referral made.   SW waiting on final DME recs.   SW sent out [facility] referral for HHPT/OT/aide to various [facility] and waiting on follow-up.  *referral accepted by [facility].   Denied HHAs [facility]   Graeme Jude, MSW, [place] Office: [phone] Cell: [phone] Fax: [phone]

## 2024-08-19 NOTE — Progress Notes (Addendum)
 Patient ID: Kathy Owens, female   DOB: 1956-02-09, 68 y.o.   MRN: [id number]   SW met with pt to follow-up about decision about discharge plan. Pt plans to go home vs SNF.   Graeme Jude, MSW, LCSW Office: [phone] Cell: [phone] Fax: [phone]

## 2024-08-19 NOTE — Progress Notes (Signed)
 Occupational Therapy Weekly Progress Note  Patient Details  Name: Kathy Owens MRN: [id number] Date of Birth: 05-27-1956  Beginning of progress report period: August 12, 2024 End of progress report period: August 19, 2024  Patient has met 1 of 3 short term goals.  Pt progress has been limited over the past week 2/2 ongoing extensor tone. Pt able to roll R/L in bed with max A using bed rails and increased time to complete task. Supine>sit EOB with max A. Sitting balance EOB with CGA with increased time to facilitate relaxing of extensor tone. Sit<>stand with min A. Functional transfers with min A using RW and max verbal cues for sequencing. Pt requires increased time to complete transfers. LB dressing and toileting with tot A. [name] has been present for therapy. Recommend sister be present for therapy. SNF recommended and pt insistent on returning home.   Patient continues to demonstrate the following deficits: muscle weakness and muscle joint tightness, decreased cardiorespiratoy endurance, abnormal tone and unbalanced muscle activation, and decreased sitting balance, decreased standing balance, decreased postural control, and decreased balance strategies and therefore will continue to benefit from skilled OT intervention to enhance overall performance with BADL and Reduce care partner burden.  Patient not progressing toward long term goals.  See goal revision..  Plan of care revisions: revised goals d/t lack of progress.  OT Short Term Goals Week 2:  OT Short Term Goal 1 (Week 2): PT will don shirt with mod A in unsupported sitting OT Short Term Goal 1 - Progress (Week 2): Met OT Short Term Goal 2 (Week 2): Pt don LB clothing with mod A OT Short Term Goal 2 - Progress (Week 2): Discontinued (comment) OT Short Term Goal 3 (Week 2): Pt will performed supine to sitting EOB with mod A OT Short Term Goal 3 - Progress (Week 2): Discontinued (comment) Week 3:  OT Short Term Goal 1 (Week 3):  STG=LTG 2/2 ELOS (LTG downgraded 2/2 lack of progress)  [name], OTD, OTR/L [name] 08/19/2024, 3:17 PM

## 2024-08-20 DIAGNOSIS — G894 Chronic pain syndrome: Secondary | ICD-10-CM

## 2024-08-20 DIAGNOSIS — D649 Anemia, unspecified: Secondary | ICD-10-CM

## 2024-08-20 DIAGNOSIS — K529 Noninfective gastroenteritis and colitis, unspecified: Secondary | ICD-10-CM

## 2024-08-20 NOTE — Plan of Care (Signed)
  Problem: Consults Goal: RH GENERAL PATIENT EDUCATION Description: See Patient Education module for education specifics. Outcome: Progressing   Problem: RH BOWEL ELIMINATION Goal: RH STG MANAGE BOWEL WITH ASSISTANCE Description: STG Manage Bowel with min- mod Assistance. Outcome: Progressing   Problem: RH BLADDER ELIMINATION Goal: RH STG MANAGE BLADDER WITH ASSISTANCE Description: STG Manage Bladder With min-mod Assistance Outcome: Progressing   Problem: RH SKIN INTEGRITY Goal: RH STG SKIN FREE OF INFECTION/BREAKDOWN Description: Manage skin free of infection with min - mod assistance Outcome: Progressing   Problem: RH SAFETY Goal: RH STG ADHERE TO SAFETY PRECAUTIONS W/ASSISTANCE/DEVICE Description: STG Adhere to Safety Precautions With min- mod Assistance/Device. Outcome: Progressing   Problem: RH PAIN MANAGEMENT Goal: RH STG PAIN MANAGED AT OR BELOW PT'S PAIN GOAL Description: < 4 w/ prns Outcome: Progressing   Problem: RH KNOWLEDGE DEFICIT GENERAL Goal: RH STG INCREASE KNOWLEDGE OF SELF CARE AFTER HOSPITALIZATION Description: Manage increase knowledge of self after hospitalization with min- mod assistance from sister using educational materials provided Outcome: Progressing   Problem: Education: Goal: Knowledge of General Education information will improve Description: Including pain rating scale, medication(s)/side effects and non-pharmacologic comfort measures Outcome: Progressing   Problem: Health Behavior/Discharge Planning: Goal: Ability to manage health-related needs will improve Outcome: Progressing   Problem: Clinical Measurements: Goal: Ability to maintain clinical measurements within normal limits will improve Outcome: Progressing Goal: Will remain free from infection Outcome: Progressing Goal: Diagnostic test results will improve Outcome: Progressing Goal: Respiratory complications will improve Outcome: Progressing Goal: Cardiovascular complication  will be avoided Outcome: Progressing   Problem: Activity: Goal: Risk for activity intolerance will decrease Outcome: Progressing   Problem: Nutrition: Goal: Adequate nutrition will be maintained Outcome: Progressing   Problem: Coping: Goal: Level of anxiety will decrease Outcome: Progressing   Problem: Elimination: Goal: Will not experience complications related to bowel motility Outcome: Progressing Goal: Will not experience complications related to urinary retention Outcome: Progressing   Problem: Pain Managment: Goal: General experience of comfort will improve and/or be controlled Outcome: Progressing   Problem: Safety: Goal: Ability to remain free from injury will improve Outcome: Progressing   Problem: Skin Integrity: Goal: Risk for impaired skin integrity will decrease Outcome: Progressing

## 2024-08-20 NOTE — Progress Notes (Signed)
 PROGRESS NOTE   Subjective/Complaints:  Patient has several chronic hydrocodone  prescription with her outpatient provider Mliss Pouch.  Does not appear to be using her Norco very frequently at this time.  She is working on drinking more oral fluids. LBM this morning large.  ROS: per HPI   Pt denies fever, chills, SOB, abd pain, CP, N/V/C/D, and vision changes  No significant change in spasticity- still severe (+)   Objective:   No results found.  Recent Labs    08/18/24 0456  WBC 6.4  HGB 10.9*  HCT 32.5*  PLT 192   Recent Labs    08/19/24 0504  NA 139  K 4.2  CL 105  CO2 21*  GLUCOSE 101*  BUN 24*  CREATININE 0.89  CALCIUM  8.9    Intake/Output Summary (Last 24 hours) at 08/20/2024 1434 Last data filed at 08/20/2024 0840 Gross per 24 hour  Intake 598 ml  Output --  Net 598 ml        Physical Exam: Vital Signs Blood pressure 130/79, pulse 86, temperature 98 F (36.7 C), resp. rate 18, height 5' (1.524 m), weight 71.6 kg, SpO2 99%.       General: awake, alert, appropriate, sitting up in bed; drinking water, NAD HENT: conjugate gaze; oropharynx moist- L eye not red this AM CV: regular rate and rhythm; no JVD Pulmonary: CTA B/L; nonlabored breathing GI: soft, NT, ND, (+)BS- more normoactive Psychiatric: appropriate Neurological: Ox3  MAS of 4 again- but less than was initially- still less than when started- can actually somewhat bend LEs- at knees and ankles- harder in hips- but not smooth- could be dystonia, but so severe, hard to determine if velocity dependent.   Ext: no clubbing, cyanosis, or edema Psych: pleasant and cooperative  Skin: Left V1 rash--resolved  Neurological: Alert and oriented x 3. Normal insight and awareness. Intact Memory. Normal language, slight dysarthria. Cranial nerve exam unremarkable.     UE strength RUE- 4-4+ proximally, Grip 4-/5, FA 3-/5 LUE- Deltoid,  biceps, triceps 4/5- WE 4-/5; grip 3+/5 and FA 2/5 RLE-tone 3 prox to 3 to 4 distally LLE- tone- proximally -->dis  3+--> 3+ to 4  Physical exam unchanged from the above on reexamination 08/20/24     Assessment/Plan: 1. Functional deficits which require 3+ hours per day of interdisciplinary therapy in a comprehensive inpatient rehab setting. Physiatrist is providing close team supervision and 24 hour management of active medical problems listed below. Physiatrist and rehab team continue to assess barriers to discharge/monitor patient progress toward functional and medical goals  Care Tool:  Bathing    Body parts bathed by patient: Face, Chest, Abdomen, Right arm, Left arm, Right upper leg, Left upper leg   Body parts bathed by helper: Right lower leg, Left lower leg, Front perineal area, Buttocks     Bathing assist Assist Level: Moderate Assistance - Patient 50 - 74%     Upper Body Dressing/Undressing Upper body dressing   What is the patient wearing?: Pull over shirt    Upper body assist Assist Level: Minimal Assistance - Patient > 75%    Lower Body Dressing/Undressing Lower body dressing      What  is the patient wearing?: Pants, Incontinence brief     Lower body assist Assist for lower body dressing: Total Assistance - Patient < 25%     Toileting Toileting    Toileting assist Assist for toileting: 2 Helpers     Transfers Chair/bed transfer  Transfers assist     Chair/bed transfer assist level: Minimal Assistance - Patient > 75%     Locomotion Ambulation   Ambulation assist      Assist level: Moderate Assistance - Patient 50 - 74% Assistive device: Parallel bars Max distance: 3   Walk 10 feet activity   Assist  Walk 10 feet activity did not occur: Safety/medical concerns        Walk 50 feet activity   Assist Walk 50 feet with 2 turns activity did not occur: Safety/medical concerns         Walk 150 feet activity   Assist Walk 150  feet activity did not occur: Safety/medical concerns         Walk 10 feet on uneven surface  activity   Assist Walk 10 feet on uneven surfaces activity did not occur: Safety/medical concerns         Wheelchair     Assist Is the patient using a wheelchair?: Yes Type of Wheelchair: Power    Wheelchair assist level: Contact Guard/Touching assist Max wheelchair distance: 200+    Wheelchair 50 feet with 2 turns activity    Assist        Assist Level: Contact Guard/Touching assist   Wheelchair 150 feet activity     Assist      Assist Level: Contact Guard/Touching assist   Blood pressure 130/79, pulse 86, temperature 98 F (36.7 C), resp. rate 18, height 5' (1.524 m), weight 71.6 kg, SpO2 99%.  Medical Problem List and Plan: 1. Functional deficits secondary to cerebral palsy/ massive increase in spasticity             -patient may  shower             -ELOS/Goals: 3-4 weeks- min-mod A hopefully            - w/c evaluation while here  Con't CIR PT and OT  D/c 11/4  D/w pt after her request that back surgery would be the only way and not 100% that could help her legs get stronger due to severe spinal stenosis. Still willing to go home with power w/c  Stopped supervision with meals - 10-26: Patient endorses more familiarity with prior knob joints stick then current U-shaped one.  Defer discussion to primary team on possible wheelchair adjustment/evaluation  2.  Antithrombotics: -DVT/anticoagulation:  Pharmaceutical: Xarelto             -antiplatelet therapy: n/a   3. Pain Management: Hx of chronic pain d/t cerebral palsy-continue on home hydrocodone -acetaminophen  5-3 25 as needed  - 11/1 patient on Norco 5 from outpatient provider, patient says that she called her outpatient provider, per patient report it sounds like outpatient provider plans to wait to refill until closer to discharge in case dose was can be changed.  Asked if she is on a pain agreement  she and patient reported she did have this.  I recommended she contact the office early next week for discharge. 4. Mood/Behavior/Sleep: LCSW to follow for evaluation and support when available.              -antipsychotic agents: n/a   10/30- will d/w pt about her mood- and  maybe adding SSRI  5. Neuropsych/cognition: This patient is capable of making decisions on her own behalf.   6. Skin/Wound Care: routine pressure relief measures.             -monitor lesion over V1 distribution on left-continue Eucerin    10/20- will con't Rewetting drops frequently for L eye- due to dryness  10/30- healed, but c/o dry L eye and swelling- will have NP assess   10/31- added lacrilube for L eye- gritty feeling- and dryness- eye not red this AM 7. Fluids/Electrolytes/Nutrition: Monitor I&O with f/u labs in a.m.              -regular diet + Ensure, eating well   - 10-25: Electrolytes, magnesium within normal limits.  Patient asked multiple times about magnesium repletion, reassured her that it is in her normal range.   8. Muscle spacticity/CP: Resumed dantrolene 25 mg BID on 10/10. Flexeril  5 mg TID              - Monitor CBC and CMP             Will titrate dantrolene up tomorrow to TID  10/17- increased Dantrolene to 25 mg TID today- encouraged pt to take, since her spasticity is so severe.   10/18-19-- no significant change at this point. Increase dantrium to 25mg  qid. Pt is on board. Will need further titration if tolerated.   10/24 tolerating the dantrium increase to 50 TID. But I agree with her that there hasn't been dramatic change. Will increase to 50mg  qid today. We discussed other options as an outpt which could include botox and a baclofen  pump. Check LFT's Monday. 10-26: Tone not bothersome over this weekend, no gross spasms  10/27- Tone is down to 3-4 from a strong 4 prior- is looser- will wait to increase Dantrolene since only been 3 days since last increased.   10/29- increased Dantrolene to  100 mg TID- pt cannot tolerate Baclofen - caused hallucinations- could do Zanaflex , but concerned could drop BP- if Dantrolene not enough, could do low dose Botulinum toxin   10/30- Will check CMP in AM- to make sure liver is OK and Kidneys- still not drinking much  10/31- LFTs look great- 18 and 17- con't dantrolene 9.  Zoster/V1 distribution:  Completed 14-day course of Valtrex on 10/11 but would benefit from zoster VAX.  Outpatient follow-up with ophthalmology d/t elevated IOP on 9/27.             - Continue as needed eyedrops    10/23-24 eye drops helping, eye/skin looks much better .  9. HLD: Lipitor 10 mg    10. Hx of Chronic Diarrhea:  monitor potassium and magnesium labs, continue Imodium  2 mg daily.     - 10/25: 5x liquid BM overnight; add back immodium PRN as appears it was Dced-patient request this to be scheduled twice daily.-  No leukocytosis or fevers to indicate infectious cause.  10-26: No further bowel movements.  Reduce Imodium  to 2 mg daily  10/28- LBM 10/25- might need bowel intervention- will give her day then do Sorbitol   10/29- Small BM yesterday- needs Sorbitol -will try Miralax x1  10/31- tried sorbitol yesterday- had BM and needs to have one this AM  11/1 had large type IV bowel movement this morning, continue to monitor  11. Cause of spasticity?  10/17- when I got approval to bring pt to CIR, the insurance peer to peer asked that I work up more  what might be causing pt's increased spasticity- I ordered CT of Chest, abd and pelvis- and if that is not fruitful, will d/w Neurology if they have any ideas- Peer to peer wanted an LP. CT not resulted yet- does have severe canal stenosis at L1/2 but wouldn't explain her spasticity, just weakness. Per chart, pt refused surgery- will d/w pt again next Monday  10/18 CT of abdomen and pelvis really unremarkable. She has one 8mm nodule in RUL of lung with follow up recommended in ~6-12 mos  10/24 we spoke with neurology  yesterday who had nothing further to offer. Recommend f/u with neuro as outpt as well as other interventions I mentioned above in #8  10/29- will increase Dantrolene to 100 mg TID- probably will need Botulinum toxin after d/c? Will d/w Dr Emeline 12. Azotemia     10/19 pt with hx of CKD III per chart.    -may not be too far from baseline   -we discussed pushing fluids today--labs tomorrow  10/22 have reassured pt that renal function was stable.    -she's drinking plenty   10/23 renal fxn stable  10-25: Renal function improving  10/27- BUN up to 39- will replete with IVFs today after therapy and recheck in AM.   10/28- BUN down to 31- started IVFs at 8pm last night- so needs 16 hours of fluids- also pt drinking 1-2 cups/water/day- we discussed at length about drinking 6-8 cups/day- every day  10/30- still back down to not drinking much- will recheck labs in AM  10/31- drinking better- BUN down to 24- happy with current regimen  11/1 reviewed CMP from yesterday, BUN down to 24 reiterated, patient has been working on drinking more water.  She asked me to open her water bottle for her.  Continue to encourage. 12. Anemia  -drop to 10.7 today--no signs of blood loss, asymptomatic   10/24-recheck on Monday           -  B12 and Folate as well as iron panel reviewed and iron level borderline, TIBC low   -begin scheduled ferrous sulfate  10-26: Patient inquires about resumption of B12 supplementation.  Since within normal limits, defer to primary team.  -HGB stable 10.9 on 10/30      LOS: 16 days A FACE TO FACE EVALUATION WAS PERFORMED  Murray Collier 08/20/2024, 2:34 PM

## 2024-08-21 NOTE — Plan of Care (Signed)
  Problem: RH BOWEL ELIMINATION Goal: RH STG MANAGE BOWEL WITH ASSISTANCE Description: STG Manage Bowel with min- mod Assistance. Outcome: Progressing   Problem: RH BLADDER ELIMINATION Goal: RH STG MANAGE BLADDER WITH ASSISTANCE Description: STG Manage Bladder With min-mod Assistance Outcome: Progressing   Problem: RH SKIN INTEGRITY Goal: RH STG SKIN FREE OF INFECTION/BREAKDOWN Description: Manage skin free of infection with min - mod assistance Outcome: Progressing   Problem: RH SAFETY Goal: RH STG ADHERE TO SAFETY PRECAUTIONS W/ASSISTANCE/DEVICE Description: STG Adhere to Safety Precautions With min- mod Assistance/Device. Outcome: Progressing

## 2024-08-21 NOTE — Progress Notes (Signed)
 PROGRESS NOTE   Subjective/Complaints:  L eye discomfort gradually improving, she feels like its less swollen than it was  several days ago.  Reports pain is under control.  No additional concerns.  Reports she is drinking water.  LBM today     ROS: per HPI   Pt denies chills, new rash, SOB, abd pain, CP, N/V/C/D, and vision changes  No significant change in spasticity- still severe (+)   Objective:   No results found.  No results for input(s): WBC, HGB, HCT, PLT in the last 72 hours.  Recent Labs    08/19/24 0504  NA 139  K 4.2  CL 105  CO2 21*  GLUCOSE 101*  BUN 24*  CREATININE 0.89  CALCIUM  8.9    Intake/Output Summary (Last 24 hours) at 08/21/2024 1554 Last data filed at 08/21/2024 0800 Gross per 24 hour  Intake 780 ml  Output --  Net 780 ml        Physical Exam: Vital Signs Blood pressure (!) 133/119, pulse 79, temperature 97.7 F (36.5 C), temperature source Oral, resp. rate 18, height 5' (1.524 m), weight 71.6 kg, SpO2 96%.       General: awake, alert, appropriate, sitting up in bed; NAD, appears comfortable HENT: conjugate gaze; oropharynx moist- L eye not red this AM CV: regular rate and rhythm; no JVD Pulmonary: CTA B/L; nonlabored breathing GI: soft, NT, ND, (+)BS- more normoactive Psychiatric: appropriate Neurological: Ox3  MAS of 4 again- but less than was initially- still less than when started- can actually somewhat bend LEs- at knees and ankles- harder in hips- but not smooth- could be dystonia, but so severe, hard to determine if velocity dependent.   Ext: no clubbing, cyanosis, or edema Psych: pleasant and cooperative  Skin: Left V1 rash--resolved  Neurological: Alert and oriented x 3. Normal insight and awareness. Intact Memory. Normal language, slight dysarthria. Cranial nerve exam unremarkable.     UE strength RUE- 4-4+ proximally, Grip 4-/5, FA 3-/5 LUE-  Deltoid, biceps, triceps 4/5- WE 4-/5; grip 3+/5 and FA 2/5 RLE-tone 3 prox to 3 to 4 distally LLE- tone- proximally -->dis  3+--> 3+ to 4  Physical exam unchanged from the above on reexamination 08/21/24     Assessment/Plan: 1. Functional deficits which require 3+ hours per day of interdisciplinary therapy in a comprehensive inpatient rehab setting. Physiatrist is providing close team supervision and 24 hour management of active medical problems listed below. Physiatrist and rehab team continue to assess barriers to discharge/monitor patient progress toward functional and medical goals  Care Tool:  Bathing    Body parts bathed by patient: Face, Chest, Abdomen, Right arm, Left arm, Right upper leg, Left upper leg   Body parts bathed by helper: Right lower leg, Left lower leg, Front perineal area, Buttocks     Bathing assist Assist Level: Moderate Assistance - Patient 50 - 74%     Upper Body Dressing/Undressing Upper body dressing   What is the patient wearing?: Pull over shirt    Upper body assist Assist Level: Minimal Assistance - Patient > 75%    Lower Body Dressing/Undressing Lower body dressing      What is  the patient wearing?: Pants, Incontinence brief     Lower body assist Assist for lower body dressing: Total Assistance - Patient < 25%     Toileting Toileting    Toileting assist Assist for toileting: 2 Helpers     Transfers Chair/bed transfer  Transfers assist     Chair/bed transfer assist level: Minimal Assistance - Patient > 75%     Locomotion Ambulation   Ambulation assist      Assist level: Moderate Assistance - Patient 50 - 74% Assistive device: Parallel bars Max distance: 3   Walk 10 feet activity   Assist  Walk 10 feet activity did not occur: Safety/medical concerns        Walk 50 feet activity   Assist Walk 50 feet with 2 turns activity did not occur: Safety/medical concerns         Walk 150 feet activity   Assist  Walk 150 feet activity did not occur: Safety/medical concerns         Walk 10 feet on uneven surface  activity   Assist Walk 10 feet on uneven surfaces activity did not occur: Safety/medical concerns         Wheelchair     Assist Is the patient using a wheelchair?: Yes Type of Wheelchair: Power    Wheelchair assist level: Contact Guard/Touching assist Max wheelchair distance: 200+    Wheelchair 50 feet with 2 turns activity    Assist        Assist Level: Contact Guard/Touching assist   Wheelchair 150 feet activity     Assist      Assist Level: Contact Guard/Touching assist   Blood pressure (!) 133/119, pulse 79, temperature 97.7 F (36.5 C), temperature source Oral, resp. rate 18, height 5' (1.524 m), weight 71.6 kg, SpO2 96%.  Medical Problem List and Plan: 1. Functional deficits secondary to cerebral palsy/ massive increase in spasticity             -patient may  shower             -ELOS/Goals: 3-4 weeks- min-mod A hopefully            - w/c evaluation while here  Con't CIR PT and OT  D/c 11/4  D/w pt after her request that back surgery would be the only way and not 100% that could help her legs get stronger due to severe spinal stenosis. Still willing to go home with power w/c  Stopped supervision with meals - 10-26: Patient endorses more familiarity with prior knob joints stick then current U-shaped one.  Defer discussion to primary team on possible wheelchair adjustment/evaluation  2.  Antithrombotics: -DVT/anticoagulation:  Pharmaceutical: Xarelto             -antiplatelet therapy: n/a   3. Pain Management: Hx of chronic pain d/t cerebral palsy-continue on home hydrocodone -acetaminophen  5-3 25 as needed  - 11/1 patient on Norco 5 from outpatient provider, patient says that she called her outpatient provider, per patient report it sounds like outpatient provider plans to wait to refill until closer to discharge in case dose was can be  changed.  Asked if she is on a pain agreement she and patient reported she did have this.  I recommended she contact the office early next week for discharge.  -11/2 reports pain overall under control 4. Mood/Behavior/Sleep: LCSW to follow for evaluation and support when available.              -antipsychotic agents: n/a  10/30- will d/w pt about her mood- and maybe adding SSRI  5. Neuropsych/cognition: This patient is capable of making decisions on her own behalf.   6. Skin/Wound Care: routine pressure relief measures.             -monitor lesion over V1 distribution on left-continue Eucerin    10/20- will con't Rewetting drops frequently for L eye- due to dryness  10/30- healed, but c/o dry L eye and swelling- will have NP assess   10/31- added lacrilube for L eye- gritty feeling- and dryness- eye not red this AM 7. Fluids/Electrolytes/Nutrition: Monitor I&O with f/u labs in a.m.              -regular diet + Ensure, eating well   - 10-25: Electrolytes, magnesium within normal limits.  Patient asked multiple times about magnesium repletion, reassured her that it is in her normal range.   8. Muscle spacticity/CP: Resumed dantrolene 25 mg BID on 10/10. Flexeril  5 mg TID              - Monitor CBC and CMP             Will titrate dantrolene up tomorrow to TID  10/17- increased Dantrolene to 25 mg TID today- encouraged pt to take, since her spasticity is so severe.   10/18-19-- no significant change at this point. Increase dantrium to 25mg  qid. Pt is on board. Will need further titration if tolerated.   10/24 tolerating the dantrium increase to 50 TID. But I agree with her that there hasn't been dramatic change. Will increase to 50mg  qid today. We discussed other options as an outpt which could include botox and a baclofen  pump. Check LFT's Monday. 10-26: Tone not bothersome over this weekend, no gross spasms  10/27- Tone is down to 3-4 from a strong 4 prior- is looser- will wait to increase  Dantrolene since only been 3 days since last increased.   10/29- increased Dantrolene to 100 mg TID- pt cannot tolerate Baclofen - caused hallucinations- could do Zanaflex , but concerned could drop BP- if Dantrolene not enough, could do low dose Botulinum toxin   10/30- Will check CMP in AM- to make sure liver is OK and Kidneys- still not drinking much  10/31- LFTs look great- 18 and 17- con't dantrolene 9.  Zoster/V1 distribution:  Completed 14-day course of Valtrex on 10/11 but would benefit from zoster VAX.  Outpatient follow-up with ophthalmology d/t elevated IOP on 9/27.             - Continue as needed eyedrops    10/23-24 eye drops helping, eye/skin looks much better .  9. HLD: Lipitor 10 mg    10. Hx of Chronic Diarrhea:  monitor potassium and magnesium labs, continue Imodium  2 mg daily.     - 10/25: 5x liquid BM overnight; add back immodium PRN as appears it was Dced-patient request this to be scheduled twice daily.-  No leukocytosis or fevers to indicate infectious cause.  10-26: No further bowel movements.  Reduce Imodium  to 2 mg daily  10/28- LBM 10/25- might need bowel intervention- will give her day then do Sorbitol   10/29- Small BM yesterday- needs Sorbitol -will try Miralax x1  10/31- tried sorbitol yesterday- had BM and needs to have one this AM  11/1 had large type IV bowel movement this morning, continue to monitor  11/2 BM today, continue to follow  11. Cause of spasticity?  10/17- when I got approval to  bring pt to CIR, the insurance peer to peer asked that I work up more what might be causing pt's increased spasticity- I ordered CT of Chest, abd and pelvis- and if that is not fruitful, will d/w Neurology if they have any ideas- Peer to peer wanted an LP. CT not resulted yet- does have severe canal stenosis at L1/2 but wouldn't explain her spasticity, just weakness. Per chart, pt refused surgery- will d/w pt again next Monday  10/18 CT of abdomen and pelvis really  unremarkable. She has one 8mm nodule in RUL of lung with follow up recommended in ~6-12 mos  10/24 we spoke with neurology yesterday who had nothing further to offer. Recommend f/u with neuro as outpt as well as other interventions I mentioned above in #8  10/29- will increase Dantrolene to 100 mg TID- probably will need Botulinum toxin after d/c? Will d/w Dr Emeline 12. Azotemia     10/19 pt with hx of CKD III per chart.    -may not be too far from baseline   -we discussed pushing fluids today--labs tomorrow  10/22 have reassured pt that renal function was stable.    -she's drinking plenty   10/23 renal fxn stable  10-25: Renal function improving  10/27- BUN up to 39- will replete with IVFs today after therapy and recheck in AM.   10/28- BUN down to 31- started IVFs at 8pm last night- so needs 16 hours of fluids- also pt drinking 1-2 cups/water/day- we discussed at length about drinking 6-8 cups/day- every day  10/30- still back down to not drinking much- will recheck labs in AM  10/31- drinking better- BUN down to 24- happy with current regimen  11/1 reviewed CMP from yesterday, BUN down to 24 reiterated, patient has been working on drinking more water.  She asked me to open her water bottle for her.  Continue to encourage.  11/2 BMP tomorrow, she reports she is drinking more water 12. Anemia  -drop to 10.7 today--no signs of blood loss, asymptomatic   10/24-recheck on Monday           -  B12 and Folate as well as iron panel reviewed and iron level borderline, TIBC low   -begin scheduled ferrous sulfate  10-26: Patient inquires about resumption of B12 supplementation.  Since within normal limits, defer to primary team.  -HGB stable 10.9 on 10/30  Recheck tomorrow       LOS: 17 days A FACE TO FACE EVALUATION WAS PERFORMED  Murray Collier 08/21/2024, 3:54 PM

## 2024-08-21 NOTE — Plan of Care (Signed)
  Problem: Consults Goal: RH GENERAL PATIENT EDUCATION Description: See Patient Education module for education specifics. Outcome: Progressing   Problem: RH BOWEL ELIMINATION Goal: RH STG MANAGE BOWEL WITH ASSISTANCE Description: STG Manage Bowel with min- mod Assistance. Outcome: Progressing   Problem: RH BLADDER ELIMINATION Goal: RH STG MANAGE BLADDER WITH ASSISTANCE Description: STG Manage Bladder With min-mod Assistance Outcome: Progressing   Problem: RH SKIN INTEGRITY Goal: RH STG SKIN FREE OF INFECTION/BREAKDOWN Description: Manage skin free of infection with min - mod assistance Outcome: Progressing   Problem: RH SAFETY Goal: RH STG ADHERE TO SAFETY PRECAUTIONS W/ASSISTANCE/DEVICE Description: STG Adhere to Safety Precautions With min- mod Assistance/Device. Outcome: Progressing   Problem: RH PAIN MANAGEMENT Goal: RH STG PAIN MANAGED AT OR BELOW PT'S PAIN GOAL Description: < 4 w/ prns Outcome: Progressing   Problem: RH KNOWLEDGE DEFICIT GENERAL Goal: RH STG INCREASE KNOWLEDGE OF SELF CARE AFTER HOSPITALIZATION Description: Manage increase knowledge of self after hospitalization with min- mod assistance from sister using educational materials provided Outcome: Progressing   Problem: Education: Goal: Knowledge of General Education information will improve Description: Including pain rating scale, medication(s)/side effects and non-pharmacologic comfort measures Outcome: Progressing   Problem: Health Behavior/Discharge Planning: Goal: Ability to manage health-related needs will improve Outcome: Progressing   Problem: Clinical Measurements: Goal: Ability to maintain clinical measurements within normal limits will improve Outcome: Progressing Goal: Will remain free from infection Outcome: Progressing Goal: Diagnostic test results will improve Outcome: Progressing Goal: Respiratory complications will improve Outcome: Progressing Goal: Cardiovascular complication  will be avoided Outcome: Progressing   Problem: Activity: Goal: Risk for activity intolerance will decrease Outcome: Progressing   Problem: Nutrition: Goal: Adequate nutrition will be maintained Outcome: Progressing   Problem: Coping: Goal: Level of anxiety will decrease Outcome: Progressing   Problem: Elimination: Goal: Will not experience complications related to bowel motility Outcome: Progressing Goal: Will not experience complications related to urinary retention Outcome: Progressing   Problem: Pain Managment: Goal: General experience of comfort will improve and/or be controlled Outcome: Progressing   Problem: Safety: Goal: Ability to remain free from injury will improve Outcome: Progressing   Problem: Skin Integrity: Goal: Risk for impaired skin integrity will decrease Outcome: Progressing

## 2024-08-22 LAB — CBC
HCT: 34.7 % — ABNORMAL LOW (ref 36.0–46.0)
Hemoglobin: 11.2 g/dL — ABNORMAL LOW (ref 12.0–15.0)
MCH: 29.6 pg (ref 26.0–34.0)
MCHC: 32.3 g/dL (ref 30.0–36.0)
MCV: 91.6 fL (ref 80.0–100.0)
Platelets: 184 K/uL (ref 150–400)
RBC: 3.79 MIL/uL — ABNORMAL LOW (ref 3.87–5.11)
RDW: 15.8 % — ABNORMAL HIGH (ref 11.5–15.5)
WBC: 5.8 K/uL (ref 4.0–10.5)
nRBC: 0 % (ref 0.0–0.2)

## 2024-08-22 LAB — BASIC METABOLIC PANEL WITH GFR
Anion gap: 13 (ref 5–15)
BUN: 32 mg/dL — ABNORMAL HIGH (ref 8–23)
CO2: 20 mmol/L — ABNORMAL LOW (ref 22–32)
Calcium: 8.6 mg/dL — ABNORMAL LOW (ref 8.9–10.3)
Chloride: 106 mmol/L (ref 98–111)
Creatinine, Ser: 0.88 mg/dL (ref 0.44–1.00)
GFR, Estimated: >60 mL/min (ref >60)
Glucose, Bld: 94 mg/dL (ref 70–99)
Potassium: 3.9 mmol/L (ref 3.5–5.1)
Sodium: 139 mmol/L (ref 135–145)

## 2024-08-22 NOTE — Discharge Instructions (Signed)
   COMMUNITY REFERRALS UPON DISCHARGE:    Home Health:   PT      OT        SNA                 Agency:[facility]          [phone]  *Please expect follow-up within 2-3 business days for discharge to schedule your home visit. If you have not received follow-up, be sure to contact the site directly.*   Medical Equipment/Items Ordered:specialty wheelchair                                                 Agency/Supplier:[employer] # [phone]  GENERAL COMMUNITY RESOURCES FOR PATIENT/FAMILY: 1) A referral for [facility] ([facility] for Adults with Debilitating Health problems) was submitted to [facility]. When this packet arrives, be sure to take to your PCP or [facility] for Dr. Lovorn to complete.     2) A referral was sent and currently accepted by [facility] [phone] for [facility] services. They will send referrals to the following agencies to see if they can accept for services : 1) [facility], 2) [facility], and 3) [facility]. You have been provided a list of home care agencies a well. Once an agency is in place, a nurse will come out to the home to complete an assessment to determine services needed.

## 2024-08-22 NOTE — Discharge Summary (Signed)
 Physician Discharge Summary  Patient ID: Kathy Owens MRN: [id number] DOB/AGE: 69-01-1956 69 y.o.  Admit date: 08/04/2024 Discharge date: 08/23/2024  Discharge Diagnoses:  Principal Problem:   Cerebral palsy, diplegic (HCC) Active Problems:   Hyperlipemia   Chronic pain due to cerebral palsy   Muscle spasticity   Spasticity   Chronic diarrhea of unknown origin   Chronic instability of right knee   Need for home health care   Impaired mobility   History of progressive weakness   Zoster, V1 distribution without ocular involvement   Rapidly progressive weakness   Coping style affecting medical condition   Discharged Condition: stable  Significant Diagnostic Studies: CT CHEST ABDOMEN PELVIS WO CONTRAST Result Date: 08/06/2024 EXAM: CT CHEST, ABDOMEN AND PELVIS WITHOUT CONTRAST 08/05/2024 11:46:50 AM TECHNIQUE: CT of the chest, abdomen and pelvis was performed without the administration of intravenous contrast. Multiplanar reformatted images are provided for review. Automated exposure control, iterative reconstruction, and/or weight based adjustment of the mA/kV was utilized to reduce the radiation dose to as low as reasonably achievable. COMPARISON: CT abdomen and pelvis 03/27/2024. CLINICAL HISTORY: Weight loss, unintended; Pt is a pt with Mild Cerebral palsy- that had mild spasticity- now it's SO bad that she cannot move legs and getting weaker- w/u for spine (-)- trying to figure out cause. FINDINGS: CHEST: MEDIASTINUM AND LYMPH NODES: Heart and pericardium are unremarkable. The central airways are clear. No mediastinal, hilar or axillary lymphadenopathy. LUNGS AND PLEURA: Scarring and mild bronchiolectasis in the lower lobes. Subpleural nodule in the posterior right upper lobe measuring 8 mm (series 6 image 33). No focal consolidation or pulmonary edema. No pleural effusion or pneumothorax. ABDOMEN AND PELVIS: LIVER: The liver is unremarkable. GALLBLADDER AND BILE DUCTS:  Cholelithiasis without evidence of acute cholecystitis. No biliary ductal dilatation. SPLEEN: No acute abnormality. PANCREAS: No acute abnormality. ADRENAL GLANDS: No acute abnormality. KIDNEYS, URETERS AND BLADDER: Cystic lesion in the posterior left kidney is unchanged from 2020 and likely a benign cyst. No follow up recommended. Punctate nonobstructing stone in the left kidney parenchymal calcification. No hydronephrosis. No perinephric or periureteral stranding. Urinary bladder is unremarkable. GI AND BOWEL: Stomach demonstrates no acute abnormality. There is no bowel obstruction. REPRODUCTIVE ORGANS: No acute abnormality. PERITONEUM AND RETROPERITONEUM: No ascites. No free air. VASCULATURE: Aorta is normal in caliber. Aortic atherosclerotic calcification. ABDOMINAL AND PELVIS LYMPH NODES: No lymphadenopathy. BONES AND SOFT TISSUES: Right hip arthroplasty. Streak artifact from the right hip arthroplasty obscures the right pelvis. No focal soft tissue abnormality. IMPRESSION: 1. Solitary 8 mm solid subpleural right upper lobe pulmonary nodule. According to the Fleischner Society pulmonary nodule recommendations, the recommendation is CT at 6-12 months, then consider CT at 18-24 months (for low-risk patients); for high-risk patients CT is recommended at 6-12 months, and again at 18-24 months. 2. No acute findings. Electronically signed by: Norman Gatlin MD 08/06/2024 01:12 AM EDT RP Workstation: [id number]   MR Lumbar Spine W Tommye Contrast Result Date: 07/28/2024 EXAM: MRI LUMBAR SPINE 07/28/2024 09:39:44 PM TECHNIQUE: Multiplanar multisequence MRI of the lumbar spine was performed without and with the administration of 8mL intravenous contrast (gadobutrol (GADAVIST) 1 MMOL/ML injection 8 mL GADOBUTROL 1 MMOL/ML IV SOLN). COMPARISON: None available. CLINICAL HISTORY: Increasing bilateral leg spasticity and weakness. FINDINGS: BONES AND ALIGNMENT: Normal alignment. Normal vertebral body heights. Bone marrow  signal is unremarkable. SPINAL CORD: The conus terminates normally. No abnormal enhancement.  No abnormal enhancement. SOFT TISSUES: No paraspinal mass. L1-L2: Broad disc bulge with ligamentum flavum thickening with  resulting severe canal stenosis. No significant foraminal stenosis. L2-L3: Mild disc bulge. No significant foraminal stenosis. Mild canal stenosis. L3-L4: Disc bulge and ligamentum flavum thickening. Superimposed right far lateral disc protrusion which closely approximates the exiting/exited right nerve. Mild right foraminal stenosis. Mild canal stenosis. L4-L5: Mild disc bulge and bilateral facet arthropathy. Mild canal stenosis. Mild left foraminal stenosis. L5-S1: No significant disc herniation. No spinal canal stenosis or neural foraminal narrowing. IMPRESSION: 1. At L1-L2, severe canal stenosis. 2. At L3-L4, right far lateral disc protrusion which closely approximates the exiting/exited right nerve. Mild right foraminal stenosis. Electronically signed by: Gilmore Molt MD 07/28/2024 11:34 PM EDT RP Workstation: [id number]   MR THORACIC SPINE W WO CONTRAST Result Date: 07/28/2024 EXAM: MRI THORACIC SPINE WITH AND WITHOUT INTRAVENOUS CONTRAST 07/27/2024 09:25:00 PM TECHNIQUE: Multiplanar multisequence MRI of the thoracic spine was performed with and without the administration of intravenous contrast. COMPARISON: Brain MRI and cervical spine MRI 07/26/2024, chest radiographs 05/04/2008. CLINICAL HISTORY: 68 year old female with neurologic deficit Myelopathy, acute, thoracic spine. FINDINGS: BONES AND ALIGNMENT: Limited imaging of the cervical spine appears stable from yesterday's MRI. Thoracic spine segmentation appears to be normal. Thoracic kyphosis is mildly exaggerated but appears similar to the 2019 radiographs. There is subtle degenerative appearing anterolisthesis in the upper thoracic spine most pronounced at T2-T3. Normal background bone marrow signal. Generally maintained thoracic  vertebral body height. Small chronic degenerative Endplate Schmorl nodes at multiple levels. No marrow edema or acute osseous abnormality identified. No suspicious enhancement or suspicious marrow lesion. SPINAL CORD: Capacious thoracic spinal canal at most levels and normal thoracic spinal cord signal and morphology at most levels. Conus medullaris occurs at T12-L1. No spinal cord edema or myelomalacia. No discrete spinal cord lesion. No abnormal intradural enhancement or dural thickening identified. SOFT TISSUES: There is a small 13 mm hemorrhagic cyst of the left renal upper pole which is exophytic with layering hematocrit level (series 20 x 46) which appears benign and no follow up imaging is recommended . Negative for other visible thoracic and upper abdominal findings. DEGENERATIVE CHANGES: Age advanced thoracic spine degeneration only at the following levels: T3-T4: Broad based right paracentral disc or disc osteophyte complex with effaced central CSF space and mild mass effect on the right hemicord (series 20 image 14), the capacious spinal canal. No spinal or foraminal stenosis. T6-T7: Broad based left paracentral disc or disc osteophyte complex with similar effaced central CSF space and mild ventral cord mass effect (series 20 image 24). Capacious spinal canal. No stenosis. T10-T11: Small but broad based left paracentral disc protrusion (series 20 image 42). Mild facet hypertrophy greater on the right with trace degenerative facet joint fluid. No stenosis. And Additionally at L1-L2 there is partially visible circumferential disc bulging and degenerative appearing posterior element hypertrophy which results in mild to moderate spinal stenosis at or near the level of the conus medullaris on series 17 image 11. No conus signal abnormality identified. IMPRESSION: 1. No acute findings in the thoracic spine. No significant thoracic spinal or foraminal stenosis. No thoracic spinal Cord Signal abnormality 2.  Positive for partially visualized L1-L2 level spinal stenosis, up to moderate and at or near the tip of the conus medullaris. No conus signal abnormality. Electronically signed by: Helayne Hurst MD 07/28/2024 04:22 AM EDT RP Workstation: [id number]   MR CERVICAL SPINE W WO CONTRAST Result Date: 07/27/2024 EXAM: MRI CERVICAL SPINE WITH AND WITHOUT CONTRAST 07/26/2024 08:40:42 PM TECHNIQUE: Multiplanar multisequence MRI of the cervical spine was performed without  and with the administration of intravenous contrast (7mL gadobutrol (GADAVIST) 1 MMOL/ML). COMPARISON: None available. CLINICAL HISTORY: Myelopathy, acute, cervical spine. Myelopathy, acute, cervical spine, weakness. FINDINGS: BONES AND ALIGNMENT: 2 mm of anterolisthesis of C4 on C5. Normal vertebral body heights. Marrow signal is unremarkable. No abnormal enhancement. SPINAL CORD: Normal spinal cord signal. SOFT TISSUES: No paraspinal mass. C2-C3: No significant disc herniation. No spinal canal stenosis or neural foraminal narrowing. C3-C4: Posterior disc osteophyte complex with bilateral facet and uncovertebral hypertrophy. Resulting moderate to severe left and moderate right foraminal stenosis. Patent canal. C4-C5: 2 mm of anterolisthesis of C4 on C5. Bilateral facet and uncovertebral hypertrophy. Severe left and moderate to severe right foraminal stenosis. Mild canal stenosis. C5-C6: Posterior disc osteophyte complex and bilateral facet hypertrophy. No significant stenosis. C6-C7: Small posterior disc osteophyte complex. No significant stenosis. C7-T1: No significant disc herniation. No spinal canal stenosis or neural foraminal narrowing. IMPRESSION: 1. At C4-C5, severe left and moderate to severe right foraminal stenosis. Mild canal stenosis. 2. At C3-C4, moderate to severe left and moderate right foraminal stenosis. Electronically signed by: Gilmore Molt MD 07/27/2024 01:22 AM EDT RP Workstation: [id number]   MR BRAIN W WO CONTRAST Result Date:  07/27/2024 EXAM: MRI BRAIN WITH AND WITHOUT CONTRAST 07/26/2024 08:40:42 PM TECHNIQUE: Multiplanar multisequence MRI of the head/brain was performed with and without the administration of intravenous contrast. COMPARISON: MRI head February 13, 2024 CLINICAL HISTORY: Neuro deficit, acute, stroke suspected 7ml gadavist. FINDINGS: BRAIN AND VENTRICLES: No acute infarct. No acute intracranial hemorrhage. No mass effect or midline shift. No hydrocephalus. Normal flow voids. No mass or abnormal enhancement. Cerebral atrophy. Moderate T2 hyperintensity in the white matter, compatible with chronic microvascular ischemic change. ORBITS: No acute abnormality. SINUSES: No acute abnormality. BONES AND SOFT TISSUES: Normal bone marrow signal and enhancement. No acute soft tissue abnormality. IMPRESSION: 1. No acute intracranial abnormality. Electronically signed by: Gilmore Molt MD 07/27/2024 12:03 AM EDT RP Workstation: [id number]   DG Chest Port 1 View Result Date: 07/23/2024 CLINICAL DATA:  Weakness.  Medication reaction. EXAM: PORTABLE CHEST 1 VIEW COMPARISON:  05/04/2018 FINDINGS: Lower lung volumes from prior exam. Stable heart size allowing for differences in technique. No confluent opacity, pleural effusion or pneumothorax. No pulmonary edema. Chronic right shoulder arthropathy. IMPRESSION: Lower lung volumes from prior exam. No acute findings. Electronically Signed   By: Andrea Gasman M.D.   On: 07/23/2024 23:14    Labs:  Basic Metabolic Panel: Recent Labs  Lab 08/16/24 0546 08/19/24 0504 08/22/24 0433  NA 137 139 139  K 3.8 4.2 3.9  CL 108 105 106  CO2 19* 21* 20*  GLUCOSE 103* 101* 94  BUN 31* 24* 32*  CREATININE 0.80 0.89 0.88  CALCIUM  8.3* 8.9 8.6*    CBC: Recent Labs  Lab 08/18/24 0456 08/22/24 0433  WBC 6.4 5.8  HGB 10.9* 11.2*  HCT 32.5* 34.7*  MCV 90.5 91.6  PLT 192 184    Brief HPI:   Kathy Owens is a 68 y.o. female with PMHx of cerebral palsy, herpes zoster with  current healing shingles, hypertension, anterior ischemic optic neuropathy, osteoporosis, hyperlipidemia, and mild spasticity. Recently, she was diagnosed with shingles in the V1 distribution and is currently on Valtrex.   The patient presented to [facility] on [facility]/2024 with gradual weakness and progressive spasticity in the bilateral lower extremities. Her functional baseline fluctuates, and she was able to walk with intermittent use of a wheelchair.  In the emergency department, labs indicated mild  electrolyte abnormalities and a urinary analysis consistent with infection. She was treated with electrolyte repletion and Fosfomycin for a possible UTI.  Ophthalmology consulted in the ED and ruled out ocular involvement but noted elevated intraocular pressure.    Per chart review patient had been on chronic baclofen  5 mg twice daily prior and had been tolerating dose, she experienced hallucinations on 10 mg. Dr. Cornelio was consulted for evaluation of  dramatically increased spasticity and increased weakness, who felt symptoms are not likely due to acute change from shingles/herpes zoster virus and recommended MRI and neurology consult. MRI of brain revealed no acute abnormalities.  MRI of the cervical and thoracic spine revealed C4-C5 severe left and moderate to severe right foraminal stenosis, mild canal stenosis, C3-C4 moderate to severe left and moderate right foraminal stenosis, no acute findings in thoracic spine but partially visualized L1-L2 level spinal stenosis up to moderate at or near the tip of the conus medullaris. The patient was not amenable to spinal surgery. Neurology evaluated and recommended consideration of switching spasmolytic medication to clonazepam, and consideration of Botox injections in outpatient setting.  Xarelto 10 mg for DVT prophylaxis 10/14.  Prior to arrival, she was living independently using a roller walker at home and in the community, but now requires moderate  assistance for transfers and bed mobility. Therapy evaluations completed due to patient decreased functional mobility was admitted for a comprehensive rehab program.     Inpatient Rehabilitation Course: Kathy Owens was admitted to rehab 08/04/2024 for inpatient therapies to consist of PT, ST and OT at least three hours five days a week. Past admission physiatrist, therapy team and rehab RN have worked together to provide customized collaborative inpatient rehab.  Anticoagulation: Xarelto for DVT prophylaxis.  Patient to continue for 1 month and follow-up with PCP outpatient.  Pain Management: Chronic pain due to cerebral palsy, continued on home Norco 5-325 mg as needed.  Mood/Behavior/Sleep: Stable  Zoster/V1 distribution: Completed 14-day course of Valtrex on 10/11.  Continue to apply Eucerin and Lacri-Lube artificial tears for gritty feeling and dryness in left eye.  Shingles vaccine recommended.  Outpatient follow-up with ophthalmology for elevated IOP.  Muscle spasticity: Related to cerebral palsy.  Patient tolerating Dantrium increase to 100 mg 3 times daily, unable to tolerate baclofen  as this caused hallucinations.  LFTs monitored and stable.  Patient will follow-up with Dr. Emeline outpatient for botulin toxin for left extremity spasticity.     Chronic diarrhea: Bowel management and regimen advised.  Electrolytes monitored closely.  Patient should continue Imodium  2 mg as needed.  Azotemia: History of CKD stage III.  Renal function stable after repletion with IV fluids.  Patient encouraged to drink p.o. fluids 6 to 8 cups daily.  Anemia: B12 and Folate as well as iron panel reviewed and iron level borderline, TIBC low.  Continue ferrous sulfate.    Hypertension:   (Orthostatic hypotension was monitored, and blood pressures remained controlled. Patient advised to get blood pressure cuff for monitoring at home. Patient reports access to BP cuff. Education for BP parameters  discussed. The patient is scheduled to establish care/follow up with PCP upon discharge)   Hyperlipidemia: Lipitor 10 mg daily   T2DM: diabetes has been monitored with ac/hs CBG checks and SSI was use prn for tighter BS control. Diabetic teaching was completed.     Planned Outpatient Follow-Up:   -PCP -PM&R -Ophthalmology, Dr. Maree    Rehab course: During patient's stay in rehab weekly team conferences were held to  monitor patient's progress, set goals and discuss barriers to discharge. At admission, patient required moderate assistance for transfers and bed mobility.    Occupational Therapy: Patient has met 5 of 5 (4 goals not applicable)  long term goals due to improved activity tolerance, improved balance, postural control, and ability to compensate for deficits. Pt made minimal progress with BADLs during this admission. Pt demonstrated improvements with sit<>stand and functional transfers with RW but continues to requires min A. Pt fatigues quickly. Pt declined extending her stay in Rehab and her sister declined attending education sessions. Pt eager to return home and pt's sister reported she has been caring for pt all her life and knows what to do.  Patient to discharge at overall supervision to min A for UB bathing and dressing and total for LB level.  Patient's care partner is independent to provide the necessary physical assistance at discharge.    Patient will benefit from ongoing skilled OT services in her home health setting to continue to advance functional skills in the area of BADL and reduce care partner burden.  Physical Therapy: Patient has met 2 of 7 long term goals due to improved activity tolerance, increased range of motion, and ability to compensate for deficits.  Patient to discharge at a wheelchair level Supervision.   Patient's care partner is independent to provide the necessary physical assistance at discharge. [name] has been unavailable for family  education during rehab stay. Pt provided with handout for PWC operations and charging. Pt and sister advised of pt's new level of function being different that PTA, but both state they are ready for discharge since they have been together for so long and vehemently decline family education or extension of rehab stay.   Patient will benefit from ongoing skilled PT services in home health setting to continue to advance safe functional mobility, address ongoing impairments in spasticity, safety with bed mobility and transfers, and minimize fall risk.  Discharge plan was discussed with patient and/or family member and they verbalized understanding and agreed with it.      Disposition:  There are no questions and answers to display.         Diet: Regular   Special Instructions:  -No driving or operating heavy machinery until cleared by provider  -No smoking or alcohol or illicit drug use    Discharge Instructions     Ambulatory referral to Ophthalmology   Complete by: As directed    Ambulatory referral to Physical Medicine Rehab   Complete by: As directed    Please schedule patient with Dr. Emeline for Botox injections.      Allergies as of 08/23/2024       Reactions   Firvanq  [vancomycin ] Hives   PO vancomycin  for C.diff (not IV).  TDD 11/09/18.   Ms Contin [morphine] Other (See Comments)   Hallucinations   Lioresal  [baclofen ] Other (See Comments)   Hallucinations    Nsaids Other (See Comments)   Hx kidney disease   Zanaflex  [tizanidine ] Other (See Comments)   Hallucinations         Medication List     PAUSE taking these medications    triamterene -hydrochlorothiazide  37.5-25 MG tablet Wait to take this until your doctor or other care provider tells you to start again. Commonly known as: MAXZIDE -25 Take 1 tablet by mouth daily.       STOP taking these medications    dantrolene 25 MG capsule Commonly known as: DANTRIUM  TAKE these medications     acetaminophen  325 MG tablet Commonly known as: TYLENOL  Take 1-2 tablets (325-650 mg total) by mouth every 4 (four) hours as needed for mild pain (pain score 1-3). What changed:  medication strength how much to take when to take this reasons to take this   alendronate  70 MG tablet Commonly known as: Fosamax  Take 1 tablet (70 mg total) by mouth every 7 (seven) days. Take with a full glass of water on an empty stomach. What changed:  when to take this additional instructions   artificial tears Oint ophthalmic ointment Commonly known as: LACRILUBE Place into the left eye 3 (three) times daily as needed for dry eyes.   atorvastatin  10 MG tablet Commonly known as: LIPITOR TAKE 1 TABLET(10 MG) BY MOUTH DAILY What changed:  how much to take how to take this when to take this additional instructions   bisacodyl 10 MG suppository Commonly known as: DULCOLAX Place 1 suppository (10 mg total) rectally daily as needed for moderate constipation.   cyclobenzaprine  5 MG tablet Commonly known as: FLEXERIL  Take 1 tablet (5 mg total) by mouth 3 (three) times daily. What changed: additional instructions   dantrolene 100 MG capsule Commonly known as: DANTRIUM Take 1 capsule (100 mg total) by mouth 3 (three) times daily.   ferrous sulfate 325 (65 FE) MG tablet Take 1 tablet (325 mg total) by mouth daily with breakfast.   Gerhardt's butt cream Crea Apply 1 Application topically 2 (two) times daily.   hydrocerin Crea Apply 1 Application topically 2 (two) times daily as needed.   HYDROcodone -acetaminophen  5-325 MG tablet Commonly known as: NORCO/VICODIN Take 1 tablet by mouth every 6 (six) hours as needed for moderate pain (pain score 4-6) or severe pain (pain score 7-10).   loperamide  2 MG capsule Commonly known as: IMODIUM  TAKE 1 CAPSULE(2 MG) BY MOUTH DAILY FOR UP TO 360 DOSES AS NEEDED FOR DIARRHEA OR LOOSE STOOLS What changed: See the new instructions.   rivaroxaban 10 MG  Tabs tablet Commonly known as: XARELTO Take 1 tablet (10 mg total) by mouth daily.   traZODone 50 MG tablet Commonly known as: DESYREL Take 1 tablet (50 mg total) by mouth at bedtime as needed for sleep.        Follow-up Information     Trudy Mliss Dragon, MD Follow up.   Specialty: Internal Medicine Why: Call for an appointment. Contact information: [address], [address] [place] [address] [phone]         Cornelio Bouchard, MD Follow up.   Specialty: Physical Medicine and Rehabilitation Why: Office will call for appointment Contact information: [address] [place] [address] [phone]         Maree Lonni Inks, MD Follow up.   Specialty: Ophthalmology Why: Call for an appointment. Contact information: [address] [place] [address] [phone]                 Signed: Daphne LOISE Satterfield 08/22/2024, 2:49 PM

## 2024-08-22 NOTE — Progress Notes (Signed)
 Occupational Therapy Session Note  Patient Details  Name: Kathy Owens MRN: [id number] Date of Birth: Jan 24, 1956  Today's Date: 08/22/2024 OT Individual Time: 1330-1425 OT Individual Time Calculation (min): 55 min    Short Term Goals: Week 2:  OT Short Term Goal 1 (Week 2): PT will don shirt with mod A in unsupported sitting OT Short Term Goal 1 - Progress (Week 2): Met OT Short Term Goal 2 (Week 2): Pt don LB clothing with mod A OT Short Term Goal 2 - Progress (Week 2): Discontinued (comment) OT Short Term Goal 3 (Week 2): Pt will performed supine to sitting EOB with mod A OT Short Term Goal 3 - Progress (Week 2): Discontinued (comment)  Skilled Therapeutic Interventions/Progress Updates:    Pt resting in PWC upon arrival. Skilled OT intervention with focus on repositioning in PWC, sit<>stand, PWC mobility, and discharge planning to prepare for d/c home tomorrow and increase independence with BADLs and direct care. Pt required min A for repositionig in w/c. Sit<>stand from [facility] with min A and increased time to positiong BLE on floor. PWC mobility with supervision and min verbal cues for safety. Pt inquired if she should walk by herself at home. Recommended that pt wait until cleared bu HH therapies. Pt verbalized understanding. Reviewed home safety recommendations. Pt remained in w/c with all needs within reach.   Therapy Documentation Precautions:  Precautions Precautions: Fall Recall of Precautions/Restrictions: Intact Precaution/Restrictions Comments: hx of CP with spasticity Restrictions Weight Bearing Restrictions Per Provider Order: No Pain: Pain Assessment Pain Scale: 0-10 Pain Score: 0-No pain   Therapy/Group: Individual Therapy  [name] 08/22/2024, 3:03 PM

## 2024-08-22 NOTE — Progress Notes (Signed)
 PROGRESS NOTE   Subjective/Complaints:  Pt reports she can now move her toes/feet B/L on her own- which is better.  She also feels like knee/ankle tightness better- not really hips.   Usually eats her entire tray, but only ate ~40% today.   Asking fi can get Norco at d/c, Dr Trudy writes for it.    ROS: per HPI    Pt denies SOB, abd pain, CP, N/V/C/D, and vision changes  No significant change in spasticity- still severe (+)   Objective:   No results found.  Recent Labs    08/22/24 0433  WBC 5.8  HGB 11.2*  HCT 34.7*  PLT 184    Recent Labs    08/22/24 0433  NA 139  K 3.9  CL 106  CO2 20*  GLUCOSE 94  BUN 32*  CREATININE 0.88  CALCIUM  8.6*    Intake/Output Summary (Last 24 hours) at 08/22/2024 0833 Last data filed at 08/22/2024 0606 Gross per 24 hour  Intake 680 ml  Output --  Net 680 ml        Physical Exam: Vital Signs Blood pressure 134/68, pulse 67, temperature 97.7 F (36.5 C), temperature source Oral, resp. rate 16, height 5' (1.524 m), weight 71.6 kg, SpO2 97%.      General: awake, alert, appropriate, sitting up in bed; 40% of tray eaten; NAD HENT: conjugate gaze; oropharynx moist CV: regular rate and rhythm; no JVD Pulmonary: CTA B/L; no W/R/R- good air movement GI: soft, NT, ND, (+)BS Psychiatric: appropriate- joking, but nervous Neurological: Ox3 MAS of 3-4 in knees, but still MAS of 4 in hips and ankles.  Cannot tell if dystonia due to severity  Ext: no clubbing, cyanosis, or edema Psych: pleasant and cooperative  Skin: Left V1 rash--resolved  Neurological: Alert and oriented x 3. Normal insight and awareness. Intact Memory. Normal language, slight dysarthria. Cranial nerve exam unremarkable.     UE strength RUE- 4-4+ proximally, Grip 4-/5, FA 3-/5 LUE- Deltoid, biceps, triceps 4/5- WE 4-/5; grip 3+/5 and FA 2/5 RLE-tone 3 prox to 3 to 4 distally LLE- tone-  proximally -->dis  3+--> 3+ to 4  Physical exam unchanged from the above on reexamination 08/22/24     Assessment/Plan: 1. Functional deficits which require 3+ hours per day of interdisciplinary therapy in a comprehensive inpatient rehab setting. Physiatrist is providing close team supervision and 24 hour management of active medical problems listed below. Physiatrist and rehab team continue to assess barriers to discharge/monitor patient progress toward functional and medical goals  Care Tool:  Bathing    Body parts bathed by patient: Face, Chest, Abdomen, Right arm, Left arm, Right upper leg, Left upper leg   Body parts bathed by helper: Right lower leg, Left lower leg, Front perineal area, Buttocks     Bathing assist Assist Level: Moderate Assistance - Patient 50 - 74%     Upper Body Dressing/Undressing Upper body dressing   What is the patient wearing?: Pull over shirt    Upper body assist Assist Level: Minimal Assistance - Patient > 75%    Lower Body Dressing/Undressing Lower body dressing      What is the patient wearing?:  Pants, Incontinence brief     Lower body assist Assist for lower body dressing: Total Assistance - Patient < 25%     Toileting Toileting    Toileting assist Assist for toileting: 2 Helpers     Transfers Chair/bed transfer  Transfers assist     Chair/bed transfer assist level: Minimal Assistance - Patient > 75%     Locomotion Ambulation   Ambulation assist      Assist level: Moderate Assistance - Patient 50 - 74% Assistive device: Parallel bars Max distance: 3   Walk 10 feet activity   Assist  Walk 10 feet activity did not occur: Safety/medical concerns        Walk 50 feet activity   Assist Walk 50 feet with 2 turns activity did not occur: Safety/medical concerns         Walk 150 feet activity   Assist Walk 150 feet activity did not occur: Safety/medical concerns         Walk 10 feet on uneven surface   activity   Assist Walk 10 feet on uneven surfaces activity did not occur: Safety/medical concerns         Wheelchair     Assist Is the patient using a wheelchair?: Yes Type of Wheelchair: Power    Wheelchair assist level: Contact Guard/Touching assist Max wheelchair distance: 200+    Wheelchair 50 feet with 2 turns activity    Assist        Assist Level: Contact Guard/Touching assist   Wheelchair 150 feet activity     Assist      Assist Level: Contact Guard/Touching assist   Blood pressure 134/68, pulse 67, temperature 97.7 F (36.5 C), temperature source Oral, resp. rate 16, height 5' (1.524 m), weight 71.6 kg, SpO2 97%.  Medical Problem List and Plan: 1. Functional deficits secondary to cerebral palsy/ massive increase in spasticity             -patient may  shower             -ELOS/Goals: 3-4 weeks- min-mod A hopefully            - w/c evaluation while here  Con't CIR PT and OT  Sister not coming in for family training- concerned about pt's d/c, however pt AND sister are insistent to go home tomorrow.   D/c 11/4  D/w pt after her request that back surgery would be the only way and not 100% that could help her legs get stronger due to severe spinal stenosis. Still willing to go home with power w/c  Stopped supervision with meals IS going home with power w/c-   Will meet criteria for power w/c- due to her dx of Diplegic Cerebral palsy as well as Severe lumbar stenosis/myelopathy, causing severe LE weakness and associated SEVERE LE spasticity- needs power w/c, so can transfer downhill every time- needs elevater function for this- needs recline and tilt and space due to bladder; and to reduce risk of pressure ulcers.  2.  Antithrombotics: -DVT/anticoagulation:  Pharmaceutical: Xarelto             -antiplatelet therapy: n/a   3. Pain Management: Hx of chronic pain d/t cerebral palsy-continue on home hydrocodone -acetaminophen  5-3 25 as needed  - 11/1  patient on Norco 5 from outpatient provider, patient says that she called her outpatient provider, per patient report it sounds like outpatient provider plans to wait to refill until closer to discharge in case dose was can be changed.  Asked  if she is on a pain agreement she and patient reported she did have this.  I recommended she contact the office early next week for discharge.  -11/2 reports pain overall under control 4. Mood/Behavior/Sleep: LCSW to follow for evaluation and support when available.              -antipsychotic agents: n/a   10/30- will d/w pt about her mood- and maybe adding SSRI  5. Neuropsych/cognition: This patient is capable of making decisions on her own behalf.   6. Skin/Wound Care: routine pressure relief measures.             -monitor lesion over V1 distribution on left-continue Eucerin    10/20- will con't Rewetting drops frequently for L eye- due to dryness  10/30- healed, but c/o dry L eye and swelling- will have NP assess   10/31- added lacrilube for L eye- gritty feeling- and dryness- eye not red this AM 7. Fluids/Electrolytes/Nutrition: Monitor I&O with f/u labs in a.m.              -regular diet + Ensure, eating well   - 10-25: Electrolytes, magnesium within normal limits.  Patient asked multiple times about magnesium repletion, reassured her that it is in her normal range.   8. Muscle spacticity/CP: Resumed dantrolene 25 mg BID on 10/10. Flexeril  5 mg TID              - Monitor CBC and CMP             Will titrate dantrolene up tomorrow to TID  10/17- increased Dantrolene to 25 mg TID today- encouraged pt to take, since her spasticity is so severe.   10/18-19-- no significant change at this point. Increase dantrium to 25mg  qid. Pt is on board. Will need further titration if tolerated.   10/24 tolerating the dantrium increase to 50 TID. But I agree with her that there hasn't been dramatic change. Will increase to 50mg  qid today. We discussed other options  as an outpt which could include botox and a baclofen  pump. Check LFT's Monday. 10-26: Tone not bothersome over this weekend, no gross spasms  10/27- Tone is down to 3-4 from a strong 4 prior- is looser- will wait to increase Dantrolene since only been 3 days since last increased.   10/29- increased Dantrolene to 100 mg TID- pt cannot tolerate Baclofen - caused hallucinations- could do Zanaflex , but concerned could drop BP- if Dantrolene not enough, could do low dose Botulinum toxin   10/30- Will check CMP in AM- to make sure liver is OK and Kidneys- still not drinking much  10/31- LFTs look great- 18 and 17- con't dantrolene  11/3- Will need f/u with Dr Emeline as well as myself for Botox 9.  Zoster/V1 distribution:  Completed 14-day course of Valtrex on 10/11 but would benefit from zoster VAX.  Outpatient follow-up with ophthalmology d/t elevated IOP on 9/27.             - Continue as needed eyedrops    10/23-24 eye drops helping, eye/skin looks much better . 11/3- added lacrilube last week- doing better 9. HLD: Lipitor 10 mg    10. Hx of Chronic Diarrhea:  monitor potassium and magnesium labs, continue Imodium  2 mg daily.     - 10/25: 5x liquid BM overnight; add back immodium PRN as appears it was Dced-patient request this to be scheduled twice daily.-  No leukocytosis or fevers to indicate infectious cause.  10-26: No further  bowel movements.  Reduce Imodium  to 2 mg daily  10/28- LBM 10/25- might need bowel intervention- will give her day then do Sorbitol   10/29- Small BM yesterday- needs Sorbitol -will try Miralax x1  10/31- tried sorbitol yesterday- had BM and needs to have one this AM  11/1 had large type IV bowel movement this morning, continue to monitor  11/2 BM today, continue to follow  11. Cause of spasticity?  10/17- when I got approval to bring pt to CIR, the insurance peer to peer asked that I work up more what might be causing pt's increased spasticity- I ordered CT of  Chest, abd and pelvis- and if that is not fruitful, will d/w Neurology if they have any ideas- Peer to peer wanted an LP. CT not resulted yet- does have severe canal stenosis at L1/2 but wouldn't explain her spasticity, just weakness. Per chart, pt refused surgery- will d/w pt again next Monday  10/18 CT of abdomen and pelvis really unremarkable. She has one 8mm nodule in RUL of lung with follow up recommended in ~6-12 mos  10/24 we spoke with neurology yesterday who had nothing further to offer. Recommend f/u with neuro as outpt as well as other interventions I mentioned above in #8  10/29- will increase Dantrolene to 100 mg TID- probably will need Botulinum toxin after d/c? Will d/w Dr Emeline 12. Azotemia     10/19 pt with hx of CKD III per chart.    -may not be too far from baseline   -we discussed pushing fluids today--labs tomorrow  10/22 have reassured pt that renal function was stable.    -she's drinking plenty   10/23 renal fxn stable  10-25: Renal function improving  10/27- BUN up to 39- will replete with IVFs today after therapy and recheck in AM.   10/28- BUN down to 31- started IVFs at 8pm last night- so needs 16 hours of fluids- also pt drinking 1-2 cups/water/day- we discussed at length about drinking 6-8 cups/day- every day  10/30- still back down to not drinking much- will recheck labs in AM  10/31- drinking better- BUN down to 24- happy with current regimen  11/1 reviewed CMP from yesterday, BUN down to 24 reiterated, patient has been working on drinking more water.  She asked me to open her water bottle for her.  Continue to encourage.  11/2 BMP tomorrow, she reports she is drinking more water  11/3- BMP shows BUN of 32- no better- I think this is her baseline, not drinking due to bladder incontinence.  12. Anemia  -drop to 10.7 today--no signs of blood loss, asymptomatic   10/24-recheck on Monday           -  B12 and Folate as well as iron panel reviewed and iron level  borderline, TIBC low   -begin scheduled ferrous sulfate  10-26: Patient inquires about resumption of B12 supplementation.  Since within normal limits, defer to primary team.  -HGB stable 10.9 on 10/30  Recheck tomorrow   11/3- Hb 11.2!  Patient will need f/u with Dr [place] Kretz for f/u on CP/spasticity-  as well as Dr Emeline for Boulinum toxin for LE's spasticity as well as pt to call Dr Mliss Pouch her PCP- to get refills on Norco- can get 5 days of pain meds at d/c.       LOS: 18 days A FACE TO FACE EVALUATION WAS PERFORMED  Kathy Owens 08/22/2024, 8:33 AM

## 2024-08-22 NOTE — Progress Notes (Signed)
 Physical Therapy Discharge Summary  Patient Details  Name: Kathy Owens MRN: [id number] Date of Birth: [birth year]  Date of Discharge from PT service:August 22, 2024  Today's Date: 08/22/2024 PT Individual Time: 1000-1100 PT Individual Time Calculation (min): 60 min    Patient has met 2 of 7 long term goals due to improved activity tolerance, increased range of motion, and ability to compensate for deficits.  Patient to discharge at a wheelchair level Supervision.   Patient's care partner is independent to provide the necessary physical assistance at discharge. [name] has been unavailable for family education during rehab stay. Pt provided with handout for PWC operations and charging. Pt and sister advised of pt's new level of function being different that PTA, but both state they are ready for discharge since they have been together for so long and vehemently decline family education or extension of rehab stay.   Reasons goals not met: pt with continued spasticity that limits her mobility. She continues to require +2 assist for safety for bed mobility and standing transfers with RW. Pt declined extension.   Recommendation:  Patient will benefit from ongoing skilled PT services in home health setting to continue to advance safe functional mobility, address ongoing impairments in spasticity, safety with bed mobility and transfers, and minimize fall risk.  Equipment: Specialty PWC through numotion   Reasons for discharge: discharge from hospital  Patient/family agrees with progress made and goals achieved: Yes  Skilled Therapeutic Interventions/Progress Updates:  pt received in bed and agreeable to therapy. No complaint of pain. Pt assisted with donning pants in supine with max x 2 for rolling. Donned shirt sitting EOB. Pt requires max x 2 for supine>sit d/t strong extensor tone.   Stretching PWC mob  PT Discharge Precautions/Restrictions Precautions Precautions:  Fall Precaution/Restrictions Comments: hx of CP with spasticity Restrictions Weight Bearing Restrictions Per Provider Order: No Vital Signs Therapy Vitals Temp: 98 F (36.7 C) Temp Source: Oral Pulse Rate: 83 Resp: 16 BP: (!) 116/95 (RN Notified) Patient Position (if appropriate): Sitting Oxygen Therapy SpO2: 99 % O2 Device: Room Air Pain Pain Assessment Pain Scale: 0-10 Pain Score: 0-No pain Pain Interference Pain Interference Pain Effect on Sleep: 0. Does not apply - I have not had any pain or hurting in the past 5 days Pain Interference with Therapy Activities: 1. Rarely or not at all Pain Interference with Day-to-Day Activities: 1. Rarely or not at all Vision/Perception  Vision - History Ability to See in Adequate Light: 0 Adequate Vision - Assessment Additional Comments: Lt eye edema and redness present Perception Perception: Within Functional Limits Praxis Praxis: WFL  Cognition Overall Cognitive Status: Within Functional Limits for tasks assessed Arousal/Alertness: Awake/alert Orientation Level: Oriented X4 Day of Week: Correct Attention: Sustained Sustained Attention: Appears intact Memory: Appears intact Awareness: Appears intact Problem Solving: Appears intact Safety/Judgment: Appears intact Sensation Sensation Light Touch: Appears Intact Hot/Cold: Appears Intact Proprioception: Appears Intact Stereognosis: Not tested Coordination Gross Motor Movements are Fluid and Coordinated: No Fine Motor Movements are Fluid and Coordinated: No Coordination and Movement Description: limited by spasticity, weakness Motor  Motor Motor: Abnormal tone;Abnormal postural alignment and control Motor - Skilled Clinical Observations: limited by spasticity Motor - Discharge Observations: limited by spasticity, extensor tone  Mobility Bed Mobility Bed Mobility: Rolling Right;Rolling Left;Left Sidelying to Sit Rolling Right: Maximal Assistance - Patient 25-49%;2  Helpers Rolling Left: Maximal Assistance - Patient 25-49%;2 Helpers Left Sidelying to Sit: Maximal Assistance - Patient 25-49%;2 Helpers Transfers Transfers: Sit to Stand;Stand to  Sit Sit to Stand: Minimal Assistance - Patient > 75%;2 Helpers Stand to Sit: Maximal Assistance - Patient 25-49%;2 Helpers Stand Pivot Transfers: Maximal Assistance - Patient 25 - 49%;2 Helpers Transfer (Assistive device): Rolling walker Locomotion  Gait Ambulation: Yes Gait Assistance: Moderate Assistance - Patient 50-74%;2 Helpers Gait Distance (Feet): 29 Feet Assistive device: Rolling walker Gait Assistance Details: Verbal cues for sequencing;Verbal cues for precautions/safety;Verbal cues for gait pattern;Manual facilitation for weight shifting;Manual facilitation for weight bearing Gait Gait: Yes Gait Pattern: Impaired Gait Pattern: Left foot flat;Right foot flat;Scissoring;Right flexed knee in stance;Left flexed knee in stance Gait velocity: slowed Stairs / Additional Locomotion Stairs: No Pick up small object from the floor (from standing position) activity did not occur: Safety/medical concerns Naval Architect Mobility: Yes Wheelchair Assistance: Doctor, General Practice: Power Wheelchair Parts Management: Supervision/cueing Distance: 300  Trunk/Postural Assessment  Cervical Assessment Cervical Assessment: Within Film/video Editor Assessment: Within Functional Limits Lumbar Assessment Lumbar Assessment: Within Functional Limits Postural Control Postural Control: Deficits on evaluation Righting Reactions: delayed Postural Limitations: difficulty with trunk flexion at the hips in sitting and bringing pelvis to neurtral  Balance Balance Balance Assessed: Yes Static Sitting Balance Static Sitting - Balance Support: Bilateral upper extremity supported Static Sitting - Level of Assistance: 5: Stand by assistance Static  Standing Balance Static Standing - Balance Support: During functional activity Static Standing - Level of Assistance: 4: Min assist Extremity Assessment  RUE Assessment General Strength Comments: 2-/5 unable to raise to 90 degrees against gravity. LUE Assessment LUE Assessment: Within Functional Limits General Strength Comments: 3/5 RLE Assessment Passive Range of Motion (PROM) Comments: grossly limited by spasticity RLE Strength Right Hip Flexion: 3+/5 Right Knee Flexion: 4-/5 Right Knee Extension: 4-/5 Right Ankle Dorsiflexion: 4+/5 Right Ankle Plantar Flexion: 4/5 RLE Tone RLE Tone: Modified Ashworth Body Part - Modified Ashworth Scale: Hamstrings;Gastrocnemius;Quadriceps Hamstrings - Modified Ashworth Scale for Grading Hypertonia RLE: Considerable increase in muschle tone, passive movement difficult QUADRICEPS - Modified Ashworth Scale for Grading Hypertonia RLE: Affected part(s) rigid in flexion or extension GASTROCNEMIUS - Modified Ashworth Scale for Grading Hypertonia RLE: Considerable increase in muschle tone, passive movement difficult LLE Assessment LLE Assessment: Exceptions to [facility] General Strength Comments: grossly 4/5; ankle PF/DF 2/5 LLE Tone LLE Tone: Modified Ashworth Body Part - Modified Ashworth Scale: Hamstrings;Quadriceps;Gastrocnemius Hamstrings - Modified Ashworth Scale for Grading Hypertonia LLE: Considerable increase in muschle tone, passive movement difficult Quadriceps - Modified Ashworth Scale for Grading Hypertonia LLE: Affected part(s) rigid in flexion or extension Gastrocnemius - Modified Ashworth Scale for Grading Hypertonia LLE: Considerable increase in muschle tone, passive movement difficult   [name] 08/22/2024, 4:16 PM

## 2024-08-22 NOTE — Plan of Care (Signed)
  Problem: Consults Goal: RH GENERAL PATIENT EDUCATION Description: See Patient Education module for education specifics. Outcome: Progressing   Problem: RH BOWEL ELIMINATION Goal: RH STG MANAGE BOWEL WITH ASSISTANCE Description: STG Manage Bowel with min- mod Assistance. Outcome: Progressing   Problem: RH BLADDER ELIMINATION Goal: RH STG MANAGE BLADDER WITH ASSISTANCE Description: STG Manage Bladder With min-mod Assistance Outcome: Progressing   Problem: RH SKIN INTEGRITY Goal: RH STG SKIN FREE OF INFECTION/BREAKDOWN Description: Manage skin free of infection with min - mod assistance Outcome: Progressing   Problem: RH SAFETY Goal: RH STG ADHERE TO SAFETY PRECAUTIONS W/ASSISTANCE/DEVICE Description: STG Adhere to Safety Precautions With min- mod Assistance/Device. Outcome: Progressing   Problem: RH PAIN MANAGEMENT Goal: RH STG PAIN MANAGED AT OR BELOW PT'S PAIN GOAL Description: < 4 w/ prns Outcome: Progressing   Problem: RH KNOWLEDGE DEFICIT GENERAL Goal: RH STG INCREASE KNOWLEDGE OF SELF CARE AFTER HOSPITALIZATION Description: Manage increase knowledge of self after hospitalization with min- mod assistance from sister using educational materials provided Outcome: Progressing   Problem: Education: Goal: Knowledge of General Education information will improve Description: Including pain rating scale, medication(s)/side effects and non-pharmacologic comfort measures Outcome: Progressing   Problem: Health Behavior/Discharge Planning: Goal: Ability to manage health-related needs will improve Outcome: Progressing   Problem: Clinical Measurements: Goal: Ability to maintain clinical measurements within normal limits will improve Outcome: Progressing Goal: Will remain free from infection Outcome: Progressing Goal: Diagnostic test results will improve Outcome: Progressing Goal: Respiratory complications will improve Outcome: Progressing Goal: Cardiovascular complication  will be avoided Outcome: Progressing   Problem: Activity: Goal: Risk for activity intolerance will decrease Outcome: Progressing   Problem: Nutrition: Goal: Adequate nutrition will be maintained Outcome: Progressing   Problem: Coping: Goal: Level of anxiety will decrease Outcome: Progressing   Problem: Elimination: Goal: Will not experience complications related to bowel motility Outcome: Progressing Goal: Will not experience complications related to urinary retention Outcome: Progressing   Problem: Pain Managment: Goal: General experience of comfort will improve and/or be controlled Outcome: Progressing   Problem: Safety: Goal: Ability to remain free from injury will improve Outcome: Progressing   Problem: Skin Integrity: Goal: Risk for impaired skin integrity will decrease Outcome: Progressing

## 2024-08-22 NOTE — Progress Notes (Signed)
 Occupational Therapy Session Note  Patient Details  Name: Kathy Owens MRN: [id number] Date of Birth: [birth year]  Today's Date: 08/22/2024 OT Individual Time: 1140-1210 OT Individual Time Calculation (min): 30 min    Short Term Goals: Week 3:  OT Short Term Goal 1 (Week 3): STG=LTG 2/2 ELOS (LTG downgraded 2/2 lack of progress)  Skilled Therapeutic Interventions/Progress Updates:      Therapy Documentation Precautions:  Precautions Precautions: Fall Recall of Precautions/Restrictions: Intact Precaution/Restrictions Comments: hx of CP with spasticity Restrictions Weight Bearing Restrictions Per Provider Order: No General: Pt seated in W/C upon OT arrival, agreeable to OT.  Pain: no pain reported  Other Treatments: OT providing skilled intervention and education for W/C use. OT issuing handout for pt and pt caregiver on W/C functions and controls since pt caregiver unable to attend family training. Pt very appreciative of handout and information.    Pt seated in W/C at end of session with W/C alarm donned, in pressure relief position, call light within reach and 4Ps assessed.    Therapy/Group: Individual Therapy  [name], OTD, OTR/L 08/22/2024, 4:16 PM

## 2024-08-22 NOTE — Progress Notes (Signed)
 Occupational Therapy Discharge Summary  Patient Details  Name: Kathy Owens MRN: [id number] Date of Birth: Jul 25, 1956  Date of Discharge from OT service:August 22, 2024  Patient has met 5 of 5 (4 goals not applicable)  long term goals due to improved activity tolerance, improved balance, postural control, and ability to compensate for deficits. Pt made minimal progress with BADLs during this admission. Pt demonstrated improvements with sit<>stand and functional transfers with RW but continues to requires min A. Pt fatigues quickly. Pt declined extending her stay in Rehab and her sister declined attending education sessions. Pt eager to return home and pt's sister reported she has been caring for pt all her life and knows what to do.  Patient to discharge at overall supervision to min A for UB bathing and dressing and total for LB level.  Patient's care partner is independent to provide the necessary physical assistance at discharge.    Reasons goals not met: n/a  Recommendation:  Patient will benefit from ongoing skilled OT services in home health setting to continue to advance functional skills in the area of BADL and Reduce care partner burden.  Equipment: No equipment provided  Reasons for discharge: discharge from hospital  Patient/family agrees with progress made and goals achieved: Yes  OT Discharge ADL ADL Eating: Minimal assistance Where Assessed-Eating: Wheelchair Grooming: Minimal assistance Where Assessed-Grooming: Sitting at sink Upper Body Bathing: Minimal assistance Where Assessed-Upper Body Bathing: Shower Lower Body Bathing: Maximal assistance Where Assessed-Lower Body Bathing: Shower Upper Body Dressing: Minimal assistance Where Assessed-Upper Body Dressing: Wheelchair Lower Body Dressing: Dependent Where Assessed-Lower Body Dressing: Bed level Toileting: Dependent Where Assessed-Toileting: Bed level Toilet Transfer: Dependent Vision Baseline  Vision/History: 0 No visual deficits Patient Visual Report: No change from baseline Additional Comments: Lt eye edema and redness present Perception  Perception: Within Functional Limits Praxis Praxis: WFL Cognition Cognition Overall Cognitive Status: Within Functional Limits for tasks assessed Arousal/Alertness: Awake/alert Orientation Level: Person;Place;Situation Person: Oriented Place: Oriented Situation: Oriented Memory: Appears intact Attention: Sustained Sustained Attention: Appears intact Awareness: Appears intact Problem Solving: Appears intact Safety/Judgment: Appears intact Brief Interview for Mental Status (BIMS) Repetition of Three Words (First Attempt): 3 Temporal Orientation: Year: Correct Temporal Orientation: Month: Accurate within 5 days Temporal Orientation: Day: Correct Recall: Sock: Yes, no cue required Recall: Blue: Yes, no cue required Recall: Bed: Yes, no cue required BIMS Summary Score: 15 Sensation Sensation Light Touch: Appears Intact Hot/Cold: Appears Intact Proprioception: Appears Intact Stereognosis: Not tested Coordination Gross Motor Movements are Fluid and Coordinated: No Fine Motor Movements are Fluid and Coordinated: No Coordination and Movement Description: limited by spasticity, weakness Motor  Motor Motor: Abnormal tone;Abnormal postural alignment and control Motor - Skilled Clinical Observations: limited by spasticity Mobility  Bed Mobility Bed Mobility: Rolling Right;Rolling Left;Left Sidelying to Sit Rolling Right: Maximal Assistance - Patient 25-49% Rolling Left: Maximal Assistance - Patient 25-49% Left Sidelying to Sit: Maximal Assistance - Patient 25-49% Transfers Sit to Stand: Minimal Assistance - Patient > 75% Stand to Sit: Moderate Assistance - Patient 50-74%  Trunk/Postural Assessment  Cervical Assessment Cervical Assessment: Within Functional Limits Thoracic Assessment Thoracic Assessment: Within  Functional Limits Lumbar Assessment Lumbar Assessment: Within Functional Limits Postural Control Postural Control: Deficits on evaluation Postural Limitations: difficulty with trunk flexion at the hips in sitting and bringing pelvis to neurtral  Balance Static Sitting Balance Static Sitting - Level of Assistance: 5: Stand by assistance Static Standing Balance Static Standing - Balance Support: During functional activity Static Standing - Level of Assistance:  4: Min assist Extremity/Trunk Assessment RUE Assessment General Strength Comments: 2-/5 unable to raise to 90 degrees against gravity. LUE Assessment LUE Assessment: Within Functional Limits General Strength Comments: 3/5   Kathy Owens 08/22/2024, 3:12 PM

## 2024-08-22 NOTE — Progress Notes (Addendum)
 Patient ID: Kathy Owens, female   DOB: [birth year], 68 y.o.   MRN: [id number]  SW faxed PCS referral to [facility] ([phone]/[phone]). *Referral received.  SW spoke with Holly/[facility] to discuss PCS referral. Referral currently approved. Will send referral to the following agencies: 1) [facility], [facility],and [facility]. Once an agency is selected, a nurse will come out to complete a home assessment.   [facility] ambulance scheduled for home discharge; pick up at 11am.   1129- SW left message for pt sister [name] on above. SW encouraged follow-up if needed.   *SW met with pt in room to discuss above.   SW waiting to confirm if any other DME needed other than specialty w/c.   Graeme Jude, MSW, LCSW Office: [phone] Cell: [phone] Fax: [phone]

## 2024-08-23 ENCOUNTER — Other Ambulatory Visit: Payer: Self-pay

## 2024-08-23 ENCOUNTER — Other Ambulatory Visit (HOSPITAL_COMMUNITY): Payer: Self-pay

## 2024-08-23 ENCOUNTER — Telehealth: Payer: Self-pay | Admitting: *Deleted

## 2024-08-23 ENCOUNTER — Observation Stay (HOSPITAL_COMMUNITY)
Admission: EM | Admit: 2024-08-23 | Discharge: 2024-08-26 | Disposition: A | Discharge status: Home / Self Care | Attending: Infectious Diseases | Admitting: Infectious Diseases

## 2024-08-23 ENCOUNTER — Emergency Department (HOSPITAL_COMMUNITY)

## 2024-08-23 ENCOUNTER — Encounter (HOSPITAL_COMMUNITY): Payer: Self-pay

## 2024-08-23 ENCOUNTER — Other Ambulatory Visit: Payer: Self-pay | Admitting: Internal Medicine

## 2024-08-23 DIAGNOSIS — G894 Chronic pain syndrome: Secondary | ICD-10-CM

## 2024-08-23 DIAGNOSIS — B029 Zoster without complications: Secondary | ICD-10-CM | POA: Insufficient documentation

## 2024-08-23 DIAGNOSIS — F4321 Adjustment disorder with depressed mood: Secondary | ICD-10-CM | POA: Insufficient documentation

## 2024-08-23 DIAGNOSIS — Z96659 Presence of unspecified artificial knee joint: Secondary | ICD-10-CM | POA: Insufficient documentation

## 2024-08-23 DIAGNOSIS — W19XXXA Unspecified fall, initial encounter: Principal | ICD-10-CM | POA: Diagnosis present

## 2024-08-23 DIAGNOSIS — G8929 Other chronic pain: Secondary | ICD-10-CM | POA: Insufficient documentation

## 2024-08-23 DIAGNOSIS — D631 Anemia in chronic kidney disease: Secondary | ICD-10-CM | POA: Insufficient documentation

## 2024-08-23 DIAGNOSIS — E785 Hyperlipidemia, unspecified: Secondary | ICD-10-CM | POA: Insufficient documentation

## 2024-08-23 DIAGNOSIS — I82409 Acute embolism and thrombosis of unspecified deep veins of unspecified lower extremity: Secondary | ICD-10-CM | POA: Insufficient documentation

## 2024-08-23 DIAGNOSIS — N183 Chronic kidney disease, stage 3 unspecified: Secondary | ICD-10-CM | POA: Insufficient documentation

## 2024-08-23 DIAGNOSIS — M549 Dorsalgia, unspecified: Secondary | ICD-10-CM | POA: Insufficient documentation

## 2024-08-23 DIAGNOSIS — M5416 Radiculopathy, lumbar region: Secondary | ICD-10-CM | POA: Diagnosis not present

## 2024-08-23 DIAGNOSIS — I129 Hypertensive chronic kidney disease with stage 1 through stage 4 chronic kidney disease, or unspecified chronic kidney disease: Secondary | ICD-10-CM | POA: Insufficient documentation

## 2024-08-23 DIAGNOSIS — G800 Spastic quadriplegic cerebral palsy: Secondary | ICD-10-CM | POA: Insufficient documentation

## 2024-08-23 DIAGNOSIS — R234 Changes in skin texture: Secondary | ICD-10-CM | POA: Insufficient documentation

## 2024-08-23 DIAGNOSIS — R197 Diarrhea, unspecified: Secondary | ICD-10-CM | POA: Insufficient documentation

## 2024-08-23 LAB — COMPREHENSIVE METABOLIC PANEL WITH GFR
ALT: 19 U/L (ref 0–44)
AST: 23 U/L (ref 15–41)
Albumin: 3.7 g/dL (ref 3.5–5.0)
Alkaline Phosphatase: 54 U/L (ref 38–126)
Anion gap: 11 (ref 5–15)
BUN: 28 mg/dL — ABNORMAL HIGH (ref 8–23)
CO2: 21 mmol/L — ABNORMAL LOW (ref 22–32)
Calcium: 9.2 mg/dL (ref 8.9–10.3)
Chloride: 105 mmol/L (ref 98–111)
Creatinine, Ser: 0.93 mg/dL (ref 0.44–1.00)
GFR, Estimated: >60 mL/min (ref >60)
Glucose, Bld: 96 mg/dL (ref 70–99)
Potassium: 3.8 mmol/L (ref 3.5–5.1)
Sodium: 137 mmol/L (ref 135–145)
Total Bilirubin: 1 mg/dL (ref 0.0–1.2)
Total Protein: 6.7 g/dL (ref 6.5–8.1)

## 2024-08-23 LAB — CBC WITH DIFFERENTIAL/PLATELET
Abs Immature Granulocytes: 0.07 K/uL (ref 0.00–0.07)
Basophils Absolute: 0 K/uL (ref 0.0–0.1)
Basophils Relative: 0 %
Eosinophils Absolute: 0.3 K/uL (ref 0.0–0.5)
Eosinophils Relative: 4 %
HCT: 40 % (ref 36.0–46.0)
Hemoglobin: 13.1 g/dL (ref 12.0–15.0)
Immature Granulocytes: 1 %
Lymphocytes Relative: 17 %
Lymphs Abs: 1.4 K/uL (ref 0.7–4.0)
MCH: 29.8 pg (ref 26.0–34.0)
MCHC: 32.8 g/dL (ref 30.0–36.0)
MCV: 91.1 fL (ref 80.0–100.0)
Monocytes Absolute: 0.5 K/uL (ref 0.1–1.0)
Monocytes Relative: 6 %
Neutro Abs: 6 K/uL (ref 1.7–7.7)
Neutrophils Relative %: 72 %
Platelets: 227 K/uL (ref 150–400)
RBC: 4.39 MIL/uL (ref 3.87–5.11)
RDW: 15.4 % (ref 11.5–15.5)
WBC: 8.4 K/uL (ref 4.0–10.5)
nRBC: 0 % (ref 0.0–0.2)

## 2024-08-23 LAB — CK: Total CK: 38 U/L (ref 38–234)

## 2024-08-23 MED ORDER — FENTANYL CITRATE (PF) 50 MCG/ML IJ SOSY
25.0000 ug | PREFILLED_SYRINGE | Freq: Once | INTRAMUSCULAR | Status: AC
  Administered 2024-08-23: 25 ug via INTRAVENOUS
  Filled 2024-08-23: qty 1

## 2024-08-23 MED ORDER — FERROUS SULFATE 325 (65 FE) MG PO TABS
325.0000 mg | ORAL_TABLET | Freq: Every day | ORAL | 0 refills | Status: DC
  Filled 2024-08-23: qty 30, 30d supply, fill #0

## 2024-08-23 MED ORDER — CYCLOBENZAPRINE HCL 10 MG PO TABS
5.0000 mg | ORAL_TABLET | Freq: Once | ORAL | Status: AC
  Administered 2024-08-23: 5 mg via ORAL
  Filled 2024-08-23: qty 1

## 2024-08-23 MED ORDER — ARTIFICIAL TEARS OPHTHALMIC OINT
TOPICAL_OINTMENT | Freq: Three times a day (TID) | OPHTHALMIC | 0 refills | Status: AC | PRN
  Filled 2024-08-23: qty 3.5, fill #0

## 2024-08-23 MED ORDER — BISACODYL 10 MG RE SUPP
10.0000 mg | Freq: Every day | RECTAL | 0 refills | Status: DC | PRN
  Filled 2024-08-23: qty 12, 12d supply, fill #0

## 2024-08-23 MED ORDER — TRAZODONE HCL 50 MG PO TABS
50.0000 mg | ORAL_TABLET | Freq: Every evening | ORAL | 0 refills | Status: AC | PRN
  Filled 2024-08-23: qty 10, 10d supply, fill #0

## 2024-08-23 MED ORDER — HYDROCERIN EX CREA
1.0000 | TOPICAL_CREAM | Freq: Two times a day (BID) | CUTANEOUS | 0 refills | Status: DC | PRN
  Filled 2024-08-23: qty 113, 14d supply, fill #0

## 2024-08-23 MED ORDER — DANTROLENE SODIUM 100 MG PO CAPS
100.0000 mg | ORAL_CAPSULE | Freq: Three times a day (TID) | ORAL | 0 refills | Status: AC
  Filled 2024-08-23: qty 90, 30d supply, fill #0

## 2024-08-23 MED ORDER — GERHARDT'S BUTT CREAM
1.0000 | TOPICAL_CREAM | Freq: Two times a day (BID) | CUTANEOUS | 0 refills | Status: DC
  Filled 2024-08-23: qty 60, 30d supply, fill #0

## 2024-08-23 MED ORDER — ACETAMINOPHEN 325 MG PO TABS: 325.0000 mg | ORAL_TABLET | ORAL | Status: DC | PRN

## 2024-08-23 MED ORDER — HYDROCODONE-ACETAMINOPHEN 5-325 MG PO TABS: 1.0000 | ORAL_TABLET | Freq: Four times a day (QID) | ORAL | 0 refills | Status: DC | PRN

## 2024-08-23 MED ORDER — RIVAROXABAN 10 MG PO TABS
10.0000 mg | ORAL_TABLET | Freq: Every day | ORAL | 0 refills | Status: AC
Start: 2024-08-23 — End: ?
  Filled 2024-08-23: qty 30, 30d supply, fill #0

## 2024-08-23 MED ORDER — CYCLOBENZAPRINE HCL 5 MG PO TABS
5.0000 mg | ORAL_TABLET | Freq: Three times a day (TID) | ORAL | 0 refills | Status: AC
  Filled 2024-08-23: qty 30, 10d supply, fill #0

## 2024-08-23 NOTE — Telephone Encounter (Signed)
 Copied from CRM #[id number]. Topic: Clinical - Medication Refill >> Aug 15, 2024  8:36 AM Alfonso ORN wrote: Medication: HYDROcodone -acetaminophen  (NORCO/VICODIN) 5-325 MG per tablet 1 tablet     Has the patient contacted their pharmacy? No (Agent: If no, request that the patient contact the pharmacy for the refill. If patient does not wish to contact the pharmacy document the reason why and proceed with request.) (Agent: If yes, when and what did the pharmacy advise?)  This is the patient's preferred pharmacy:  [id number] - [place], [place] - [address] AT [facility] OF [name]  [place] [address] Phone: [phone] Fax: [phone]  Is this the correct pharmacy for this prescription? Yes If no, delete pharmacy and type the correct one.   Has the prescription been filled recently? No 07/19/24  Is the patient out of the medication? Yes  Has the patient been seen for an appointment in the last year OR does the patient have an upcoming appointment? yes  Can we respond through MyChart? No  Agent: Please be advised that Rx refills may take up to 3 business days. We ask that you follow-up with your pharmacy. >> Aug 23, 2024 12:55 PM Miquel SAILOR wrote: HYDROcodone -acetaminophen  (NORCO/VICODIN) 5-325 MG per tablet 1 tablet -PT is out of the hospital and PCP can fill medication. Needs call back when updated [phone]

## 2024-08-23 NOTE — Progress Notes (Signed)
 Inpatient Rehabilitation Discharge Medication Review by a Pharmacist  A complete drug regimen review was completed for this patient to identify any potential clinically significant medication issues.   High Risk Drug Classes Is patient taking? Indication by Medication  Antipsychotic No   Anticoagulant Yes Xarelto-VTE prophylaxis  Antibiotic No    Opioid Yes Norco-pain  Antiplatelet No    Hypoglycemics/insulin No    Vasoactive Medication No   Chemotherapy No    Other Yes Acetaminophen -pain Alendronate  - osteoporosis  Atorvastatin -HLD  Bisacodyl -constipation Ferrous Sulfate- anemia Flexeril -muscle spasms Dantrolene-spasticity Loperamide  - h/o chronic diarrhea Trazodone-sleep        Type of Medication Issue Identified Description of Issue Recommendation(s)  Drug Interaction(s) (clinically significant)        Duplicate Therapy        Allergy        No Medication Administration End Date        Incorrect Dose        Additional Drug Therapy Needed   Maxzide  on hold during acute admit Discharge instructions to Wait to take this until your doctor or other care provider tells you to start again.  Significant med changes from prior encounter (inform family/care partners about these prior to discharge).    Other            Clinically significant medication issues were identified that warrant physician communication and completion of prescribed/recommended actions by midnight of the next day:  No   Name of provider notified for urgent issues identified:    Provider Method of Notification:       Pharmacist comments:    Time spent performing this drug regimen review (minutes): 15  Levorn Gaskins, [place] Clinical Pharmacist 08/23/2024 9:55 AM

## 2024-08-23 NOTE — H&P (Incomplete)
 Date: 08/23/2024               Patient Name:  Kathy Owens MRN: [id number]  DOB: [birth year] Age / Sex: 68 y.o., female   PCP: Trudy Mliss Dragon, MD         Medical Service: Internal Medicine Teaching Service         Attending Physician: {[email or web address]}      First Contact: {InternName25/[id number]}}    Second Contact: {ResidentName25/26:[id number]}         Pager Information: First Contact Pager: [phone]   Second Contact Pager: [phone]   SUBJECTIVE   Chief Complaint: ***  History of Present Illness: Kathy Owens is a 68 y.o. female with PMH of ***.   Presents with *** Endorses *** Denies ***  *** ED Course: Labs significant for *** Imaging *** Received *** Consulted ***  Meds:  Patient reported: ***  No outpatient medications have been marked as taking for the 08/23/24 encounter [facility] Encounter).    Past Medical History ***  Past Surgical History Past Surgical History:  Procedure Laterality Date   JOINT REPLACEMENT Right      Social:  Lives With: Occupation: Support: Level of Function: PCP: *** Trudy Mliss Dragon, MD  Substances: -Tobacco: *** -Alcohol: *** -Recreational Drug: ***  Family History: *** Family History  Problem Relation Age of Onset   Heart failure Father        Died at the age of 5.  Was a smoker.   Alcohol abuse Sister        She is 10 years younger than Ms. Feehan     Allergies: Allergies as of 08/23/2024 - Review Complete 08/23/2024  Allergen Reaction Noted   Firvanq  [vancomycin ] Hives 11/06/2018   Ms contin [morphine] Other (See Comments) 07/10/2015   Lioresal  [baclofen ] Other (See Comments) 07/24/2024   Nsaids Other (See Comments) 07/24/2024   Zanaflex  [tizanidine ] Other (See Comments) 07/24/2024    Review of Systems: A complete ROS was negative except as per HPI.   OBJECTIVE:   Physical Exam: Blood pressure (!) 149/83, pulse 71, temperature 97.6 F (36.4 C), temperature source Oral,  resp. rate 18, height 5' (1.524 m), weight 71.2 kg, SpO2 100%.  Constitutional: well-appearing *** sitting in ***, in no acute distress HENT: normocephalic atraumatic, mucous membranes moist Eyes: conjunctiva non-erythematous Neck: supple Cardiovascular: regular rate and rhythm, no m/r/g Pulmonary/Chest: normal work of breathing on room air, lungs clear to auscultation bilaterally Abdominal: soft, non-tender, non-distended MSK: normal bulk and tone Neurological: alert & oriented x 3, 5/5 strength in bilateral upper and lower extremities, normal gait Skin: warm and dry Psych: ***  Labs: CBC    Component Value Date/Time   WBC 8.4 08/23/2024 2158   RBC 4.39 08/23/2024 2158   HGB 13.1 08/23/2024 2158   HGB 13.8 05/26/2019 1120   HCT 40.0 08/23/2024 2158   HCT 43.0 05/26/2019 1120   PLT 227 08/23/2024 2158   PLT 270 05/26/2019 1120   MCV 91.1 08/23/2024 2158   MCV 91 05/26/2019 1120   MCH 29.8 08/23/2024 2158   MCHC 32.8 08/23/2024 2158   RDW 15.4 08/23/2024 2158   RDW 13.6 05/26/2019 1120   LYMPHSABS 1.4 08/23/2024 2158   LYMPHSABS 1.3 05/26/2019 1120   MONOABS 0.5 08/23/2024 2158   EOSABS 0.3 08/23/2024 2158   EOSABS 0.1 05/26/2019 1120   BASOSABS 0.0 08/23/2024 2158   BASOSABS 0.0 05/26/2019 1120     CMP     Component Value Date/Time  NA 139 08/22/2024 0433   NA 143 10/28/2023 1417   K 3.9 08/22/2024 0433   CL 106 08/22/2024 0433   CO2 20 (L) 08/22/2024 0433   GLUCOSE 94 08/22/2024 0433   BUN 32 (H) 08/22/2024 0433   BUN 35 (H) 10/28/2023 1417   CREATININE 0.88 08/22/2024 0433   CREATININE 1.12 (H) 05/10/2015 1157   CALCIUM  8.6 (L) 08/22/2024 0433   PROT 5.8 (L) 08/19/2024 0504   PROT 7.0 07/26/2020 0934   ALBUMIN 3.1 (L) 08/19/2024 0504   ALBUMIN 4.7 07/26/2020 0934   AST 18 08/19/2024 0504   ALT 17 08/19/2024 0504   ALKPHOS 46 08/19/2024 0504   BILITOT 0.9 08/19/2024 0504   BILITOT 0.5 07/26/2020 0934   GFRNONAA >60 08/22/2024 0433   GFRNONAA 54 (L)  05/10/2015 1157   GFRAA 73 07/26/2020 0934   GFRAA 62 05/10/2015 1157    Imaging: *** CT PELVIS WO CONTRAST Result Date: 08/23/2024 EXAM: CT Pelvis, Without IV Contrast 08/23/2024 10:11:49 PM TECHNIQUE: Axial images were acquired through the pelvis without IV contrast. Reformatted images were reviewed. Automated exposure control, iterative reconstruction, and/or weight based adjustment of the mA/kV was utilized to reduce the radiation dose to as low as reasonably achievable. COMPARISON: CT 08/05/2024 CLINICAL HISTORY: Hip trauma, fracture suspected, no prior imaging. FINDINGS: BONES: Right hip replacement with artifact. Mild-to-moderate degenerative changes of the left hip. No acute fracture or focal osseous lesion. JOINTS: No dislocation. The joint spaces are normal. No significant hip effusion. SOFT TISSUES: Mild nonspecific right greater than left stranding within the subcutaneous soft tissues overlying the hips. INTRAPELVIC CONTENTS: Aortic atherosclerosis. Limited images of the intrapelvic contents demonstrate no acute abnormality. IMPRESSION: 1. No acute osseous abnormality 2. Right hip arthroplasty with associated artifact. 3. Mild-to-moderate degenerative changes of the left hip. 4. Mild nonspecific subcutaneous soft tissue stranding overlying the hips, greater on the right. Electronically signed by: Luke Bun MD 08/23/2024 10:31 PM EST RP Workstation: [id number]   CT Lumbar Spine Wo Contrast Result Date: 08/23/2024 EXAM: CT OF THE LUMBAR SPINE WITHOUT CONTRAST 08/23/2024 10:11:49 PM TECHNIQUE: CT of the lumbar spine was performed without the administration of intravenous contrast. Multiplanar reformatted images are provided for review. Automated exposure control, iterative reconstruction, and/or weight based adjustment of the mA/kV was utilized to reduce the radiation dose to as low as reasonably achievable. COMPARISON: CT 08/05/2024, CT 10/28/2018, MRI of 07/29/2023. CLINICAL HISTORY: Ataxia,  lumbar trauma. FINDINGS: BONES AND ALIGNMENT: Normal vertebral body heights. No acute fracture or suspicious bone lesion. Normal alignment. DEGENERATIVE CHANGES: At T12-L1, the disc space is patent. There are mild facet degenerative changes. No canal stenosis or foraminal narrowing is present. At L1-L2, there is mild disc space narrowing. Diffuse disc bulge with facet degenerative changes and ligamentum flavum thickening. Resultant severe canal stenosis. Patent foramen. At L2-L3, patent disc space. Diffuse disc bulge and ligamentum flavum thickening. Mild canal stenosis. No high-grade foraminal narrowing. At L3-L4, mild disc space narrowing. Diffuse disc bulge and ligamentum flavum thickening. Moderate facet degenerative changes. Moderate canal stenosis. No high-grade bony foraminal narrowing. Right lateral disc protrusion. At L4-L5, patent disc space. Moderate facet degenerative changes and ligamentum flavum thickening. Mild-to-moderate canal stenosis. At L5-S1, patent disc space. No canal stenosis. Patent foramen. Mild left foraminal narrowing. SOFT TISSUES: 13 mm slightly dense exophytic lesion off the left kidney but stable since 2010 and therefore likely benign, no specific imaging follow-up is recommended. Calcified gallstones. No acute abnormality. IMPRESSION: 1. No acute osseous abnormality of the lumbar  spine. 2. Multilevel degenerative changes as described above with Severe canal stenosis at L1-L2, moderate canal stenosis at L3-L4 with right lateral disc protrusion as previously described . Electronically signed by: Luke Bun MD 08/23/2024 10:26 PM EST RP Workstation: [id number]     EKG: personally reviewed my interpretation is***. Prior EKG***  ASSESSMENT & PLAN:   Assessment & Plan by Problem: Active Problems:   * No active hospital problems. *   Kathy Owens is a 68 y.o. person living with a history of *** who presented with *** and admitted for *** on hospital day  0  *** ***  *** ***  *** ***  Best practice: Diet: {NAMES:3044014::Normal,Heart Healthy,Carb-Modified,Renal,Carb/Renal,NPO,TPN,Tube Feeds} VTE: {NAMES:3044014::Heparin,Enoxaparin ,SCDs,DOAC,None} IVF: {NAMES:3044014::None,NS,1/2 NS,LR,D5,D10},{NAMES:3044014::None,10cc/hr,25cc/hr,50cc/hr,75cc/hr,100cc/hr,110cc/hr,125cc/hr,Bolus} Code: {NAMES:3044014::Full,DNR,DNI,DNR/DNI,Comfort Care,Unknown}  Disposition planning: Prior to Admission Living Arrangement: {NAMES:3044014::Home, living ***,SNF, ***,Homeless,***} Anticipated Discharge Location: {NAMES:3044014::Home,SNF,CIR,***}  Dispo: Admit patient to {STATUS:3044014::Observation with expected length of stay less than 2 midnights.,Inpatient with expected length of stay greater than 2 midnights.}  Signed: Bernadine Manos, MD Internal Medicine Resident  08/23/2024, 10:58 PM  On Call pager: [phone]

## 2024-08-23 NOTE — Progress Notes (Signed)
 PROGRESS NOTE   Subjective/Complaints:  Pt worried will have to go to drug store to pick up meds, but is going home by ambulance- explained we can probably get her meds here at hospital before leaves.   Got IV- per chart, is full assist to feed her- pt can feed herself- fixed this.    Ready to go home- still refuses back surgery- and doesn't want to stay longer.     ROS: per HPI   Pt denies SOB, abd pain, CP, N/V/C/D, and vision changes   No significant change in spasticity- still severe (+)   Objective:   No results found.  Recent Labs    08/22/24 0433  WBC 5.8  HGB 11.2*  HCT 34.7*  PLT 184    Recent Labs    08/22/24 0433  NA 139  K 3.9  CL 106  CO2 20*  GLUCOSE 94  BUN 32*  CREATININE 0.88  CALCIUM  8.6*    Intake/Output Summary (Last 24 hours) at 08/23/2024 0806 Last data filed at 08/23/2024 0620 Gross per 24 hour  Intake 920 ml  Output --  Net 920 ml        Physical Exam: Vital Signs Blood pressure 128/69, pulse 73, temperature 98.2 F (36.8 C), temperature source Oral, resp. rate 17, height 5' (1.524 m), weight 71.6 kg, SpO2 96%.      General: awake, alert, appropriate, sitting up in bed; NAD HENT: conjugate gaze; oropharynx moist CV: regular rate and rhythm; no JVD Pulmonary: CTA B/L; no W/R/R- good air movement GI: soft, NT, ND, (+)BS Psychiatric: anxious but trying to hard with jokes Neurological: Ox3 but has some magical thinking regarding her improvement? Unrealistic expectation  MAS of 3-4 in knees, but still MAS of 4 in hips and ankles.  Cannot tell if dystonia due to severity  Ext: no clubbing, cyanosis, or edema Psych: pleasant and cooperative  Skin: Left V1 rash--resolved  Neurological: Alert and oriented x 3. Normal insight and awareness. Intact Memory. Normal language, slight dysarthria. Cranial nerve exam unremarkable.     UE strength RUE- 4-4+ proximally, Grip  4-/5, FA 3-/5 LUE- Deltoid, biceps, triceps 4/5- WE 4-/5; grip 3+/5 and FA 2/5 RLE-tone 3 prox to 3 to 4 distally LLE- tone- proximally -->dis  3+--> 3+ to 4  Physical exam unchanged from the above on reexamination 08/23/24     Assessment/Plan: 1. Functional deficits which require 3+ hours per day of interdisciplinary therapy in a comprehensive inpatient rehab setting. Physiatrist is providing close team supervision and 24 hour management of active medical problems listed below. Physiatrist and rehab team continue to assess barriers to discharge/monitor patient progress toward functional and medical goals  Care Tool:  Bathing    Body parts bathed by patient: Face, Chest, Abdomen, Right arm, Left arm, Right upper leg, Left upper leg   Body parts bathed by helper: Right lower leg, Left lower leg, Front perineal area, Buttocks     Bathing assist Assist Level: Moderate Assistance - Patient 50 - 74%     Upper Body Dressing/Undressing Upper body dressing   What is the patient wearing?: Pull over shirt    Upper body assist Assist Level:  Minimal Assistance - Patient > 75%    Lower Body Dressing/Undressing Lower body dressing      What is the patient wearing?: Pants, Incontinence brief     Lower body assist Assist for lower body dressing: Total Assistance - Patient < 25%     Toileting Toileting    Toileting assist Assist for toileting: Dependent - Patient 0%     Transfers Chair/bed transfer  Transfers assist     Chair/bed transfer assist level: 2 Helpers     Locomotion Ambulation   Ambulation assist      Assist level: 2 helpers Assistive device: Walker-rolling Max distance: 29   Walk 10 feet activity   Assist  Walk 10 feet activity did not occur: Safety/medical concerns  Assist level: 2 helpers Assistive device: Walker-rolling   Walk 50 feet activity   Assist Walk 50 feet with 2 turns activity did not occur: Safety/medical concerns          Walk 150 feet activity   Assist Walk 150 feet activity did not occur: Safety/medical concerns         Walk 10 feet on uneven surface  activity   Assist Walk 10 feet on uneven surfaces activity did not occur: Safety/medical concerns         Wheelchair     Assist Is the patient using a wheelchair?: Yes Type of Wheelchair: Power    Wheelchair assist level: Supervision/Verbal cueing Max wheelchair distance: 300+    Wheelchair 50 feet with 2 turns activity    Assist        Assist Level: Supervision/Verbal cueing   Wheelchair 150 feet activity     Assist      Assist Level: Supervision/Verbal cueing   Blood pressure 128/69, pulse 73, temperature 98.2 F (36.8 C), temperature source Oral, resp. rate 17, height 5' (1.524 m), weight 71.6 kg, SpO2 96%.  Medical Problem List and Plan: 1. Functional deficits secondary to cerebral palsy/ massive increase in spasticity             -patient may  shower             -ELOS/Goals: 3-4 weeks- min-mod A hopefully            - w/c evaluation while here  Con't CIR PT and OT  Sister not coming in for family training- concerned about pt's d/c, however pt AND sister are insistent to go home tomorrow.   D/c 11/4  D/w pt after her request that back surgery would be the only way and not 100% that could help her legs get stronger due to severe spinal stenosis. Still willing to go home with power w/c  D/C today by ambulance at 11am   Will meet criteria for power w/c- due to her dx of Diplegic Cerebral palsy as well as Severe lumbar stenosis/myelopathy, causing severe LE weakness and associated SEVERE LE spasticity- needs power w/c, so can transfer downhill every time- needs elevater function for this- needs recline and tilt and space due to bladder; and to reduce risk of pressure ulcers.    2.  Antithrombotics: -DVT/anticoagulation:  Pharmaceutical: Xarelto             -antiplatelet therapy: n/a   3. Pain Management:  Hx of chronic pain d/t cerebral palsy-continue on home hydrocodone -acetaminophen  5-3 25 as needed  - 11/1 patient on Norco 5 from outpatient provider, patient says that she called her outpatient provider, per patient report it sounds like outpatient provider plans to wait  to refill until closer to discharge in case dose was can be changed.  Asked if she is on a pain agreement she and patient reported she did have this.  I recommended she contact the office early next week for discharge.  -11/2 reports pain overall under control  11/4- will send home on 5 days of Norco so can get from PCP 4. Mood/Behavior/Sleep: LCSW to follow for evaluation and support when available.              -antipsychotic agents: n/a   10/30- will d/w pt about her mood- and maybe adding SSRI  5. Neuropsych/cognition: This patient is capable of making decisions on her own behalf.   6. Skin/Wound Care: routine pressure relief measures.             -monitor lesion over V1 distribution on left-continue Eucerin    10/20- will con't Rewetting drops frequently for L eye- due to dryness  10/30- healed, but c/o dry L eye and swelling- will have NP assess   10/31- added lacrilube for L eye- gritty feeling- and dryness- eye not red this AM 7. Fluids/Electrolytes/Nutrition: Monitor I&O with f/u labs in a.m.              -regular diet + Ensure, eating well   - 10-25: Electrolytes, magnesium within normal limits.  Patient asked multiple times about magnesium repletion, reassured her that it is in her normal range.   8. Muscle spacticity/CP: Resumed dantrolene 25 mg BID on 10/10. Flexeril  5 mg TID              - Monitor CBC and CMP             Will titrate dantrolene up tomorrow to TID  10/17- increased Dantrolene to 25 mg TID today- encouraged pt to take, since her spasticity is so severe.   10/18-19-- no significant change at this point. Increase dantrium to 25mg  qid. Pt is on board. Will need further titration if tolerated.    10/24 tolerating the dantrium increase to 50 TID. But I agree with her that there hasn't been dramatic change. Will increase to 50mg  qid today. We discussed other options as an outpt which could include botox and a baclofen  pump. Check LFT's Monday. 10-26: Tone not bothersome over this weekend, no gross spasms  10/27- Tone is down to 3-4 from a strong 4 prior- is looser- will wait to increase Dantrolene since only been 3 days since last increased.   10/29- increased Dantrolene to 100 mg TID- pt cannot tolerate Baclofen - caused hallucinations- could do Zanaflex , but concerned could drop BP- if Dantrolene not enough, could do low dose Botulinum toxin   10/30- Will check CMP in AM- to make sure liver is OK and Kidneys- still not drinking much  10/31- LFTs look great- 18 and 17- con't dantrolene  11/3- Will need f/u with Dr Emeline as well as myself for Botox 9.  Zoster/V1 distribution:  Completed 14-day course of Valtrex on 10/11 but would benefit from zoster VAX.  Outpatient follow-up with ophthalmology d/t elevated IOP on 9/27.             - Continue as needed eyedrops    10/23-24 eye drops helping, eye/skin looks much better . 11/3- added lacrilube last week- doing better 9. HLD: Lipitor 10 mg    10. Hx of Chronic Diarrhea:  monitor potassium and magnesium labs, continue Imodium  2 mg daily.     - 10/25: 5x liquid BM  overnight; add back immodium PRN as appears it was Dced-patient request this to be scheduled twice daily.-  No leukocytosis or fevers to indicate infectious cause.  10-26: No further bowel movements.  Reduce Imodium  to 2 mg daily  10/28- LBM 10/25- might need bowel intervention- will give her day then do Sorbitol   10/29- Small BM yesterday- needs Sorbitol -will try Miralax x1  10/31- tried sorbitol yesterday- had BM and needs to have one this AM  11/1 had large type IV bowel movement this morning, continue to monitor  11/2 BM today, continue to follow  11. Cause of  spasticity?  10/17- when I got approval to bring pt to CIR, the insurance peer to peer asked that I work up more what might be causing pt's increased spasticity- I ordered CT of Chest, abd and pelvis- and if that is not fruitful, will d/w Neurology if they have any ideas- Peer to peer wanted an LP. CT not resulted yet- does have severe canal stenosis at L1/2 but wouldn't explain her spasticity, just weakness. Per chart, pt refused surgery- will d/w pt again next Monday  10/18 CT of abdomen and pelvis really unremarkable. She has one 8mm nodule in RUL of lung with follow up recommended in ~6-12 mos  10/24 we spoke with neurology yesterday who had nothing further to offer. Recommend f/u with neuro as outpt as well as other interventions I mentioned above in #8  10/29- will increase Dantrolene to 100 mg TID- probably will need Botulinum toxin after d/c? Will d/w Dr Emeline 12. Azotemia     10/19 pt with hx of CKD III per chart.    -may not be too far from baseline   -we discussed pushing fluids today--labs tomorrow  10/22 have reassured pt that renal function was stable.    -she's drinking plenty   10/23 renal fxn stable  10-25: Renal function improving  10/27- BUN up to 39- will replete with IVFs today after therapy and recheck in AM.   10/28- BUN down to 31- started IVFs at 8pm last night- so needs 16 hours of fluids- also pt drinking 1-2 cups/water/day- we discussed at length about drinking 6-8 cups/day- every day  10/30- still back down to not drinking much- will recheck labs in AM  10/31- drinking better- BUN down to 24- happy with current regimen  11/1 reviewed CMP from yesterday, BUN down to 24 reiterated, patient has been working on drinking more water.  She asked me to open her water bottle for her.  Continue to encourage.  11/2 BMP tomorrow, she reports she is drinking more water  11/3- BMP shows BUN of 32- no better- I think this is her baseline, not drinking due to bladder incontinence.   12. Anemia  -drop to 10.7 today--no signs of blood loss, asymptomatic   10/24-recheck on Monday           -  B12 and Folate as well as iron panel reviewed and iron level borderline, TIBC low   -begin scheduled ferrous sulfate  10-26: Patient inquires about resumption of B12 supplementation.  Since within normal limits, defer to primary team.  -HGB stable 10.9 on 10/30  Recheck tomorrow   11/3- Hb 11.2!  Patient will need f/u with Dr Micole Delehanty for f/u on CP/spasticity-  as well as Dr Emeline for Boulinum toxin for LE's spasticity as well as pt to call Dr Mliss Pouch her PCP- to get refills on Norco- can get 5 days of pain  meds at d/c.   The patient is medically  ready but not functionally ready for discharge- she refuses to stay longer- no family training done- sister wouldn't couldn't come in-  to home and will need follow-up with [facility]. In addition, they will need to follow up with their PCP, And F/u with Dr Emeline for Botulinum Toxin for LE spasticity Can also f/u with Neurology to help determine any other cause for LE spasticity. .   I spent a total of 36   minutes on total care today- >50% coordination of care- due to  D/w pt about d/c as well as went over med-s including Norco and d/c plans- also d/w PA.    LOS: 19 days A FACE TO FACE EVALUATION WAS PERFORMED  Lateasha Breuer 08/23/2024, 8:06 AM

## 2024-08-23 NOTE — Telephone Encounter (Signed)
 Refill information sent to Dr. Trudy.                                       Copied from CRM #[id number]. Topic: Clinical - Medication Refill >> Aug 15, 2024  8:36 AM Alfonso ORN wrote: Medication: HYDROcodone -acetaminophen  (NORCO/VICODIN) 5-325 MG per tablet 1 tablet     Has the patient contacted their pharmacy? No (Agent: If no, request that the patient contact the pharmacy for the refill. If patient does not wish to contact the pharmacy document the reason why and proceed with request.) (Agent: If yes, when and what did the pharmacy advise?)  This is the patient's preferred pharmacy:  [id number] - [place], [place] - [address] AT [facility] OF [name]  [place] [address] Phone: [phone] Fax: [phone]  Is this the correct pharmacy for this prescription? Yes If no, delete pharmacy and type the correct one.   Has the prescription been filled recently? No 07/19/24  Is the patient out of the medication? Yes  Has the patient been seen for an appointment in the last year OR does the patient have an upcoming appointment? yes  Can we respond through MyChart? No  Agent: Please be advised that Rx refills may take up to 3 business days. We ask that you follow-up with your pharmacy. >> Aug 23, 2024 12:55 PM Miquel SAILOR wrote: HYDROcodone -acetaminophen  (NORCO/VICODIN) 5-325 MG per tablet 1 tablet -PT is out of the hospital and PCP can fill medication. Needs call back when updated [phone]

## 2024-08-23 NOTE — Progress Notes (Signed)
 Inpatient Rehabilitation Care Coordinator Discharge Note   Patient Details  Name: Kathy Owens MRN: [id number] Date of Birth: [birth year]   Discharge location: D/c to home  Length of Stay: 18 days  Discharge activity level: wheelchair level Supervision  Home/community participation: Limited  Patient response un:Yzjouy Literacy - How often do you need to have someone help you when you read instructions, pamphlets, or other written material from your doctor or pharmacy?: Rarely  Patient response un:Dnrpjo Isolation - How often do you feel lonely or isolated from those around you?: Rarely  Services provided included: MD, RD, PT, OT, SLP, RN, CM, Pharmacy, Neuropsych, SW, TR  Financial Services:  Field Seismologist Utilized: Private Insurance [facility] Medicare  Choices offered to/list presented to: patient  Follow-up services arranged:  Home Health, DME, Other (Comment), Patient/Family has no preference for HH/DME agencies (PCS and CAP/DA app referrals submitted) Home Health Agency: [facility] for HHPT/OT/aide    DME : [employer] for specialty w/c    Patient response to transportation need: Is the patient able to respond to transportation needs?: Yes In the past 12 months, has lack of transportation kept you from medical appointments or from getting medications?: No In the past 12 months, has lack of transportation kept you from meetings, work, or from getting things needed for daily living?: No   Patient/Family verbalized understanding of follow-up arrangements:  Yes  Individual responsible for coordination of the follow-up plan: contact pt  Confirmed correct DME delivered: Graeme DELENA Owens 08/23/2024    Comments (or additional information):No fam edu   Summary of Stay    Date/Time Discharge Planning CSW  08/15/24 1424 Pt to d/c to home with alone; sister will help at this time as she lives in her home. W/c eval on Tuesday with [employer]. Family edu on Wed 1pm-4pm. SW  will confirm there are no barriers to discharge. AAC  08/08/24 1417 Pt to d/c to home with alone; sister will help at this time as she lives in her home. SW will confirm there are no barriers to discharge AAC       Kathy Owens

## 2024-08-23 NOTE — Progress Notes (Signed)
 Physical Therapy Session Note  Patient Details  Name: Kathy Owens MRN: [id number] Date of Birth: 03-14-1956  Today's Date: 08/22/2024 PT Individual Time: 1521-1605 PT Individual Time Calculation (min):  44 min  Short Term Goals: Week 1:  PT Short Term Goal 1 (Week 1): pt will initiate standing with RW PT Short Term Goal 1 - Progress (Week 1): Met PT Short Term Goal 2 (Week 1): Pt will initiate SPT PT Short Term Goal 2 - Progress (Week 1): Not met PT Short Term Goal 3 (Week 1): Pt will tolerate standing >5 min for tone management PT Short Term Goal 3 - Progress (Week 1): Met Week 2:  PT Short Term Goal 1 (Week 2): pt will initiate SPT training PT Short Term Goal 2 (Week 2): Pt will navigate PWC >5 min without need for cueing PT Short Term Goal 3 (Week 2): pt will perform bed mobiltiy with assist of 1   Skilled Therapeutic Interventions/Progress Updates:  Patient seated upright with reclined back support in pwc on entrance to room. Patient alert and agreeable to PT session.   Patient with no pain complaint at start of session. Does relate that she is very fatigued from day.   Therapeutic Activity: Transfers: Pt performed sit<>stand and stand pivot transfer from w/c to mat table to L side with use of RW and requires Mod/ MaxA to sequence and then produce step progression with RLE stepping fwd and LLE stepping bkwd. Provided vc/ tc for required movements. She requires extensive tim to complete.One LOB prevented posteriorly with MaxA from therapist.  Slide board transfer initiated back to w/c from mat table. TotA to place board, then pt requires significant cueing along with up to Mod/ MaxA to initiate and complete. Also requires physicl assist to maintain feet to floor for support as well as progression/ repositioning of feet. Requires many efforts to reach seat.   Wheelchair Mobility:  Pt maneuvered power wheelchair throughout department from room to dayrooom with overall supervisory  assist.  Maneuvers crowded hallway on exit/re-entrance of room ~150 feet each direction with vc only required for bringing attention to direction/ path/ obstacles for only 25% of total mobility.   Patient seated upright with back tilt in pwc at end of session with brakes locked, belt alarm set, and all needs within reach.   Therapy Documentation Precautions:  Precautions Precautions: Fall Recall of Precautions/Restrictions: Intact Precaution/Restrictions Comments: hx of CP with spasticity Restrictions Weight Bearing Restrictions Per Provider Order: No  Pain:  No pain related this session. Pt's main complaint is fatigue.   Therapy/Group: Individual Therapy  Mliss DELENA Milliner PT, DPT, CSRS 08/23/2024, 3:59 AM

## 2024-08-23 NOTE — Plan of Care (Signed)
  Problem: Consults Goal: RH GENERAL PATIENT EDUCATION Description: See Patient Education module for education specifics. Outcome: Progressing   Problem: RH BOWEL ELIMINATION Goal: RH STG MANAGE BOWEL WITH ASSISTANCE Description: STG Manage Bowel with min- mod Assistance. Outcome: Progressing   Problem: RH BLADDER ELIMINATION Goal: RH STG MANAGE BLADDER WITH ASSISTANCE Description: STG Manage Bladder With min-mod Assistance Outcome: Progressing   Problem: RH SKIN INTEGRITY Goal: RH STG SKIN FREE OF INFECTION/BREAKDOWN Description: Manage skin free of infection with min - mod assistance Outcome: Progressing   Problem: RH SAFETY Goal: RH STG ADHERE TO SAFETY PRECAUTIONS W/ASSISTANCE/DEVICE Description: STG Adhere to Safety Precautions With min- mod Assistance/Device. Outcome: Progressing   Problem: RH PAIN MANAGEMENT Goal: RH STG PAIN MANAGED AT OR BELOW PT'S PAIN GOAL Description: < 4 w/ prns Outcome: Progressing   Problem: RH KNOWLEDGE DEFICIT GENERAL Goal: RH STG INCREASE KNOWLEDGE OF SELF CARE AFTER HOSPITALIZATION Description: Manage increase knowledge of self after hospitalization with min- mod assistance from sister using educational materials provided Outcome: Progressing   Problem: Education: Goal: Knowledge of General Education information will improve Description: Including pain rating scale, medication(s)/side effects and non-pharmacologic comfort measures Outcome: Progressing   Problem: Health Behavior/Discharge Planning: Goal: Ability to manage health-related needs will improve Outcome: Progressing   Problem: Clinical Measurements: Goal: Ability to maintain clinical measurements within normal limits will improve Outcome: Progressing Goal: Will remain free from infection Outcome: Progressing Goal: Diagnostic test results will improve Outcome: Progressing Goal: Respiratory complications will improve Outcome: Progressing Goal: Cardiovascular complication  will be avoided Outcome: Progressing   Problem: Activity: Goal: Risk for activity intolerance will decrease Outcome: Progressing   Problem: Nutrition: Goal: Adequate nutrition will be maintained Outcome: Progressing   Problem: Coping: Goal: Level of anxiety will decrease Outcome: Progressing   Problem: Elimination: Goal: Will not experience complications related to bowel motility Outcome: Progressing Goal: Will not experience complications related to urinary retention Outcome: Progressing   Problem: Pain Managment: Goal: General experience of comfort will improve and/or be controlled Outcome: Progressing   Problem: Safety: Goal: Ability to remain free from injury will improve Outcome: Progressing   Problem: Skin Integrity: Goal: Risk for impaired skin integrity will decrease Outcome: Progressing

## 2024-08-23 NOTE — ED Provider Notes (Signed)
 [facility] Provider Note   CSN: [id number] Arrival date & time: 08/23/24  2051     Patient presents with: Fall   Samayah K Drees is a 68 y.o. female history of cerebral palsy with muscle spasticity, shingles, lumbar radiculopathy here presenting with fall and inability to walk.  Patient was admitted to the hospital after a fall and eventually went to rehab and was discharged from rehab today.  Patient went home and patient was in her wheelchair and slipped off the wheelchair and landed on her back.  She states that she was unable to get up at that time.  EMS was called and patient has significant bilateral leg weakness and was brought back to the ER.  Patient denies any head injury or loss of consciousness   The history is provided by the patient.       Prior to Admission medications   Medication Sig Start Date End Date Taking? Authorizing Provider  acetaminophen  (TYLENOL ) 325 MG tablet Take 1-2 tablets (325-650 mg total) by mouth every 4 (four) hours as needed for mild pain (pain score 1-3). 08/23/24   Jerilynn Daphne SAILOR, NP  alendronate  (FOSAMAX ) 70 MG tablet Take 1 tablet (70 mg total) by mouth every 7 (seven) days. Take with a full glass of water on an empty stomach. Patient taking differently: Take 70 mg by mouth every Monday. 05/12/24 05/12/25  Trudy Mliss Dragon, MD  artificial tears (LACRILUBE) OINT ophthalmic ointment Place into the left eye 3 (three) times daily as needed for dry eyes. 08/23/24   Jerilynn Daphne SAILOR, NP  atorvastatin  (LIPITOR) 10 MG tablet TAKE 1 TABLET(10 MG) BY MOUTH DAILY Patient taking differently: Take 10 mg by mouth daily after supper. 04/01/24   Trudy Mliss Dragon, MD  bisacodyl (DULCOLAX) 10 MG suppository Place 1 suppository (10 mg total) rectally daily as needed for moderate constipation. 08/23/24   Jerilynn Daphne SAILOR, NP  cyclobenzaprine  (FLEXERIL ) 5 MG tablet Take 1 tablet (5 mg total) by mouth 3 (three)  times daily. 08/23/24   Jerilynn Daphne SAILOR, NP  dantrolene (DANTRIUM) 100 MG capsule Take 1 capsule (100 mg total) by mouth 3 (three) times daily. 08/23/24   Jerilynn Daphne SAILOR, NP  ferrous sulfate 325 (65 FE) MG tablet Take 1 tablet (325 mg total) by mouth daily with breakfast. 08/23/24   Jerilynn Daphne SAILOR, NP  hydrocerin (EUCERIN) CREA Apply 1 Application topically 2 (two) times daily as needed. 08/23/24   Jerilynn Daphne SAILOR, NP  HYDROcodone -acetaminophen  (NORCO/VICODIN) 5-325 MG tablet Take 1 tablet by mouth every 6 (six) hours as needed for moderate pain (pain score 4-6) or severe pain (pain score 7-10). 08/23/24 09/22/24  Trudy Mliss Dragon, MD  loperamide  (IMODIUM ) 2 MG capsule TAKE 1 CAPSULE(2 MG) BY MOUTH DAILY FOR UP TO 360 DOSES AS NEEDED FOR DIARRHEA OR LOOSE STOOLS Patient taking differently: Take 2 mg by mouth See admin instructions. Take 1 capsule (2mg ) by mouth every day and also 1 tablet daily as needed for loose stools. 03/31/24   Trudy Mliss Dragon, MD  Nystatin (GERHARDT'S BUTT CREAM) CREA Apply 1 Application topically 2 (two) times daily. 08/23/24   Jerilynn Daphne SAILOR, NP  rivaroxaban (XARELTO) 10 MG TABS tablet Take 1 tablet (10 mg total) by mouth daily. 08/23/24   Jerilynn Daphne SAILOR, NP  traZODone (DESYREL) 50 MG tablet Take 1 tablet (50 mg total) by mouth at bedtime as needed for sleep. 08/23/24   Jerilynn Daphne SAILOR, NP  triamterene -hydrochlorothiazide  (MAXZIDE -25) 37.5-25 MG tablet Take 1 tablet by mouth daily. 06/16/24   Trudy Mliss Dragon, MD    Allergies: Firvanq  Bevin ], Ms contin [morphine], Lioresal  [baclofen ], Nsaids, and Zanaflex  [tizanidine ]    Review of Systems  Musculoskeletal:  Positive for back pain.  All other systems reviewed and are negative.   Updated Vital Signs SpO2 99%   Physical Exam Vitals and nursing note reviewed.  Constitutional:      Appearance: Normal appearance.  HENT:     Head: Normocephalic.     Comments: No obvious scalp hematoma     Nose: Nose normal.     Mouth/Throat:     Mouth: Mucous membranes are moist.  Eyes:     Extraocular Movements: Extraocular movements intact.     Pupils: Pupils are equal, round, and reactive to light.  Cardiovascular:     Rate and Rhythm: Normal rate and regular rhythm.     Pulses: Normal pulses.     Heart sounds: Normal heart sounds.  Pulmonary:     Effort: Pulmonary effort is normal.     Breath sounds: Normal breath sounds.  Abdominal:     General: Abdomen is flat.     Palpations: Abdomen is soft.  Musculoskeletal:     Cervical back: Normal range of motion and neck supple.     Comments: Mild lower lumbar tenderness.  Patient has decreased range of motion bilateral hips but no obvious deformity  Skin:    General: Skin is warm.  Neurological:     General: No focal deficit present.     Mental Status: She is alert.     Comments: Patient's strength is 3 out of 5 bilateral legs.  Patient has normal reflexes bilateral knees  Psychiatric:        Mood and Affect: Mood normal.        Behavior: Behavior normal.     (all labs ordered are listed, but only abnormal results are displayed) Labs Reviewed  CBC WITH DIFFERENTIAL/PLATELET  CK  COMPREHENSIVE METABOLIC PANEL WITH GFR    EKG: None  Radiology: No results found.   Procedures   Medications Ordered in the ED  fentaNYL (SUBLIMAZE) injection 25 mcg (has no administration in time range)  cyclobenzaprine  (FLEXERIL ) tablet 5 mg (has no administration in time range)                                    Medical Decision Making Carlene K Ewing is a 68 y.o. female here presenting with back pain and inability to walk after a fall.  Patient has baseline cerebral palsy with muscle spasms and chronic pain.  Patient is already on pain medicine.  Patient also has lumbar radiculopathy from previous fall.  Consider worsening radiculopathy versus fracture.  Plan to get CT lumbar spine and CT pelvis.  Will give pain medicine and muscle  relaxant.   Amount and/or Complexity of Data Reviewed Labs: ordered. Radiology: ordered.  Risk Prescription drug management.     Final diagnoses:  None    ED Discharge Orders     None          Patt Alm Macho, MD 08/23/24 2311

## 2024-08-23 NOTE — Plan of Care (Signed)
  Problem: RH Balance Goal: LTG: Patient will maintain dynamic sitting balance (OT) Description: LTG:  Patient will maintain dynamic sitting balance with assistance during activities of daily living (OT) Outcome: Completed/Met Goal: LTG Patient will maintain dynamic standing with ADLs (OT) Description: LTG:  Patient will maintain dynamic standing balance with assist during activities of daily living (OT)  Outcome: Completed/Met   Problem: Sit to Stand Goal: LTG:  Patient will perform sit to stand in prep for activites of daily living with assistance level (OT) Description: LTG:  Patient will perform sit to stand in prep for activites of daily living with assistance level (OT) Outcome: Completed/Met Flowsheets (Taken 08/23/2024 0755) LTG: PT will perform sit to stand in prep for activites of daily living with assistance level: (mod A) Moderate Assistance - Patient 50 - 74%   Problem: RH Dressing Goal: LTG Patient will perform upper body dressing (OT) Description: LTG Patient will perform upper body dressing with assist, with/without cues (OT). Outcome: Completed/Met   Problem: RH Toilet Transfers Goal: LTG Patient will perform toilet transfers w/assist (OT) Description: LTG: Patient will perform toilet transfers with assist, with/without cues using equipment (OT) Outcome: Completed/Met

## 2024-08-23 NOTE — ED Triage Notes (Signed)
 68 year old female BIB [facility] from home, reports she was discharged from the hospital earlier today d/t redness from shingles in L eye. She reports today at 1 PM she slid and fell, denies hitting head. Reports being unable to walk or bear weight on the legs for the past 7 hours. Able to move her feet and feels full sensation in both legs. Uses walker at home.

## 2024-08-23 NOTE — Hospital Course (Signed)
 Kathy Owens is a 68 year old woman living with a history of spastic cerebral palsy, chronic lumbar spinal stenosis, chronic pain syndrome, recently treated shingles (in V1), and HTN who presented following a fall at home after early discharge from inpatient rehabilitation, and was admitted for management of spasticity, pain control, and re-evaluation for post-acute rehabilitation placement. No medication changes were made this admission.    # Functional decline after recent CIR discharge / spastic cerebral palsy Kathy Owens is a 68 year old woman with spastic cerebral palsy and severe lumbar spinal stenosis who was discharged from CIR on 10/4 after 19 days of rehab. She initially improved but went home prematurely despite physician recommendations to continue therapy. After arriving home, she fell during a transfer attempt and has since been unable to stand or ambulate. Admitted overnight on 11/4. Imaging in ED shows no fracture, only chronic degenerative stenosis. Patient was without pain and was seen by PT/OT during admission, recommend acute rehab. Discharge on home Dantrolene 100 mg TID and Flexeril  5 mg TID. Outpatient follow up with Dr. Emeline for botulin toxin for left upper extremity spasticity. Cannot tolerate Baclofen  due to hallucinations.   # Pain management Chronic neuropathic and musculoskeletal pain related to CP and spinal stenosis. Discharge on home Norco 5/325 mg q6h PRN.   # Shingles V1 Completed 14 day course of Valtrex on 10/11. Residual left eye conjunctivitis, mild and improved throughout her short stay here. Not having pain. Discharge on home Eucerin and  Lacri-Lube artificial tears. Recommend Shingles vaccine. Outpatient follow up with ophtho for elevated IOP.   # CKD III  On admission, BUN 28, creatinine 0.93, consistent with baseline. Encourage fluids.   # Chronic diarrhea Stable Imodium  2 mg PRN while inpatient. Prophylactic topicals for barrier protection while in  hospital.   # Mood / adjustment Mild situational anxiety, appropriate insight; coping with readmission.  # VTE prophylaxis Discharge on home Xarelto 10 mg daily. CIR discharge summary with plan to continue for 1 month, written on 11/4. EOT 12/2.   # Normocytic Anemia Iron 36 on 08/12/2024, discharged on home ferrous sulfate 325 mg daily.   # HLD Discharged on home Atorvastatin  10 mg daily

## 2024-08-24 DIAGNOSIS — G8929 Other chronic pain: Secondary | ICD-10-CM | POA: Diagnosis not present

## 2024-08-24 DIAGNOSIS — M5416 Radiculopathy, lumbar region: Secondary | ICD-10-CM

## 2024-08-24 DIAGNOSIS — Z9181 History of falling: Secondary | ICD-10-CM

## 2024-08-24 DIAGNOSIS — Z79891 Long term (current) use of opiate analgesic: Secondary | ICD-10-CM

## 2024-08-24 DIAGNOSIS — G808 Other cerebral palsy: Secondary | ICD-10-CM

## 2024-08-24 DIAGNOSIS — W19XXXA Unspecified fall, initial encounter: Secondary | ICD-10-CM | POA: Diagnosis present

## 2024-08-24 MED ORDER — CYCLOBENZAPRINE HCL 5 MG PO TABS
5.0000 mg | ORAL_TABLET | Freq: Three times a day (TID) | ORAL | Status: DC
  Administered 2024-08-24 – 2024-08-26 (×7): 5 mg via ORAL
  Filled 2024-08-24 (×7): qty 1

## 2024-08-24 MED ORDER — LOPERAMIDE HCL 2 MG PO CAPS
2.0000 mg | ORAL_CAPSULE | ORAL | Status: DC | PRN
Start: 2024-08-24 — End: 2024-08-26

## 2024-08-24 MED ORDER — POLYETHYLENE GLYCOL 3350 17 G PO PACK: 17.0000 g | PACK | Freq: Every day | ORAL | Status: DC | PRN

## 2024-08-24 MED ORDER — ATORVASTATIN CALCIUM 10 MG PO TABS
10.0000 mg | ORAL_TABLET | Freq: Every day | ORAL | Status: DC
  Administered 2024-08-24 – 2024-08-25 (×2): 10 mg via ORAL
  Filled 2024-08-24 (×2): qty 1

## 2024-08-24 MED ORDER — RIVAROXABAN 10 MG PO TABS
10.0000 mg | ORAL_TABLET | Freq: Every day | ORAL | Status: DC
  Administered 2024-08-24 – 2024-08-26 (×3): 10 mg via ORAL
  Filled 2024-08-24 (×3): qty 1

## 2024-08-24 MED ORDER — DANTROLENE SODIUM 100 MG PO CAPS
100.0000 mg | ORAL_CAPSULE | Freq: Three times a day (TID) | ORAL | Status: DC
  Administered 2024-08-24 – 2024-08-26 (×7): 100 mg via ORAL
  Filled 2024-08-24 (×10): qty 1

## 2024-08-24 MED ORDER — GERHARDT'S BUTT CREAM
TOPICAL_CREAM | Freq: Every day | CUTANEOUS | Status: DC
  Filled 2024-08-24: qty 60

## 2024-08-24 MED ORDER — ACETAMINOPHEN 325 MG PO TABS: 650.0000 mg | ORAL_TABLET | Freq: Four times a day (QID) | ORAL | Status: DC | PRN

## 2024-08-24 MED ORDER — FERROUS SULFATE 325 (65 FE) MG PO TABS
325.0000 mg | ORAL_TABLET | Freq: Every day | ORAL | Status: DC
  Administered 2024-08-25 – 2024-08-26 (×2): 325 mg via ORAL
  Filled 2024-08-24 (×2): qty 1

## 2024-08-24 MED ORDER — FERROUS SULFATE 325 (65 FE) MG PO TBEC: 325.0000 mg | DELAYED_RELEASE_TABLET | Freq: Every day | ORAL | Status: DC

## 2024-08-24 MED ORDER — HYDROCERIN EX CREA
1.0000 | TOPICAL_CREAM | Freq: Two times a day (BID) | CUTANEOUS | Status: DC
  Administered 2024-08-24 – 2024-08-26 (×5): 1 via TOPICAL
  Filled 2024-08-24 (×2): qty 113

## 2024-08-24 MED ORDER — ACETAMINOPHEN 650 MG RE SUPP: 650.0000 mg | Freq: Four times a day (QID) | RECTAL | Status: DC | PRN

## 2024-08-24 MED ORDER — BISACODYL 10 MG RE SUPP: 10.0000 mg | Freq: Every day | RECTAL | Status: DC | PRN

## 2024-08-24 MED ORDER — TRAZODONE HCL 50 MG PO TABS
50.0000 mg | ORAL_TABLET | Freq: Every evening | ORAL | Status: DC | PRN
Start: 2024-08-24 — End: 2024-08-26
  Administered 2024-08-25: 50 mg via ORAL
  Filled 2024-08-24: qty 1

## 2024-08-24 MED ORDER — ARTIFICIAL TEARS OPHTHALMIC OINT
1.0000 | TOPICAL_OINTMENT | Freq: Three times a day (TID) | OPHTHALMIC | Status: DC | PRN
Start: 2024-08-24 — End: 2024-08-26

## 2024-08-24 MED ORDER — HYDROCODONE-ACETAMINOPHEN 5-325 MG PO TABS: 1.0000 | ORAL_TABLET | Freq: Four times a day (QID) | ORAL | Status: DC | PRN

## 2024-08-24 NOTE — Evaluation (Signed)
 Occupational Therapy Evaluation Patient Details Name: Kathy Owens MRN: [id number] DOB: 07-10-1956 Today's Date: 08/24/2024   History of Present Illness   Kathy Owens is a 68 y.o. female who presents to [facility] 08/23/24 after fall at home. Imaging negative for acute injury. PMHx: spastic cerebral palsy, severe lumbar spinal stenosis, chronic radiculopathy, chronic pain, recent history of herpes zoster, anterior ischemic optic neuropathy bilateral, vertigo, and osteoporosis. Of note, recent admission 10/4-10/16 for worsening spasticity and functional decline, pt d/c'd to CIR from 10/16-11/4, and was recently d/c'd home as she declined to extend her stay in rehab and her sister declined attending education sessions.     Clinical Impressions Pt was needing extensive assistance with mobility and ADLs when she discharged home from rehab. Presents with generalized weakness, fear of falling, poor sitting and standing balance. She needs +2 assist for for all mobility and set up to total assist for ADLs. Patient will benefit from intensive inpatient follow-up therapy, >3 hours/day.     If plan is discharge home, recommend the following:   Two people to help with walking and/or transfers;A lot of help with bathing/dressing/bathroom;Assistance with cooking/housework;Assistance with feeding;Direct supervision/assist for medications management;Direct supervision/assist for financial management;Assist for transportation;Help with stairs or ramp for entrance     Functional Status Assessment   Patient has had a recent decline in their functional status and demonstrates the ability to make significant improvements in function in a reasonable and predictable amount of time.     Equipment Recommendations   Other (comment) (defer to next venue)     Recommendations for Other Services         Precautions/Restrictions   Precautions Precautions: Fall Recall of Precautions/Restrictions:  Impaired Restrictions Weight Bearing Restrictions Per Provider Order: No     Mobility Bed Mobility Overal bed mobility: Needs Assistance Bed Mobility: Rolling, Sidelying to Sit Rolling: Mod assist Sidelying to sit: Mod assist       General bed mobility comments: use of bed pad to roll toward R with rail, mod assist to raise trunk, heavy posterior lean initially with pt fearful of sliding off EOB    Transfers Overall transfer level: Needs assistance Equipment used: Rolling walker (2 wheels), Ambulation equipment used Transfers: Bed to chair/wheelchair/BSC, Sit to/from Stand Sit to Stand: From elevated surface, Min assist, +2 physical assistance, Mod assist           General transfer comment: pt familiar with stedy from AIR, assist to position feet on platform, assist to rise and steady from elevated bed and platforms of stedy Transfer via Lift Equipment: Stedy    Balance Overall balance assessment: Needs assistance   Sitting balance-Leahy Scale: Poor   Postural control: Posterior lean Standing balance support: Bilateral upper extremity supported, During functional activity Standing balance-Leahy Scale: Poor                             ADL either performed or assessed with clinical judgement   ADL Overall ADL's : Needs assistance/impaired Eating/Feeding: Set up;Sitting   Grooming: Brushing hair;Sitting;Total assistance   Upper Body Bathing: Total assistance;Sitting   Lower Body Bathing: Total assistance;+2 for physical assistance;Sit to/from stand   Upper Body Dressing : Maximal assistance;Sitting   Lower Body Dressing: Total assistance;+2 for physical assistance;Sit to/from stand   Toilet Transfer: Total assistance;+2 for physical assistance Toilet Transfer Details (indicate cue type and reason): requires stedy for transfers Toileting- Clothing Manipulation and Hygiene: Total assistance;+2 for physical  assistance;Sit to/from stand                Vision Ability to See in Adequate Light: 0 Adequate Patient Visual Report: No change from baseline       Perception         Praxis         Pertinent Vitals/Pain Pain Assessment Pain Assessment: Faces Faces Pain Scale: Hurts a little bit Pain Location: Generalized with movement Pain Descriptors / Indicators: Grimacing Pain Intervention(s): Monitored during session, Repositioned     Extremity/Trunk Assessment Upper Extremity Assessment Upper Extremity Assessment: Right hand dominant;RUE deficits/detail;LUE deficits/detail RUE Coordination: decreased fine motor;decreased gross motor LUE Coordination: decreased fine motor;decreased gross motor   Lower Extremity Assessment Lower Extremity Assessment: Defer to PT evaluation RLE Deficits / Details: Decreased AROM, limited by increased spasticity. Grossly 3/5 strength. RLE Sensation: decreased proprioception RLE Coordination: decreased gross motor LLE Deficits / Details: Decreased AROM, limited by increased spasticity. Grossly 3-/5 strength. Her foot rested in supination with difficulty maintain flat foot contact on ground. LLE Sensation: decreased proprioception LLE Coordination: decreased gross motor   Cervical / Trunk Assessment Cervical / Trunk Assessment: Kyphotic;Other exceptions (weakness)   Communication Communication Communication: Impaired Factors Affecting Communication: Hearing impaired   Cognition Arousal: Alert Behavior During Therapy: WFL for tasks assessed/performed Cognition: Cognition impaired     Awareness: Intellectual awareness impaired, Online awareness impaired Memory impairment (select all impairments): Short-term memory   Executive functioning impairment (select all impairments): Initiation, Sequencing, Reasoning, Problem solving OT - Cognition Comments: needing assist to use tv remote, poor historian                 Following commands: Impaired Following commands impaired: Only  follows one step commands consistently     Cueing  General Comments   Cueing Techniques: Verbal cues;Gestural cues;Visual cues      Exercises     Shoulder Instructions      Home Living Family/patient expects to be discharged to:: Private residence Living Arrangements: Alone;Other (Comment) (sister visits frequently) Available Help at Discharge: Family;Available 24 hours/day Type of Home: Apartment Home Access: Level entry     Home Layout: One level     Bathroom Shower/Tub: Producer, Television/film/video: Handicapped height Bathroom Accessibility: Yes How Accessible: Accessible via wheelchair;Accessible via walker Home Equipment: Rolling Walker (2 wheels);Rollator (4 wheels);Shower seat;Grab bars - tub/shower;Hand held shower head;Wheelchair - power;Grab bars - toilet;BSC/3in1   Additional Comments: [employer] apartment  Lives With: Alone    Prior Functioning/Environment Prior Level of Function : Patient poor historian/Family not available;Needs assist;History of Falls (last six months)       Physical Assist : Mobility (physical);ADLs (physical) Mobility (physical): Bed mobility;Transfers;Gait ADLs (physical): Bathing;Dressing;Toileting;IADLs Mobility Comments: pt reports leaving AIR needing +2 assist to walk and use of stedy for transfers, had not mastered use of motorized w/c ADLs Comments: pt needing min to total assist for ADLs in AIR    OT Problem List: Decreased strength;Decreased range of motion;Decreased activity tolerance;Impaired balance (sitting and/or standing);Decreased cognition;Decreased safety awareness;Cardiopulmonary status limiting activity;Decreased coordination   OT Treatment/Interventions: Self-care/ADL training;Therapeutic exercise;Energy conservation;DME and/or AE instruction;Therapeutic activities;Patient/family education;Balance training;Cognitive remediation/compensation      OT Goals(Current goals can be found in the care plan  section)   Acute Rehab OT Goals OT Goal Formulation: With patient Time For Goal Achievement: 09/07/24 Potential to Achieve Goals: Fair ADL Goals Pt Will Perform Grooming: with set-up;sitting (supported sitting) Additional ADL Goal #1: Pt will demonstrate fair sitting balance  in preparation for ADLs x 10 minutes. Additional ADL Goal #2: Pt will complete bed mobility with min assist in preparation for ADLs. Additional ADL Goal #3: Pt will stand with RW and mod assist as a precursor to transfers to [facility].   OT Frequency:  Min 2X/week    Co-evaluation PT/OT/SLP Co-Evaluation/Treatment: Yes Reason for Co-Treatment: Complexity of the patient's impairments (multi-system involvement);For patient/therapist safety;To address functional/ADL transfers PT goals addressed during session: Mobility/safety with mobility;Balance;Proper use of DME OT goals addressed during session: ADL's and self-care      AM-PAC OT 6 Clicks Daily Activity     Outcome Measure Help from another person eating meals?: A Little Help from another person taking care of personal grooming?: A Lot Help from another person toileting, which includes using toliet, bedpan, or urinal?: Total Help from another person bathing (including washing, rinsing, drying)?: A Lot Help from another person to put on and taking off regular upper body clothing?: A Lot Help from another person to put on and taking off regular lower body clothing?: Total 6 Click Score: 11   End of Session Equipment Utilized During Treatment: Gait belt;Rolling walker (2 wheels) Nurse Communication: Mobility status  Activity Tolerance: Patient tolerated treatment well Patient left: in bed;with call bell/phone within reach;in chair;with chair alarm set  OT Visit Diagnosis: Unsteadiness on feet (R26.81);Other abnormalities of gait and mobility (R26.89);Muscle weakness (generalized) (M62.81);Other symptoms and signs involving cognitive function;History of falling  (Z91.81)                Time: 8980-8951 OT Time Calculation (min): 29 min Charges:  OT General Charges $OT Visit: 1 Visit OT Evaluation $OT Eval Moderate Complexity: 1 Mod  Kathy Owens, OTR/L Acute Rehabilitation Services Office: [phone]  Kennth Kathy Helling 08/24/2024, 1:35 PM

## 2024-08-24 NOTE — Evaluation (Addendum)
 Physical Therapy Evaluation Patient Details Name: Kathy Owens MRN: [id number] DOB: 22-Jan-1956 Today's Date: 08/24/2024  History of Present Illness  Kathy Owens is a 68 y.o. female who presents to [facility] 08/23/24 after fall at home. Imaging negative for acute injury. PMHx: spastic cerebral palsy, severe lumbar spinal stenosis, chronic radiculopathy, chronic pain, recent history of herpes zoster, anterior ischemic optic neuropathy bilateral, vertigo, and osteoporosis. Of note, recent admission 10/4-10/16 for worsening spasticity and functional decline, pt d/c'd to CIR from 10/16-11/4, and was recently d/c'd home as she declined to extend her stay in rehab and her sister declined attending education sessions.   Clinical Impression  Pt admitted with above diagnosis. PTA, pt required assistance with functional mobility, ADLs, and IADLs. She resides in a ground level apartment at [facility] and reports 24/7 support from her sister. Pt currently with functional limitations due to the deficits listed below (see PT Problem List). She required min-modA for rolling, modA x2 for supine>sit, minA x2 for transfer from an elevated surface using stedy, and modA x2 for sit<>stand from recliner chair using RW. Pt will benefit from acute skilled PT to maximize her independence and safety with mobility to allow discharge. Recommend continued inpatient follow up therapy, <3 hours/day.    If plan is discharge home, recommend the following: A lot of help with bathing/dressing/bathroom;Assistance with cooking/housework;Assist for transportation;Help with stairs or ramp for entrance;Two people to help with walking and/or transfers;Supervision due to cognitive status;Direct supervision/assist for medications management   Can travel by private vehicle   No    Equipment Recommendations Hospital bed;Hoyer lift  Recommendations for Other Services  Rehab consult    Functional Status Assessment Patient has had a recent  decline in their functional status and/or demonstrates limited ability to make significant improvements in function in a reasonable and predictable amount of time     Precautions / Restrictions Precautions Precautions: Fall Recall of Precautions/Restrictions: Impaired Restrictions Weight Bearing Restrictions Per Provider Order: No      Mobility  Bed Mobility Overal bed mobility: Needs Assistance Bed Mobility: Rolling, Supine to Sit, Sit to Supine Rolling: Min assist, Mod assist, Used rails   Supine to sit: Mod assist, +2 for physical assistance, HOB elevated, Used rails     General bed mobility comments: Pt sat up on L side of bed with increased time. She slowly brought BLE towards EOB, moving them incremently a couple of inches at a time. Cued pt on sequencing. She reached across to bed rail to roll onto left side, assist via bed pad to achieve sidelying. Brought BLE off EOB with minA. ModA x2 to elevate trunk.    Transfers Overall transfer level: Needs assistance Equipment used: Rolling walker (2 wheels), Ambulation equipment used Transfers: Bed to chair/wheelchair/BSC, Sit to/from Stand Sit to Stand: From elevated surface, Min assist, +2 physical assistance, Mod assist           General transfer comment: Introduced stedy. Assisted pt in properly placing BLE onto foot plate. She brought BUE onto crossbar. Pt stood from raised bed height with minA x2. She stood from stedy seat with CGA x2. Transferred pt to recliner chair. Good eccentric control. Pt stood from recliner chair using RW, required modA x2 to power up and increased time to acheive upright posture. Transfer via Lift Equipment: Stedy  Ambulation/Gait               General Gait Details: Unable.  Stairs  Wheelchair Mobility     Tilt Bed    Modified Rankin (Stroke Patients Only)       Balance Overall balance assessment: Needs assistance Sitting-balance support: Bilateral upper  extremity supported, Feet supported Sitting balance-Leahy Scale: Poor Sitting balance - Comments: Pt dependent on external support of therapist(s). She required facilitation to bring BUE support off PT and onto bed. Cued pt to hold onto FOB rail. With increased time she was able to lessen to CGA. Postural control: Posterior lean Standing balance support: Bilateral upper extremity supported, During functional activity Standing balance-Leahy Scale: Poor Standing balance comment: Pt dependent on lift equipment vs. AD (stedy vs. RW) and external support of therapist(s).                             Pertinent Vitals/Pain Pain Assessment Pain Assessment: Faces Faces Pain Scale: Hurts a little bit Pain Location: Generalized with movement Pain Descriptors / Indicators: Grimacing Pain Intervention(s): Monitored during session, Repositioned    Home Living Family/patient expects to be discharged to:: Private residence Living Arrangements: Alone;Other (Comment) (Sister visits frequently) Available Help at Discharge: Family;Available 24 hours/day Type of Home: Apartment [facility]) Home Access: Level entry       Home Layout: One level Home Equipment: Agricultural Consultant (2 wheels);Rollator (4 wheels);Shower seat;Grab bars - tub/shower;Hand held shower head;Wheelchair - power;Grab bars - toilet;BSC/3in1 Additional Comments: [employer] apartment    Prior Function Prior Level of Function : Patient poor historian/Family not available;Needs assist;History of Falls (last six months)       Physical Assist : Mobility (physical);ADLs (physical) Mobility (physical): Bed mobility;Transfers;Gait ADLs (physical): Bathing;Dressing;Toileting;IADLs Mobility Comments: Per chart review, pt was d/c Home from CIR at a w/c level requiring supervision. Pt reports transferring with the stedy, walking with +2 assist using RW, and working on driving power w/c. She had 1 fall upon returning home when she  attempted to transfer with her sister in the bathroom and managed to slip out of the w/c onto the floor. ADLs Comments: Per chart review, pt was d/c Home from CIR at supervision to [facility] [facility] for UB and totalA for LB. Pt reports assist for the majority of basic self-care.     Extremity/Trunk Assessment   Upper Extremity Assessment Upper Extremity Assessment: Defer to OT evaluation    Lower Extremity Assessment Lower Extremity Assessment: RLE deficits/detail;LLE deficits/detail RLE Deficits / Details: Decreased AROM, limited by increased spasticity. Grossly 3/5 strength. RLE Sensation: decreased proprioception RLE Coordination: decreased gross motor LLE Deficits / Details: Decreased AROM, limited by increased spasticity. Grossly 3-/5 strength. Her foot rested in supination with difficulty maintain flat foot contact on ground. LLE Sensation: decreased proprioception LLE Coordination: decreased gross motor    Cervical / Trunk Assessment Cervical / Trunk Assessment: Kyphotic  Communication   Communication Communication: Impaired Factors Affecting Communication: Hearing impaired    Cognition Arousal: Alert Behavior During Therapy: WFL for tasks assessed/performed   PT - Cognitive impairments: No family/caregiver present to determine baseline, Orientation, Awareness, Memory, Safety/Judgement   Orientation impairments: Time, Situation                   PT - Cognition Comments: Pt recalled her name, DOB, and that she was in the hospital. She had difficulty answering questions on PLOF, length of stay, etc. Pt stated conflicting information. She required moderate cues throughout session. Following commands: Impaired Following commands impaired: Only follows one step commands consistently     Cueing  Cueing Techniques: Verbal cues, Gestural cues, Visual cues     General Comments      Exercises     Assessment/Plan    PT Assessment Patient needs continued PT services  PT  Problem List Impaired tone;Decreased strength;Decreased range of motion;Decreased activity tolerance;Decreased balance;Decreased mobility       PT Treatment Interventions DME instruction;Gait training;Functional mobility training;Therapeutic exercise;Therapeutic activities;Balance training;Neuromuscular re-education;Patient/family education    PT Goals (Current goals can be found in the Care Plan section)  Acute Rehab PT Goals Patient Stated Goal: Return Home PT Goal Formulation: With patient Time For Goal Achievement: 09/07/24 Potential to Achieve Goals: Fair    Frequency Min 2X/week     Co-evaluation PT/OT/SLP Co-Evaluation/Treatment: Yes Reason for Co-Treatment: Complexity of the patient's impairments (multi-system involvement);For patient/therapist safety;To address functional/ADL transfers PT goals addressed during session: Mobility/safety with mobility;Balance;Proper use of DME         AM-PAC PT 6 Clicks Mobility  Outcome Measure Help needed turning from your back to your side while in a flat bed without using bedrails?: A Lot Help needed moving from lying on your back to sitting on the side of a flat bed without using bedrails?: Total Help needed moving to and from a bed to a chair (including a wheelchair)?: Total Help needed standing up from a chair using your arms (e.g., wheelchair or bedside chair)?: Total Help needed to walk in hospital room?: Total Help needed climbing 3-5 steps with a railing? : Total 6 Click Score: 7    End of Session Equipment Utilized During Treatment: Gait belt Activity Tolerance: Patient tolerated treatment well;Patient limited by fatigue Patient left: in chair;with call bell/phone within reach;with chair alarm set Nurse Communication: Mobility status;Need for lift equipment PT Visit Diagnosis: Unsteadiness on feet (R26.81);Other abnormalities of gait and mobility (R26.89);Muscle weakness (generalized) (M62.81)    Time: 8981-8955 PT  Time Calculation (min) (ACUTE ONLY): 26 min   Charges:   PT Evaluation $PT Eval Moderate Complexity: 1 Mod   PT General Charges $$ ACUTE PT VISIT: 1 Visit         Randall SAUNDERS, PT, DPT Acute Rehabilitation Services Office: [phone] Secure Chat Preferred  Kathy Owens 08/24/2024, 12:39 PM

## 2024-08-24 NOTE — H&P (Signed)
 Date: 08/24/2024               Patient Name:  Kathy Owens MRN: [id number]  DOB: 1955-11-16 Age / Sex: 68 y.o., female   PCP: Trudy Mliss Dragon, MD         Medical Service: Internal Medicine Teaching Service         Attending Physician: Dr. Reyes Fenton      First Contact: Viktoria King, DO}    Second Contact: Dr. Damien Lease, DO         Pager Information: First Contact Pager: [phone]   Second Contact Pager: [phone]   SUBJECTIVE   Chief Complaint: Fall due to weakness  History of Present Illness: Kathy Owens is a 68 y.o. super pleasant lady with PMH of spastic cerebral palsy, severe lumbar spinal stenosis, chronic radiculopathy, and chronic pain, recently discharged from CIR earlier today (11/5) after a 19-day stay for management of worsening spasticity and functional decline. She reports that her rehabilitation was helping, but she elected to go home early despite her physician recommending an additional month of therapy.  After arriving home, she attempted to transfer with assistance from her sister. While maneuvering near the sink, her wheelchair slipped, and she slid to the floor between the wheelchair and cabinet. She denies loss of consciousness, dizziness, or head trauma. Since the fall, she has been unable to stand or transfer, citing severe leg stiffness and fatigue: "I think I went home too soon. I can't pick my feet up." Her sister is unable to assist with transfers safely at home.  CT of the lumbar spine and pelvis showed chronic degenerative stenosis consistent with prior imaging but no acute fracture. She received IV fentanyl in the ED with partial relief. On evaluation, she remains unable to stand or ambulate and requires assistance for bed mobility.  She denies new numbness, paresthesias, bowel or bladder incontinence, fever, or systemic symptoms. She reports taking Dantrolene 100 mg daily and Flexeril  at home. She expresses understanding that she may need  to return to rehabilitation for further therapy and support.   ED Course: Labs significant for no leukocytosis, normal electrolytes, and normal liver function. Imaging CT lumbar spine and pelvis showed chronic multilevel degenerative disease with severe canal stenosis at L1-L2 and moderate stenosis at L3-L4, but no acute fracture or dislocation. Received IV fentanyl for pain control. . Meds:  Patient reported:   Dantrolene 100 mg TID (for spasticity) Cyclobenzaprine  5 mg TID (for spasm) Hydrocodone  acetaminophen  5/325 mg q6h PRN (pain) Loperamide  2 mg daily and PRN (chronic diarrhea) Atorvastatin  10 mg daily (HLD) Triamterene -HCTZ 37.5/25 mg daily (HTN) Acetaminophen  500 mg BID PRN (pain/fever) Alendronate  70 mg weekly (osteoporosis)  Past Medical History  Spastic cerebral palsy Lumbar spinal stenosis with chronic radiculopathy Chronic pain syndrome Hypertension Hyperlipidemia Osteoporosis Chronic kidney disease (III) Depression Chronic diarrhea of unknown origin History of C. difficile colitis Venous insufficiency of left leg Osteonecrosis s/p right total hip arthroplasty History of zoster (V1 distribution) History of vertigo (resolved)  Past Surgical History Past Surgical History:  Procedure Laterality Date   JOINT REPLACEMENT Right     Social:  Lives With: Sister (recently providing full-time assistance) Occupation: Retired Support: Sister Midwife) and limited family support Level of Function: Previously ambulated short distances with walker, now requires maximal assistance for transfers and wheelchair for mobility PCP: Dr. Mliss Dragon Trudy Substances: Tobacco: Never Alcohol: Denies Recreational Drug: Denies  Family History:  Family History  Problem Relation Age of Onset  Heart failure Father        Died at the age of 87.  Was a smoker.   Alcohol abuse Sister        She is 10 years younger than Kathy. Owens     Allergies: Allergies as of 08/23/2024  - Review Complete 08/23/2024  Allergen Reaction Noted   Firvanq  [vancomycin ] Hives 11/06/2018   Kathy contin [morphine] Other (See Comments) 07/10/2015   Lioresal  [baclofen ] Other (See Comments) 07/24/2024   Nsaids Other (See Comments) 07/24/2024   Zanaflex  [tizanidine ] Other (See Comments) 07/24/2024    Review of Systems: A complete ROS was negative except as per HPI.   OBJECTIVE:   Physical Exam: Blood pressure (!) 147/73, pulse 64, temperature 97.6 F (36.4 C), temperature source Oral, resp. rate 17, height 5' (1.524 m), weight 71.2 kg, SpO2 100%.  Constitutional: well-appearing woman lying in bed, in no acute distress HENT: normocephalic, atraumatic, mucous membranes moist Eyes: left eye with mild redness and residual ptosis consistent with recent shingles  Neck: supple, no tenderness Cardiovascular: regular rate and rhythm, no murmurs/rubs/gallops Pulmonary: normal work of breathing on room air, lungs clear bilaterally Abdominal: soft, non-tender, non-distended, normoactive bowel sounds MSK: chronic contractures of lower and upper extremities; spasticity noted; no acute deformity or tenderness Neurological: alert and oriented 3, fluent speech with mild dysarthria, 4/5 strength in upper extremities, 2/5 in lower extremities limited by spasticity, intact sensation, unable to stand or transfer independently Skin: warm, dry, intact; healed zoster rash over left V1 Psych: pleasant, cooperative, mildly anxious but appropriate insight and judgment  Labs: CBC    Component Value Date/Time   WBC 8.4 08/23/2024 2158   RBC 4.39 08/23/2024 2158   HGB 13.1 08/23/2024 2158   HGB 13.8 05/26/2019 1120   HCT 40.0 08/23/2024 2158   HCT 43.0 05/26/2019 1120   PLT 227 08/23/2024 2158   PLT 270 05/26/2019 1120   MCV 91.1 08/23/2024 2158   MCV 91 05/26/2019 1120   MCH 29.8 08/23/2024 2158   MCHC 32.8 08/23/2024 2158   RDW 15.4 08/23/2024 2158   RDW 13.6 05/26/2019 1120   LYMPHSABS 1.4  08/23/2024 2158   LYMPHSABS 1.3 05/26/2019 1120   MONOABS 0.5 08/23/2024 2158   EOSABS 0.3 08/23/2024 2158   EOSABS 0.1 05/26/2019 1120   BASOSABS 0.0 08/23/2024 2158   BASOSABS 0.0 05/26/2019 1120     CMP     Component Value Date/Time   NA 137 08/23/2024 2158   NA 143 10/28/2023 1417   K 3.8 08/23/2024 2158   CL 105 08/23/2024 2158   CO2 21 (L) 08/23/2024 2158   GLUCOSE 96 08/23/2024 2158   BUN 28 (H) 08/23/2024 2158   BUN 35 (H) 10/28/2023 1417   CREATININE 0.93 08/23/2024 2158   CREATININE 1.12 (H) 05/10/2015 1157   CALCIUM  9.2 08/23/2024 2158   PROT 6.7 08/23/2024 2158   PROT 7.0 07/26/2020 0934   ALBUMIN 3.7 08/23/2024 2158   ALBUMIN 4.7 07/26/2020 0934   AST 23 08/23/2024 2158   ALT 19 08/23/2024 2158   ALKPHOS 54 08/23/2024 2158   BILITOT 1.0 08/23/2024 2158   BILITOT 0.5 07/26/2020 0934   GFRNONAA >60 08/23/2024 2158   GFRNONAA 54 (L) 05/10/2015 1157   GFRAA 73 07/26/2020 0934   GFRAA 62 05/10/2015 1157    Imaging:  CT PELVIS WO CONTRAST Result Date: 08/23/2024 EXAM: CT Pelvis, Without IV Contrast 08/23/2024 10:11:49 PM TECHNIQUE: Axial images were acquired through the pelvis without IV contrast. Reformatted images  were reviewed. Automated exposure control, iterative reconstruction, and/or weight based adjustment of the mA/kV was utilized to reduce the radiation dose to as low as reasonably achievable. COMPARISON: CT 08/05/2024 CLINICAL HISTORY: Hip trauma, fracture suspected, no prior imaging. FINDINGS: BONES: Right hip replacement with artifact. Mild-to-moderate degenerative changes of the left hip. No acute fracture or focal osseous lesion. JOINTS: No dislocation. The joint spaces are normal. No significant hip effusion. SOFT TISSUES: Mild nonspecific right greater than left stranding within the subcutaneous soft tissues overlying the hips. INTRAPELVIC CONTENTS: Aortic atherosclerosis. Limited images of the intrapelvic contents demonstrate no acute abnormality.  IMPRESSION: 1. No acute osseous abnormality 2. Right hip arthroplasty with associated artifact. 3. Mild-to-moderate degenerative changes of the left hip. 4. Mild nonspecific subcutaneous soft tissue stranding overlying the hips, greater on the right. Electronically signed by: Luke Bun MD 08/23/2024 10:31 PM EST RP Workstation: [id number]   CT Lumbar Spine Wo Contrast Result Date: 08/23/2024 EXAM: CT OF THE LUMBAR SPINE WITHOUT CONTRAST 08/23/2024 10:11:49 PM TECHNIQUE: CT of the lumbar spine was performed without the administration of intravenous contrast. Multiplanar reformatted images are provided for review. Automated exposure control, iterative reconstruction, and/or weight based adjustment of the mA/kV was utilized to reduce the radiation dose to as low as reasonably achievable. COMPARISON: CT 08/05/2024, CT 10/28/2018, MRI of 07/29/2023. CLINICAL HISTORY: Ataxia, lumbar trauma. FINDINGS: BONES AND ALIGNMENT: Normal vertebral body heights. No acute fracture or suspicious bone lesion. Normal alignment. DEGENERATIVE CHANGES: At T12-L1, the disc space is patent. There are mild facet degenerative changes. No canal stenosis or foraminal narrowing is present. At L1-L2, there is mild disc space narrowing. Diffuse disc bulge with facet degenerative changes and ligamentum flavum thickening. Resultant severe canal stenosis. Patent foramen. At L2-L3, patent disc space. Diffuse disc bulge and ligamentum flavum thickening. Mild canal stenosis. No high-grade foraminal narrowing. At L3-L4, mild disc space narrowing. Diffuse disc bulge and ligamentum flavum thickening. Moderate facet degenerative changes. Moderate canal stenosis. No high-grade bony foraminal narrowing. Right lateral disc protrusion. At L4-L5, patent disc space. Moderate facet degenerative changes and ligamentum flavum thickening. Mild-to-moderate canal stenosis. At L5-S1, patent disc space. No canal stenosis. Patent foramen. Mild left foraminal  narrowing. SOFT TISSUES: 13 mm slightly dense exophytic lesion off the left kidney but stable since 2010 and therefore likely benign, no specific imaging follow-up is recommended. Calcified gallstones. No acute abnormality. IMPRESSION: 1. No acute osseous abnormality of the lumbar spine. 2. Multilevel degenerative changes as described above with Severe canal stenosis at L1-L2, moderate canal stenosis at L3-L4 with right lateral disc protrusion as previously described . Electronically signed by: Luke Bun MD 08/23/2024 10:26 PM EST RP Workstation: [id number]     EKG: personally reviewed my interpretation is. Prior EKG  ASSESSMENT & PLAN:   Assessment & Plan by Problem: Active Problems:   * No active hospital problems. *  Victorya K. Chambless is a 68 year old woman living with a history of spastic cerebral palsy, chronic lumbar spinal stenosis, and chronic pain syndrome who presented with inability to ambulate and functional decline following a fall at home after early discharge from inpatient rehabilitation, and was admitted for management of spasticity, pain control, and re-evaluation for post-acute rehabilitation placement on hospital day 0.  # Functional decline after recent CIR discharge / spastic cerebral palsy Zuley is a 68 year old woman with spastic cerebral palsy and severe lumbar spinal stenosis who was discharged from CIR earlier today after 19 days of rehab. She initially improved but went home prematurely despite physician  recommendations to continue therapy. After arriving home, she fell during a transfer attempt and has since been unable to stand or ambulate. Imaging shows no fracture, only chronic degenerative stenosis. Her functional decline is most likely due to persistent lower extremity spasticity, deconditioning, and insufficient home support. Plan: - Continue Dantrolene 100 mg TID and Flexeril  5 mg TID - PT/OT for mobility and transfer safety - PM&R consult for spasticity  management and level-of-care determination (CIR vs SNF) -TOC consult for safe discharge planning and equipment needs  # Pain management Chronic neuropathic and musculoskeletal pain related to CP and spinal stenosis. Controlled in ED after fentanyl; continues to need short-acting opioid for breakthrough pain. Plan: - Continue Norco 5/325 mg q6h PRN - Avoid NSAIDs (CKD III) - Acetaminophen  PRN for mild pain  # CKD III / mild azotemia BUN 28, creatinine 0.93, consistent with baseline. Mild dehydration likely contributed. Plan: - Encourage oral fluids - Avoid nephrotoxic agents - Recheck BMP in AM  # Chronic diarrhea Stable on Imodium , no infectious symptoms or electrolyte derangements. Plan: - Consider Imodium  2 mg PRN - Monitor for dehydration or constipation  # Skin integrity Reposition every 2 hours Maintain barrier protection; monitor for pressure injuries  # Mood / adjustment Mild situational anxiety, appropriate insight; coping with readmission.  # VTE prophylaxis Continue Xarelto 10 mg daily  Best practice: Diet: Normal VTE: DOAC IVF: None,None Code: Full  Disposition planning: Prior to Admission Living Arrangement: CIR Anticipated Discharge Location: SNF  Dispo: Admit patient to Observation with expected length of stay less than 2 midnights.  Signed: Bernadine Manos, MD Internal Medicine Resident  08/24/2024, 12:18 AM  On Call pager: [phone]

## 2024-08-24 NOTE — Care Management Obs Status (Signed)
 MEDICARE OBSERVATION STATUS NOTIFICATION   Patient Details  Name: Kathy Owens MRN: [id number] Date of Birth: 07-25-1956   Medicare Observation Status Notification Given:  Yes    Bridget Cordella Simmonds, LCSW 08/24/2024, 3:11 PM

## 2024-08-24 NOTE — TOC Initial Note (Signed)
 Transition of Care [facility]) - Initial/Assessment Note    Patient Details  Name: Kathy Owens MRN: [id number] Date of Birth: [birth year]  Transition of Care [facility]) CM/SW Contact:    Bridget Cordella Simmonds, LCSW Phone Number: 08/24/2024, 3:27 PM  Clinical Narrative:     CSW informed that pt not able to return to CIR.  CSW informed pt of this, discussed SNF STR and pt is agreeable to this.  Pt from home alone, sister [name] comes by daily to assist.  HH had not yet begun after DC from CIR but was arranged with [facility].  Permission given to speak with [name] and to send out referral in the hub.  Medicare choice document provided.  Referral sent out in hub for SNF.               Expected Discharge Plan: Skilled Nursing Facility Barriers to Discharge: SNF Pending bed offer   Patient Goals and CMS Choice Patient states their goals for this hospitalization and ongoing recovery are:: walk again CMS Medicare.gov Compare Post Acute Care list provided to:: Patient Choice offered to / list presented to : Patient      Expected Discharge Plan and Services In-house Referral: Clinical Social Work   Post Acute Care Choice: Skilled Nursing Facility Living arrangements for the past 2 months: Single Family Home                                      Prior Living Arrangements/Services Living arrangements for the past 2 months: Single Family Home Lives with:: Self Patient language and need for interpreter reviewed:: Yes Do you feel safe going back to the place where you live?: Yes      Need for Family Participation in Patient Care: Yes (Comment) Care giver support system in place?: Yes (comment) Current home services: Home OT, Home PT Cathie) Criminal Activity/Legal Involvement Pertinent to Current Situation/Hospitalization: No - Comment as needed  Activities of Daily Living   ADL Screening (condition at time of admission) Independently performs ADLs?: No Does the patient have a NEW  difficulty with bathing/dressing/toileting/self-feeding that is expected to last >3 days?: Yes (Initiates electronic notice to provider for possible OT consult) Does the patient have a NEW difficulty with getting in/out of bed, walking, or climbing stairs that is expected to last >3 days?: Yes (Initiates electronic notice to provider for possible PT consult) Does the patient have a NEW difficulty with communication that is expected to last >3 days?: No Is the patient deaf or have difficulty hearing?: No Does the patient have difficulty seeing, even when wearing glasses/contacts?: No Does the patient have difficulty concentrating, remembering, or making decisions?: No  Permission Sought/Granted Permission sought to share information with : Family Supports Permission granted to share information with : Yes, Verbal Permission Granted  Share Information with NAME: sister [name]  Permission granted to share info w AGENCY: SNF        Emotional Assessment Appearance:: Appears stated age Attitude/Demeanor/Rapport: Engaged Affect (typically observed): Appropriate, Pleasant Orientation: : Oriented to Self, Oriented to Place, Oriented to  Time, Oriented to Situation      Admission diagnosis:  Lumbar radiculopathy [M54.16] Fall [W19.XXXA] Fall, initial encounter [id number]] Patient Active Problem List   Diagnosis Date Noted   Fall 08/24/2024   Lumbar radiculopathy 08/24/2024   Coping style affecting medical condition 08/10/2024   Cerebral palsy, diplegic (HCC) 08/04/2024   Spinal stenosis in  cervical region 07/28/2024   Rapidly progressive weakness 07/26/2024   Impaired mobility 07/24/2024   History of progressive weakness 07/24/2024   Zoster, V1 distribution without ocular involvement 07/24/2024   Need for home health care 07/06/2024   Chronic instability of right knee 06/16/2024   Chronic diarrhea of unknown origin 03/24/2024   Spasticity 01/25/2024   Corns and callosities 07/22/2022    Muscle spasticity 01/30/2021   Chronic use of opiate drug for therapeutic purpose 07/12/2020   Lactose intolerance in adult (presumptive dx) 07/12/2020   Porcelain gallbladder 11/04/2018   Non-arteritic AION (anterior ischemic optic neuropathy), bilateral 07/01/2017   Chronic pain due to cerebral palsy 12/13/2015   Pain of right midfoot 05/10/2015   Vertigo, recurrent 07/06/2014   Subacromial impingement of right shoulder 08/10/2013   Health care maintenance 07/29/2011   Essential hypertension 02/15/2007   Age-related osteoporosis without current pathological fracture 02/15/2007   Hyperlipemia 10/09/2006   Cerebral palsy (HCC) 10/09/2006   Venous insufficiency of left leg 10/09/2006   PCP:  Trudy Mliss Dragon, MD Pharmacy:   [id number] - [place], [place] - [address] AT [facility] OF [address] [place] [address] Phone: [phone] Fax: [phone]  [facility] [address] [place] [address] Phone: [phone] Fax: [phone]     Social Drivers of Health (SDOH) Social History: SDOH Screenings   Food Insecurity: No Food Insecurity (08/24/2024)  Housing: Low Risk  (08/24/2024)  Transportation Needs: No Transportation Needs (08/24/2024)  Utilities: Not At Risk (08/24/2024)  Alcohol Screen: Low Risk  (06/17/2023)  Depression (PHQ2-9): Low Risk  (05/05/2024)  Financial Resource Strain: Low Risk  (06/17/2023)  Physical Activity: Insufficiently Active (06/17/2023)  Social Connections: Socially Isolated (08/24/2024)  Stress: No Stress Concern Present (06/17/2023)  Tobacco Use: Low Risk  (08/23/2024)  Health Literacy: Adequate Health Literacy (06/17/2023)   SDOH Interventions:     Readmission Risk Interventions     No data to display

## 2024-08-24 NOTE — Progress Notes (Signed)
 HD#0 SUBJECTIVE:  Patient Summary: Kathy Owens is a 68 y.o. F with PMH of spastic quadriplegic cerebral palsy, severe lumbar spinal stenosis, chronic radiculopathy, chronic pain, HLD recently discharged from CIR on 11/4 and presented to the ED after a fall at home.   Overnight Events: admitted  Interim History: She is very kind and appreciative of her care. Denies any pain. She doesn't feel stable on her feet and expresses her desire to return to inpatient rehab.   OBJECTIVE:  Vital Signs: Vitals:   08/24/24 0115 08/24/24 0130 08/24/24 0148 08/24/24 0410  BP:    107/62  Pulse: 70 69  65  Resp: 17 16  17   Temp:   98.3 F (36.8 C) 98 F (36.7 C)  TempSrc:   Axillary Oral  SpO2: 96% 100%  99%  Weight:      Height:       Supplemental O2: Room Air SpO2: 99 %  Filed Weights   08/23/24 2101  Weight: 71.2 kg    No intake or output data in the 24 hours ending 08/24/24 0803 Net IO Since Admission: No IO data has been entered for this period [08/24/24 0803]  Physical Exam: Physical Exam Vitals reviewed.  Constitutional:      General: She is not in acute distress. HENT:     Nose: Nose normal.     Mouth/Throat:     Mouth: Mucous membranes are moist.  Eyes:     Comments: L eye conjunctivitis  Cardiovascular:     Rate and Rhythm: Normal rate and regular rhythm.     Heart sounds: Normal heart sounds.  Pulmonary:     Effort: Pulmonary effort is normal.  Abdominal:     General: Bowel sounds are normal.     Palpations: Abdomen is soft.  Musculoskeletal:        General: No signs of injury.     Right lower leg: No edema.     Left lower leg: No edema.     Comments: Chronic contractures  Skin:    General: Skin is warm.  Neurological:     Mental Status: She is alert and oriented to person, place, and time.  Psychiatric:        Mood and Affect: Mood normal.     Patient Lines/Drains/Airways Status     Active Line/Drains/Airways     Name Placement date Placement  time Site Days   Peripheral IV 08/23/24 20 G Anterior;Right Forearm 08/23/24  2158  Forearm  1   Wound 08/04/24 2057 Irritant Contact Dermatitis Perineum Medial 08/04/24  2057  Perineum  20   Wound 08/04/24 2057 Irritant Contact Dermatitis Buttocks Medial 08/04/24  2057  Buttocks  20            Pertinent labs and imaging:      Latest Ref Rng & Units 08/23/2024    9:58 PM 08/22/2024    4:33 AM 08/18/2024    4:56 AM  CBC  WBC 4.0 - 10.5 K/uL 8.4  5.8  6.4   Hemoglobin 12.0 - 15.0 g/dL 86.8  88.7  89.0   Hematocrit 36.0 - 46.0 % 40.0  34.7  32.5   Platelets 150 - 400 K/uL 227  184  192        Latest Ref Rng & Units 08/23/2024    9:58 PM 08/22/2024    4:33 AM 08/19/2024    5:04 AM  CMP  Glucose 70 - 99 mg/dL 96  94  898  BUN 8 - 23 mg/dL 28  32  24   Creatinine 0.44 - 1.00 mg/dL 9.06  9.11  9.10   Sodium 135 - 145 mmol/L 137  139  139   Potassium 3.5 - 5.1 mmol/L 3.8  3.9  4.2   Chloride 98 - 111 mmol/L 105  106  105   CO2 22 - 32 mmol/L 21  20  21    Calcium  8.9 - 10.3 mg/dL 9.2  8.6  8.9   Total Protein 6.5 - 8.1 g/dL 6.7   5.8   Total Bilirubin 0.0 - 1.2 mg/dL 1.0   0.9   Alkaline Phos 38 - 126 U/L 54   46   AST 15 - 41 U/L 23   18   ALT 0 - 44 U/L 19   17     CT PELVIS WO CONTRAST Result Date: 08/23/2024 EXAM: CT Pelvis, Without IV Contrast 08/23/2024 10:11:49 PM TECHNIQUE: Axial images were acquired through the pelvis without IV contrast. Reformatted images were reviewed. Automated exposure control, iterative reconstruction, and/or weight based adjustment of the mA/kV was utilized to reduce the radiation dose to as low as reasonably achievable. COMPARISON: CT 08/05/2024 CLINICAL HISTORY: Hip trauma, fracture suspected, no prior imaging. FINDINGS: BONES: Right hip replacement with artifact. Mild-to-moderate degenerative changes of the left hip. No acute fracture or focal osseous lesion. JOINTS: No dislocation. The joint spaces are normal. No significant hip effusion. SOFT  TISSUES: Mild nonspecific right greater than left stranding within the subcutaneous soft tissues overlying the hips. INTRAPELVIC CONTENTS: Aortic atherosclerosis. Limited images of the intrapelvic contents demonstrate no acute abnormality. IMPRESSION: 1. No acute osseous abnormality 2. Right hip arthroplasty with associated artifact. 3. Mild-to-moderate degenerative changes of the left hip. 4. Mild nonspecific subcutaneous soft tissue stranding overlying the hips, greater on the right. Electronically signed by: Luke Bun MD 08/23/2024 10:31 PM EST RP Workstation: [id number]   CT Lumbar Spine Wo Contrast Result Date: 08/23/2024 EXAM: CT OF THE LUMBAR SPINE WITHOUT CONTRAST 08/23/2024 10:11:49 PM TECHNIQUE: CT of the lumbar spine was performed without the administration of intravenous contrast. Multiplanar reformatted images are provided for review. Automated exposure control, iterative reconstruction, and/or weight based adjustment of the mA/kV was utilized to reduce the radiation dose to as low as reasonably achievable. COMPARISON: CT 08/05/2024, CT 10/28/2018, MRI of 07/29/2023. CLINICAL HISTORY: Ataxia, lumbar trauma. FINDINGS: BONES AND ALIGNMENT: Normal vertebral body heights. No acute fracture or suspicious bone lesion. Normal alignment. DEGENERATIVE CHANGES: At T12-L1, the disc space is patent. There are mild facet degenerative changes. No canal stenosis or foraminal narrowing is present. At L1-L2, there is mild disc space narrowing. Diffuse disc bulge with facet degenerative changes and ligamentum flavum thickening. Resultant severe canal stenosis. Patent foramen. At L2-L3, patent disc space. Diffuse disc bulge and ligamentum flavum thickening. Mild canal stenosis. No high-grade foraminal narrowing. At L3-L4, mild disc space narrowing. Diffuse disc bulge and ligamentum flavum thickening. Moderate facet degenerative changes. Moderate canal stenosis. No high-grade bony foraminal narrowing. Right lateral  disc protrusion. At L4-L5, patent disc space. Moderate facet degenerative changes and ligamentum flavum thickening. Mild-to-moderate canal stenosis. At L5-S1, patent disc space. No canal stenosis. Patent foramen. Mild left foraminal narrowing. SOFT TISSUES: 13 mm slightly dense exophytic lesion off the left kidney but stable since 2010 and therefore likely benign, no specific imaging follow-up is recommended. Calcified gallstones. No acute abnormality. IMPRESSION: 1. No acute osseous abnormality of the lumbar spine. 2. Multilevel degenerative changes as described above with Severe canal  stenosis at L1-L2, moderate canal stenosis at L3-L4 with right lateral disc protrusion as previously described . Electronically signed by: Luke Bun MD 08/23/2024 10:26 PM EST RP Workstation: [id number]    ASSESSMENT/PLAN:  Assessment: Principal Problem:   Fall   Plan: # Spastic cerebral palsy, pending CIR Doesn't feel stead when ambulating, prefers to go back to CIR before going home.  - Dantrolene 100 mg TID and Flexeril  5 mg TID - PT/OT for mobility and transfer safety - TOC consult for safe discharge planning and equipment needs  # Pain management Comfortable and not in any pain. - Continue Norco 5/325 mg q6h PRN - Avoid NSAIDs (CKD III) - Acetaminophen  PRN for mild pain  # CKD III / mild azotemia #Anemia  Consider fluids if not drinking  -BMP AM -ferrous sulfate 325 mg daily  - Avoid nephrotoxic agents  # Chronic diarrhea Stable on Imodium . - Imodium  2 mg PRN  # Skin integrity Maintain barrier protection; monitor for pressure injuries. If she's very sedentary tomorrow, consider changing to air mattress -Gerhardt's butt cream  # Mood / adjustment Mild situational anxiety, appropriate insight; coping with readmission.  # VTE prophylaxis Xarelto 10 mg daily  # HLD -home atorvastatin  10 mg daily    Best Practice: Diet: Regular diet IVF: Fluids: none, Rate: None VTE: rivaroxaban  (XARELTO) tablet 10 mg Start: 08/24/24 1000 Code: Full  Disposition planning: Therapy Recs: Inpatient rehab, pending OT recs, DME: other hospital bed and hoyer lift Family Contact: Sister, to be notified. DISPO: Anticipated discharge in 1-2 days to inpatient rehab pending Insurance for SNF coverage.  Signature:  Viktoria Charmayne [facility]  8:03 AM, 08/24/2024  On Call pager [phone]

## 2024-08-24 NOTE — Progress Notes (Signed)
 Recreational Therapy Discharge Summary Patient Details  Name: Kathy Owens MRN: [id number] Date of Birth: April 28, 1956 Today's Date:   Comments on progress toward goals: Pt was discharged home from CIR yesterday at overall supervision- min assist level.  Due to limitations with TR staffing/availability, pt was not seen for TR services outside of evaluation.  Reasons for discharge: discharge from hospital  Follow-up: Home Health  Sinead Hockman 08/24/2024, 3:14 PM

## 2024-08-24 NOTE — Plan of Care (Signed)
  Problem: Education: Goal: Knowledge of General Education information will improve Description: Including pain rating scale, medication(s)/side effects and non-pharmacologic comfort measures 08/24/2024 0603 by Debby Truett FALCON, RN Outcome: Progressing 08/24/2024 0603 by Debby Truett FALCON, RN Outcome: Progressing   Problem: Health Behavior/Discharge Planning: Goal: Ability to manage health-related needs will improve 08/24/2024 0603 by Debby Truett FALCON, RN Outcome: Progressing 08/24/2024 0603 by Debby Truett FALCON, RN Outcome: Progressing   Problem: Clinical Measurements: Goal: Ability to maintain clinical measurements within normal limits will improve 08/24/2024 0603 by Debby Truett FALCON, RN Outcome: Progressing 08/24/2024 0603 by Debby Truett FALCON, RN Outcome: Progressing Goal: Will remain free from infection 08/24/2024 0603 by Debby Truett FALCON, RN Outcome: Progressing 08/24/2024 0603 by Debby Truett FALCON, RN Outcome: Progressing Goal: Diagnostic test results will improve 08/24/2024 0603 by Debby Truett FALCON, RN Outcome: Progressing 08/24/2024 0603 by Debby Truett FALCON, RN Outcome: Progressing Goal: Respiratory complications will improve 08/24/2024 0603 by Debby Truett FALCON, RN Outcome: Progressing 08/24/2024 0603 by Debby Truett FALCON, RN Outcome: Progressing Goal: Cardiovascular complication will be avoided 08/24/2024 0603 by Debby Truett FALCON, RN Outcome: Progressing 08/24/2024 0603 by Debby Truett FALCON, RN Outcome: Progressing   Problem: Activity: Goal: Risk for activity intolerance will decrease 08/24/2024 0603 by Debby Truett FALCON, RN Outcome: Progressing 08/24/2024 0603 by Debby Truett FALCON, RN Outcome: Progressing   Problem: Nutrition: Goal: Adequate nutrition will be maintained 08/24/2024 0603 by Debby Truett FALCON, RN Outcome: Progressing 08/24/2024 0603 by Debby Truett FALCON, RN Outcome: Progressing   Problem: Coping: Goal: Level of anxiety will decrease 08/24/2024 0603 by Debby Truett FALCON, RN Outcome: Progressing 08/24/2024 0603 by Debby Truett FALCON, RN Outcome: Progressing   Problem: Elimination: Goal: Will not experience complications related to bowel motility 08/24/2024 0603 by Debby Truett FALCON, RN Outcome: Progressing 08/24/2024 0603 by Debby Truett FALCON, RN Outcome: Progressing Goal: Will not experience complications related to urinary retention 08/24/2024 0603 by Debby Truett FALCON, RN Outcome: Progressing 08/24/2024 0603 by Debby Truett FALCON, RN Outcome: Progressing   Problem: Pain Managment: Goal: General experience of comfort will improve and/or be controlled 08/24/2024 0603 by Debby Truett FALCON, RN Outcome: Progressing 08/24/2024 0603 by Debby Truett FALCON, RN Outcome: Progressing   Problem: Safety: Goal: Ability to remain free from injury will improve 08/24/2024 0603 by Debby Truett FALCON, RN Outcome: Progressing 08/24/2024 0603 by Debby Truett FALCON, RN Outcome: Progressing   Problem: Skin Integrity: Goal: Risk for impaired skin integrity will decrease 08/24/2024 0603 by Debby Truett FALCON, RN Outcome: Progressing 08/24/2024 0603 by Debby Truett FALCON, RN Outcome: Progressing

## 2024-08-24 NOTE — NC FL2 (Signed)
 [place]  MEDICAID FL2 LEVEL OF CARE FORM     IDENTIFICATION  Patient Name: Kathy Owens Birthdate: 07-18-1956 Sex: female Admission Date (Current Location): 08/23/2024  [place] and [place] Number:  Lloyd [id number] Facility and Address:  [facility], [address] [place], [place] [address]      Provider Number: [id number]  Attending Physician Name and Address:  Eben Reyes BROCKS, MD  Relative Name and Phone Number:  Melody [name] Ahumada [phone]  [phone]    Current Level of Care: Hospital Recommended Level of Care: Skilled Nursing Facility Prior Approval Number:    Date Approved/Denied:   PASRR Number: [id number]  Discharge Plan: SNF    Current Diagnoses: Patient Active Problem List   Diagnosis Date Noted   Fall 08/24/2024   Lumbar radiculopathy 08/24/2024   Coping style affecting medical condition 08/10/2024   Cerebral palsy, diplegic (HCC) 08/04/2024   Spinal stenosis in cervical region 07/28/2024   Rapidly progressive weakness 07/26/2024   Impaired mobility 07/24/2024   History of progressive weakness 07/24/2024   Zoster, V1 distribution without ocular involvement 07/24/2024   Need for home health care 07/06/2024   Chronic instability of right knee 06/16/2024   Chronic diarrhea of unknown origin 03/24/2024   Spasticity 01/25/2024   Corns and callosities 07/22/2022   Muscle spasticity 01/30/2021   Chronic use of opiate drug for therapeutic purpose 07/12/2020   Lactose intolerance in adult (presumptive dx) 07/12/2020   Porcelain gallbladder 11/04/2018   Non-arteritic AION (anterior ischemic optic neuropathy), bilateral 07/01/2017   Chronic pain due to cerebral palsy 12/13/2015   Pain of right midfoot 05/10/2015   Vertigo, recurrent 07/06/2014   Subacromial impingement of right shoulder 08/10/2013   Health care maintenance 07/29/2011   Essential hypertension 02/15/2007   Age-related osteoporosis without current  pathological fracture 02/15/2007   Hyperlipemia 10/09/2006   Cerebral palsy (HCC) 10/09/2006   Venous insufficiency of left leg 10/09/2006    Orientation RESPIRATION BLADDER Height & Weight     Self, Time, Situation, Place  Normal Incontinent Weight: 157 lb (71.2 kg) Height:  5' (152.4 cm)  BEHAVIORAL SYMPTOMS/MOOD NEUROLOGICAL BOWEL NUTRITION STATUS      Incontinent Diet (see discharge summary)  AMBULATORY STATUS COMMUNICATION OF NEEDS Skin   Total Care Verbally Other (Comment) (redness)                       Personal Care Assistance Level of Assistance  Bathing, Feeding, Dressing, Total care Bathing Assistance: Maximum assistance Feeding assistance: Limited assistance Dressing Assistance: Maximum assistance Total Care Assistance: Maximum assistance   Functional Limitations Info  Sight, Hearing, Speech Sight Info: Adequate Hearing Info: Impaired Speech Info: Adequate    SPECIAL CARE FACTORS FREQUENCY  PT (By licensed PT), OT (By licensed OT)     PT Frequency: 5x week OT Frequency: 5x week            Contractures Contractures Info: Not present    Additional Factors Info  Code Status, Allergies Code Status Info: full Allergies Info: Firvanq  (Vancomycin ), Ms Contin (Morphine), Lioresal  (Baclofen ), Nsaids, Zanaflex  (Tizanidine )           Current Medications (08/24/2024):  This is the current hospital active medication list Current Facility-Administered Medications  Medication Dose Route Frequency Provider Last Rate Last Admin   acetaminophen  (TYLENOL ) tablet 650 mg  650 mg Oral Q6H PRN Azadegan, Maryam, MD       Or   acetaminophen  (TYLENOL ) suppository 650 mg  650 mg  Rectal Q6H PRN Azadegan, Maryam, MD       artificial tears (LACRILUBE) ophthalmic ointment 1 Application  1 Application Left Eye TID PRN Azadegan, Maryam, MD       atorvastatin  (LIPITOR) tablet 10 mg  10 mg Oral QPC supper Azadegan, Maryam, MD       bisacodyl (DULCOLAX) suppository 10 mg  10  mg Rectal Daily PRN Azadegan, Maryam, MD       cyclobenzaprine  (FLEXERIL ) tablet 5 mg  5 mg Oral TID Azadegan, Maryam, MD   5 mg at 08/24/24 1106   dantrolene (DANTRIUM) capsule 100 mg  100 mg Oral TID Azadegan, Maryam, MD   100 mg at 08/24/24 1259   [START ON 08/25/2024] ferrous sulfate tablet 325 mg  325 mg Oral Q breakfast Eben Reyes BROCKS, MD       Gerhardt's butt cream   Topical Daily Lyles, Elliott, DO       hydrocerin (EUCERIN) cream 1 Application  1 Application Topical BID Azadegan, Maryam, MD   1 Application at 08/24/24 1300   HYDROcodone -acetaminophen  (NORCO/VICODIN) 5-325 MG per tablet 1 tablet  1 tablet Oral Q6H PRN Azadegan, Maryam, MD       loperamide  (IMODIUM ) capsule 2 mg  2 mg Oral PRN Lyles, Elliott, DO       rivaroxaban (XARELTO) tablet 10 mg  10 mg Oral Daily Azadegan, Maryam, MD   10 mg at 08/24/24 1106   traZODone (DESYREL) tablet 50 mg  50 mg Oral QHS PRN Azadegan, Maryam, MD         Discharge Medications: Please see discharge summary for a list of discharge medications.  Relevant Imaging Results:  Relevant Lab Results:   Additional Information SSN: [id number]  Bridget Cordella Simmonds, LCSW

## 2024-08-24 NOTE — Progress Notes (Signed)
 Inpatient Rehab Admissions Coordinator:   Per therapy recommendations pt was screened for CIR by Reche Lowers, PT, DPT.  Note admitted after a fall yesterday after discharging from CIR.  Reviewed documentation from CIR stay and current admission.  Discussed with Dr. Cornelio and rehab team.  Pt declining surgery for management of her lumbar myelopathy, pt/family declining family education on CIR, and pt/family requesting discharge home on 11/4.  Offered extension to try and achieve more gains on rehab but pt/family declined.  We cannot offer a bed for her to return to CIR.  Other AIRs could be explored vs SNF.    Reche Lowers, PT, DPT Admissions Coordinator [phone] 08/24/24 3:43 PM

## 2024-08-25 DIAGNOSIS — K529 Noninfective gastroenteritis and colitis, unspecified: Secondary | ICD-10-CM

## 2024-08-25 DIAGNOSIS — G809 Cerebral palsy, unspecified: Secondary | ICD-10-CM | POA: Diagnosis not present

## 2024-08-25 DIAGNOSIS — D631 Anemia in chronic kidney disease: Secondary | ICD-10-CM

## 2024-08-25 DIAGNOSIS — F419 Anxiety disorder, unspecified: Secondary | ICD-10-CM

## 2024-08-25 DIAGNOSIS — N183 Chronic kidney disease, stage 3 unspecified: Secondary | ICD-10-CM

## 2024-08-25 DIAGNOSIS — Z9181 History of falling: Secondary | ICD-10-CM | POA: Diagnosis not present

## 2024-08-25 DIAGNOSIS — M5416 Radiculopathy, lumbar region: Secondary | ICD-10-CM | POA: Diagnosis not present

## 2024-08-25 DIAGNOSIS — Z751 Person awaiting admission to adequate facility elsewhere: Secondary | ICD-10-CM

## 2024-08-25 DIAGNOSIS — Z86718 Personal history of other venous thrombosis and embolism: Secondary | ICD-10-CM

## 2024-08-25 NOTE — TOC Progression Note (Addendum)
 Transition of Care [facility]) - Progression Note    Patient Details  Name: Adalene K Kelliher MRN: [id number] Date of Birth: [birth year]  Transition of Care [facility]) CM/SW Contact  Bridget Cordella Simmonds, LCSW Phone Number: 08/25/2024, 10:01 AM  Clinical Narrative:   Bed offers provided to pt.  She will accept offer at [facility].  CSW confirmed with [facility] that they can receive pt today with approved auth.  9049: SNF auth request submitted in [place].   1525: SNF auth remains pending in [place].   Expected Discharge Plan: Skilled Nursing Facility Barriers to Discharge: SNF Pending bed offer               Expected Discharge Plan and Services In-house Referral: Clinical Social Work   Post Acute Care Choice: Skilled Nursing Facility Living arrangements for the past 2 months: Single Family Home                                       Social Drivers of Health (SDOH) Interventions SDOH Screenings   Food Insecurity: No Food Insecurity (08/24/2024)  Housing: Low Risk  (08/24/2024)  Transportation Needs: No Transportation Needs (08/24/2024)  Utilities: Not At Risk (08/24/2024)  Alcohol Screen: Low Risk  (06/17/2023)  Depression (PHQ2-9): Low Risk  (05/05/2024)  Financial Resource Strain: Low Risk  (06/17/2023)  Physical Activity: Insufficiently Active (06/17/2023)  Social Connections: Socially Isolated (08/24/2024)  Stress: No Stress Concern Present (06/17/2023)  Tobacco Use: Low Risk  (08/23/2024)  Health Literacy: Adequate Health Literacy (06/17/2023)    Readmission Risk Interventions     No data to display

## 2024-08-25 NOTE — Progress Notes (Signed)
 HD#0 SUBJECTIVE:   Overnight Events: none  Interim History: She is pleasant and happy this morning. She is not in any pain. She didn't walk yesterday but she did sit up and move from her bed. Moving her bowels and urinating normally. Looking forward to eating breakfast. Understands the plan for SNF.   OBJECTIVE:  Vital Signs: Vitals:   08/24/24 0901 08/24/24 1726 08/24/24 1917 08/25/24 0300  BP: 136/76 125/63 124/62 129/73  Pulse: 96 73 75 67  Resp: 16 18 18 18   Temp: 98.6 F (37 C) (!) 97.4 F (36.3 C) 97.8 F (36.6 C) 97.8 F (36.6 C)  TempSrc: Oral Oral Oral   SpO2: 100% 100% 99% 97%  Weight:      Height:       Supplemental O2: Room Air SpO2: 97 %  Filed Weights   08/23/24 2101  Weight: 71.2 kg     Intake/Output Summary (Last 24 hours) at 08/25/2024 0645 Last data filed at 08/24/2024 1454 Gross per 24 hour  Intake 480 ml  Output --  Net 480 ml   Net IO Since Admission: 480 mL [08/25/24 0645]  Physical Exam: Physical Exam Vitals reviewed.  Constitutional:      Appearance: Normal appearance.  HENT:     Nose: Nose normal.  Eyes:     Comments: L eye conjunctivitis improving   Cardiovascular:     Rate and Rhythm: Normal rate and regular rhythm.     Heart sounds: Normal heart sounds.  Pulmonary:     Effort: Pulmonary effort is normal.     Breath sounds: Normal breath sounds.  Abdominal:     General: Bowel sounds are normal.     Palpations: Abdomen is soft.  Musculoskeletal:     Comments: Right side strength > L side strength  Able to move BL LE Chronic contractures  Skin:    General: Skin is warm.  Neurological:     Mental Status: She is alert. Mental status is at baseline.  Psychiatric:        Mood and Affect: Mood normal.        Behavior: Behavior normal.     Patient Lines/Drains/Airways Status     Active Line/Drains/Airways     Name Placement date Placement time Site Days   Peripheral IV 08/23/24 20 G Anterior;Right Forearm 08/23/24   2158  Forearm  2   Wound 08/04/24 2057 Irritant Contact Dermatitis Perineum Medial 08/04/24  2057  Perineum  21   Wound 08/04/24 2057 Irritant Contact Dermatitis Buttocks Medial 08/04/24  2057  Buttocks  21            Pertinent labs and imaging:      Latest Ref Rng & Units 08/23/2024    9:58 PM 08/22/2024    4:33 AM 08/18/2024    4:56 AM  CBC  WBC 4.0 - 10.5 K/uL 8.4  5.8  6.4   Hemoglobin 12.0 - 15.0 g/dL 86.8  88.7  89.0   Hematocrit 36.0 - 46.0 % 40.0  34.7  32.5   Platelets 150 - 400 K/uL 227  184  192        Latest Ref Rng & Units 08/23/2024    9:58 PM 08/22/2024    4:33 AM 08/19/2024    5:04 AM  CMP  Glucose 70 - 99 mg/dL 96  94  898   BUN 8 - 23 mg/dL 28  32  24   Creatinine 0.44 - 1.00 mg/dL 9.06  9.11  9.10  Sodium 135 - 145 mmol/L 137  139  139   Potassium 3.5 - 5.1 mmol/L 3.8  3.9  4.2   Chloride 98 - 111 mmol/L 105  106  105   CO2 22 - 32 mmol/L 21  20  21    Calcium  8.9 - 10.3 mg/dL 9.2  8.6  8.9   Total Protein 6.5 - 8.1 g/dL 6.7   5.8   Total Bilirubin 0.0 - 1.2 mg/dL 1.0   0.9   Alkaline Phos 38 - 126 U/L 54   46   AST 15 - 41 U/L 23   18   ALT 0 - 44 U/L 19   17     No results found.  ASSESSMENT/PLAN:  Assessment: Principal Problem:   Fall Active Problems:   Lumbar radiculopathy   Plan: # Spastic cerebral palsy, pending SNF Not a candidate for CIR anymore. Working with CSW to find a SNF.  - Dantrolene 100 mg TID and Flexeril  5 mg TID - TOC consult for safe discharge planning and equipment needs  # Pain management Comfortable and not in any pain. - Continue Norco 5/325 mg q6h PRN - Avoid NSAIDs (CKD III) - Acetaminophen  PRN for mild pain  # CKD III / mild azotemia #Anemia  Not pain or itchiness with urinating. Consider fluids if not drinking.  -BMP AM -ferrous sulfate 325 mg daily  - Avoid nephrotoxic agents  # Chronic diarrhea Stable on Imodium . - Imodium  2 mg PRN  # Skin integrity She did sit up in bed yesterday. Maintain  barrier protection; monitor for pressure injuries.  -Gerhardt's butt cream  # Mood / adjustment Mild situational anxiety, appropriate insight; coping with readmission.  # VTE prophylaxis Xarelto 10 mg daily   # HLD -home atorvastatin  10 mg daily   Best Practice: Diet: Regular diet IVF: Fluids: none, Rate: None VTE: rivaroxaban (XARELTO) tablet 10 mg Start: 08/24/24 1000 Code: Full  Disposition planning: Therapy Recs: SNF, DME: other hospital bed and hoyer Family Contact: Sister, to be notified. DISPO: Anticipated discharge in 1-2 days to Skilled nursing facility pending Insurance for SNF coverage.  Signature:  Viktoria Charmayne [facility]ol[facility]  6:45 AM, 08/25/2024  On Call pager [phone]

## 2024-08-25 NOTE — Discharge Instructions (Addendum)
 Thank you for allowing us  to be part of your care. You were hospitalized after a fall.   We made no changes to your medications. Please continue to take the medications that you were discharged on after leaving inpatient rehab on 11/4.   FOLLOW UP APPOINTMENTS: Please see your family doctor, Dr. Trudy, on 11/20. Please see Dr. Cornelio on 12/15.  Please call Dr. Lord office to schedule an appointment.   Please call your PCP or our clinic if you have any questions or concerns, we may be able to help and keep you from a long and expensive emergency room wait. Our clinic and after hours phone number is [phone]. The best time to call is Monday through Friday 9 am to 4 pm but there is always someone available 24/7 if you have an emergency. If you need medication refills please notify your pharmacy one week in advance and they will send us  a request.   We are glad you are feeling better,  Kathy Owens Internal Medicine Inpatient Teaching Service at [facility]

## 2024-08-25 NOTE — Plan of Care (Signed)

## 2024-08-26 DIAGNOSIS — Z7901 Long term (current) use of anticoagulants: Secondary | ICD-10-CM

## 2024-08-26 DIAGNOSIS — M5416 Radiculopathy, lumbar region: Secondary | ICD-10-CM | POA: Diagnosis not present

## 2024-08-26 DIAGNOSIS — W19XXXD Unspecified fall, subsequent encounter: Secondary | ICD-10-CM

## 2024-08-26 DIAGNOSIS — G800 Spastic quadriplegic cerebral palsy: Secondary | ICD-10-CM

## 2024-08-26 DIAGNOSIS — Z8619 Personal history of other infectious and parasitic diseases: Secondary | ICD-10-CM

## 2024-08-26 DIAGNOSIS — G8929 Other chronic pain: Secondary | ICD-10-CM | POA: Diagnosis not present

## 2024-08-26 LAB — BASIC METABOLIC PANEL WITH GFR
Anion gap: 13 (ref 5–15)
BUN: 34 mg/dL — ABNORMAL HIGH (ref 8–23)
CO2: 18 mmol/L — ABNORMAL LOW (ref 22–32)
Calcium: 8.3 mg/dL — ABNORMAL LOW (ref 8.9–10.3)
Chloride: 106 mmol/L (ref 98–111)
Creatinine, Ser: 0.8 mg/dL (ref 0.44–1.00)
GFR, Estimated: >60 mL/min (ref >60)
Glucose, Bld: 97 mg/dL (ref 70–99)
Potassium: 3.6 mmol/L (ref 3.5–5.1)
Sodium: 137 mmol/L (ref 135–145)

## 2024-08-26 MED ORDER — HYDROCERIN EX CREA: 1.0000 | TOPICAL_CREAM | Freq: Two times a day (BID) | CUTANEOUS | Status: AC

## 2024-08-26 NOTE — Progress Notes (Signed)
 Mobility Specialist Progress Note:    08/26/24 1210  Mobility  Activity Pivoted/transferred from chair to bed  Level of Assistance Maximum assist, patient does 25-49% (+2)  Assistive Device Stedy  Activity Response Tolerated well  Mobility Referral Yes  Mobility visit 1 Mobility  Mobility Specialist Start Time (ACUTE ONLY) 1154  Mobility Specialist Stop Time (ACUTE ONLY) 1206  Mobility Specialist Time Calculation (min) (ACUTE ONLY) 12 min   Pt received in chair agreeable to transfer back to bed. At chair left height pt required MaxA +2 for STS. Transferred via stedy. ModA +2 for bed mobility. Left in bed w/ call bell and personal belongings in reach. All needs met. Bed alarm on.  Thersia Minder Mobility Specialist  Please contact vis Secure Chat or  Rehab Office [phone]

## 2024-08-26 NOTE — Discharge Planning (Signed)
 Patient alert. IV access removed. Discharge teaching given to Vernell, RN at [facility]. Discharge summary placed in discharge packet. Patient will be transported to the facility via [facility].

## 2024-08-26 NOTE — TOC Transition Note (Addendum)
 Transition of Care [facility]) - Discharge Note   Patient Details  Name: Kathy Owens MRN: [id number] Date of Birth: Mar 14, 1956  Transition of Care [facility]) CM/SW Contact:  Bridget Cordella Simmonds, LCSW Phone Number: 08/26/2024, 1:26 PM   Clinical Narrative:   Pt discharging to [facility], room 705. RN report to [phone].   [facility] called [phone].   Final next level of care: Skilled Nursing Facility Barriers to Discharge: Barriers Resolved   Patient Goals and CMS Choice Patient states their goals for this hospitalization and ongoing recovery are:: walk again CMS Medicare.gov Compare Post Acute Care list provided to:: Patient Choice offered to / list presented to : Patient      Discharge Placement              Patient chooses bed at: [facility] Patient to be transferred to facility by: [facility] Name of family member notified: sister Daisy Patient and family notified of of transfer: 08/26/24  Discharge Plan and Services Additional resources added to the After Visit Summary for   In-house Referral: Clinical Social Work   Post Acute Care Choice: Skilled Nursing Facility                               Social Drivers of Health (SDOH) Interventions SDOH Screenings   Food Insecurity: No Food Insecurity (08/24/2024)  Housing: Low Risk  (08/24/2024)  Transportation Needs: No Transportation Needs (08/24/2024)  Utilities: Not At Risk (08/24/2024)  Alcohol Screen: Low Risk  (06/17/2023)  Depression (PHQ2-9): Low Risk  (05/05/2024)  Financial Resource Strain: Low Risk  (06/17/2023)  Physical Activity: Insufficiently Active (06/17/2023)  Social Connections: Socially Isolated (08/24/2024)  Stress: No Stress Concern Present (06/17/2023)  Tobacco Use: Low Risk  (08/23/2024)  Health Literacy: Adequate Health Literacy (06/17/2023)     Readmission Risk Interventions     No data to display

## 2024-08-26 NOTE — Progress Notes (Addendum)
 Mobility Specialist Progress Note:    08/26/24 1142  Mobility  Activity Pivoted/transferred from bed to chair  Level of Assistance Minimal assist, patient does 75% or more (+2)  Assistive Device Stedy  Activity Response Tolerated well  Mobility Referral Yes  Mobility visit 1 Mobility  Mobility Specialist Start Time (ACUTE ONLY) 1024  Mobility Specialist Stop Time (ACUTE ONLY) 1039  Mobility Specialist Time Calculation (min) (ACUTE ONLY) 15 min   Pt received in bed agreeable to mobility. No c/o throughout. Required MaxA +2 to get to EOB and MinA +2 for STS. Was able to utilize stedy to get pt to chair. Left in chair w/ call bell and personal belongings in reach. All needs met. Chair alarm on.  Thersia Minder Mobility Specialist  Please contact vis Secure Chat or  Rehab Office [phone]

## 2024-08-26 NOTE — TOC Progression Note (Addendum)
 Transition of Care [facility]) - Progression Note    Patient Details  Name: Kathy Owens MRN: [id number] Date of Birth: Jun 23, 1956  [facility]) CM/SW Contact  Bridget Cordella Simmonds, LCSW Phone Number: 08/26/2024, 8:23 AM  Clinical Narrative:   SNF auth request remains pending in [place].   1110: SNF auth approved: [id number], 5 days: 11/6-11/10.  CSW confirmed with [facility] that they can receive pt today.   MD notified.  Expected Discharge Plan: Skilled Nursing Facility Barriers to Discharge: SNF Pending bed offer               Expected Discharge Plan and Services In-house Referral: Clinical Social Work   Post Acute Care Choice: Skilled Nursing Facility Living arrangements for the past 2 months: Single Family Home                                       Social Drivers of Health (SDOH) Interventions SDOH Screenings   Food Insecurity: No Food Insecurity (08/24/2024)  Housing: Low Risk  (08/24/2024)  Transportation Needs: No Transportation Needs (08/24/2024)  Utilities: Not At Risk (08/24/2024)  Alcohol Screen: Low Risk  (06/17/2023)  Depression (PHQ2-9): Low Risk  (05/05/2024)  Financial Resource Strain: Low Risk  (06/17/2023)  Physical Activity: Insufficiently Active (06/17/2023)  Social Connections: Socially Isolated (08/24/2024)  Stress: No Stress Concern Present (06/17/2023)  Tobacco Use: Low Risk  (08/23/2024)  Health Literacy: Adequate Health Literacy (06/17/2023)    Readmission Risk Interventions     No data to display

## 2024-08-26 NOTE — Discharge Summary (Signed)
 Name: Kathy Owens MRN: [id number] DOB: 1956/06/02 68 y.o. PCP: Trudy Mliss Dragon, MD  Date of Admission: 08/23/2024  8:51 PM Date of Discharge: 08/26/2024 Attending Physician: Dr. Reyes Fenton  Discharge Diagnosis: 1. Principal Problem:   Fall Active Problems:   Lumbar radiculopathy   Discharge Medications: Allergies as of 08/26/2024       Reactions   Firvanq  [vancomycin ] Hives   PO vancomycin  for C.diff (not IV).  TDD 11/09/18.   Ms Contin [morphine] Other (See Comments)   Hallucinations   Lioresal  [baclofen ] Other (See Comments)   Hallucinations    Nsaids Other (See Comments)   Hx kidney disease   Zanaflex  [tizanidine ] Other (See Comments)   Hallucinations         Medication List     PAUSE taking these medications    triamterene -hydrochlorothiazide  37.5-25 MG tablet Wait to take this until your doctor or other care provider tells you to start again. Commonly known as: MAXZIDE -25 Take 1 tablet by mouth daily.       STOP taking these medications    bisacodyl 10 MG suppository Commonly known as: DULCOLAX   Dry Eye Relief Drops 0.2-0.2-1 % Soln Generic drug: Glycerin-Hypromellose-PEG 400       TAKE these medications    acetaminophen  500 MG tablet Commonly known as: TYLENOL  Take 1,000 mg by mouth every 6 (six) hours as needed for mild pain (pain score 1-3) or moderate pain (pain score 4-6).   alendronate  70 MG tablet Commonly known as: Fosamax  Take 1 tablet (70 mg total) by mouth every 7 (seven) days. Take with a full glass of water on an empty stomach. What changed:  when to take this additional instructions   artificial tears Oint ophthalmic ointment Commonly known as: LACRILUBE Place into the left eye 3 (three) times daily as needed for dry eyes.   atorvastatin  10 MG tablet Commonly known as: LIPITOR TAKE 1 TABLET(10 MG) BY MOUTH DAILY What changed:  how much to take how to take this when to take this additional instructions    cyanocobalamin  500 MCG tablet Commonly known as: VITAMIN B12 Take 500 mcg by mouth in the morning.   cyclobenzaprine  5 MG tablet Commonly known as: FLEXERIL  Take 1 tablet (5 mg total) by mouth 3 (three) times daily.   dantrolene 100 MG capsule Commonly known as: DANTRIUM Take 1 capsule (100 mg total) by mouth 3 (three) times daily.   FeroSul 325 (65 Fe) MG tablet Generic drug: ferrous sulfate Take 1 tablet (325 mg total) by mouth daily with breakfast.   Gerhardt's butt cream Crea Apply 1 Application topically 2 (two) times daily.   hydrocerin Crea Apply 1 Application topically 2 (two) times daily. What changed:  when to take this reasons to take this   HYDROcodone -acetaminophen  5-325 MG tablet Commonly known as: NORCO/VICODIN Take 1 tablet by mouth every 6 (six) hours as needed for moderate pain (pain score 4-6) or severe pain (pain score 7-10).   loperamide  2 MG capsule Commonly known as: IMODIUM  TAKE 1 CAPSULE(2 MG) BY MOUTH DAILY FOR UP TO 360 DOSES AS NEEDED FOR DIARRHEA OR LOOSE STOOLS What changed: See the new instructions.   traZODone 50 MG tablet Commonly known as: DESYREL Take 1 tablet (50 mg total) by mouth at bedtime as needed for sleep.   Xarelto 10 MG Tabs tablet Generic drug: rivaroxaban Take 1 tablet (10 mg total) by mouth daily.  Discharge Care Instructions  (From admission, onward)           Start     Ordered   08/26/24 0000  Discharge wound care:       Comments: Chronic diarrhea, prophylactic barrier cream   08/26/24 1136            Disposition and follow-up:   Kathy Owens was discharged from [facility] in Stable condition.  At the hospital follow up visit please address:  1.  Cerebral palsy with chronic contractures and spasticity: Ensure patient has appointment with Dr. Emeline for Botox injections, may need to place ambulatory referral to PM&R.  Treated VZV to V1 distribution found to  have elevated IOP: Ensure patient has ambulatory referral to Ophthalmology.   2.  Labs / imaging needed at time of follow-up: none  3.  Pending labs/ test needing follow-up: none   Follow-up Appointments:  Contact information for follow-up providers     Trudy Mliss Dragon, MD. Go on 09/08/2024.   Specialty: Internal Medicine Why: at 9:15AM for hospital follow up Contact information: [address] [place] [address] [phone]         Emeline Joesph BROCKS, DO. Call today.   Specialty: Physical Medicine and Rehabilitation Why: To make a hospital follow up appointment to address your spasticity Contact information: [address] [place] [address] [phone]         Cornelio Bouchard, MD. Go on 10/03/2024.   Specialty: Physical Medicine and Rehabilitation Contact information: [address] Kathy [place] [address] [phone]              Contact information for after-discharge care     Destination     [facility] .   Service: Skilled Nursing Contact information: [address] [place]  [address] [phone]                      Hospital Course by problem list: Kathy Owens is a 68 year old woman living with a history of spastic cerebral palsy, chronic lumbar spinal stenosis, chronic pain syndrome, recently treated shingles (in V1), and HTN who presented following a fall at home after early discharge from inpatient rehabilitation, and was admitted for management of spasticity, pain control, and re-evaluation for post-acute rehabilitation placement. No medication changes were made this admission.    # Functional decline after recent CIR discharge / spastic cerebral palsy Kathy Owens is a 68 year old woman with spastic cerebral palsy and severe lumbar spinal stenosis who was discharged from CIR on 10/4 after 19 days of rehab. She initially improved but went  home prematurely despite physician recommendations to continue therapy. After arriving home, she fell during a transfer attempt and has since been unable to stand or ambulate. Admitted overnight on 11/4. Imaging in ED shows no fracture, only chronic degenerative stenosis. Patient was without pain and was seen by PT/OT during admission, recommend acute rehab. Discharge on home Dantrolene 100 mg TID and Flexeril  5 mg TID. Outpatient follow up with Dr. Emeline for botulin toxin for left upper extremity spasticity. Cannot tolerate Baclofen  due to hallucinations.   # Pain management Chronic neuropathic and musculoskeletal pain related to CP and spinal stenosis. Discharge on home Norco 5/325 mg q6h PRN.   # Shingles V1 Completed 14 day course of Valtrex on 10/11. Residual left eye conjunctivitis, mild and improved throughout her short stay here. Not having pain. Discharge  on home Eucerin and  Lacri-Lube artificial tears. Recommend Shingles vaccine. Outpatient follow up with ophtho for elevated IOP.   # CKD III  On admission, BUN 28, creatinine 0.93, consistent with baseline. Encourage fluids.   # Chronic diarrhea Stable Imodium  2 mg PRN while inpatient. Prophylactic topicals for barrier protection while in hospital.   # Mood / adjustment Mild situational anxiety, appropriate insight; coping with readmission.  # VTE prophylaxis Discharge on home Xarelto 10 mg daily. CIR discharge summary with plan to continue for 1 month, written on 11/4. EOT 12/2.   # Normocytic Anemia Iron 36 on 08/12/2024, discharged on home ferrous sulfate 325 mg daily.   # HLD Discharged on home Atorvastatin  10 mg daily   Subjective  Slept well over night. Ate dinner without problems. Denies pain anywhere. Normal bowels and urination. She did talk with her sister yesterday and understands the plan for acute rehab. She feels like she is getting weaker by sitting in bed. We will try to get her up and walking before she  discharges.   Discharge Exam:   BP 131/60 (BP Location: Right Arm)   Pulse 76   Temp 97.9 F (36.6 C)   Resp 17   Ht 5' (1.524 m)   Wt 71.2 kg   SpO2 96%   BMI 30.66 kg/m  Discharge exam:  Vitals reviewed.  Constitutional:      Appearance: Normal appearance.  HENT:     Nose: Nose normal.  Eyes:     Comments: Right eye normal  Left eye conjunctivitis improving   Cardiovascular:     Rate and Rhythm: Normal rate and regular rhythm.     Pulses: Normal pulses.  Pulmonary:     Effort: Pulmonary effort is normal.  Abdominal:     General: Bowel sounds are normal.     Palpations: Abdomen is soft.  Musculoskeletal:     Comments: Chronic contractures  Skin:    General: Skin is warm and dry.  Neurological:     Mental Status: She is alert. Mental status is at baseline.  Psychiatric:        Mood and Affect: Mood normal.        Behavior: Behavior normal.   Pertinent Labs, Studies, and Procedures:     Latest Ref Rng & Units 08/23/2024    9:58 PM 08/22/2024    4:33 AM 08/18/2024    4:56 AM  CBC  WBC 4.0 - 10.5 K/uL 8.4  5.8  6.4   Hemoglobin 12.0 - 15.0 g/dL 86.8  88.7  89.0   Hematocrit 36.0 - 46.0 % 40.0  34.7  32.5   Platelets 150 - 400 K/uL 227  184  192        Latest Ref Rng & Units 08/26/2024    3:30 AM 08/23/2024    9:58 PM 08/22/2024    4:33 AM  CMP  Glucose 70 - 99 mg/dL 97  96  94   BUN 8 - 23 mg/dL 34  28  32   Creatinine 0.44 - 1.00 mg/dL 9.19  9.06  9.11   Sodium 135 - 145 mmol/L 137  137  139   Potassium 3.5 - 5.1 mmol/L 3.6  3.8  3.9   Chloride 98 - 111 mmol/L 106  105  106   CO2 22 - 32 mmol/L 18  21  20    Calcium  8.9 - 10.3 mg/dL 8.3  9.2  8.6   Total Protein 6.5 - 8.1 g/dL  6.7  Total Bilirubin 0.0 - 1.2 mg/dL  1.0    Alkaline Phos 38 - 126 U/L  54    AST 15 - 41 U/L  23    ALT 0 - 44 U/L  19      CT PELVIS WO CONTRAST Result Date: 08/23/2024 EXAM: CT Pelvis, Without IV Contrast 08/23/2024 10:11:49 PM TECHNIQUE: Axial images were acquired  through the pelvis without IV contrast. Reformatted images were reviewed. Automated exposure control, iterative reconstruction, and/or weight based adjustment of the mA/kV was utilized to reduce the radiation dose to as low as reasonably achievable. COMPARISON: CT 08/05/2024 CLINICAL HISTORY: Hip trauma, fracture suspected, no prior imaging. FINDINGS: BONES: Right hip replacement with artifact. Mild-to-moderate degenerative changes of the left hip. No acute fracture or focal osseous lesion. JOINTS: No dislocation. The joint spaces are normal. No significant hip effusion. SOFT TISSUES: Mild nonspecific right greater than left stranding within the subcutaneous soft tissues overlying the hips. INTRAPELVIC CONTENTS: Aortic atherosclerosis. Limited images of the intrapelvic contents demonstrate no acute abnormality. IMPRESSION: 1. No acute osseous abnormality 2. Right hip arthroplasty with associated artifact. 3. Mild-to-moderate degenerative changes of the left hip. 4. Mild nonspecific subcutaneous soft tissue stranding overlying the hips, greater on the right. Electronically signed by: Luke Bun MD 08/23/2024 10:31 PM EST RP Workstation: [id number]   CT Lumbar Spine Wo Contrast Result Date: 08/23/2024 EXAM: CT OF THE LUMBAR SPINE WITHOUT CONTRAST 08/23/2024 10:11:49 PM TECHNIQUE: CT of the lumbar spine was performed without the administration of intravenous contrast. Multiplanar reformatted images are provided for review. Automated exposure control, iterative reconstruction, and/or weight based adjustment of the mA/kV was utilized to reduce the radiation dose to as low as reasonably achievable. COMPARISON: CT 08/05/2024, CT 10/28/2018, MRI of 07/29/2023. CLINICAL HISTORY: Ataxia, lumbar trauma. FINDINGS: BONES AND ALIGNMENT: Normal vertebral body heights. No acute fracture or suspicious bone lesion. Normal alignment. DEGENERATIVE CHANGES: At T12-L1, the disc space is patent. There are mild facet degenerative  changes. No canal stenosis or foraminal narrowing is present. At L1-L2, there is mild disc space narrowing. Diffuse disc bulge with facet degenerative changes and ligamentum flavum thickening. Resultant severe canal stenosis. Patent foramen. At L2-L3, patent disc space. Diffuse disc bulge and ligamentum flavum thickening. Mild canal stenosis. No high-grade foraminal narrowing. At L3-L4, mild disc space narrowing. Diffuse disc bulge and ligamentum flavum thickening. Moderate facet degenerative changes. Moderate canal stenosis. No high-grade bony foraminal narrowing. Right lateral disc protrusion. At L4-L5, patent disc space. Moderate facet degenerative changes and ligamentum flavum thickening. Mild-to-moderate canal stenosis. At L5-S1, patent disc space. No canal stenosis. Patent foramen. Mild left foraminal narrowing. SOFT TISSUES: 13 mm slightly dense exophytic lesion off the left kidney but stable since 2010 and therefore likely benign, no specific imaging follow-up is recommended. Calcified gallstones. No acute abnormality. IMPRESSION: 1. No acute osseous abnormality of the lumbar spine. 2. Multilevel degenerative changes as described above with Severe canal stenosis at L1-L2, moderate canal stenosis at L3-L4 with right lateral disc protrusion as previously described . Electronically signed by: Luke Bun MD 08/23/2024 10:26 PM EST RP Workstation: [id number]     Discharge Instructions: Discharge Instructions     Diet - low sodium heart healthy   Complete by: As directed    Discharge instructions   Complete by: As directed    Thank you for allowing us  to be part of your care. You were hospitalized after a fall.    We made no changes to your medications. Please continue to take the  medications that you were discharged on after leaving inpatient rehab on 11/4.    FOLLOW UP APPOINTMENTS: Please see your family doctor, Dr. Trudy, on 11/20. Please see Dr. Cornelio on 12/15.  Please call Dr. Lord  office to schedule an appointment.    Please call your PCP or our clinic if you have any questions or concerns, we may be able to help and keep you from a long and expensive emergency room wait. Our clinic and after hours phone number is [phone]. The best time to call is Monday through Friday 9 am to 4 pm but there is always someone available 24/7 if you have an emergency. If you need medication refills please notify your pharmacy one week in advance and they will send us  a request.    We are glad you are feeling better,   Viktoria King Internal Medicine Inpatient Teaching Service at [facility]   Discharge wound care:   Complete by: As directed    Chronic diarrhea, prophylactic barrier cream   Increase activity slowly   Complete by: As directed        Signed: King Viktoria, DO 08/26/2024, 11:45 AM

## 2024-08-26 NOTE — Plan of Care (Signed)
   Problem: Activity: Goal: Risk for activity intolerance will decrease Outcome: Progressing   Problem: Safety: Goal: Ability to remain free from injury will improve Outcome: Progressing   Problem: Skin Integrity: Goal: Risk for impaired skin integrity will decrease Outcome: Progressing

## 2024-09-08 ENCOUNTER — Ambulatory Visit: Admitting: Internal Medicine

## 2024-09-19 ENCOUNTER — Telehealth: Payer: Self-pay | Admitting: Internal Medicine

## 2024-09-19 DIAGNOSIS — G894 Chronic pain syndrome: Secondary | ICD-10-CM

## 2024-09-19 NOTE — Telephone Encounter (Unsigned)
 Copied from CRM #[id number]. Topic: Clinical - Medication Refill >> Sep 19, 2024  8:50 AM Carrielelia G wrote: Medication: HYDROcodone -acetaminophen  (NORCO/VICODIN) 5-325 MG tablet  Has the patient contacted their pharmacy? No (Agent: If no, request that the patient contact the pharmacy for the refill. If patient does not wish to contact the pharmacy document the reason why and proceed with request.) (Agent: If yes, when and what did the pharmacy advise?)  This is the patient's preferred pharmacy:  [id number] - [place], [place] - [address] AT [facility] [name] [place] [address] Phone: [phone] Fax: [phone]  Is this the correct pharmacy for this prescription? Yes If no, delete pharmacy and type the correct one.    Is the patient out of the medication? Yes  Has the patient been seen for an appointment in the last year OR does the patient have an upcoming appointment? Yes  Can we respond through MyChart? No  Agent: Please be advised that Rx refills may take up to 3 business days. We ask that you follow-up with your pharmacy.

## 2024-09-20 ENCOUNTER — Telehealth: Payer: Self-pay

## 2024-09-20 MED ORDER — HYDROCODONE-ACETAMINOPHEN 5-325 MG PO TABS: 1.0000 | ORAL_TABLET | Freq: Four times a day (QID) | ORAL | 0 refills | Status: DC | PRN

## 2024-09-20 NOTE — Telephone Encounter (Signed)
 Message left for. a call back again. No answer.

## 2024-09-20 NOTE — Telephone Encounter (Signed)
 Pt called, asking for an Anti-depressant.  I left message on voicemail to check in with pt about this-    The nurses have also called 2 different times and unable to reach her.   Will continue to try and contact her. If she's been on one before that was helpful, can retry- if not, would try  Celexa 20 mg daily.

## 2024-09-20 NOTE — Telephone Encounter (Signed)
 Patient called yesterday:  Kathy Owens has a hospital follow on 10/03/2024 with you. She wanted to know if you will prescribe a medication for her depression? (Call back phone [phone]).  I attempted to call twice for more details but no answer,

## 2024-09-21 NOTE — Telephone Encounter (Signed)
 I tried again to reach Charlina Disanti yesterday afternoon, without success.  I call sister [name] (one of her contacts [phone]) this morning. [name] stated Kathy Owens is currently in rehab at [facility], in [place] [phone]).   I called to confirm her location. Per Lavanda (Child Psychotherapist at [facility]) Nandita Doebler is currently a patient in the facility.

## 2024-09-22 ENCOUNTER — Telehealth: Payer: Self-pay | Admitting: *Deleted

## 2024-09-22 NOTE — Telephone Encounter (Signed)
 Call to Pharmacy. Prescription is at the Pharmacy to be filled.  Copied from CRM #[id number]. Topic: Clinical - Medication Refill >> Sep 19, 2024  8:50 AM Carrielelia G wrote: Medication: HYDROcodone -acetaminophen  (NORCO/VICODIN) 5-325 MG tablet  Has the patient contacted their pharmacy? No (Agent: If no, request that the patient contact the pharmacy for the refill. If patient does not wish to contact the pharmacy document the reason why and proceed with request.) (Agent: If yes, when and what did the pharmacy advise?)  This is the patient's preferred pharmacy:  [id number] - [place], [place] - [address] AT [facility] OF [name] [place] [address] Phone: [phone] Fax: [phone]  Is this the correct pharmacy for this prescription? Yes If no, delete pharmacy and type the correct one.    Is the patient out of the medication? Yes  Has the patient been seen for an appointment in the last year OR does the patient have an upcoming appointment? Yes  Can we respond through MyChart? No  Agent: Please be advised that Rx refills may take up to 3 business days. We ask that you follow-up with your pharmacy. >> Sep 22, 2024  9:09 AM Miquel SAILOR wrote: Medication: HYDROcodone -acetaminophen  (NORCO/VICODIN) 5-325 MG tablet  PT needs update on medication. Let her know 3 day for refill. 12/04 is 33rd day. Will wait for refill.

## 2024-09-25 ENCOUNTER — Emergency Department (HOSPITAL_COMMUNITY)

## 2024-09-25 ENCOUNTER — Observation Stay (HOSPITAL_COMMUNITY)
Admission: EM | Admit: 2024-09-25 | Discharge: 2024-10-05 | Disposition: A | Attending: Internal Medicine | Admitting: Internal Medicine

## 2024-09-25 ENCOUNTER — Encounter (HOSPITAL_COMMUNITY): Payer: Self-pay | Admitting: Emergency Medicine

## 2024-09-25 DIAGNOSIS — K529 Noninfective gastroenteritis and colitis, unspecified: Secondary | ICD-10-CM | POA: Diagnosis present

## 2024-09-25 DIAGNOSIS — M48061 Spinal stenosis, lumbar region without neurogenic claudication: Secondary | ICD-10-CM | POA: Diagnosis not present

## 2024-09-25 DIAGNOSIS — L8990 Pressure ulcer of unspecified site, unspecified stage: Secondary | ICD-10-CM | POA: Insufficient documentation

## 2024-09-25 DIAGNOSIS — M545 Low back pain, unspecified: Secondary | ICD-10-CM | POA: Diagnosis present

## 2024-09-25 DIAGNOSIS — M48062 Spinal stenosis, lumbar region with neurogenic claudication: Secondary | ICD-10-CM

## 2024-09-25 DIAGNOSIS — G809 Cerebral palsy, unspecified: Secondary | ICD-10-CM | POA: Diagnosis not present

## 2024-09-25 DIAGNOSIS — N183 Chronic kidney disease, stage 3 unspecified: Secondary | ICD-10-CM | POA: Diagnosis not present

## 2024-09-25 DIAGNOSIS — R29898 Other symptoms and signs involving the musculoskeletal system: Principal | ICD-10-CM | POA: Diagnosis present

## 2024-09-25 DIAGNOSIS — G894 Chronic pain syndrome: Secondary | ICD-10-CM | POA: Diagnosis not present

## 2024-09-25 DIAGNOSIS — D649 Anemia, unspecified: Secondary | ICD-10-CM | POA: Diagnosis not present

## 2024-09-25 DIAGNOSIS — I129 Hypertensive chronic kidney disease with stage 1 through stage 4 chronic kidney disease, or unspecified chronic kidney disease: Secondary | ICD-10-CM | POA: Diagnosis not present

## 2024-09-25 DIAGNOSIS — M6281 Muscle weakness (generalized): Secondary | ICD-10-CM | POA: Diagnosis not present

## 2024-09-25 DIAGNOSIS — M5441 Lumbago with sciatica, right side: Secondary | ICD-10-CM

## 2024-09-25 DIAGNOSIS — E119 Type 2 diabetes mellitus without complications: Secondary | ICD-10-CM | POA: Diagnosis not present

## 2024-09-25 DIAGNOSIS — L89311 Pressure ulcer of right buttock, stage 1: Secondary | ICD-10-CM | POA: Diagnosis not present

## 2024-09-25 DIAGNOSIS — G8929 Other chronic pain: Secondary | ICD-10-CM | POA: Diagnosis present

## 2024-09-25 DIAGNOSIS — M5442 Lumbago with sciatica, left side: Secondary | ICD-10-CM | POA: Diagnosis not present

## 2024-09-25 DIAGNOSIS — E785 Hyperlipidemia, unspecified: Secondary | ICD-10-CM | POA: Diagnosis not present

## 2024-09-25 LAB — URINALYSIS, W/ REFLEX TO CULTURE (INFECTION SUSPECTED)
Bilirubin Urine: NEGATIVE
Glucose, UA: NEGATIVE mg/dL
Ketones, ur: NEGATIVE mg/dL
Leukocytes,Ua: NEGATIVE
Nitrite: NEGATIVE
Protein, ur: NEGATIVE mg/dL
Specific Gravity, Urine: 1.013 (ref 1.005–1.030)
pH: 7 (ref 5.0–8.0)

## 2024-09-25 LAB — COMPREHENSIVE METABOLIC PANEL WITH GFR
ALT: 53 U/L — ABNORMAL HIGH (ref 0–44)
AST: 41 U/L (ref 15–41)
Albumin: 3.5 g/dL (ref 3.5–5.0)
Alkaline Phosphatase: 79 U/L (ref 38–126)
Anion gap: 10 (ref 5–15)
BUN: 11 mg/dL (ref 8–23)
CO2: 25 mmol/L (ref 22–32)
Calcium: 8.7 mg/dL — ABNORMAL LOW (ref 8.9–10.3)
Chloride: 106 mmol/L (ref 98–111)
Creatinine, Ser: 0.66 mg/dL (ref 0.44–1.00)
GFR, Estimated: >60 mL/min (ref >60)
Glucose, Bld: 95 mg/dL (ref 70–99)
Potassium: 3.8 mmol/L (ref 3.5–5.1)
Sodium: 141 mmol/L (ref 135–145)
Total Bilirubin: 1 mg/dL (ref 0.0–1.2)
Total Protein: 6.6 g/dL (ref 6.5–8.1)

## 2024-09-25 LAB — CBC WITH DIFFERENTIAL/PLATELET
Abs Immature Granulocytes: 0.04 K/uL (ref 0.00–0.07)
Basophils Absolute: 0.1 K/uL (ref 0.0–0.1)
Basophils Relative: 1 %
Eosinophils Absolute: 0.7 K/uL — ABNORMAL HIGH (ref 0.0–0.5)
Eosinophils Relative: 9 %
HCT: 42.1 % (ref 36.0–46.0)
Hemoglobin: 13.7 g/dL (ref 12.0–15.0)
Immature Granulocytes: 1 %
Lymphocytes Relative: 17 %
Lymphs Abs: 1.2 K/uL (ref 0.7–4.0)
MCH: 29.1 pg (ref 26.0–34.0)
MCHC: 32.5 g/dL (ref 30.0–36.0)
MCV: 89.6 fL (ref 80.0–100.0)
Monocytes Absolute: 0.5 K/uL (ref 0.1–1.0)
Monocytes Relative: 7 %
Neutro Abs: 4.9 K/uL (ref 1.7–7.7)
Neutrophils Relative %: 65 %
Platelets: 280 K/uL (ref 150–400)
RBC: 4.7 MIL/uL (ref 3.87–5.11)
RDW: 15.1 % (ref 11.5–15.5)
WBC: 7.3 K/uL (ref 4.0–10.5)
nRBC: 0 % (ref 0.0–0.2)

## 2024-09-25 LAB — CK: Total CK: 26 U/L — ABNORMAL LOW (ref 38–234)

## 2024-09-25 LAB — TSH: TSH: 1.524 u[IU]/mL (ref 0.350–4.500)

## 2024-09-25 MED ORDER — HYDROCODONE-ACETAMINOPHEN 5-325 MG PO TABS
1.0000 | ORAL_TABLET | Freq: Four times a day (QID) | ORAL | Status: DC | PRN
  Administered 2024-09-29: 1 via ORAL
  Filled 2024-09-25: qty 1

## 2024-09-25 MED ORDER — DANTROLENE SODIUM 100 MG PO CAPS
100.0000 mg | ORAL_CAPSULE | Freq: Three times a day (TID) | ORAL | Status: DC
  Administered 2024-09-26 – 2024-09-27 (×7): 100 mg via ORAL
  Filled 2024-09-25 (×8): qty 1

## 2024-09-25 MED ORDER — ATORVASTATIN CALCIUM 10 MG PO TABS
10.0000 mg | ORAL_TABLET | Freq: Every day | ORAL | Status: DC
  Administered 2024-09-26 – 2024-10-05 (×10): 10 mg via ORAL
  Filled 2024-09-25 (×10): qty 1

## 2024-09-25 MED ORDER — CYCLOBENZAPRINE HCL 5 MG PO TABS
5.0000 mg | ORAL_TABLET | Freq: Three times a day (TID) | ORAL | Status: DC
  Administered 2024-09-26 – 2024-10-05 (×30): 5 mg via ORAL
  Filled 2024-09-25 (×30): qty 1

## 2024-09-25 MED ORDER — RIVAROXABAN 10 MG PO TABS
10.0000 mg | ORAL_TABLET | Freq: Every day | ORAL | Status: DC
  Administered 2024-09-26 – 2024-10-05 (×10): 10 mg via ORAL
  Filled 2024-09-25 (×10): qty 1

## 2024-09-25 MED ORDER — ARTIFICIAL TEARS OPHTHALMIC OINT: TOPICAL_OINTMENT | Freq: Three times a day (TID) | OPHTHALMIC | Status: DC | PRN

## 2024-09-25 MED ORDER — LOPERAMIDE HCL 2 MG PO CAPS
2.0000 mg | ORAL_CAPSULE | ORAL | Status: DC | PRN
  Administered 2024-09-26 – 2024-09-28 (×4): 2 mg via ORAL
  Filled 2024-09-25 (×4): qty 1

## 2024-09-25 MED ORDER — ACETAMINOPHEN 500 MG PO TABS
1000.0000 mg | ORAL_TABLET | Freq: Three times a day (TID) | ORAL | Status: DC
  Administered 2024-09-26 (×3): 1000 mg via ORAL
  Filled 2024-09-25 (×4): qty 2

## 2024-09-25 MED ORDER — ENSURE PLUS HIGH PROTEIN PO LIQD
237.0000 mL | Freq: Two times a day (BID) | ORAL | Status: DC
  Administered 2024-09-27 – 2024-10-04 (×15): 237 mL via ORAL
  Filled 2024-09-25 (×2): qty 237

## 2024-09-25 MED ORDER — ALENDRONATE SODIUM 70 MG PO TABS: 70.0000 mg | ORAL_TABLET | ORAL | Status: DC

## 2024-09-25 MED ORDER — GERHARDT'S BUTT CREAM
1.0000 | TOPICAL_CREAM | Freq: Two times a day (BID) | CUTANEOUS | Status: DC
  Filled 2024-09-25: qty 60

## 2024-09-25 MED ORDER — VITAMIN B-12 1000 MCG PO TABS
500.0000 ug | ORAL_TABLET | Freq: Every morning | ORAL | Status: DC
  Administered 2024-09-26 – 2024-10-05 (×10): 500 ug via ORAL
  Filled 2024-09-25 (×10): qty 1

## 2024-09-25 MED ORDER — FERROUS SULFATE 325 (65 FE) MG PO TABS
325.0000 mg | ORAL_TABLET | Freq: Every day | ORAL | Status: DC
  Administered 2024-09-26 – 2024-10-05 (×10): 325 mg via ORAL
  Filled 2024-09-25 (×10): qty 1

## 2024-09-25 NOTE — H&P (Incomplete)
 Date: 09/26/2024               Patient Name:  Kathy Owens MRN: [id number]  DOB: 04-Oct-1956 Age / Sex: 68 y.o., female   PCP: Owens Kathy Dragon, MD         Medical Service: Internal Medicine Teaching Service         Attending Physician: Dr. Mliss Owens      First Contact: Kathy Rossetti, MD}    Second Contact: Dr. Missy Sandhoff, MD         Pager Information: First Contact Pager: [phone]   Second Contact Pager: [phone]   SUBJECTIVE   Chief Complaint: Back pain  History of Present Illness: Kathy Owens is a 68 y.o. female with PMH of spastic cerebral palsy, chronic lumbar spinal stenosis, chronic pain syndrome, recent hospitalization 08/23/2024 for fall at home after early discharge from inpatient rehabilitation, recently treated singles in V1 (finished treatment 07/30/2024), HTN, hyperlipidemia, T2DM, anemia, CKD 3/mild azotemia, chronic diarrhea.  Presents to ED with leg weakness and back pain after sister tried to turn her while bathing and a loud pop was heard and felt in her back.  Patient noted decreased ability to move her legs after this incidence.  Since her last admission, she has felt decrease in strength with difference in L >R in upper and lower extremity.  She noted she is dependent now on her sister, no other nursing comes by.  Back pain is described as minimal.  Worsens when sitting up or rotating left or right.  Feels okay at rest.  Does not radiate to either leg.  Patient's primary concern is why she having worsening functionality: Her worry is dementia as her mother suffered from dementia and had worsening mobility as she aged.  Denies any chest pain, SOB, abdominal pain, dysuria, increased urinary frequency, new incontinence, numbness or tingling, decrease sensation in lower extremity, decreased appetite Endorses lower extremity weakness, lower back pain, slight wheeze with exertion, increased diarrheal stool (2 bowel movements per day versus usual 1).    Chart review:  -08/04/2024: Presented to [facility] 07/24/2024 with gradual weakness and progressive spasticity in bilateral lower extremities.  Functionality at baseline fluctuated and she was able to walk with intermittent use of wheelchair.  Dr. Lovvorn was consulted for eval of increased plasticity and increased weakness.  MRI spine revealed no acute abnormalities, chronic stenosis.  Neurology evaluated and recommended consideration of switching to spasmolytic medication clonazepam and consider Botox injections outpatient.  Follow-up with Dr. Emeline outpatient for Botox injection.  During rehab admission, OT noted that patient fatigues quickly, declined extended stay in rehab, recommended overall supervision for bathing/dressing, ongoing skilled OT and home health.  Physical therapy noted: Improved activity tolerance, increased range of motion, ability to compensate for deficits.  Recommended ongoing skilled PT services and home health with decline of extending stay. -08/23/2024: Patient admitted for fall during attempted transfer.  CT of lumbar spine and pelvis showed chronic degenerative stenosis consistent with prior imaging.  She said I think I went home too soon I cannot pick up my feet regarding previous admission mentioned above.  Physical exam showed chronic contractures of lower and upper extremities; spasticity noted; no acute deformity or tenderness.  4/5 strength in upper extremities, 2/5 in lower extremities limited by spasticity, intact sensation, unable to stand or transfer independently.  Discharged to [facility].  Mobility specialist at time of discharge noted requiring maximum assist with patient performing 25 - 49%.  ED Course: Labs significant for  - CMP: Creatinine 0.66, BUN 11, GFR >60, ALT 53.   - CBC: WBC 7.3, hemoglobin 13.7, MCV 89.6, RDW 15.1 - CK: 26 - TSH: 1.524 - Urinalysis: Rare bacteria, small hemoglobin Imaging  - CXR: No active disease -MRI  lumbar spine: No significant interval change.  Mild to severe central canal stenosis L1-L2.  Rightward disc protrusion L3-L4, broad-based disc protrusion L4-L5 Received N/A Consulted IMTS  Past Medical History PCP:  Owens Kathy Dragon, MD  spastic cerebral palsy-see medication below chronic lumbar spinal stenosis chronic pain syndrome-see medication below recently treated singles in V1 -finished Valtrex  on 07/30/2024 HTN Hyperlipidemia-see medication below T2DM - A1c not documented in chart, no medication management. Anemia-see medication below CKD 3/mild azotemia chronic diarrhea-see medication below HTN-previous BP medication paused (triamterene -hydrochlorothiazide )  Meds: Patient reported:  Tylenol  as needed Alendronate  70 mg daily Atorvastatin  10 mg daily Dantrolene  100 mg 3 times daily Ferrous sulfate  325 mg Hydrocodone -acetaminophen  5-325 every 6 as needed Loperamide  2 mg as needed  Trazodone  50 mg-does not take Vitamin B12-does not take   Current Meds  Medication Sig   acetaminophen  (TYLENOL ) 500 MG tablet Take 500 mg by mouth every 6 (six) hours as needed (Pain).   alendronate  (FOSAMAX ) 70 MG tablet Take 1 tablet (70 mg total) by mouth every 7 (seven) days. Take with a full glass of water on an empty stomach. (Patient taking differently: Take 70 mg by mouth every Monday.)   atorvastatin  (LIPITOR) 10 MG tablet TAKE 1 TABLET(10 MG) BY MOUTH DAILY (Patient taking differently: Take 10 mg by mouth in the morning.)   cyanocobalamin  (VITAMIN B12) 500 MCG tablet Take 500 mcg by mouth in the morning.   cyclobenzaprine  (FLEXERIL ) 5 MG tablet Take 1 tablet (5 mg total) by mouth 3 (three) times daily.   dantrolene  (DANTRIUM ) 100 MG capsule Take 1 capsule (100 mg total) by mouth 3 (three) times daily.   feeding supplement (BOOST HIGH PROTEIN) LIQD Take 237 mLs by mouth 2 (two) times daily between meals.   hydrocerin (EUCERIN) CREA Apply 1 Application topically 2 (two) times daily.    HYDROcodone -acetaminophen  (NORCO/VICODIN) 5-325 MG tablet Take 1 tablet by mouth every 6 (six) hours as needed for moderate pain (pain score 4-6) or severe pain (pain score 7-10).   loperamide  (IMODIUM ) 2 MG capsule TAKE 1 CAPSULE(2 MG) BY MOUTH DAILY FOR UP TO 360 DOSES AS NEEDED FOR DIARRHEA OR LOOSE STOOLS   rivaroxaban  (XARELTO ) 10 MG TABS tablet Take 1 tablet (10 mg total) by mouth daily.   traZODone  (DESYREL ) 50 MG tablet Take 1 tablet (50 mg total) by mouth at bedtime as needed for sleep. (Patient taking differently: Take 25 mg by mouth at bedtime as needed for sleep.)   [Paused] triamterene -hydrochlorothiazide  (MAXZIDE -25) 37.5-25 MG tablet Take 1 tablet by mouth daily.    Past Surgical History Past Surgical History:  Procedure Laterality Date   JOINT REPLACEMENT Right    Social:  Lives With: Sister Occupation: Unemployed Support: Sister Level of Function: Dependent for most ADLs and IADLs, specifically over the past couple of months-year.  Sister is caregiver.  About a year ago patient was more independent, now requires a for transferring.  Spends majority of time in wheelchair or bed.  Incontinent.  Able to feed herself with TV dinners.  Pays own bills.  Utilizes cell phone.  For meals, generally eats just breakfast and dinner (breakfast bowl, TV dinner, chips for snacks, denies soda/tea, 40 ounces of water daily)  Substances: -Tobacco: Denied -Alcohol : Denied -Recreational Drug: Denied  Family History:  Family History  Problem Relation Age of Onset   Heart failure Father        Died at the age of 11.  Was a smoker.   Alcohol  abuse Sister        She is 10 years younger than Ms. Gudgel     Allergies: Allergies as of 09/25/2024 - Review Complete 09/25/2024  Allergen Reaction Noted   Firvanq  [vancomycin ] Hives 11/06/2018   Ms contin [morphine] Other (See Comments) 07/10/2015   Lioresal  [baclofen ] Other (See Comments) 07/24/2024   Nsaids Other (See Comments) 07/24/2024    Zanaflex  [tizanidine ] Other (See Comments) 07/24/2024    Review of Systems: A complete ROS was negative except as per HPI.   OBJECTIVE:   Physical Exam: Blood pressure 116/74, pulse 99, temperature 97.8 F (36.6 C), temperature source Oral, resp. rate (!) 22, SpO2 98% on RA.  Physical Exam Constitutional:      General: She is not in acute distress.    Appearance: She is not ill-appearing or toxic-appearing.  HENT:     Head:     Comments: V1 pattern discoloration/ rash above left eye Eyes:     General: No visual field deficit.    Extraocular Movements: Extraocular movements intact.     Conjunctiva/sclera: Conjunctivae normal.     Pupils: Pupils are equal, round, and reactive to light.  Cardiovascular:     Rate and Rhythm: Normal rate and regular rhythm.     Heart sounds: Normal heart sounds. No murmur heard. Pulmonary:     Effort: Pulmonary effort is normal.     Breath sounds: Normal breath sounds. No stridor. No wheezing, rhonchi or rales.  Abdominal:     Palpations: Abdomen is soft.     Tenderness: There is no abdominal tenderness. There is no guarding.  Musculoskeletal:     Lumbar back: Tenderness (Pain to palpation along the bilateral sacroiliac.  No signs of trauma) present.     Right lower leg: No edema.     Left lower leg: No edema.     Comments: Negative for spasticity  Skin:    General: Skin is warm and dry.  Neurological:     Mental Status: She is alert and oriented to person, place, and time.     Cranial Nerves: Dysarthria present. No cranial nerve deficit or facial asymmetry.     Sensory: Sensation is intact. No sensory deficit.     Motor: Weakness present. No tremor.     Deep Tendon Reflexes: Reflexes abnormal. Babinski sign absent on the right side. Babinski sign absent on the left side.     Reflex Scores:      Patellar reflexes are 0 on the right side and 0 on the left side.    Comments: 4/5 right upper extremity, 5/5 left upper extremity, 2-3/5 right  and left lower extremity hip and knee flexion (unable to lift heel off of bed).        Labs: CBC    Component Value Date/Time   WBC 7.3 09/25/2024 1456   RBC 4.70 09/25/2024 1456   HGB 13.7 09/25/2024 1456   HGB 13.8 05/26/2019 1120   HCT 42.1 09/25/2024 1456   HCT 43.0 05/26/2019 1120   PLT 280 09/25/2024 1456   PLT 270 05/26/2019 1120   MCV 89.6 09/25/2024 1456   MCV 91 05/26/2019 1120   MCH 29.1 09/25/2024 1456   MCHC 32.5 09/25/2024 1456   RDW  15.1 09/25/2024 1456   RDW 13.6 05/26/2019 1120   LYMPHSABS 1.2 09/25/2024 1456   LYMPHSABS 1.3 05/26/2019 1120   MONOABS 0.5 09/25/2024 1456   EOSABS 0.7 (H) 09/25/2024 1456   EOSABS 0.1 05/26/2019 1120   BASOSABS 0.1 09/25/2024 1456   BASOSABS 0.0 05/26/2019 1120     CMP     Component Value Date/Time   NA 141 09/25/2024 1456   NA 143 10/28/2023 1417   K 3.8 09/25/2024 1456   CL 106 09/25/2024 1456   CO2 25 09/25/2024 1456   GLUCOSE 95 09/25/2024 1456   BUN 11 09/25/2024 1456   BUN 35 (H) 10/28/2023 1417   CREATININE 0.66 09/25/2024 1456   CREATININE 1.12 (H) 05/10/2015 1157   CALCIUM  8.7 (L) 09/25/2024 1456   PROT 6.6 09/25/2024 1456   PROT 7.0 07/26/2020 0934   ALBUMIN 3.5 09/25/2024 1456   ALBUMIN 4.7 07/26/2020 0934   AST 41 09/25/2024 1456   ALT 53 (H) 09/25/2024 1456   ALKPHOS 79 09/25/2024 1456   BILITOT 1.0 09/25/2024 1456   BILITOT 0.5 07/26/2020 0934   GFRNONAA >60 09/25/2024 1456   GFRNONAA 54 (L) 05/10/2015 1157   GFRAA 73 07/26/2020 0934   GFRAA 62 05/10/2015 1157    Imaging: MR LUMBAR SPINE WO CONTRAST Addendum Date: 09/25/2024  ADDENDUM #1 ADDENDUM: New first impression should state: No significant interval change ---------------------------------------------------- Electronically signed by: Lonni Necessary MD 09/25/2024 06:15 PM EST RP Workstation: [id number]   Result Date: 09/25/2024 ORIGINAL REPORT EXAM: MRI LUMBAR SPINE 09/25/2024 05:51:31 PM TECHNIQUE: Multiplanar multisequence MRI of  the lumbar spine was performed without the administration of intravenous contrast. COMPARISON: MRI of lumbar spine 07/28/2024. CLINICAL HISTORY: Laying in bed today, patient felt a pop in her back when an aide attempted to pick her up. Patient now reports inability to raise either leg off the bed and worsened back pain. FINDINGS: BONES AND ALIGNMENT: Normal alignment. Normal vertebral body heights. Heterogeneous fatty infiltration in the marrow is again noted. Hemangioma at L1 is stable. SPINAL CORD: The conus medullaris terminates at L1. SOFT TISSUES: No paraspinal mass. L1-L2: Broad based disc protrusion and moderate facet hypertrophy at L1-L2 crowds the nerve roots with moderate to severe central canal stenosis, similar to the prior exam. L2-L3: Broad based disc bulge is present at L2-L3 without significant stenosis or change. L3-L4: Rightward disc protrusion is present at L3-L4. Mild subarticular narrowing is worse on the left. Moderate right and mild left foraminal stenosis is present. L4-L5: New broad based disc protrusion at L4-L5 is stable. Advanced asymmetric left sided facet hypertrophy is stable. Mild left greater than right subarticular and foraminal stenosis is present and stable. L5-S1: No significant disc herniation. No spinal canal stenosis or neural foraminal narrowing. IMPRESSION: 1. There is significant interval change. 2. Moderate to severe central canal stenosis at L1-L2 due to broad-based disc protrusion and moderate facet hypertrophy, unchanged. 3. Rightward disc protrusion at L3-L4 with mild subarticular narrowing greater on the left and moderate right/mild left foraminal stenosis. 4. Broad-based disc protrusion at L4-L5 with advanced asymmetric left-sided facet hypertrophy and mild left greater than right subarticular and foraminal stenosis, unchanged. Electronically signed by: Lonni Necessary MD 09/25/2024 06:04 PM EST RP Workstation: [id number]   DG Chest Portable 1 View Result  Date: 09/25/2024 CLINICAL DATA:  Fatigue, back pain, failure to thrive. EXAM: PORTABLE CHEST 1 VIEW COMPARISON:  07/23/2024. FINDINGS: The heart size and mediastinal contours are within normal limits. Minimal subsegmental atelectasis or scarring is noted  at the left lung base. No consolidation, effusion, or pneumothorax is seen. No acute osseous abnormality. IMPRESSION: No active disease. Electronically Signed   By: Leita Birmingham M.D.   On: 09/25/2024 17:27     EKG: personally reviewed my interpretation is sinus rhythm.   ASSESSMENT & PLAN:   Assessment & Plan by Problem: Principal Problem:   Weakness of both lower extremities Active Problems:   Cerebral palsy (HCC)   Chronic pain due to cerebral palsy   Chronic diarrhea of unknown origin   Pressure injury of skin   Mozelle K Berkley is a 68 y.o. person living with a history of spastic cerebral palsy, chronic lumbar spinal stenosis, chronic pain syndrome, recent hospitalization 08/23/2024 for fall at home after early discharge from inpatient rehabilitation, recently treated singles in V1 (finished treatment 07/30/2024), HTN, hyperlipidemia, T2DM, anemia, CKD 3/mild azotemia, chronic diarrhea who presented with lower back pain and admitted for weakness of both lower extremities on hospital day 0  Weakness of both lower extremities  Cerebral palsy Based on previous chart review, weakness appears stable from previous hospitalizations.  No new red flag signs such as numbness or tingling, new bladder/bowel incontinence, radiation of pain.  Imaging is also appears stable and during previous hospitalization patient denied wanting neurosurgery intervention for spinal stenosis.  Did not appreciate spasticity during evaluation, so this has improved from previous.  Would benefit from rehabilitation with PT/OT and SNF.  Regarding concerns per patient, discussed this is unlikely from dementia and she has denied new/worsening memory concerns.  Home medication  for spasticity continued (see below) - PT/OT ordered  Back pain Chronic lumbar spinal stenosis Chronic pain syndrome Mechanism of injury suggest back spasm from change in position.  MRI negative for acute changes.  Patient denied debilitating pain.  Physical exam unremarkable aside from tenderness along the sacroiliac joints bilaterally.  ROS unconcerning for cauda equina.  Will manage medically with home medications and hope to see improvement in lower extremity weakness. - Acetaminophen  1000 mg every 8 hours - Flexeril  5 mg 3 times daily - Dantrolene  100 mg 3 times daily - Hydrocodone -acetaminophen  5-3 25 every 6 as needed - PT/OT ordered  Nutrition For meals, generally eats just breakfast and dinner (breakfast bowl, TV dinner, chips for snacks, denies soda/tea, 40 ounces of water daily).  Encouraged increased p.o. intake to help with building strength. -Ensures ordered  Diarrhea Having increased diarrheal bowel movements from 1 soft stool due to soft stools/day.  Denies abdominal pain.  Due to quantity, unlikely infectious.  Suffers from chronic diarrhea.  Has been incontinent for several years, unlikely related to worsening back pain. Continue home medication management. - Continue loperamide  2 mg as needed  *Stage 2 Pressure injury - Right buttock  Not seen during physical exam, noted by nursing on floor. Stage 2 pressure injury right buttock  - Wound Care consulted    Chronic conditions T2DM Unable to see previous A1c.  Unclear if diabetic.  Not on any current medications.  Consider repeat A1c outpatient.  CKD 3 History of mild azotemia Creatinine 0.66 (baseline appears around 0.9).  BUN last documented 08/26/2024 34.  BUN today 11.  History of azotemia, however appears resolved.  GFR documented >50-<60 from 06/2021-03/2023.  GFR today 61.  HTN BP 133/77.  Not currently on medication management.  Hyperlipidemia Medically managed with atorvastatin  10 mg - Continue  atorvastatin   Anemia CBC unremarkable, no evidence of anemia during this hospitalization at this time. - Continue ferrous sulfate  325 mg  daily - Continue B12 500 mcg daily  Best practice: Diet: Normal VTE: Xarelto  IVF: None,None Code: Full  Disposition planning: Prior to Admission Living Arrangement: SNF,   Anticipated Discharge Location: SNF Dispo: Admit patient to Observation with expected length of stay less than 2 midnights.  Signed: Benuel Braun, DO Internal Medicine Resident  09/26/2024, 2:22 AM  On Call pager: [phone]

## 2024-09-25 NOTE — Hospital Course (Addendum)
°#  Spastic cerebral palsy #Progressive weakness of bilateral lower extremities #Chronic back pain #Lumbar spinal stenosis History of progressive weakness over several months, presenting after a transfer assisted by her sister during which she felt a pop in her back with spinal rotation/twisting, followed by increased back pain and perceived worsening lower extremity weakness. MRI demonstrated no acute or new abnormalities, with only chronic spinal stenosis noted; Neurosurgery evaluated and noted L1-L2 stenosis unchanged from imaging two months prior and did not feel that radiographic findings explained her symptoms or that surgical decompression would provide clinical benefit. PT/OT recommended SNF placement, but this was denied after peer-to-peer review, and the patients sister is agreeable to home discharge with a hospital bed and [facility] lift ordered for assistance. - Continue multimodal pain control with cyclobenzaprine  5 mg TID, dantrolene  100 mg TID, and scheduled acetaminophen  1,000 mg TID.  Stage 2 pressure injury, right buttock: Evaluated by wound care; continued pressure offloading   Hyperlipidemia: Stable; continued atorvastatin  10 mg daily.

## 2024-09-25 NOTE — ED Provider Notes (Signed)
 [facility] AT [facility] Provider Note   CSN: [id number] Arrival date & time: 09/25/24  1349     Patient presents with: Failure To Thrive   Latessa K Birkland is a 68 y.o. female.  {Add pertinent medical, surgical, social history, OB history to YEP:67052} The history is provided by the patient and medical records. No language interpreter was used.  Back Pain Location:  Lumbar spine Quality:  Stabbing Radiates to:  R posterior upper leg and L posterior upper leg Pain severity:  Moderate Onset quality:  Sudden Timing:  Constant Progression:  Unchanged Chronicity:  Recurrent Context: twisting   Relieved by:  Nothing Worsened by:  Bending Ineffective treatments:  None tried Associated symptoms: leg pain and weakness   Associated symptoms: no abdominal pain, no bladder incontinence, no bowel incontinence, no chest pain, no dysuria, no fever, no headaches, no numbness and no tingling        Prior to Admission medications   Medication Sig Start Date End Date Taking? Authorizing Provider  alendronate  (FOSAMAX ) 70 MG tablet Take 1 tablet (70 mg total) by mouth every 7 (seven) days. Take with a full glass of water on an empty stomach. Patient taking differently: Take 70 mg by mouth every Monday. 05/12/24 05/12/25  Trudy Mliss Dragon, MD  artificial tears (LACRILUBE) OINT ophthalmic ointment Place into the left eye 3 (three) times daily as needed for dry eyes. 08/23/24   Jerilynn Daphne SAILOR, NP  atorvastatin  (LIPITOR) 10 MG tablet TAKE 1 TABLET(10 MG) BY MOUTH DAILY Patient taking differently: Take 10 mg by mouth daily after supper. 04/01/24   Trudy Mliss Dragon, MD  cyanocobalamin  (VITAMIN B12) 500 MCG tablet Take 500 mcg by mouth in the morning.    [provider]  cyclobenzaprine  (FLEXERIL ) 5 MG tablet Take 1 tablet (5 mg total) by mouth 3 (three) times daily. 08/23/24   Jerilynn Daphne SAILOR, NP  dantrolene  (DANTRIUM ) 100 MG capsule Take 1 capsule  (100 mg total) by mouth 3 (three) times daily. 08/23/24   Jerilynn Daphne SAILOR, NP  ferrous sulfate  325 (65 FE) MG tablet Take 1 tablet (325 mg total) by mouth daily with breakfast. 08/23/24   Jerilynn Daphne SAILOR, NP  hydrocerin (EUCERIN) CREA Apply 1 Application topically 2 (two) times daily. 08/26/24   Charmayne Holmes, DO  HYDROcodone -acetaminophen  (NORCO/VICODIN) 5-325 MG tablet Take 1 tablet by mouth every 6 (six) hours as needed for moderate pain (pain score 4-6) or severe pain (pain score 7-10). 09/20/24 10/20/24  Trudy Mliss Dragon, MD  loperamide  (IMODIUM ) 2 MG capsule TAKE 1 CAPSULE(2 MG) BY MOUTH DAILY FOR UP TO 360 DOSES AS NEEDED FOR DIARRHEA OR LOOSE STOOLS Patient taking differently: Take 2 mg by mouth as needed for diarrhea or loose stools. Take 1 tablet daily as needed for loose stools. 03/31/24   Trudy Mliss Dragon, MD  Nystatin (GERHARDT'S BUTT CREAM) CREA Apply 1 Application topically 2 (two) times daily. 08/23/24   Jerilynn Daphne SAILOR, NP  rivaroxaban  (XARELTO ) 10 MG TABS tablet Take 1 tablet (10 mg total) by mouth daily. 08/23/24   Jerilynn Daphne SAILOR, NP  traZODone  (DESYREL ) 50 MG tablet Take 1 tablet (50 mg total) by mouth at bedtime as needed for sleep. 08/23/24   Jerilynn Daphne SAILOR, NP  triamterene -hydrochlorothiazide  (MAXZIDE -25) 37.5-25 MG tablet Take 1 tablet by mouth daily. Patient not taking: Reported on 08/24/2024 06/16/24   Trudy Mliss Dragon, MD    Allergies: Firvanq  [vancomycin ], Ms contin [morphine], Lioresal  [baclofen ], Nsaids, and  Zanaflex  [tizanidine ]    Review of Systems  Constitutional:  Positive for fatigue. Negative for chills and fever.  HENT:  Negative for congestion.   Respiratory:  Negative for cough, chest tightness, shortness of breath and wheezing.   Cardiovascular:  Negative for chest pain and palpitations.  Gastrointestinal:  Negative for abdominal pain, bowel incontinence, constipation, nausea and vomiting.  Genitourinary:  Negative for bladder  incontinence, dysuria and flank pain.  Musculoskeletal:  Positive for back pain. Negative for neck pain and neck stiffness.  Skin:  Negative for rash and wound.  Neurological:  Positive for weakness. Negative for dizziness, tingling, light-headedness, numbness and headaches.  Psychiatric/Behavioral:  Negative for agitation and confusion.   All other systems reviewed and are negative.   Updated Vital Signs BP 132/72 (BP Location: Right Arm)   Pulse 80   Temp 97.9 F (36.6 C) (Oral)   Resp 14   SpO2 100%   Physical Exam Vitals and nursing note reviewed.  Constitutional:      General: She is not in acute distress.    Appearance: She is well-developed. She is not ill-appearing, toxic-appearing or diaphoretic.  HENT:     Head: Normocephalic and atraumatic.     Right Ear: External ear normal.     Left Ear: External ear normal.     Nose: Nose normal. No congestion or rhinorrhea.     Mouth/Throat:     Mouth: Mucous membranes are moist.     Pharynx: No oropharyngeal exudate or posterior oropharyngeal erythema.  Eyes:     Extraocular Movements: Extraocular movements intact.     Conjunctiva/sclera: Conjunctivae normal.     Pupils: Pupils are equal, round, and reactive to light.  Cardiovascular:     Pulses: Normal pulses.     Heart sounds: No murmur heard. Pulmonary:     Effort: No respiratory distress.     Breath sounds: No stridor. No wheezing, rhonchi or rales.  Chest:     Chest wall: No tenderness.  Abdominal:     General: Abdomen is flat. There is no distension.     Tenderness: There is no abdominal tenderness. There is no rebound.  Musculoskeletal:        General: Tenderness present.     Cervical back: Normal range of motion and neck supple. No tenderness.     Comments: Tenderness across low back.  Weakness in both legs and she cannot lift them off the bed which she reportedly can do several days ago.  She has reportedly intact sensation and I felt decreased reflexes on  patellar reflexes bilaterally.  Skin:    General: Skin is warm.     Capillary Refill: Capillary refill takes less than 2 seconds.     Findings: No erythema or rash.  Neurological:     Mental Status: She is alert.     Sensory: No sensory deficit.     Motor: Weakness present. No abnormal muscle tone.     Deep Tendon Reflexes: Reflexes are normal and symmetric.     (all labs ordered are listed, but only abnormal results are displayed) Labs Reviewed  COMPREHENSIVE METABOLIC PANEL WITH GFR  CBC WITH DIFFERENTIAL/PLATELET  URINALYSIS, ROUTINE W REFLEX MICROSCOPIC  CK    EKG: None  Radiology: No results found.  {Document cardiac monitor, telemetry assessment procedure when appropriate:32947} Procedures   Medications Ordered in the ED - No data to display    {Click here for ABCD2, HEART and other calculators REFRESH Note before signing:1}  Medical Decision Making Amount and/or Complexity of Data Reviewed Labs: ordered. Radiology: ordered.    Lashayla K Gutter is a 68 y.o. female with a past medical history significant for hypertension, hyperlipidemia, cerebral palsy, lumbar radiculopathy and chronic back pain, and osteoporosis who presents with leg weakness and back pain.  According to patient, she has been out of the hospital for the last few months and was recently discharged from a rehab facility.  She reports that today, a caregiver was trying to help roll and bathe her but the patient felt a loud pop in her back and had sudden onset of severely worsened pain and now has worsened weakness in both legs.  She says that several days ago she could raise and off the bed but now she cannot lift them off the bed at all.  She denies any focal numbness but reports the pain goes from her back down her legs bilaterally.  She denies any other fevers, chills, congestion, cough, nausea, vomiting, constipation, diarrhea, or urinary changes.  She reports the pain is  moderate.  On exam, lungs clear.  Chest nontender.  Abdomen nontender.  Patient can move both arms and has symmetric grip strength.  Intact sensation and pulse of the upper extremities.  Lower extremity she could not lift off the bed on either side.  She does have intact sensation and just I did feel pulses.  I attempted reflexes and had difficult time getting patellar reflexes on either side even using a large reflex hammer.  With this sudden pop in her back with worsened pain and now bilateral leg weakness, in the setting of known spinal stenosis, I am concerned about worsening injury.  Will get MRI of her back with the nerve changes and weakness and will get other screening workup to look for other causes of fatigue.  Anticipate reassessment after workup to determine disposition.  7:46 PM MRI returned without acute change causing the patient's pop and worsened symptoms.  I suspect when she twisted she may have caused some muscle spasm or shifting that is exacerbating her symptoms however she cannot raise her legs off the bed which is much worse for her.  Thus, patient tells me she does not think she can go home.  As she was just admitted and worked up for similar symptoms, will call for readmission to the teaching service for further management.    {Document critical care time when appropriate  Document review of labs and clinical decision tools ie CHADS2VASC2, etc  Document your independent review of radiology images and any outside records  Document your discussion with family members, caretakers and with consultants  Document social determinants of health affecting pt's care  Document your decision making why or why not admission, treatments were needed:32947:::1}   Final diagnoses:  None    ED Discharge Orders     None

## 2024-09-25 NOTE — ED Notes (Signed)
 Pt reports she just voided in brief and cleaned up, pt reports she is able to tell when she needs to void, pt placed on bedpan to attempt to collect urine sample.

## 2024-09-25 NOTE — ED Triage Notes (Signed)
 Pt arrives via EMS from home with reports of failure to thrive. Pt just discharged from [facility] on 12/5. Pt has been stuck in bed since. Sister unable to take care of pt and attempted to clean her today but hurt her back. Pt is total care. EMS reports pt soaked in urine.

## 2024-09-25 NOTE — ED Notes (Signed)
PT remains in MRI

## 2024-09-25 NOTE — ED Notes (Addendum)
PT transported to MRI

## 2024-09-26 ENCOUNTER — Other Ambulatory Visit: Payer: Self-pay

## 2024-09-26 ENCOUNTER — Ambulatory Visit: Payer: Self-pay | Admitting: Student

## 2024-09-26 DIAGNOSIS — M6281 Muscle weakness (generalized): Secondary | ICD-10-CM | POA: Diagnosis not present

## 2024-09-26 DIAGNOSIS — L8990 Pressure ulcer of unspecified site, unspecified stage: Secondary | ICD-10-CM | POA: Insufficient documentation

## 2024-09-26 MED ORDER — GERHARDT'S BUTT CREAM
TOPICAL_CREAM | Freq: Three times a day (TID) | CUTANEOUS | Status: DC
  Administered 2024-09-28: 1 via TOPICAL
  Filled 2024-09-26: qty 60

## 2024-09-26 NOTE — Care Management Obs Status (Signed)
 MEDICARE OBSERVATION STATUS NOTIFICATION   Patient Details  Name: Kathy Owens MRN: [id number] Date of Birth: 01/19/1956   Medicare Observation Status Notification Given:  Yes  Obs signed and copy given.  Tonya Carlile 09/26/2024, 2:44 PM

## 2024-09-26 NOTE — Evaluation (Signed)
 Physical Therapy Evaluation Patient Details Name: Kathy Owens MRN: [id number] DOB: 1956/04/11 Today's Date: 09/26/2024  History of Present Illness  PT is a 68 yo female admitted 12/7 following a pop in her back while sister was bathing her in bed and pt has LE weakness (chronic). Pt was in [facility] hospital s/p fall in Oct and was on acute rehab from 10/16-11/4. Pt went home after declining further rehab and readmitted same day as d/c after a fall.  Pt went to [facility] after that fall and was just d/c'd home 12/5 and now readmitted. MRI L1-2 canal stenosis and L3-4, L4-5 disc protrusion. PMH: spastic cerebral palsy, chronic pain syndrome, shingles, limbar spinal stenosis, CKD, T2DM, HTN, chronic diarrhea, anterior ischemic optic neuropathy.   Clinical Impression  Pt admitted with above diagnosis. Has not been ambulatory for what sounds like a couple of months but has high hopes of gain this independence again. She was able to stand and take steps in parallel bars at rehab but required hoyer lift for transfers apparently. She does not have a hoyer lift at home and has reportedly not been able to get out of bed. Patient was able to stand with mod assist from elevated surface using Stedy today. She has LE tone (hx of CP) and limited flexion of both knees (lt worse than rt.) Educated on repositioning/ pressure redistribution schedule and verbalized understanding. Very motivated. Would require rigorous rehab efforts to maximize function following d/c. Patient will benefit from continued inpatient follow up therapy, <3 hours/day. LTC may be an appropriate discussion if sister cannot provide adequate support even with all needed equipment. Pt currently with functional limitations due to the deficits listed below (see PT Problem List). Pt will benefit from acute skilled PT to increase their independence and safety with mobility to allow discharge.           If plan is discharge home, recommend the  following: Two people to help with walking and/or transfers;Two people to help with bathing/dressing/bathroom;Assistance with cooking/housework;Assist for transportation;Help with stairs or ramp for entrance   Can travel by private vehicle   No    Equipment Recommendations Hospital bed;Hoyer lift (If returning home* - Will need to ensure hoyer will fit.)  Recommendations for Other Services       Functional Status Assessment Patient has had a recent decline in their functional status and demonstrates the ability to make significant improvements in function in a reasonable and predictable amount of time.     Precautions / Restrictions Precautions Precautions: Fall Recall of Precautions/Restrictions: Impaired Precaution/Restrictions Comments: poor memory/historian Restrictions Weight Bearing Restrictions Per Provider Order: No Other Position/Activity Restrictions: Pt with significant contractures and increased tone      Mobility  Bed Mobility Overal bed mobility: Needs Assistance Bed Mobility: Rolling, Sidelying to Sit Rolling: Mod assist, Used rails Sidelying to sit: Max assist, HOB elevated, Used rails       General bed mobility comments: Mod assist to roll using bed pad, pt able to grasp rail with bil hands and pull to facilitate. Cues for technique, assist for LEs down from bed and max assist for trunk support to rise. +dizziness for about 30 seconds upon sitting, did not appreciate any obvious nystagmus but blinking frequently.    Transfers Overall transfer level: Needs assistance Equipment used: Ambulation equipment used Transfers: Sit to/from Stand, Bed to chair/wheelchair/BSC Sit to Stand: Mod assist, From elevated surface, Via lift equipment           General  transfer comment: Mod assist for boost to stand from elevated surface using Stedy following stepwise instructions.  Able to pull with Bil hands through horiziontal rail. Performed a second time from elevated  bed surface close to min assist level. Pt can rise from stedy paddles  with min-CGA level multiple times. Pillow between knees and stedy pad for comfort. Transfer via Lift Equipment: Stedy  Ambulation/Gait             Pre-gait activities: Weight shifting activity while standing in stedy with Min-CGA. for balance and feedback.    Stairs            Wheelchair Mobility     Tilt Bed    Modified Rankin (Stroke Patients Only)       Balance Overall balance assessment: Needs assistance Sitting-balance support: Feet supported, Single extremity supported Sitting balance-Leahy Scale: Poor Sitting balance - Comments: Holds stedy rail with supervision. Postural control: Posterior lean Standing balance support: Bilateral upper extremity supported, Reliant on assistive device for balance Standing balance-Leahy Scale: Poor Standing balance comment: BIL knees blocked with Stedy                             Pertinent Vitals/Pain Pain Assessment Pain Assessment: No/denies pain    Home Living Family/patient expects to be discharged to:: Private residence Living Arrangements: Other relatives (sister) Available Help at Discharge: Family;Available 24 hours/day Type of Home: Apartment Home Access: Level entry       Home Layout: One level Home Equipment: [employer] (2 wheels);Rollator (4 wheels);Shower seat;Grab bars - tub/shower;Hand held shower head;Wheelchair - power;Grab bars - toilet;BSC/3in1 Additional Comments: [employer] apartment    Prior Function Prior Level of Function : History of Falls (last six months);Patient poor historian/Family not available;Needs assist  Cognitive Assist : Mobility (cognitive);ADLs (cognitive) Mobility (Cognitive): Intermittent cues ADLs (Cognitive): Intermittent cues Physical Assist : Mobility (physical);ADLs (physical) Mobility (physical): Bed mobility;Transfers;Gait ADLs (physical):  Grooming;Bathing;Dressing;Toileting;IADLs Mobility Comments: Pt has not walked in months. Pt was hoyer lift transfer leaving [facility] but does not have hoyer lift at home. Pt in bed only for the three days she was home. ADLs Comments: Pt left [facility] place feeding self and assisting with grooming and UE bathing. Otherwise, pt was dependent for adls and has been since she was in rehab in Oct.     Extremity/Trunk Assessment   Upper Extremity Assessment Upper Extremity Assessment: Defer to OT evaluation    Lower Extremity Assessment Lower Extremity Assessment: LLE deficits/detail;RLE deficits/detail (Still unable to SLR agains gravity but was able to bear full weight through LEs with bil knee block to stand.) RLE Deficits / Details: Hx of cerebral palsy, able to bend Rt knee approx 90 degrees into flexion, increased tone, can DF/PF with ankle. LLE Deficits / Details: More restricted flexion on Lt than Rt with knee to approx 60 deg. Minimal ankle DF/PF noted.    Cervical / Trunk Assessment Cervical / Trunk Assessment: Kyphotic;Other exceptions Cervical / Trunk Exceptions: Pt with L1-2 canal stenosis and disk protrusion at L3-4 and L4-5  Communication   Communication Communication: Impaired Factors Affecting Communication: Hearing impaired    Cognition Arousal: Alert Behavior During Therapy: WFL for tasks assessed/performed   PT - Cognitive impairments: No family/caregiver present to determine baseline, Awareness                         Following commands: Intact  Cueing Cueing Techniques: Verbal cues     General Comments General comments (skin integrity, edema, etc.): Highly motiviated. RN changed sacral dressing while pt standing. Educated on importance of routine pressure relief and techniques q16mins.    Exercises General Exercises - Lower Extremity Ankle Circles/Pumps: AROM, AAROM, Both, 10 reps, Supine Quad Sets: Strengthening, Both, 5 reps, Seated    Assessment/Plan    PT Assessment Patient needs continued PT services  PT Problem List Decreased strength;Decreased range of motion;Decreased activity tolerance;Decreased balance;Decreased mobility;Decreased coordination;Decreased knowledge of use of DME;Impaired tone;Decreased skin integrity       PT Treatment Interventions DME instruction;Gait training;Functional mobility training;Therapeutic activities;Therapeutic exercise;Balance training;Neuromuscular re-education;Cognitive remediation;Patient/family education;Wheelchair mobility training;Manual techniques;Modalities    PT Goals (Current goals can be found in the Care Plan section)  Acute Rehab PT Goals Patient Stated Goal: Walk again PT Goal Formulation: With patient Time For Goal Achievement: 10/10/24 Potential to Achieve Goals: Fair    Frequency Min 2X/week     Co-evaluation               AM-PAC PT 6 Clicks Mobility  Outcome Measure Help needed turning from your back to your side while in a flat bed without using bedrails?: A Lot Help needed moving from lying on your back to sitting on the side of a flat bed without using bedrails?: A Lot Help needed moving to and from a bed to a chair (including a wheelchair)?: A Lot Help needed standing up from a chair using your arms (e.g., wheelchair or bedside chair)?: A Lot Help needed to walk in hospital room?: Total Help needed climbing 3-5 steps with a railing? : Total 6 Click Score: 10    End of Session Equipment Utilized During Treatment: Gait belt Activity Tolerance: Patient tolerated treatment well Patient left: in chair;with call bell/phone within reach;with chair alarm set;with nursing/sitter in room (MDs in room) Nurse Communication: Mobility status;Need for lift equipment PT Visit Diagnosis: Unsteadiness on feet (R26.81);Other abnormalities of gait and mobility (R26.89);Muscle weakness (generalized) (M62.81);History of falling (Z91.81);Difficulty in walking,  not elsewhere classified (R26.2);Other symptoms and signs involving the nervous system (R29.898)    Time: 8945-8878 PT Time Calculation (min) (ACUTE ONLY): 27 min   Charges:   PT Evaluation $PT Eval Low Complexity: 1 Low PT Treatments $Therapeutic Activity: 8-22 mins PT General Charges $$ ACUTE PT VISIT: 1 Visit         Kathy Owens, PT, DPT [facility]  Rehabilitation Services Physical Therapist Office: [phone] Website: [email or web address]   Kathy Owens 09/26/2024, 2:33 PM

## 2024-09-26 NOTE — Plan of Care (Signed)

## 2024-09-26 NOTE — Evaluation (Signed)
 Occupational Therapy Evaluation Patient Details Name: Kathy Owens Howser MRN: [id number] DOB: 04/07/1956 Today's Date: 09/26/2024   History of Present Illness   PT is a 68 yo female admitted for pop in her back while sister was bathing her in bed and pt has LE weakness (chronic). Pt was in [facility] hospital s/p fall in Oct and was on acute rehab from 10/16-11/4. Pt went home after declining further rehab and readmitted same day as d/c after a fall.  Pt went to [facility] after that fall and was just d/c'd home 12/5 and now readmitted. MRI L1-2 canal stenosis and L3-4, L4-5 disc protrusion. PMH: spastic cerebral palsy, chronic pain syndrome, shingles, limbar spinal stenosis, CKD, T2DM, HTN, chronic diarrhea, anterior ischemic optic neuropathy     Clinical Impressions Pt admitted with the above diagnosis and has the deficits outlined below. Pt would benefit from a trial of OT to attempt to increase independence with basic adls and an attempt to train the sister if pt is planning on d/cing home. Pt has had two attempts at rehab in last two months (one at [facility] and one at [facility]). On both occasions pt d/c'd home and came immediately back to the hospital. On recent d/c from SNF on 12/5, pt was hoyer lift transfer only but does not have a hoyer lift at home. Pt spent 3 days in the bed before returning to hospital with pop in back. If pt is to d/c home again, sister would need to come in for intense training but this training has not been able to take place during previous rehab stays before pt returning home.  Feel family many need to consider some sort of long term care facility given the last few months of decline in function.  Pt states sister is her only family left to assist.  Will continue to see with goals of hopefully training sister in use of of hoyer and maximizing pts independence with basic adls to decrease burden on sister. Pt is very motivated to assist but does not have awareness of how severe her deficits  remain.     If plan is discharge home, recommend the following:   A lot of help with bathing/dressing/bathroom;Assistance with cooking/housework;Assistance with feeding;Direct supervision/assist for medications management;Direct supervision/assist for financial management;Assist for transportation;Supervision due to cognitive status     Functional Status Assessment   Patient has had a recent decline in their functional status and demonstrates the ability to make significant improvements in function in a reasonable and predictable amount of time.     Equipment Recommendations   [employer]achers insurance and annuity association;Other (comment) (if room for hoyer and if pt goes homed)     Recommendations for Other Services         Precautions/Restrictions   Precautions Precautions: Fall Recall of Precautions/Restrictions: Impaired Precaution/Restrictions Comments: poor memory/historian Restrictions Weight Bearing Restrictions Per Provider Order: No Other Position/Activity Restrictions: Pt with significant contractures and increased tone     Mobility Bed Mobility Overal bed mobility: Needs Assistance Bed Mobility: Rolling, Supine to Sit, Sit to Supine Rolling: Mod assist   Supine to sit: Max assist, +2 for physical assistance Sit to supine: Max assist, Used rails   General bed mobility comments: Pt is very eager to assist but very limited requiring max assist to get to full sitting position. Used pad to pull pt to EOB.  Pt required assist to get both legs back into bed. Pt did assist with BUE to pull self up in bed.    Transfers  General transfer comment: Pt unabe to transfer at this time. Pt unable to stand. Has done hoyer transfers for last month at [facility]. Pt with pressure sore on bottom so scoot transfers may be difficult at this time.      Balance Overall balance assessment: Needs assistance Sitting-balance support: Feet supported Sitting balance-Leahy Scale: Poor    Postural control: Left lateral lean   Standing balance-Leahy Scale: Zero Standing balance comment: unable to stand                           ADL either performed or assessed with clinical judgement   ADL Overall ADL's : Needs assistance/impaired Eating/Feeding: Minimal assistance;Sitting   Grooming: Minimal assistance;Sitting Grooming Details (indicate cue type and reason): Pt requires assist with some set up and opening of small containers. Upper Body Bathing: Moderate assistance;Bed level   Lower Body Bathing: Total assistance;Bed level   Upper Body Dressing : Moderate assistance;Bed level   Lower Body Dressing: Total assistance;Bed level     Toilet Transfer Details (indicate cue type and reason): unable to transfer at this time Toileting- Clothing Manipulation and Hygiene: Total assistance;Bed level       Functional mobility during ADLs: Maximal assistance;+2 for physical assistance General ADL Comments: pt has declined significantly in last 2 months with adl independence. Pt very weak and unable to hold self up on EOB.     Vision Baseline Vision/History:  (pt has anterior ischemic optic neuropathy.) Ability to See in Adequate Light: 1 Impaired Patient Visual Report: No change from baseline Vision Assessment?: Vision impaired- to be further tested in functional context Additional Comments: Pt can find items in room without difficulty. Pt states her eyes fatigue quickly     Perception         Praxis         Pertinent Vitals/Pain Pain Assessment Pain Assessment: No/denies pain     Extremity/Trunk Assessment Upper Extremity Assessment Upper Extremity Assessment: Generalized weakness;RUE deficits/detail;LUE deficits/detail RUE Deficits / Details: Pt overall 4/5 strength. Pt with some contractures in fingers starting but is functional using B hands. RUE Sensation: WNL RUE Coordination: decreased fine motor;decreased gross motor LUE Deficits /  Details: Pt strength 4/5 overall. Pt lacks 15 degrees of elbow extension from old fracture.  Contractures starting in B hands LUE Sensation: WNL LUE Coordination: decreased fine motor;decreased gross motor   Lower Extremity Assessment Lower Extremity Assessment: Defer to PT evaluation   Cervical / Trunk Assessment Cervical / Trunk Assessment: Kyphotic;Other exceptions Cervical / Trunk Exceptions: Pt with L1-2 canal stenosis and disk protrusion at L3-4 and L4-5   Communication Communication Communication: Impaired Factors Affecting Communication: Hearing impaired   Cognition Arousal: Alert Behavior During Therapy: WFL for tasks assessed/performed Cognition: History of cognitive impairments, No family/caregiver present to determine baseline, Cognition impaired   Orientation impairments: Time Awareness: Intellectual awareness impaired, Online awareness impaired Memory impairment (select all impairments): Short-term memory, Non-declarative long-term memory, Declarative long-term memory, Working memory Attention impairment (select first level of impairment): Sustained attention Executive functioning impairment (select all impairments): Problem solving, Reasoning OT - Cognition Comments: Pt most likely at baseline with cognition. Pt does not have reasonable awareness of her deficits. Pt asked for therapist to step back so she could sit on EOB.  Pt was max assist to hold balance on EOB. At times pt could hold self for short spurts but then would fall to the left. Pt  said she is not interested in learning  to transfer, she just wants to walk again.                 Following commands: Intact       Cueing  General Comments   Cueing Techniques: Verbal cues  Pt very limited with all adls and mobility.   Exercises     Shoulder Instructions      Home Living Family/patient expects to be discharged to:: Private residence Living Arrangements: Other relatives (sister) Available Help  at Discharge: Family;Available 24 hours/day Type of Home: Apartment Home Access: Level entry     Home Layout: One level     Bathroom Shower/Tub: Walk-in shower;Sponge bathes at baseline   Bathroom Toilet: Handicapped height Bathroom Accessibility: Yes How Accessible: Accessible via wheelchair;Accessible via walker Home Equipment: Rolling Walker (2 wheels);Rollator (4 wheels);Shower seat;Grab bars - tub/shower;Hand held shower head;Wheelchair - power;Grab bars - toilet;BSC/3in1   Additional Comments: [employer] apartment      Prior Functioning/Environment Prior Level of Function : History of Falls (last six months);Patient poor historian/Family not available;Needs assist  Cognitive Assist : Mobility (cognitive);ADLs (cognitive) Mobility (Cognitive): Intermittent cues ADLs (Cognitive): Intermittent cues Physical Assist : Mobility (physical);ADLs (physical) Mobility (physical): Bed mobility;Transfers;Gait ADLs (physical): Grooming;Bathing;Dressing;Toileting;IADLs Mobility Comments: Pt has not walked in months. Pt was hoyer lift transfer leaving [facility] but does not have hoyer lift at home. Pt in bed only for the three days she was home. ADLs Comments: Pt left [facility] place feeding self and assisting with grooming and UE bathing. Otherwise, pt was dependent for adls and has been since she was in rehab in Oct.    OT Problem List: Decreased strength;Decreased range of motion;Decreased activity tolerance;Impaired balance (sitting and/or standing);Impaired vision/perception;Decreased coordination;Decreased cognition;Decreased safety awareness;Decreased knowledge of use of DME or AE;Decreased knowledge of precautions;Impaired tone;Impaired UE functional use   OT Treatment/Interventions: Self-care/ADL training;Patient/family education;DME and/or AE instruction      OT Goals(Current goals can be found in the care plan section)   Acute Rehab OT Goals Patient Stated Goal: to walk  again OT Goal Formulation: With patient Time For Goal Achievement: 10/10/24 Potential to Achieve Goals: Fair ADL Goals Pt Will Perform Eating: with set-up;sitting Pt Will Perform Grooming: with set-up;sitting Pt Will Perform Upper Body Bathing: with set-up;sitting Pt Will Perform Upper Body Dressing: with set-up;sitting Additional ADL Goal #1: Pt will sit on EOB for 5 minutes during grooming task with supervision for balance. Additional ADL Goal #2: If pt is d/cding home, sister will come in to show independence with use of hoyer lift   OT Frequency:  Min 1X/week    Co-evaluation              AM-PAC OT 6 Clicks Daily Activity     Outcome Measure Help from another person eating meals?: A Little Help from another person taking care of personal grooming?: A Little Help from another person toileting, which includes using toliet, bedpan, or urinal?: Total Help from another person bathing (including washing, rinsing, drying)?: A Lot Help from another person to put on and taking off regular upper body clothing?: A Lot Help from another person to put on and taking off regular lower body clothing?: Total 6 Click Score: 12   End of Session Nurse Communication: Mobility status;Need for lift equipment  Activity Tolerance: Patient limited by fatigue Patient left: in bed;with call bell/phone within reach;with bed alarm set  OT Visit Diagnosis: Other abnormalities of gait and mobility (R26.89);History of falling (Z91.81);Adult, failure to thrive (R62.7);Other symptoms and  signs involving cognitive function;Other symptoms and signs involving the nervous system (R29.898)                Time: 9164-9091 OT Time Calculation (min): 33 min Charges:  OT General Charges $OT Visit: 1 Visit OT Evaluation $OT Eval Moderate Complexity: 1 Mod OT Treatments $Self Care/Home Management : 8-22 mins  Joshua Silvano Dragon 09/26/2024, 9:37 AM

## 2024-09-26 NOTE — Consult Note (Addendum)
 WOC Nurse Consult Note: Reason for Consult: R buttocks wound  Wound type: Stage 2 Pressure injury R buttock red moist  Pressure Injury POA: Yes Measurement: see nursing flowsheet  Wound bed: red moist  Drainage (amount, consistency, odor) serosanguinous  Periwound: moisture associated skin damage with erythema and scattered partial thickness skin loss (Gerhardt's has been ordered by primary MD).  Per bedside nurse patient having diarrhea  Dressing procedure/placement/frequency: Cleanse buttocks with soap and water, apply silicone foam to R buttock wound then apply Gerhardt's Butt cream to remaining intact skin of sacrum/buttocks 3 times a day and prn soiling.   POC discussed with bedside nurse. Appreciate K. Evern, RN assistance with this consult. WOC team will not follow. Reconsult if further needs arise.   Thank you,    Powell Bar MSN, RN-BC, [employer]

## 2024-09-26 NOTE — Progress Notes (Signed)
 HD#0 SUBJECTIVE:  Patient Summary:  Kathy Owens is a 68 y.o. 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   Overnight Events: Admitted to hospital  Interim History: The patient was seen and examined sitting up in the chair.  She was in good spirits however is very concerned that she cannot walk as she used to.  She is interested in neurosurgery evaluation and potential surgical  involvement and improvement afterwards.  All questions and concerns were addressed at this time.  OBJECTIVE:  Vital Signs: Vitals:   09/25/24 2200 09/26/24 0114 09/26/24 0405 09/26/24 0750  BP:  (!) 142/76 134/72 111/62  Pulse:  86 80 78  Resp:  18  18  Temp:  97.7 F (36.5 C) 97.6 F (36.4 C) 97.8 F (36.6 C)  TempSrc:  Oral Oral Oral  SpO2:  97% 98% 97%  Weight: 71.2 kg 71.2 kg    Height:  5' (1.524 m)     Supplemental O2: Room Air SpO2: 97 %  Filed Weights   09/25/24 2200 09/26/24 0114  Weight: 71.2 kg 71.2 kg    No intake or output data in the 24 hours ending 09/26/24 1539 Net IO Since Admission: No IO data has been entered for this period [09/26/24 1539]  Physical Exam: Const: Awake, alert in NAD HENT: Normocephalic, atraumatic, mucus membranes moist Card: RRR, No MRG Resp: LCTAB, no increased work of breathing Abd: Soft, NTND, Bsx4 Extremities: Warm, pink, chronically edematous LLE 2/2 surgery.  Neuro: Minimal spasticity noted in RLE, no clonus.    Patient Lines/Drains/Airways Status     Active Line/Drains/Airways     Name Placement date Placement time Site Days   Peripheral IV 09/26/24 Anterior;Right Forearm  09/26/24  0000  Forearm  less than 1   Wound 08/04/24 2057 Irritant Contact Dermatitis Perineum Medial 08/04/24  2057  Perineum  53   Wound 08/04/24 2057 Irritant Contact Dermatitis Buttocks Medial 08/04/24  2057  Buttocks  53   Wound 09/26/24 0121 Pressure Injury Buttocks Right Stage 2 -  Partial thickness loss of dermis presenting as a shallow open injury with a red, pink wound bed without slough. 09/26/24  0121  Buttocks  less than 1            Pertinent labs and imaging:     Latest Ref Rng & Units 09/25/2024    2:56 PM 08/23/2024    9:58 PM 08/22/2024    4:33 AM  CBC  WBC 4.0 - 10.5 K/uL 7.3  8.4  5.8   Hemoglobin 12.0 - 15.0 g/dL 86.2  86.8  88.7   Hematocrit 36.0 - 46.0 % 42.1  40.0  34.7   Platelets 150 - 400 K/uL 280  227  184        Latest Ref Rng & Units 09/25/2024    2:56 PM 08/26/2024    3:30 AM 08/23/2024    9:58 PM  CMP  Glucose 70 - 99 mg/dL 95  97  96   BUN 8 - 23 mg/dL 11  34  28   Creatinine 0.44 - 1.00 mg/dL 9.33  9.19  9.06   Sodium 135 - 145 mmol/L 141  137  137   Potassium  3.5 - 5.1 mmol/L 3.8  3.6  3.8   Chloride 98 - 111 mmol/L 106  106  105   CO2 22 - 32 mmol/L 25  18  21    Calcium  8.9 - 10.3 mg/dL 8.7  8.3  9.2   Total Protein 6.5 - 8.1 g/dL 6.6   6.7   Total Bilirubin 0.0 - 1.2 mg/dL 1.0   1.0   Alkaline Phos 38 - 126 U/L 79   54   AST 15 - 41 U/L 41   23   ALT 0 - 44 U/L 53   19     MR LUMBAR SPINE WO CONTRAST Addendum Date: 09/25/2024 ** ADDENDUM #1 ** ADDENDUM: New first impression should state: No significant interval change ---------------------------------------------------- Electronically signed by: Lonni Necessary MD 09/25/2024 06:15 PM EST RP Workstation: [id number]   Result Date: 09/25/2024 ** ORIGINAL REPORT **EXAM: MRI LUMBAR SPINE 09/25/2024 05:51:31 PM TECHNIQUE: Multiplanar multisequence MRI of the lumbar spine was performed without the administration of intravenous contrast. COMPARISON: MRI of lumbar spine 07/28/2024. CLINICAL  HISTORY: Laying in bed today, patient felt a pop in her back when an aide attempted to pick her up. Patient now reports inability to raise either leg off the bed and worsened back pain. FINDINGS: BONES AND ALIGNMENT: Normal alignment. Normal vertebral body heights. Heterogeneous fatty infiltration in the marrow is again noted. Hemangioma at L1 is stable. SPINAL CORD: The conus medullaris terminates at L1. SOFT TISSUES: No paraspinal mass. L1-L2: Broad based disc protrusion and moderate facet hypertrophy at L1-L2 crowds the nerve roots with moderate to severe central canal stenosis, similar to the prior exam. L2-L3: Broad based disc bulge is present at L2-L3 without significant stenosis or change. L3-L4: Rightward disc protrusion is present at L3-L4. Mild subarticular narrowing is worse on the left. Moderate right and mild left foraminal stenosis is present. L4-L5: New broad based disc protrusion at L4-L5 is stable. Advanced asymmetric left sided facet hypertrophy is stable. Mild left greater than right subarticular and foraminal stenosis is present and stable. L5-S1: No significant disc herniation. No spinal canal stenosis or neural foraminal narrowing. IMPRESSION: 1. There is significant interval change. 2. Moderate to severe central canal stenosis at L1-L2 due to broad-based disc protrusion and moderate facet hypertrophy, unchanged. 3. Rightward disc protrusion at L3-L4 with mild subarticular narrowing greater on the left and moderate right/mild left foraminal stenosis. 4. Broad-based disc protrusion at L4-L5 with advanced asymmetric left-sided facet hypertrophy and mild left greater than right subarticular and foraminal stenosis, unchanged. Electronically signed by: Lonni Necessary MD 09/25/2024 06:04 PM EST RP Workstation: [id number]   DG Chest Portable 1 View Result Date: 09/25/2024 CLINICAL DATA:  Fatigue, back pain, failure to thrive. EXAM: PORTABLE CHEST 1 VIEW COMPARISON:  07/23/2024. FINDINGS: The  heart size and mediastinal contours are within normal limits. Minimal subsegmental atelectasis or scarring is noted at the left lung base. No consolidation, effusion, or pneumothorax is seen. No acute osseous abnormality. IMPRESSION: No active disease. Electronically Signed   By: Leita Birmingham M.D.   On: 09/25/2024 17:27    ASSESSMENT/PLAN:  Assessment: Principal Problem:   Weakness of both lower extremities Active Problems:   Cerebral palsy (HCC)   Chronic pain due to cerebral palsy   Chronic diarrhea of unknown origin   Pressure injury of skin   Plan: #Spastic cerebral palsy #Progressive weakness of bilateral lower extremities #Chronic back pain #Lumbar spinal stenosis -The patient has been admitted to the hospital multiple times over the last  few months with increasing weakness in the bilateral lower extremities.  -This has been thought to be 2/2 increased muscle relaxants (dantrolene ), a shingles infection, and lumbar stenosis found on MRI. She has been to inpatient rehab multiple times with adjustment to medications. She has also been evaluated by neurosurgery and was not interested in surgical options.  -Today, she is feeling about the same and is interested in surgery -NSGY evaluated and recommended to NOT proceed with sx as it will not likely help her symptoms.  -I suspect this is likely her new baseline unfortunately. -I do think this patient would benefit from continued physical therapy with SNF or CIR -Continue pain management of Tylenol  1000mg  TID, flexeril  5mg  TID, Norco 5-325 q6 PRN -Continue dantrolene  100mg  TID -PT/OT  #Stage 2 pressure injury-R buttock -Wound care consulted -Continue pressure offloading -In setting of incontinence, ensure area stays clean    Best Practice: Diet: Regular diet IVF: Fluids: none, Rate: None VTE: rivaroxaban  (XARELTO ) tablet 10 mg Start: 09/26/24 1000 Code: Full  Disposition planning: Therapy Recs: SNF, DME: other  pending DISPO: Pendings  Signature:  Schuyler Novak, DO [facility]  3:39 PM, 09/26/2024  On Call pager [phone]
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
## 2024-09-26 NOTE — Progress Notes (Signed)
 Wound pictures were uploaded and wound care nurse was notified.

## 2024-09-26 NOTE — Progress Notes (Addendum)
 Patient arrived from ED to the unit. Received report from Janell, [facility]. Assuming patient care now. Patient has no c/o at the moment, pleasant. Had 2 episodes of diarrhea upon arrival, prn Imodium  was administered. Call bell was placed within reach. Will continue to monitor.

## 2024-09-26 NOTE — ED Notes (Signed)
Called floor x2 no answer

## 2024-09-27 DIAGNOSIS — M6281 Muscle weakness (generalized): Secondary | ICD-10-CM | POA: Diagnosis not present

## 2024-09-27 MED ORDER — ACETAMINOPHEN 500 MG PO TABS
1000.0000 mg | ORAL_TABLET | Freq: Three times a day (TID) | ORAL | Status: DC | PRN
  Administered 2024-09-27 – 2024-10-04 (×4): 1000 mg via ORAL
  Filled 2024-09-27 (×5): qty 2

## 2024-09-27 NOTE — TOC Initial Note (Signed)
 Transition of Care [facility]) - Initial/Assessment Note    Patient Details  Name: Kathy Owens MRN: [id number] Date of Birth: [birth year]  [facility]) CM/SW Contact:    Sherline Clack, LCSWA Phone Number: 09/27/2024, 12:02 PM  Clinical Narrative:                  CSW submitted completed FL2 and sent out to facilities for SNF placement. Patient agreeable to SNF. CSW will continue to follow and update DC plan.   Expected Discharge Plan: Skilled Nursing Facility Barriers to Discharge: Insurance Authorization, SNF Pending bed offer   Patient Goals and CMS Choice     Choice offered to / list presented to : Patient      Expected Discharge Plan and Services       Living arrangements for the past 2 months: Single Family Home                                      Prior Living Arrangements/Services Living arrangements for the past 2 months: Single Family Home Lives with:: Self Patient language and need for interpreter reviewed:: Yes                 Activities of Daily Living   ADL Screening (condition at time of admission) Independently performs ADLs?: No Does the patient have a NEW difficulty with bathing/dressing/toileting/self-feeding that is expected to last >3 days?: Yes (Initiates electronic notice to provider for possible OT consult) Does the patient have a NEW difficulty with getting in/out of bed, walking, or climbing stairs that is expected to last >3 days?: Yes (Initiates electronic notice to provider for possible PT consult) Does the patient have a NEW difficulty with communication that is expected to last >3 days?: No Is the patient deaf or have difficulty hearing?: No Does the patient have difficulty seeing, even when wearing glasses/contacts?: No Does the patient have difficulty concentrating, remembering, or making decisions?: No  Permission Sought/Granted                  Emotional Assessment Appearance:: Appears  stated age Attitude/Demeanor/Rapport: Engaged Affect (typically observed): Appropriate Orientation: : Oriented to Self, Oriented to Place, Oriented to  Time, Oriented to Situation      Admission diagnosis:  Leg weakness, bilateral [R29.898] Weakness of both lower extremities [R29.898] Low back pain with bilateral sciatica, unspecified back pain laterality, unspecified chronicity [M54.42, M54.41] Patient Active Problem List   Diagnosis Date Noted   Pressure injury of skin 09/26/2024   Weakness of both lower extremities 09/25/2024   Fall 08/24/2024   Lumbar radiculopathy 08/24/2024   Coping style affecting medical condition 08/10/2024   Cerebral palsy, diplegic (HCC) 08/04/2024   Spinal stenosis in cervical region 07/28/2024   Rapidly progressive weakness 07/26/2024   Impaired mobility 07/24/2024   History of progressive weakness 07/24/2024   Zoster, V1 distribution without ocular involvement 07/24/2024   Need for home health care 07/06/2024   Chronic instability of right knee 06/16/2024   Chronic diarrhea of unknown origin 03/24/2024   Spasticity 01/25/2024   Corns and callosities 07/22/2022   Muscle spasticity 01/30/2021   Chronic use of opiate drug for therapeutic purpose 07/12/2020   Lactose intolerance in adult (presumptive dx) 07/12/2020   Porcelain gallbladder 11/04/2018   Non-arteritic AION (anterior ischemic optic neuropathy), bilateral 07/01/2017   Chronic pain due to cerebral palsy 12/13/2015   Pain  of right midfoot 05/10/2015   Vertigo, recurrent 07/06/2014   Subacromial impingement of right shoulder 08/10/2013   Health care maintenance 07/29/2011   Essential hypertension 02/15/2007   Age-related osteoporosis without current pathological fracture 02/15/2007   Hyperlipemia 10/09/2006   Cerebral palsy (HCC) 10/09/2006   Venous insufficiency of left leg 10/09/2006   PCP:  Trudy Mliss Dragon, MD Pharmacy:   [id number] - [place], [place] - [address] AT [facility] OF [address] [place] [address] Phone: [phone] Fax: [phone]  [facility] [address] [place] [address] Phone: [phone] Fax: [phone]     Social Drivers of Health (SDOH) Social History: SDOH Screenings   Food Insecurity: No Food Insecurity (09/26/2024)  Housing: Low Risk  (09/26/2024)  Transportation Needs: No Transportation Needs (09/26/2024)  Utilities: Not At Risk (09/26/2024)  Alcohol  Screen: Low Risk  (06/17/2023)  Depression (PHQ2-9): Low Risk  (05/05/2024)  Financial Resource Strain: Low Risk  (06/17/2023)  Physical Activity: Insufficiently Active (06/17/2023)  Social Connections: Socially Isolated (09/26/2024)  Stress: No Stress Concern Present (06/17/2023)  Tobacco Use: Low Risk  (09/25/2024)  Health Literacy: Adequate Health Literacy (06/17/2023)   SDOH Interventions:     Readmission Risk Interventions     No data to display

## 2024-09-27 NOTE — Progress Notes (Signed)
 HD#2 SUBJECTIVE:  Patient Summary:  Kathy Owens is a 68 y.o. 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   Overnight Events: NAE  Interim History: The pt was seen and examined at the bedside this morning. She is feeling well overall, but is very disappointed about her NSGY conversation yesterday. She continues to be frustrated about whether she will walk again.   OBJECTIVE:  Vital Signs: Vitals:   09/26/24 1729 09/26/24 1948 09/27/24 0511 09/27/24 0738  BP: 123/60 (!) 142/91 117/60 97/65  Pulse: 84 95 78 86  Resp: 18 18 18 18   Temp:   98.4 F (36.9 C)   TempSrc:   Oral   SpO2: 97% 97% 96% 97%  Weight:      Height:       Supplemental O2: Room Air SpO2: 97 %  Filed Weights   09/25/24 2200 09/26/24 0114  Weight: 71.2 kg 71.2 kg     Intake/Output Summary (Last 24 hours) at 09/27/2024 1143 Last data filed at 09/27/2024 0845 Gross per 24 hour  Intake 1196 ml  Output 500 ml  Net 696 ml   Net IO Since Admission: 932 mL [09/27/24 1143]  Physical Exam: Const: Awake, alert in NAD HENT: Normocephalic, atraumatic, mucus membranes moist Card: RRR, No MRG Resp: LCTAB, no increased work of breathing Abd: Soft, NTND, Bsx4 Extremities: Warm, pink, chronically edematous LLE 2/2 surgery.  Neuro: Minimal spasticity noted in RLE, no clonus.    Patient Lines/Drains/Airways Status     Active Line/Drains/Airways     Name Placement date Placement time Site Days   Peripheral IV 09/26/24 Anterior;Right Forearm 09/26/24  0000  Forearm  1   External Urinary Catheter 09/26/24  1400  --  1   Wound 08/04/24 2057 Irritant  Contact Dermatitis Perineum Medial 08/04/24  2057  Perineum  54   Wound 08/04/24 2057 Irritant Contact Dermatitis Buttocks Medial 08/04/24  2057  Buttocks  54   Wound 09/26/24 0121 Pressure Injury Buttocks Right Stage 2 -  Partial thickness loss of dermis presenting as a shallow open injury with a red, pink wound bed without slough. 09/26/24  0121  Buttocks  1            Pertinent labs and imaging:     Latest Ref Rng & Units 09/25/2024    2:56 PM 08/23/2024    9:58 PM 08/22/2024    4:33 AM  CBC  WBC 4.0 - 10.5 K/uL 7.3  8.4  5.8   Hemoglobin 12.0 - 15.0 g/dL 86.2  86.8  88.7   Hematocrit 36.0 - 46.0 % 42.1  40.0  34.7   Platelets 150 - 400 K/uL 280  227  184        Latest Ref Rng & Units 09/25/2024    2:56 PM 08/26/2024    3:30 AM 08/23/2024    9:58 PM  CMP  Glucose 70 - 99 mg/dL 95  97  96   BUN 8 - 23 mg/dL 11  34  28   Creatinine 0.44 - 1.00 mg/dL 9.33  9.19  9.06   Sodium 135 - 145 mmol/L 141  137  137   Potassium 3.5 - 5.1 mmol/L 3.8  3.6  3.8   Chloride 98 - 111 mmol/L 106  106  105   CO2 22 - 32 mmol/L 25  18  21    Calcium  8.9 - 10.3 mg/dL 8.7  8.3  9.2   Total Protein 6.5 - 8.1 g/dL 6.6   6.7   Total Bilirubin 0.0 - 1.2 mg/dL 1.0   1.0   Alkaline Phos 38 - 126 U/L 79   54   AST 15 - 41 U/L 41   23   ALT 0 - 44 U/L 53   19     MR LUMBAR SPINE WO CONTRAST Addendum Date: 09/25/2024 ** ADDENDUM #1 ** ADDENDUM: New first impression should state: No significant interval change ---------------------------------------------------- Electronically signed by: Lonni Necessary MD 09/25/2024 06:15 PM EST RP Workstation: [id number]   Result Date: 09/25/2024 ** ORIGINAL REPORT **EXAM: MRI LUMBAR SPINE 09/25/2024 05:51:31 PM TECHNIQUE: Multiplanar multisequence MRI of the lumbar spine was performed without the administration of intravenous contrast. COMPARISON: MRI of lumbar spine 07/28/2024. CLINICAL HISTORY: Laying in bed today, patient felt a pop in her back when an aide  attempted to pick her up. Patient now reports inability to raise either leg off the bed and worsened back pain. FINDINGS: BONES AND ALIGNMENT: Normal alignment. Normal vertebral body heights. Heterogeneous fatty infiltration in the marrow is again noted. Hemangioma at L1 is stable. SPINAL CORD: The conus medullaris terminates at L1. SOFT TISSUES: No paraspinal mass. L1-L2: Broad based disc protrusion and moderate facet hypertrophy at L1-L2 crowds the nerve roots with moderate to severe central canal stenosis, similar to the prior exam. L2-L3: Broad based disc bulge is present at L2-L3 without significant stenosis or change. L3-L4: Rightward disc protrusion is present at L3-L4. Mild subarticular narrowing is worse on the left. Moderate right and mild left foraminal stenosis is present. L4-L5: New broad based disc protrusion at L4-L5 is stable. Advanced asymmetric left sided facet hypertrophy is stable. Mild left greater than right subarticular and foraminal stenosis is present and stable. L5-S1: No significant disc herniation. No spinal canal stenosis or neural foraminal narrowing. IMPRESSION: 1. There is significant interval change. 2. Moderate to severe central canal stenosis at L1-L2 due to broad-based disc protrusion and moderate facet hypertrophy, unchanged. 3. Rightward disc protrusion at L3-L4 with mild subarticular narrowing greater on the left and moderate right/mild left foraminal stenosis. 4. Broad-based disc protrusion at L4-L5 with advanced asymmetric left-sided facet hypertrophy and mild left greater than right subarticular and foraminal stenosis, unchanged. Electronically signed by: Lonni Necessary MD 09/25/2024 06:04 PM EST RP Workstation: [id number]   DG Chest Portable 1 View Result Date: 09/25/2024 CLINICAL DATA:  Fatigue, back pain, failure to thrive. EXAM: PORTABLE CHEST 1 VIEW COMPARISON:  07/23/2024. FINDINGS: The heart size and mediastinal contours are within normal limits. Minimal  subsegmental atelectasis or scarring is noted at the left lung base. No consolidation, effusion, or pneumothorax is seen. No acute osseous abnormality. IMPRESSION: No active disease. Electronically Signed   By: Leita Birmingham M.D.   On: 09/25/2024 17:27    ASSESSMENT/PLAN:  Assessment: Principal Problem:   Weakness of both lower extremities Active Problems:   Cerebral palsy (HCC)   Chronic pain due to cerebral palsy   Chronic diarrhea of unknown origin   Pressure injury of skin   Plan: #Spastic cerebral palsy #Progressive weakness of bilateral lower extremities #Chronic back pain #Lumbar spinal stenosis -The patient has been admitted to the hospital multiple times over the last few months with increasing weakness in the bilateral  lower extremities.  -This has been thought to be 2/2 increased muscle relaxants (dantrolene ), a shingles infection, and lumbar stenosis found on MRI. She has been to inpatient rehab multiple times with adjustment to medications. She has also been evaluated by neurosurgery and was not interested in surgical options.  -Today, she is feeling about the same and is interested in surgery -NSGY evaluated and recommended to NOT proceed with sx as it will not likely help her symptoms--I suspect this is likely her new baseline unfortunately. -I do think this patient would benefit from continued physical therapy with SNF or CIR -Continue pain management of Tylenol  1000mg  TID, flexeril  5mg  TID, Norco 5-325 q6 PRN -Continue dantrolene  100mg  TID -PT/OT have recommended SNF, insurance authorization in the works. Appreciate assistance from social work  #Stage 2 pressure injury-R buttock -Wound care consulted -Continue pressure offloading -In setting of incontinence, ensure area stays clean    Best Practice: Diet: Regular diet IVF: Fluids: none, Rate: None VTE: rivaroxaban  (XARELTO ) tablet 10 mg Start: 09/26/24 1000 Code: Full  Disposition planning: Therapy Recs: SNF,  DME: other pending DISPO: Pendings  Signature:  Schuyler Novak, DO [facility]  11:43 AM, 09/27/2024  On Call pager [phone]

## 2024-09-27 NOTE — Plan of Care (Signed)
  Problem: Clinical Measurements: Goal: Respiratory complications will improve Outcome: Progressing   Problem: Education: Goal: Knowledge of General Education information will improve Description: Including pain rating scale, medication(s)/side effects and non-pharmacologic comfort measures Outcome: Not Progressing   Problem: Health Behavior/Discharge Planning: Goal: Ability to manage health-related needs will improve Outcome: Not Progressing   Problem: Activity: Goal: Risk for activity intolerance will decrease Outcome: Not Progressing Remains weak in BLE; unable to move legs off of the bed without assistance.

## 2024-09-27 NOTE — [place] FL2 (Signed)
 [place]  MEDICAID FL2 LEVEL OF CARE FORM     IDENTIFICATION  Patient Name: Kathy Owens Birthdate: [birth year] Sex: female Admission Date (Current Location): 09/25/2024  [facility] and [place] Number:  Producer, Television/film/video and Address:  The . [facility], [address], [place], [place] [address]      Provider Number: [id number]  Attending Physician Name and Address:  Trudy Mliss Dragon, MD  Relative Name and Phone Number:  [name] Detrano/sister: [phone]    Current Level of Care: Hospital Recommended Level of Care: Skilled Nursing Facility Prior Approval Number:    Date Approved/Denied:   PASRR Number: [id number]  Discharge Plan: SNF    Current Diagnoses: Patient Active Problem List   Diagnosis Date Noted   Pressure injury of skin 09/26/2024   Weakness of both lower extremities 09/25/2024   Fall 08/24/2024   Lumbar radiculopathy 08/24/2024   Coping style affecting medical condition 08/10/2024   Cerebral palsy, diplegic (HCC) 08/04/2024   Spinal stenosis in cervical region 07/28/2024   Rapidly progressive weakness 07/26/2024   Impaired mobility 07/24/2024   History of progressive weakness 07/24/2024   Zoster, V1 distribution without ocular involvement 07/24/2024   Need for home health care 07/06/2024   Chronic instability of right knee 06/16/2024   Chronic diarrhea of unknown origin 03/24/2024   Spasticity 01/25/2024   Corns and callosities 07/22/2022   Muscle spasticity 01/30/2021   Chronic use of opiate drug for therapeutic purpose 07/12/2020   Lactose intolerance in adult (presumptive dx) 07/12/2020   Porcelain gallbladder 11/04/2018   Non-arteritic AION (anterior ischemic optic neuropathy), bilateral 07/01/2017   Chronic pain due to cerebral palsy 12/13/2015   Pain of right midfoot 05/10/2015   Vertigo, recurrent 07/06/2014   Subacromial impingement of right shoulder 08/10/2013   Health care maintenance 07/29/2011   Essential  hypertension 02/15/2007   Age-related osteoporosis without current pathological fracture 02/15/2007   Hyperlipemia 10/09/2006   Cerebral palsy (HCC) 10/09/2006   Venous insufficiency of left leg 10/09/2006    Orientation RESPIRATION BLADDER Height & Weight     Time, Situation, Place, Self  Normal Incontinent, External catheter Weight: 156 lb 15.5 oz (71.2 kg) Height:  5' (152.4 cm)  BEHAVIORAL SYMPTOMS/MOOD NEUROLOGICAL BOWEL NUTRITION STATUS      Incontinent Diet (Please refer to DC summary)  AMBULATORY STATUS COMMUNICATION OF NEEDS Skin   Limited Assist Verbally Other (Comment) (pressure injury right buttocks)                       Personal Care Assistance Level of Assistance  Bathing, Feeding, Dressing Bathing Assistance: Limited assistance Feeding assistance: Independent Dressing Assistance: Limited assistance     Functional Limitations Info  Sight, Hearing, Speech Sight Info: Impaired Hearing Info: Adequate Speech Info: Adequate    SPECIAL CARE FACTORS FREQUENCY  PT (By licensed PT), OT (By licensed OT)     PT Frequency: 5x/week OT Frequency: 5x/week            Contractures Contractures Info: Not present    Additional Factors Info  Code Status, Allergies Code Status Info: Full Allergies Info: firvanq  (vancomycin ), ms continin (morphine), lioresal  (baclofen ), nsaids, zanaflex  (tizanidine )           Current Medications (09/27/2024):  This is the current hospital active medication list Current Facility-Administered Medications  Medication Dose Route Frequency Provider Last Rate Last Admin   acetaminophen  (TYLENOL ) tablet 1,000 mg  1,000 mg Oral Q8H PRN King, Olivia, DO  artificial tears (LACRILUBE) ophthalmic ointment   Left Eye TID PRN Jolaine Pac, DO       atorvastatin  (LIPITOR) tablet 10 mg  10 mg Oral QPC supper Jolaine Pac, DO   10 mg at 09/26/24 1831   cyanocobalamin  (VITAMIN B12) tablet 500 mcg  500 mcg Oral q AM Jolaine Pac, DO    500 mcg at 09/27/24 9163   cyclobenzaprine  (FLEXERIL ) tablet 5 mg  5 mg Oral TID Jolaine Pac, DO   5 mg at 09/27/24 9162   dantrolene  (DANTRIUM ) capsule 100 mg  100 mg Oral TID Jolaine Pac, DO   100 mg at 09/27/24 0837   feeding supplement (ENSURE PLUS HIGH PROTEIN) liquid 237 mL  237 mL Oral BID BM Jolaine Pac, DO   237 mL at 09/27/24 1044   ferrous sulfate  tablet 325 mg  325 mg Oral Q breakfast Jolaine Pac, DO   325 mg at 09/27/24 9163   Gerhardt's butt cream   Topical TID Trudy Mliss Dragon, MD   Given at 09/27/24 [phone]   HYDROcodone -acetaminophen  (NORCO/VICODIN) 5-325 MG per tablet 1 tablet  1 tablet Oral Q6H PRN Jolaine Pac, DO       loperamide  (IMODIUM ) capsule 2 mg  2 mg Oral PRN Jolaine Pac, DO   2 mg at 09/27/24 1044   rivaroxaban  (XARELTO ) tablet 10 mg  10 mg Oral Daily Jolaine Pac, DO   10 mg at 09/27/24 9163     Discharge Medications: Please see discharge summary for a list of discharge medications.  Relevant Imaging Results:  Relevant Lab Results:   Additional Information SSN: [id number]  Sherline Clack, LCSWA

## 2024-09-28 DIAGNOSIS — M6281 Muscle weakness (generalized): Secondary | ICD-10-CM | POA: Diagnosis not present

## 2024-09-28 MED ORDER — DANTROLENE SODIUM 100 MG PO CAPS: 100.0000 mg | ORAL_CAPSULE | Freq: Two times a day (BID) | ORAL | Status: DC

## 2024-09-28 MED ORDER — DANTROLENE SODIUM 25 MG PO CAPS
75.0000 mg | ORAL_CAPSULE | Freq: Three times a day (TID) | ORAL | Status: DC
  Administered 2024-09-28 – 2024-09-29 (×4): 75 mg via ORAL
  Filled 2024-09-28 (×6): qty 3

## 2024-09-28 NOTE — Progress Notes (Signed)
 HD#3 SUBJECTIVE:  Patient Summary:  Kathy Owens is a 68 y.o. 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   Overnight Events: NAE  Interim History: The patient was seen and examined at the bedside this morning.  She is concerned that her muscle relaxer is creating her weakness and did decline to take it this morning.  She asks us  repetitively about decreasing the dose of dantrolene  as she is very certain that she would be able to walk again if she stopped taking this medication.  All other questions and concerns were addressed at this time.  OBJECTIVE:  Vital Signs: Vitals:   09/28/24 0018 09/28/24 0550 09/28/24 0730 09/28/24 1537  BP: 124/63 134/68 117/66 139/72  Pulse: 79 73 75 86  Resp:   20 20  Temp: 97.7 F (36.5 C) 97.7 F (36.5 C) (!) 97.5 F (36.4 C) 97.7 F (36.5 C)  TempSrc:   Oral Oral  SpO2: 97% 99% 96% 97%  Weight:      Height:       Supplemental O2: Room Air SpO2: 97 %  Filed Weights   09/25/24 2200 09/26/24 0114  Weight: 71.2 kg 71.2 kg     Intake/Output Summary (Last 24 hours) at 09/28/2024 1643 Last data filed at 09/28/2024 1445 Gross per 24 hour  Intake 834 ml  Output 1400 ml  Net -566 ml   Net IO Since Admission: 446 mL [09/28/24 1643]  Physical Exam: Const: Awake, alert in NAD HENT: Normocephalic, atraumatic, mucus membranes moist Card: RRR, No MRG Resp: LCTAB, no increased work of breathing Abd: Soft, NTND, Bsx4 Extremities: Warm, pink, chronically edematous LLE 2/2 surgery.  Neuro: Minimal spasticity noted in RLE, no clonus.    Patient Lines/Drains/Airways Status      Active Line/Drains/Airways     Name Placement date Placement time Site Days   Peripheral IV 09/26/24 Anterior;Right Forearm 09/26/24  0000  Forearm  2   External Urinary Catheter 09/26/24  1400  --  2   Wound 08/04/24 2057 Irritant Contact Dermatitis Perineum Medial 08/04/24  2057  Perineum  55   Wound 08/04/24 2057 Irritant Contact Dermatitis Buttocks Medial 08/04/24  2057  Buttocks  55   Wound 09/26/24 0121 Pressure Injury Buttocks Right Stage 2 -  Partial thickness loss of dermis presenting as a shallow open injury with a red, pink wound bed without slough. 09/26/24  0121  Buttocks  2            Pertinent labs and imaging:     Latest Ref Rng & Units 09/25/2024    2:56 PM 08/23/2024    9:58 PM 08/22/2024    4:33 AM  CBC  WBC 4.0 - 10.5 K/uL 7.3  8.4  5.8   Hemoglobin 12.0 - 15.0 g/dL 86.2  86.8  88.7   Hematocrit 36.0 - 46.0 % 42.1  40.0  34.7   Platelets 150 - 400 K/uL 280  227  184        Latest Ref Rng & Units 09/25/2024    2:56 PM 08/26/2024    3:30 AM 08/23/2024    9:58 PM  CMP  Glucose 70 - 99 mg/dL 95  97  96  BUN 8 - 23 mg/dL 11  34  28   Creatinine 0.44 - 1.00 mg/dL 9.33  9.19  9.06   Sodium 135 - 145 mmol/L 141  137  137   Potassium 3.5 - 5.1 mmol/L 3.8  3.6  3.8   Chloride 98 - 111 mmol/L 106  106  105   CO2 22 - 32 mmol/L 25  18  21    Calcium  8.9 - 10.3 mg/dL 8.7  8.3  9.2   Total Protein 6.5 - 8.1 g/dL 6.6   6.7   Total Bilirubin 0.0 - 1.2 mg/dL 1.0   1.0   Alkaline Phos 38 - 126 U/L 79   54   AST 15 - 41 U/L 41   23   ALT 0 - 44 U/L 53   19     MR LUMBAR SPINE WO CONTRAST Addendum Date: 09/25/2024 ** ADDENDUM #1 ** ADDENDUM: New first impression should state: No significant interval change ---------------------------------------------------- Electronically signed by: Lonni Necessary MD 09/25/2024 06:15 PM EST RP Workstation: [id number]   Result Date: 09/25/2024 ** ORIGINAL REPORT **EXAM: MRI LUMBAR SPINE 09/25/2024 05:51:31 PM TECHNIQUE: Multiplanar  multisequence MRI of the lumbar spine was performed without the administration of intravenous contrast. COMPARISON: MRI of lumbar spine 07/28/2024. CLINICAL HISTORY: Laying in bed today, patient felt a pop in her back when an aide attempted to pick her up. Patient now reports inability to raise either leg off the bed and worsened back pain. FINDINGS: BONES AND ALIGNMENT: Normal alignment. Normal vertebral body heights. Heterogeneous fatty infiltration in the marrow is again noted. Hemangioma at L1 is stable. SPINAL CORD: The conus medullaris terminates at L1. SOFT TISSUES: No paraspinal mass. L1-L2: Broad based disc protrusion and moderate facet hypertrophy at L1-L2 crowds the nerve roots with moderate to severe central canal stenosis, similar to the prior exam. L2-L3: Broad based disc bulge is present at L2-L3 without significant stenosis or change. L3-L4: Rightward disc protrusion is present at L3-L4. Mild subarticular narrowing is worse on the left. Moderate right and mild left foraminal stenosis is present. L4-L5: New broad based disc protrusion at L4-L5 is stable. Advanced asymmetric left sided facet hypertrophy is stable. Mild left greater than right subarticular and foraminal stenosis is present and stable. L5-S1: No significant disc herniation. No spinal canal stenosis or neural foraminal narrowing. IMPRESSION: 1. There is significant interval change. 2. Moderate to severe central canal stenosis at L1-L2 due to broad-based disc protrusion and moderate facet hypertrophy, unchanged. 3. Rightward disc protrusion at L3-L4 with mild subarticular narrowing greater on the left and moderate right/mild left foraminal stenosis. 4. Broad-based disc protrusion at L4-L5 with advanced asymmetric left-sided facet hypertrophy and mild left greater than right subarticular and foraminal stenosis, unchanged. Electronically signed by: Lonni Necessary MD 09/25/2024 06:04 PM EST RP Workstation: [id number]   DG Chest  Portable 1 View Result Date: 09/25/2024 CLINICAL DATA:  Fatigue, back pain, failure to thrive. EXAM: PORTABLE CHEST 1 VIEW COMPARISON:  07/23/2024. FINDINGS: The heart size and mediastinal contours are within normal limits. Minimal subsegmental atelectasis or scarring is noted at the left lung base. No consolidation, effusion, or pneumothorax is seen. No acute osseous abnormality. IMPRESSION: No active disease. Electronically Signed   By: Leita Birmingham M.D.   On: 09/25/2024 17:27    ASSESSMENT/PLAN:  Assessment: Principal Problem:   Weakness of both lower extremities Active Problems:   Cerebral palsy (HCC)   Chronic pain due to cerebral palsy   Chronic diarrhea of unknown origin  Pressure injury of skin   Plan: #Spastic cerebral palsy #Progressive weakness of bilateral lower extremities #Chronic back pain #Lumbar spinal stenosis -The patient has been admitted to the hospital multiple times over the last few months with increasing weakness in the bilateral lower extremities.  -This has been thought to be 2/2 increased muscle relaxants (dantrolene ), a shingles infection, and lumbar stenosis found on MRI. She has been to inpatient rehab multiple times with adjustment to medications. She has also been evaluated by neurosurgery and was not interested in surgical options.  -NSGY evaluated and recommended to NOT proceed with sx as it will not likely help her symptoms--I suspect this is likely her new baseline unfortunately. -I do think this patient would benefit from continued physical therapy with SNF or CIR -Continue pain management of Tylenol  1000mg  TID, flexeril  5mg  TID, Norco 5-325 q6 PRN -PT/OT have recommended SNF, insurance authorization in the works. Appreciate assistance from social work - The patient did refuse her morning dantrolene  and is asking for decreased dosing.  I will decrease this to 75 mg 3 times daily as I have concerned that if the dose is not decreased the patient will  stop taking the medication and precipitate withdrawal.  We did have a long discussion that this is likely not the cause of her weakness in September and is not going to allow her to walk again.  We also discussed that her spasticity may increase yet again with decreasing dantrolene . Of note, dosing of this drug has been adjusted up and back down multiple times in the past with similar hope from the patient.  #Stage 2 pressure injury-R buttock -Wound care consulted -Continue pressure offloading -In setting of incontinence, ensure area stays clean    Best Practice: Diet: Regular diet IVF: Fluids: none, Rate: None VTE: rivaroxaban  (XARELTO ) tablet 10 mg Start: 09/26/24 1000 Code: Full  Disposition planning: Therapy Recs: SNF, DME: other pending DISPO: Pendings  Signature:  Schuyler Novak, DO [facility]  4:43 PM, 09/28/2024  On Call pager [phone]

## 2024-09-28 NOTE — Plan of Care (Addendum)
 Patient calm and cooperative A&O X3, turn tolerance maintained. Patient left with call bell in reach and bed in lowest position.   Problem: Education: Goal: Knowledge of General Education information will improve Description: Including pain rating scale, medication(s)/side effects and non-pharmacologic comfort measures Outcome: Progressing   Problem: Clinical Measurements: Goal: Ability to maintain clinical measurements within normal limits will improve Outcome: Progressing   Problem: Activity: Goal: Risk for activity intolerance will decrease Outcome: Progressing   Problem: Coping: Goal: Level of anxiety will decrease Outcome: Progressing   Problem: Pain Managment: Goal: General experience of comfort will improve and/or be controlled Outcome: Progressing   Problem: Safety: Goal: Ability to remain free from injury will improve Outcome: Progressing   Problem: Skin Integrity: Goal: Risk for impaired skin integrity will decrease Outcome: Progressing

## 2024-09-28 NOTE — TOC Progression Note (Addendum)
 Transition of Care [facility]) - Progression Note    Patient Details  Name: Kathy Owens MRN: [id number] Date of Birth: [birth year]  [facility] CM/SW Contact  Sherline Clack, [place] Phone Number: 09/28/2024, 3:23 PM  Clinical Narrative:     Update 3:39 PM: CSW submitted insurance shara shara ID: [id number]. CSW will continue to monitor authorization.  CSW met with patient at bedside and reviewed facility list along with Medicare ratings. Patient would like to return to [facility] for rehab and requested CSW accept bed offer. CSW accepted facility in the hub and will submit insurance auth. CSW will continue to follow.   Expected Discharge Plan: Skilled Nursing Facility Barriers to Discharge: Insurance Authorization, SNF Pending bed offer               Expected Discharge Plan and Services       Living arrangements for the past 2 months: Single Family Home                                       Social Drivers of Health (SDOH) Interventions SDOH Screenings   Food Insecurity: No Food Insecurity (09/26/2024)  Housing: Low Risk  (09/26/2024)  Transportation Needs: No Transportation Needs (09/26/2024)  Utilities: Not At Risk (09/26/2024)  Alcohol  Screen: Low Risk  (06/17/2023)  Depression (PHQ2-9): Low Risk  (05/05/2024)  Financial Resource Strain: Low Risk  (06/17/2023)  Physical Activity: Insufficiently Active (06/17/2023)  Social Connections: Socially Isolated (09/26/2024)  Stress: No Stress Concern Present (06/17/2023)  Tobacco Use: Low Risk  (09/25/2024)  Health Literacy: Adequate Health Literacy (06/17/2023)    Readmission Risk Interventions     No data to display

## 2024-09-29 DIAGNOSIS — M6281 Muscle weakness (generalized): Secondary | ICD-10-CM | POA: Diagnosis not present

## 2024-09-29 MED ADMIN — Dantrolene Sodium Cap 100 MG: 100 mg | ORAL | NDC 00115443301

## 2024-09-29 MED FILL — Dantrolene Sodium Cap 100 MG: 100.0000 mg | ORAL | Qty: 1 | Status: AC

## 2024-09-29 NOTE — Plan of Care (Signed)

## 2024-09-29 NOTE — Plan of Care (Signed)
 Patient calm and cooperative X3, stiffness in bilateral lower extremities. Wound on left buttock and gluteal cleft healing with no open skin. Staff maintained turn tolerance. Sister called nurse for updates, patient notified. Patient left with call bell in reach and bed in lowest position.  Problem: Education: Goal: Knowledge of General Education information will improve Description: Including pain rating scale, medication(s)/side effects and non-pharmacologic comfort measures Outcome: Progressing   Problem: Health Behavior/Discharge Planning: Goal: Ability to manage health-related needs will improve Outcome: Progressing   Problem: Clinical Measurements: Goal: Ability to maintain clinical measurements within normal limits will improve Outcome: Progressing   Problem: Activity: Goal: Risk for activity intolerance will decrease Outcome: Progressing   Problem: Nutrition: Goal: Adequate nutrition will be maintained Outcome: Progressing   Problem: Skin Integrity: Goal: Risk for impaired skin integrity will decrease Outcome: Progressing

## 2024-09-29 NOTE — Progress Notes (Signed)
 Physical Therapy Treatment Patient Details Name: Kathy Owens MRN: [id number] DOB: Mar 03, 1956 Today's Date: 09/29/2024   History of Present Illness PT is a 68 yo female admitted 12/7 following a pop in her back while sister was bathing her in bed and pt has LE weakness (chronic). Pt was in [facility] hospital s/p fall in Oct and was on acute rehab from 10/16-11/4. Pt went home after declining further rehab and readmitted same day as d/c after a fall.  Pt went to [facility] after that fall and was just d/c'd home 12/5 and now readmitted. MRI L1-2 canal stenosis and L3-4, L4-5 disc protrusion. PMH: spastic cerebral palsy, chronic pain syndrome, shingles, limbar spinal stenosis, CKD, T2DM, HTN, chronic diarrhea, anterior ischemic optic neuropathy.    PT Comments  Pt received in supine and agreeable to session. Pt requires max A for bed mobility due to posterior lean, increased tone, and weakness. Pt able to tolerate multiple standing trials, but demonstrates difficulty reaching a full upright posture due to significant L knee valgus and decreased hip extension. Pt unable to scoot along EOB without max-total A. Pt continues to benefit from PT services to progress toward functional mobility goals.    If plan is discharge home, recommend the following: Two people to help with walking and/or transfers;Two people to help with bathing/dressing/bathroom;Assistance with cooking/housework;Assist for transportation;Help with stairs or ramp for entrance   Can travel by private vehicle     No  Equipment Recommendations  Hospital bed;Hoyer lift (If returning home* - Will need to ensure hoyer will fit.)    Recommendations for Other Services       Precautions / Restrictions Precautions Precautions: Fall Recall of Precautions/Restrictions: Impaired Precaution/Restrictions Comments: poor memory/historian Restrictions Weight Bearing Restrictions Per Provider Order: No Other Position/Activity Restrictions: Pt  with significant contractures and increased tone     Mobility  Bed Mobility Overal bed mobility: Needs Assistance Bed Mobility: Supine to Sit     Supine to sit: Max assist, HOB elevated, Used rails Sit to supine: Max assist, Used rails   General bed mobility comments: Cues for sequencing and technique. Max A for all aspects. Pt demonstrates posterior lean sitting EOB and is unable to scoot forward or backward requiring assist    Transfers Overall transfer level: Needs assistance Equipment used: Ambulation equipment used Transfers: Sit to/from Stand Sit to Stand: Mod assist, From elevated surface, Via lift equipment           General transfer comment: STS from elevated EOB x2 with mod A and stedy paddles x1 with min A. Pt demonstrates decreased hip extension and severe L knee valgus in standing limiting tolerance and upright posture    Ambulation/Gait             Pre-gait activities: wight shifting     Stairs             Wheelchair Mobility     Tilt Bed    Modified Rankin (Stroke Patients Only)       Balance Overall balance assessment: Needs assistance Sitting-balance support: Feet supported, Single extremity supported Sitting balance-Leahy Scale: Poor Sitting balance - Comments: Posterior and L lateral leans sitting EOB requiring mod A to maintain balance   Standing balance support: Bilateral upper extremity supported, Reliant on assistive device for balance Standing balance-Leahy Scale: Poor Standing balance comment: reliant on stedy support  Communication Communication Communication: Impaired Factors Affecting Communication: Hearing impaired  Cognition Arousal: Alert Behavior During Therapy: WFL for tasks assessed/performed   PT - Cognitive impairments: No family/caregiver present to determine baseline, Awareness                         Following commands: Intact      Cueing Cueing  Techniques: Verbal cues  Exercises General Exercises - Lower Extremity Long Arc Quad: AROM, Seated, Both, 5 reps Hip Flexion/Marching: AROM, Seated, Both, 5 reps    General Comments        Pertinent Vitals/Pain Pain Assessment Pain Assessment: No/denies pain     PT Goals (current goals can now be found in the care plan section) Acute Rehab PT Goals Patient Stated Goal: Walk again PT Goal Formulation: With patient Time For Goal Achievement: 10/10/24 Progress towards PT goals: Progressing toward goals    Frequency    Min 2X/week       AM-PAC PT 6 Clicks Mobility   Outcome Measure  Help needed turning from your back to your side while in a flat bed without using bedrails?: A Lot Help needed moving from lying on your back to sitting on the side of a flat bed without using bedrails?: A Lot Help needed moving to and from a bed to a chair (including a wheelchair)?: A Lot Help needed standing up from a chair using your arms (e.g., wheelchair or bedside chair)?: A Lot Help needed to walk in hospital room?: Total Help needed climbing 3-5 steps with a railing? : Total 6 Click Score: 10    End of Session Equipment Utilized During Treatment: Gait belt Activity Tolerance: Patient tolerated treatment well Patient left: in bed;with call bell/phone within reach;with bed alarm set Nurse Communication: Mobility status PT Visit Diagnosis: Unsteadiness on feet (R26.81);Other abnormalities of gait and mobility (R26.89);Muscle weakness (generalized) (M62.81);History of falling (Z91.81);Difficulty in walking, not elsewhere classified (R26.2);Other symptoms and signs involving the nervous system (R29.898)     Time: 8897-8864 PT Time Calculation (min) (ACUTE ONLY): 33 min  Charges:    $Therapeutic Activity: 23-37 mins PT General Charges $$ ACUTE PT VISIT: 1 Visit                    Darryle George, PTA Acute Rehabilitation Services Secure Chat Preferred  [phone]     Darryle George 09/29/2024, 12:55 PM

## 2024-09-29 NOTE — TOC Progression Note (Signed)
 Transition of Care [facility]) - Progression Note    Patient Details  Name: Kathy Owens MRN: [id number] Date of Birth: 09-Oct-1956  Transition of Care [facility]) CM/SW Contact  Rosaline JONELLE Joe, RN Phone Number: 09/29/2024, 12:10 PM  Clinical Narrative:    CM spoke with Andres Faster, SW with [facility] [phone]- she is aware that patient is waiting for placement at [facility].  [place] plans to follow up with the nursing facility to be sure that patient is likely set up with Personal care services through [facility] Medicaid.  Faster will be notified once patient is discharged to the facility so that she can follow up with SW at [facility].   Expected Discharge Plan: Skilled Nursing Facility Barriers to Discharge: Insurance Authorization, SNF Pending bed offer               Expected Discharge Plan and Services       Living arrangements for the past 2 months: Single Family Home                                       Social Drivers of Health (SDOH) Interventions SDOH Screenings   Food Insecurity: No Food Insecurity (09/26/2024)  Housing: Low Risk (09/26/2024)  Transportation Needs: No Transportation Needs (09/26/2024)  Utilities: Not At Risk (09/26/2024)  Alcohol  Screen: Low Risk (06/17/2023)  Depression (PHQ2-9): Low Risk (05/05/2024)  Financial Resource Strain: Low Risk (06/17/2023)  Physical Activity: Insufficiently Active (06/17/2023)  Social Connections: Socially Isolated (09/26/2024)  Stress: No Stress Concern Present (06/17/2023)  Tobacco Use: Low Risk (09/25/2024)  Health Literacy: Adequate Health Literacy (06/17/2023)    Readmission Risk Interventions     No data to display

## 2024-09-29 NOTE — TOC Progression Note (Signed)
 Transition of Care [facility]) - Progression Note    Patient Details  Name: Kathy Owens MRN: [id number] Date of Birth: 06-Dec-1955  Transition of Care [facility]) CM/SW Contact  Sherline Clack, [place] Phone Number: 09/29/2024, 1:09 PM  Clinical Narrative:     CSW checked patient's insurance authorization and patient's insurance authorization is pending. CSW will continue to monitor auth status and will update both provider and facility.   Expected Discharge Plan: Skilled Nursing Facility Barriers to Discharge: Insurance Authorization, SNF Pending bed offer               Expected Discharge Plan and Services       Living arrangements for the past 2 months: Single Family Home                                       Social Drivers of Health (SDOH) Interventions SDOH Screenings   Food Insecurity: No Food Insecurity (09/26/2024)  Housing: Low Risk (09/26/2024)  Transportation Needs: No Transportation Needs (09/26/2024)  Utilities: Not At Risk (09/26/2024)  Alcohol  Screen: Low Risk (06/17/2023)  Depression (PHQ2-9): Low Risk (05/05/2024)  Financial Resource Strain: Low Risk (06/17/2023)  Physical Activity: Insufficiently Active (06/17/2023)  Social Connections: Socially Isolated (09/26/2024)  Stress: No Stress Concern Present (06/17/2023)  Tobacco Use: Low Risk (09/25/2024)  Health Literacy: Adequate Health Literacy (06/17/2023)    Readmission Risk Interventions     No data to display

## 2024-09-29 NOTE — Progress Notes (Signed)
 HD#2 SUBJECTIVE:  Patient Summary:  Kathy Owens is a 68 y.o. 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   Overnight Events: no overnight events   Interim History: Patient was seen at bedside and states that as discussed yesterday with Dr. Myrna, patient noticed with the decreased dose that she was having increased spasticity.  She was not able to work with PT as much because she was so stiff.  She states that she would like to go back to her old increased dose.  Did try to discuss side effects and pros and cons of increasing versus keeping current dose.  Stated that she still like to go up on the dose and we will check in on her tomorrow.  OBJECTIVE:  Vital Signs: Vitals:   09/29/24 0516 09/29/24 0522 09/29/24 0733 09/29/24 1203  BP: 110/65  127/68 122/70  Pulse: 86  72 87  Resp: 18     Temp: 98.1 F (36.7 C)  98.1 F (36.7 C)   TempSrc: Oral     SpO2: 97% 100% 96% 98%  Weight:      Height:       Supplemental O2: Room Air SpO2: 98 %  Filed Weights   09/25/24 2200 09/26/24 0114  Weight: 71.2 kg 71.2 kg     Intake/Output Summary (Last 24 hours) at 09/29/2024 1410 Last data filed at 09/28/2024 2300 Gross per 24 hour  Intake 480 ml  Output 450 ml  Net 30 ml   Net IO Since Admission: 246 mL [09/29/24 1410]  Physical Exam: Const: Awake, alert in NAD HENT: Normocephalic, atraumatic, mucus membranes moist Card: RRR, No MRG Resp: LCTAB, no increased work of breathing Abd: Soft, NTND, Bsx4 Extremities: Warm, pink, chronically edematous LLE 2/2 surgery.  Neuro: increased spasticity noted in upper  extremities, no clonus.    Patient Lines/Drains/Airways Status     Active Line/Drains/Airways     Name Placement date Placement time Site Days   Peripheral IV 09/26/24 Anterior;Right Forearm 09/26/24  0000  Forearm  3   External Urinary Catheter 09/26/24  1400  --  3   Wound 08/04/24 2057 Irritant Contact Dermatitis Perineum Medial 08/04/24  2057  Perineum  56   Wound 08/04/24 2057 Irritant Contact Dermatitis Buttocks Medial 08/04/24  2057  Buttocks  56   Wound 09/26/24 0121 Pressure Injury Buttocks Right Stage 2 -  Partial thickness loss of dermis presenting as a shallow open injury with a red, pink wound bed without slough. 09/26/24  0121  Buttocks  3            Pertinent labs and imaging:     Latest Ref Rng & Units 09/25/2024    2:56 PM 08/23/2024    9:58 PM 08/22/2024    4:33 AM  CBC  WBC 4.0 - 10.5 K/uL 7.3  8.4  5.8   Hemoglobin 12.0 - 15.0 g/dL 86.2  86.8  88.7   Hematocrit 36.0 - 46.0 % 42.1  40.0  34.7   Platelets 150 - 400 K/uL 280  227  184        Latest Ref Rng & Units 09/25/2024    2:56 PM 08/26/2024    3:30 AM 08/23/2024  9:58 PM  CMP  Glucose 70 - 99 mg/dL 95  97  96   BUN 8 - 23 mg/dL 11  34  28   Creatinine 0.44 - 1.00 mg/dL 9.33  9.19  9.06   Sodium 135 - 145 mmol/L 141  137  137   Potassium 3.5 - 5.1 mmol/L 3.8  3.6  3.8   Chloride 98 - 111 mmol/L 106  106  105   CO2 22 - 32 mmol/L 25  18  21    Calcium  8.9 - 10.3 mg/dL 8.7  8.3  9.2   Total Protein 6.5 - 8.1 g/dL 6.6   6.7   Total Bilirubin 0.0 - 1.2 mg/dL 1.0   1.0   Alkaline Phos 38 - 126 U/L 79   54   AST 15 - 41 U/L 41   23   ALT 0 - 44 U/L 53   19     MR LUMBAR SPINE WO CONTRAST Addendum Date: 09/25/2024 ** ADDENDUM #1 ** ADDENDUM: New first impression should state: No significant interval change ---------------------------------------------------- Electronically signed by: Lonni Necessary MD 09/25/2024 06:15 PM EST RP Workstation: [id number]   Result Date: 09/25/2024 ** ORIGINAL REPORT  **EXAM: MRI LUMBAR SPINE 09/25/2024 05:51:31 PM TECHNIQUE: Multiplanar multisequence MRI of the lumbar spine was performed without the administration of intravenous contrast. COMPARISON: MRI of lumbar spine 07/28/2024. CLINICAL HISTORY: Laying in bed today, patient felt a pop in her back when an aide attempted to pick her up. Patient now reports inability to raise either leg off the bed and worsened back pain. FINDINGS: BONES AND ALIGNMENT: Normal alignment. Normal vertebral body heights. Heterogeneous fatty infiltration in the marrow is again noted. Hemangioma at L1 is stable. SPINAL CORD: The conus medullaris terminates at L1. SOFT TISSUES: No paraspinal mass. L1-L2: Broad based disc protrusion and moderate facet hypertrophy at L1-L2 crowds the nerve roots with moderate to severe central canal stenosis, similar to the prior exam. L2-L3: Broad based disc bulge is present at L2-L3 without significant stenosis or change. L3-L4: Rightward disc protrusion is present at L3-L4. Mild subarticular narrowing is worse on the left. Moderate right and mild left foraminal stenosis is present. L4-L5: New broad based disc protrusion at L4-L5 is stable. Advanced asymmetric left sided facet hypertrophy is stable. Mild left greater than right subarticular and foraminal stenosis is present and stable. L5-S1: No significant disc herniation. No spinal canal stenosis or neural foraminal narrowing. IMPRESSION: 1. There is significant interval change. 2. Moderate to severe central canal stenosis at L1-L2 due to broad-based disc protrusion and moderate facet hypertrophy, unchanged. 3. Rightward disc protrusion at L3-L4 with mild subarticular narrowing greater on the left and moderate right/mild left foraminal stenosis. 4. Broad-based disc protrusion at L4-L5 with advanced asymmetric left-sided facet hypertrophy and mild left greater than right subarticular and foraminal stenosis, unchanged. Electronically signed by: Lonni Necessary MD  09/25/2024 06:04 PM EST RP Workstation: [id number]   DG Chest Portable 1 View Result Date: 09/25/2024 CLINICAL DATA:  Fatigue, back pain, failure to thrive. EXAM: PORTABLE CHEST 1 VIEW COMPARISON:  07/23/2024. FINDINGS: The heart size and mediastinal contours are within normal limits. Minimal subsegmental atelectasis or scarring is noted at the left lung base. No consolidation, effusion, or pneumothorax is seen. No acute osseous abnormality. IMPRESSION: No active disease. Electronically Signed   By: Leita Birmingham M.D.   On: 09/25/2024 17:27    ASSESSMENT/PLAN:  Assessment: Principal Problem:   Weakness of both lower extremities Active Problems:   Cerebral  palsy (HCC)   Chronic pain due to cerebral palsy   Chronic diarrhea of unknown origin   Pressure injury of skin   Kathy Owens is a 68 y.o. female with PMH of spastic cerebral palsy, chronic lumbar spinal stenosis, chronic pain syndrome, recent hospitalization 08/23/2024 for fall at home after early discharge from inpatient rehabilitation, recently treated singles in V1 (finished treatment 07/30/2024), HTN, hyperlipidemia, T2DM, anemia, CKD 3/mild azotemia, chronic diarrhea who presented with leg weakness and admitted for weakness of bilateral LE's and management of spastic cerebral palsy.   Plan: #Spastic cerebral palsy #Progressive weakness of bilateral lower extremities #Chronic back pain #Lumbar spinal stenosis -The patient has been admitted to the hospital multiple times over the last few months with increasing weakness in the bilateral lower extremities.  -This has been thought to be 2/2 increased muscle relaxants (dantrolene ), a shingles infection, and lumbar stenosis found on MRI. Not interested in surgical options with NSGY but will likely not improve her symptoms greatly. This is likely her new baseline.  -pending SNF per PT and Ots recommendations  -Continue pain management of Tylenol  1000mg  TID, flexeril  5mg  TID, Norco 5-325  q6 PRN -patient agreed to increase dantrolene  dose back to 100 mg 3 times daily.  Will check on this tomorrow.  #Stage 2 pressure injury-R buttock -Wound care consulted -Continue pressure offloading -In setting of incontinence, ensure area stays clean    Best Practice: Diet: Regular diet IVF: Fluids: none, Rate: None VTE: rivaroxaban  (XARELTO ) tablet 10 mg Start: 09/26/24 1000 Code: Full  Disposition planning: Therapy Recs: SNF, DME: other pending DISPO: Pending insurance authorization for SNF  Signature:  Remonia Romano, DO [facility]  2:10 PM, 09/29/2024  On Call pager [phone]

## 2024-09-30 DIAGNOSIS — M6281 Muscle weakness (generalized): Secondary | ICD-10-CM | POA: Diagnosis not present

## 2024-09-30 MED ADMIN — Dantrolene Sodium Cap 100 MG: 100 mg | ORAL | NDC 00115443301

## 2024-09-30 NOTE — Plan of Care (Signed)

## 2024-09-30 NOTE — Plan of Care (Signed)
  Problem: Education: Goal: Knowledge of General Education information will improve Description: Including pain rating scale, medication(s)/side effects and non-pharmacologic comfort measures Outcome: Progressing   Problem: Clinical Measurements: Goal: Ability to maintain clinical measurements within normal limits will improve Outcome: Progressing   Problem: Nutrition: Goal: Adequate nutrition will be maintained Outcome: Progressing   Problem: Skin Integrity: Goal: Risk for impaired skin integrity will decrease Outcome: Progressing   

## 2024-09-30 NOTE — Progress Notes (Cosign Needed Addendum)
 HD#2 SUBJECTIVE:  Patient Summary:  Kathy Owens is a 68 y.o. 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   Overnight Events: no overnight events   Interim History: Patient's spasticity is better today after increasing dantrolene  dose yesterday.  She is happy to stay at this dose.  No other concerns today  OBJECTIVE:  Vital Signs: Vitals:   09/29/24 1712 09/29/24 2025 09/30/24 0033 09/30/24 0312  BP: 124/66 138/66 (!) 108/55 111/66  Pulse: 94 90 80 70  Resp:      Temp: 98.4 F (36.9 C) 97.9 F (36.6 C) 98.3 F (36.8 C) 97.6 F (36.4 C)  TempSrc: Oral     SpO2: 98% 96% 96% 97%  Weight:      Height:       Supplemental O2: Room Air SpO2: 97 %  Filed Weights   09/25/24 2200 09/26/24 0114  Weight: 71.2 kg 71.2 kg     Intake/Output Summary (Last 24 hours) at 09/30/2024 0656 Last data filed at 09/29/2024 1600 Gross per 24 hour  Intake --  Output 300 ml  Net -300 ml   Net IO Since Admission: -54 mL [09/30/24 0656]  Physical Exam: Const: Awake, alert in NAD HENT: Normocephalic, atraumatic, mucus membranes moist Card: RRR, No MRG Resp: LCTAB, no increased work of breathing Abd: Soft, NTND, Bsx4 Extremities: Warm, pink, chronically edematous LLE 2/2 surgery.  Neuro: Less spasticity noted in upper extremities compared to yesterday.  Patient Lines/Drains/Airways Status     Active Line/Drains/Airways     Name Placement date Placement time Site Days   Peripheral IV 09/26/24 Anterior;Right Forearm 09/26/24  0000  Forearm  4   External Urinary Catheter 09/26/24  1400  --  4   Wound 08/04/24 2057 Irritant  Contact Dermatitis Perineum Medial 08/04/24  2057  Perineum  57   Wound 08/04/24 2057 Irritant Contact Dermatitis Buttocks Medial 08/04/24  2057  Buttocks  57   Wound 09/26/24 0121 Pressure Injury Buttocks Right Stage 2 -  Partial thickness loss of dermis presenting as a shallow open injury with a red, pink wound bed without slough. 09/26/24  0121  Buttocks  4            Pertinent labs and imaging:     Latest Ref Rng & Units 09/25/2024    2:56 PM 08/23/2024    9:58 PM 08/22/2024    4:33 AM  CBC  WBC 4.0 - 10.5 K/uL 7.3  8.4  5.8   Hemoglobin 12.0 - 15.0 g/dL 86.2  86.8  88.7   Hematocrit 36.0 - 46.0 % 42.1  40.0  34.7   Platelets 150 - 400 K/uL 280  227  184        Latest Ref Rng & Units 09/25/2024    2:56 PM 08/26/2024    3:30 AM 08/23/2024    9:58 PM  CMP  Glucose 70 - 99 mg/dL 95  97  96   BUN 8 - 23 mg/dL 11  34  28   Creatinine 0.44 - 1.00 mg/dL 9.33  9.19  9.06   Sodium 135 - 145 mmol/L 141  137  137   Potassium 3.5 - 5.1 mmol/L 3.8  3.6  3.8  Chloride 98 - 111 mmol/L 106  106  105   CO2 22 - 32 mmol/L 25  18  21    Calcium  8.9 - 10.3 mg/dL 8.7  8.3  9.2   Total Protein 6.5 - 8.1 g/dL 6.6   6.7   Total Bilirubin 0.0 - 1.2 mg/dL 1.0   1.0   Alkaline Phos 38 - 126 U/L 79   54   AST 15 - 41 U/L 41   23   ALT 0 - 44 U/L 53   19     MR LUMBAR SPINE WO CONTRAST Addendum Date: 09/25/2024 ** ADDENDUM #1 ** ADDENDUM: New first impression should state: No significant interval change ---------------------------------------------------- Electronically signed by: Lonni Necessary MD 09/25/2024 06:15 PM EST RP Workstation: [id number]   Result Date: 09/25/2024 ** ORIGINAL REPORT **EXAM: MRI LUMBAR SPINE 09/25/2024 05:51:31 PM TECHNIQUE: Multiplanar multisequence MRI of the lumbar spine was performed without the administration of intravenous contrast. COMPARISON: MRI of lumbar spine 07/28/2024. CLINICAL HISTORY: Laying in bed today, patient felt a pop in her back when an aide  attempted to pick her up. Patient now reports inability to raise either leg off the bed and worsened back pain. FINDINGS: BONES AND ALIGNMENT: Normal alignment. Normal vertebral body heights. Heterogeneous fatty infiltration in the marrow is again noted. Hemangioma at L1 is stable. SPINAL CORD: The conus medullaris terminates at L1. SOFT TISSUES: No paraspinal mass. L1-L2: Broad based disc protrusion and moderate facet hypertrophy at L1-L2 crowds the nerve roots with moderate to severe central canal stenosis, similar to the prior exam. L2-L3: Broad based disc bulge is present at L2-L3 without significant stenosis or change. L3-L4: Rightward disc protrusion is present at L3-L4. Mild subarticular narrowing is worse on the left. Moderate right and mild left foraminal stenosis is present. L4-L5: New broad based disc protrusion at L4-L5 is stable. Advanced asymmetric left sided facet hypertrophy is stable. Mild left greater than right subarticular and foraminal stenosis is present and stable. L5-S1: No significant disc herniation. No spinal canal stenosis or neural foraminal narrowing. IMPRESSION: 1. There is significant interval change. 2. Moderate to severe central canal stenosis at L1-L2 due to broad-based disc protrusion and moderate facet hypertrophy, unchanged. 3. Rightward disc protrusion at L3-L4 with mild subarticular narrowing greater on the left and moderate right/mild left foraminal stenosis. 4. Broad-based disc protrusion at L4-L5 with advanced asymmetric left-sided facet hypertrophy and mild left greater than right subarticular and foraminal stenosis, unchanged. Electronically signed by: Lonni Necessary MD 09/25/2024 06:04 PM EST RP Workstation: [id number]   DG Chest Portable 1 View Result Date: 09/25/2024 CLINICAL DATA:  Fatigue, back pain, failure to thrive. EXAM: PORTABLE CHEST 1 VIEW COMPARISON:  07/23/2024. FINDINGS: The heart size and mediastinal contours are within normal limits. Minimal  subsegmental atelectasis or scarring is noted at the left lung base. No consolidation, effusion, or pneumothorax is seen. No acute osseous abnormality. IMPRESSION: No active disease. Electronically Signed   By: Leita Birmingham M.D.   On: 09/25/2024 17:27    ASSESSMENT/PLAN:  Assessment: Principal Problem:   Weakness of both lower extremities Active Problems:   Cerebral palsy (HCC)   Chronic pain due to cerebral palsy   Chronic diarrhea of unknown origin   Pressure injury of skin   Kathy Owens is a 68 y.o. female with PMH of spastic cerebral palsy, chronic lumbar spinal stenosis, chronic pain syndrome, recent hospitalization 08/23/2024 for fall at home after early discharge from inpatient rehabilitation, recently treated singles in V1 (finished  treatment 07/30/2024), HTN, hyperlipidemia, T2DM, anemia, CKD 3/mild azotemia, chronic diarrhea who presented with leg weakness and admitted for weakness of bilateral LE's and management of spastic cerebral palsy.   Plan: #Spastic cerebral palsy #Progressive weakness of bilateral lower extremities #Chronic back pain #Lumbar spinal stenosis -The patient has been admitted to the hospital multiple times over the last few months with increasing weakness in the bilateral lower extremities.  -This has been thought to be 2/2 increased muscle relaxants (dantrolene ), a shingles infection, and lumbar stenosis found on MRI. Not interested in surgical options with NSGY but will likely not improve her symptoms greatly. This is likely her new baseline.  -pending SNF per PT and Ots recommendations  -Continue pain management of Tylenol  1000mg  TID, flexeril  5mg  TID, Norco 5-325 q6 PRN - Continue dantrolene  100 mg 3 times daily.  This dose has decreased her spasticity and patient is amenable to staying on this dose.  #Stage 2 pressure injury-R buttock -Wound care consulted -Continue pressure offloading -In setting of incontinence, ensure area stays clean    Best  Practice: Diet: Regular diet IVF: Fluids: none, Rate: None VTE: rivaroxaban  (XARELTO ) tablet 10 mg Start: 09/26/24 1000 Code: Full  Disposition planning: Therapy Recs: SNF, will need to get clarification as 1 of patient's sisters is trying to get paperwork filled out so she can stay with her.  Unsure if patient is aware of the situation.  DME: other pending DISPO: Pending insurance authorization for SNF  Signature:  Keshana Klemz D'Mello, DO [facility]  6:56 AM, 09/30/2024  On Call pager [phone]

## 2024-09-30 NOTE — TOC Progression Note (Addendum)
 Transition of Care [facility]) - Progression Note    Patient Details  Name: Kathy Owens MRN: [id number] Date of Birth: 29-Aug-1956  [facility]) CM/SW Contact  Sherline Clack, [place] Phone Number: 09/30/2024, 4:26 PM  Clinical Narrative:     Update 5:37 PM: Patient's insurance shara was denied. [employer] to send denial and appeal paperwork to present to patient and family. CSW will discuss next steps with family. Provider made aware.  Insurance is offering a peer to peer due today at 5 pm. The number to call is [phone], opt 5. patient's DOB is [birth year] and her member ID is [id number]. Provider made aware.   Expected Discharge Plan: Skilled Nursing Facility Barriers to Discharge: Insurance Authorization, SNF Pending bed offer               Expected Discharge Plan and Services       Living arrangements for the past 2 months: Single Family Home                                       Social Drivers of Health (SDOH) Interventions SDOH Screenings   Food Insecurity: No Food Insecurity (09/26/2024)  Housing: Low Risk (09/26/2024)  Transportation Needs: No Transportation Needs (09/26/2024)  Utilities: Not At Risk (09/26/2024)  Alcohol  Screen: Low Risk (06/17/2023)  Depression (PHQ2-9): Low Risk (05/05/2024)  Financial Resource Strain: Low Risk (06/17/2023)  Physical Activity: Insufficiently Active (06/17/2023)  Social Connections: Socially Isolated (09/26/2024)  Stress: No Stress Concern Present (06/17/2023)  Tobacco Use: Low Risk (09/25/2024)  Health Literacy: Adequate Health Literacy (06/17/2023)    Readmission Risk Interventions     No data to display

## 2024-10-01 DIAGNOSIS — R29898 Other symptoms and signs involving the musculoskeletal system: Principal | ICD-10-CM

## 2024-10-01 DIAGNOSIS — Z8619 Personal history of other infectious and parasitic diseases: Secondary | ICD-10-CM

## 2024-10-01 DIAGNOSIS — M6281 Muscle weakness (generalized): Secondary | ICD-10-CM | POA: Diagnosis not present

## 2024-10-01 DIAGNOSIS — G800 Spastic quadriplegic cerebral palsy: Secondary | ICD-10-CM

## 2024-10-01 DIAGNOSIS — M48062 Spinal stenosis, lumbar region with neurogenic claudication: Secondary | ICD-10-CM

## 2024-10-01 MED ORDER — POLYETHYLENE GLYCOL 3350 17 G PO PACK
17.0000 g | PACK | ORAL | Status: DC | PRN
  Administered 2024-10-02: 17 g via ORAL
  Filled 2024-10-01: qty 1

## 2024-10-01 MED ADMIN — Dantrolene Sodium Cap 100 MG: 100 mg | ORAL | NDC 00115443301

## 2024-10-01 NOTE — Plan of Care (Signed)
   Problem: Education: Goal: Knowledge of General Education information will improve Description: Including pain rating scale, medication(s)/side effects and non-pharmacologic comfort measures Outcome: Progressing   Problem: Health Behavior/Discharge Planning: Goal: Ability to manage health-related needs will improve Outcome: Progressing   Problem: Activity: Goal: Risk for activity intolerance will decrease Outcome: Progressing

## 2024-10-01 NOTE — Progress Notes (Signed)
 HD#2 SUBJECTIVE:  Patient Summary:  Kathy Owens is a 68 y.o. 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   Overnight Events: no overnight events   Interim History: Patient states that her medication is doing well.  Did inform her that after peer to peer yesterday insurance did not approve for SNF.  Patient understands.  Will try to get her home health.  Did call sister and let her know and sisters asking for some DME supplies.  Will try to coordinate this  OBJECTIVE:  Vital Signs: Vitals:   10/01/24 0042 10/01/24 0427 10/01/24 0500 10/01/24 0756  BP: 116/71 128/66  117/66  Pulse: 87 74  83  Resp:    16  Temp: 98.1 F (36.7 C) (!) 97.5 F (36.4 C)  (!) 97.3 F (36.3 C)  TempSrc:      SpO2: 96% 96%  98%  Weight:   71.1 kg   Height:       Supplemental O2: Room Air SpO2: 98 %  Filed Weights   09/25/24 2200 09/26/24 0114 10/01/24 0500  Weight: 71.2 kg 71.2 kg 71.1 kg     Intake/Output Summary (Last 24 hours) at 10/01/2024 1047 Last data filed at 10/01/2024 0700 Gross per 24 hour  Intake 120 ml  Output 400 ml  Net -280 ml   Net IO Since Admission: -334 mL [10/01/24 1047]  Physical Exam: Const: Awake, alert in NAD HENT: Normocephalic, atraumatic, mucus membranes moist Card: RRR, No MRG Resp: LCTAB, no increased work of breathing Abd: Soft, NTND, Bsx4 Extremities: some spasticity noted in bilateral feet but much improved from a couple of days ago    Patient Lines/Drains/Airways Status     Active Line/Drains/Airways     Name Placement date Placement time Site Days   Peripheral IV 09/26/24  Anterior;Right Forearm 09/26/24  0000  Forearm  5   External Urinary Catheter 09/26/24  1400  --  5   Wound 08/04/24 2057 Irritant Contact Dermatitis Perineum Medial 08/04/24  2057  Perineum  58   Wound 08/04/24 2057 Irritant Contact Dermatitis Buttocks Medial 08/04/24  2057  Buttocks  58   Wound 09/26/24 0121 Pressure Injury Buttocks Right Stage 2 -  Partial thickness loss of dermis presenting as a shallow open injury with a red, pink wound bed without slough. 09/26/24  0121  Buttocks  5            Pertinent labs and imaging:     Latest Ref Rng & Units 09/25/2024    2:56 PM 08/23/2024    9:58 PM 08/22/2024    4:33 AM  CBC  WBC 4.0 - 10.5 K/uL 7.3  8.4  5.8   Hemoglobin 12.0 - 15.0 g/dL 86.2  86.8  88.7   Hematocrit 36.0 - 46.0 % 42.1  40.0  34.7   Platelets 150 - 400 K/uL 280  227  184        Latest Ref Rng & Units 09/25/2024    2:56 PM 08/26/2024    3:30 AM 08/23/2024    9:58 PM  CMP  Glucose 70 - 99 mg/dL 95  97  96   BUN 8 - 23 mg/dL 11  34  28  Creatinine 0.44 - 1.00 mg/dL 9.33  9.19  9.06   Sodium 135 - 145 mmol/L 141  137  137   Potassium 3.5 - 5.1 mmol/L 3.8  3.6  3.8   Chloride 98 - 111 mmol/L 106  106  105   CO2 22 - 32 mmol/L 25  18  21    Calcium  8.9 - 10.3 mg/dL 8.7  8.3  9.2   Total Protein 6.5 - 8.1 g/dL 6.6   6.7   Total Bilirubin 0.0 - 1.2 mg/dL 1.0   1.0   Alkaline Phos 38 - 126 U/L 79   54   AST 15 - 41 U/L 41   23   ALT 0 - 44 U/L 53   19     MR LUMBAR SPINE WO CONTRAST Addendum Date: 09/25/2024 ** ADDENDUM #1 ** ADDENDUM: New first impression should state: No significant interval change ---------------------------------------------------- Electronically signed by: Lonni Necessary MD 09/25/2024 06:15 PM EST RP Workstation: [id number]   Result Date: 09/25/2024 ** ORIGINAL REPORT **EXAM: MRI LUMBAR SPINE 09/25/2024 05:51:31 PM TECHNIQUE: Multiplanar multisequence MRI of the lumbar spine was performed without the administration of intravenous contrast.  COMPARISON: MRI of lumbar spine 07/28/2024. CLINICAL HISTORY: Laying in bed today, patient felt a pop in her back when an aide attempted to pick her up. Patient now reports inability to raise either leg off the bed and worsened back pain. FINDINGS: BONES AND ALIGNMENT: Normal alignment. Normal vertebral body heights. Heterogeneous fatty infiltration in the marrow is again noted. Hemangioma at L1 is stable. SPINAL CORD: The conus medullaris terminates at L1. SOFT TISSUES: No paraspinal mass. L1-L2: Broad based disc protrusion and moderate facet hypertrophy at L1-L2 crowds the nerve roots with moderate to severe central canal stenosis, similar to the prior exam. L2-L3: Broad based disc bulge is present at L2-L3 without significant stenosis or change. L3-L4: Rightward disc protrusion is present at L3-L4. Mild subarticular narrowing is worse on the left. Moderate right and mild left foraminal stenosis is present. L4-L5: New broad based disc protrusion at L4-L5 is stable. Advanced asymmetric left sided facet hypertrophy is stable. Mild left greater than right subarticular and foraminal stenosis is present and stable. L5-S1: No significant disc herniation. No spinal canal stenosis or neural foraminal narrowing. IMPRESSION: 1. There is significant interval change. 2. Moderate to severe central canal stenosis at L1-L2 due to broad-based disc protrusion and moderate facet hypertrophy, unchanged. 3. Rightward disc protrusion at L3-L4 with mild subarticular narrowing greater on the left and moderate right/mild left foraminal stenosis. 4. Broad-based disc protrusion at L4-L5 with advanced asymmetric left-sided facet hypertrophy and mild left greater than right subarticular and foraminal stenosis, unchanged. Electronically signed by: Lonni Necessary MD 09/25/2024 06:04 PM EST RP Workstation: [id number]   DG Chest Portable 1 View Result Date: 09/25/2024 CLINICAL DATA:  Fatigue, back pain, failure to thrive. EXAM: PORTABLE  CHEST 1 VIEW COMPARISON:  07/23/2024. FINDINGS: The heart size and mediastinal contours are within normal limits. Minimal subsegmental atelectasis or scarring is noted at the left lung base. No consolidation, effusion, or pneumothorax is seen. No acute osseous abnormality. IMPRESSION: No active disease. Electronically Signed   By: Leita Birmingham M.D.   On: 09/25/2024 17:27    ASSESSMENT/PLAN:  Assessment: Principal Problem:   Weakness of both lower extremities Active Problems:   Cerebral palsy (HCC)   Chronic pain due to cerebral palsy   Chronic diarrhea of unknown origin   Pressure injury of skin   Aeryn K Eckstein is  a 68 y.o. female with PMH of spastic cerebral palsy, chronic lumbar spinal stenosis, chronic pain syndrome, recent hospitalization 08/23/2024 for fall at home after early discharge from inpatient rehabilitation, recently treated singles in V1 (finished treatment 07/30/2024), HTN, hyperlipidemia, T2DM, anemia, CKD 3/mild azotemia, chronic diarrhea who presented with leg weakness and admitted for weakness of bilateral LE's and management of spastic cerebral palsy.   Plan: #Spastic cerebral palsy #Progressive weakness of bilateral lower extremities #Chronic back pain #Lumbar spinal stenosis - multiple admissions over the last few months with increasing weakness in the bilateral lower extremities.  -This has been thought to be 2/2 increased muscle relaxants (dantrolene ), a shingles infection, and lumbar stenosis found on MRI. Not interested in surgical options with NSGY but will likely not improve her symptoms greatly. This is likely her new baseline.  -P2P done, patient not approved for SNF. Will try to coordinate home health and DME supplies. Appreciate home health assistance with this.  -Continue pain management of Tylenol  1000mg  TID, flexeril  5mg  TID, Norco 5-325 q6 PRN - Continue dantrolene  100 mg 3 times daily.    #Stage 2 pressure injury-R buttock -Wound care  consulted -Continue pressure offloading -In setting of incontinence, ensure area stays clean    Best Practice: Diet: Regular diet IVF: Fluids: none, Rate: None VTE: rivaroxaban  (XARELTO ) tablet 10 mg Start: 09/26/24 1000 Code: Full  Disposition planning: Therapy Recs: home  DME: other pending DISPO: Pending DME supplies delivery   Signature:  Tifany Hirsch D'Mello, DO [facility]  10:47 AM, 10/01/2024  On Call pager [phone]

## 2024-10-01 NOTE — Progress Notes (Signed)
 RNCM met with patient at [facility] to offer choice for [facility] services, DME.  Patient with no preference so Jermaine at [facility] contacted with DME orders-hospital bed and hoyer lift.to be delivered to the home.  Patient and her sister live together per patient.  Sister's phone number also provided.  Called patient's sister and had to leave VM-asked to return call. Joane at [facility] contacted with [facility] orders-PT/OT/HH aide.  Confirmation of services received.  Patient in agreement to all services.

## 2024-10-01 NOTE — Plan of Care (Signed)
  Problem: Education: Goal: Knowledge of General Education information will improve Description: Including pain rating scale, medication(s)/side effects and non-pharmacologic comfort measures Outcome: Progressing   Problem: Clinical Measurements: Goal: Will remain free from infection Outcome: Progressing   Problem: Activity: Goal: Risk for activity intolerance will decrease Outcome: Progressing   Problem: Pain Managment: Goal: General experience of comfort will improve and/or be controlled Outcome: Progressing   Problem: Safety: Goal: Ability to remain free from injury will improve Outcome: Progressing

## 2024-10-02 DIAGNOSIS — L89312 Pressure ulcer of right buttock, stage 2: Secondary | ICD-10-CM | POA: Diagnosis not present

## 2024-10-02 DIAGNOSIS — Z8619 Personal history of other infectious and parasitic diseases: Secondary | ICD-10-CM | POA: Diagnosis not present

## 2024-10-02 DIAGNOSIS — M48062 Spinal stenosis, lumbar region with neurogenic claudication: Secondary | ICD-10-CM

## 2024-10-02 DIAGNOSIS — M5441 Lumbago with sciatica, right side: Secondary | ICD-10-CM

## 2024-10-02 DIAGNOSIS — M6281 Muscle weakness (generalized): Secondary | ICD-10-CM | POA: Diagnosis not present

## 2024-10-02 DIAGNOSIS — G800 Spastic quadriplegic cerebral palsy: Secondary | ICD-10-CM | POA: Diagnosis not present

## 2024-10-02 LAB — CBC WITH DIFFERENTIAL/PLATELET
Abs Immature Granulocytes: 0.06 K/uL (ref 0.00–0.07)
Basophils Absolute: 0 K/uL (ref 0.0–0.1)
Basophils Relative: 1 %
Eosinophils Absolute: 0.9 K/uL — ABNORMAL HIGH (ref 0.0–0.5)
Eosinophils Relative: 12 %
HCT: 35.1 % — ABNORMAL LOW (ref 36.0–46.0)
Hemoglobin: 11.6 g/dL — ABNORMAL LOW (ref 12.0–15.0)
Immature Granulocytes: 1 %
Lymphocytes Relative: 12 %
Lymphs Abs: 0.9 K/uL (ref 0.7–4.0)
MCH: 29.3 pg (ref 26.0–34.0)
MCHC: 33 g/dL (ref 30.0–36.0)
MCV: 88.6 fL (ref 80.0–100.0)
Monocytes Absolute: 0.4 K/uL (ref 0.1–1.0)
Monocytes Relative: 6 %
Neutro Abs: 4.8 K/uL (ref 1.7–7.7)
Neutrophils Relative %: 68 %
Platelets: 198 K/uL (ref 150–400)
RBC: 3.96 MIL/uL (ref 3.87–5.11)
RDW: 15 % (ref 11.5–15.5)
WBC: 7.1 K/uL (ref 4.0–10.5)
nRBC: 0 % (ref 0.0–0.2)

## 2024-10-02 LAB — BASIC METABOLIC PANEL WITH GFR
Anion gap: 10 (ref 5–15)
BUN: 24 mg/dL — ABNORMAL HIGH (ref 8–23)
CO2: 21 mmol/L — ABNORMAL LOW (ref 22–32)
Calcium: 7.9 mg/dL — ABNORMAL LOW (ref 8.9–10.3)
Chloride: 103 mmol/L (ref 98–111)
Creatinine, Ser: 0.62 mg/dL (ref 0.44–1.00)
GFR, Estimated: >60 mL/min (ref >60)
Glucose, Bld: 100 mg/dL — ABNORMAL HIGH (ref 70–99)
Potassium: 3.3 mmol/L — ABNORMAL LOW (ref 3.5–5.1)
Sodium: 134 mmol/L — ABNORMAL LOW (ref 135–145)

## 2024-10-02 MED ORDER — POTASSIUM CHLORIDE CRYS ER 20 MEQ PO TBCR
40.0000 meq | EXTENDED_RELEASE_TABLET | Freq: Once | ORAL | Status: AC
  Administered 2024-10-02: 40 meq via ORAL
  Filled 2024-10-02: qty 2

## 2024-10-02 MED ORDER — SORBITOL 70 % SOLN
30.0000 mL | Freq: Once | Status: AC
  Administered 2024-10-02: 30 mL via ORAL
  Filled 2024-10-02: qty 30

## 2024-10-02 MED ADMIN — Dantrolene Sodium Cap 100 MG: 100 mg | ORAL | NDC 00115443301

## 2024-10-02 NOTE — TOC Progression Note (Signed)
 Transition of Care (TOC) - Progression Note  Weekend coverage   Patient Details  Name: Kathy Owens MRN: [id number] Date of Birth: Apr 06, 1956  Transition of Care [facility]) CM/SW Contact  Charlann, Rayfield Hurst, RN Phone Number: 10/02/2024, 11:15 AM  Clinical Narrative:    Call made to Sister to check on DME delivery- per [facility] DME has not been delivered yet. She is still working on having someone come help her move furniture to make room for hospital bed/lift. She voiced that should be today and is requesting that DME be delivered tomorrow afternoon to make sure she has space ready. Address confirmed: [address] [place], [place]  Sister is also asking if [facility] for home has been approved ? CM does not see anything mentioned in previous notes about home [place]- explained to sister that this CM was not sure about that and msg sent to covering provider regarding [place] question. Typically Insurances do not cover [place] at home and if needed information can be provided to sister to follow up with the [facility] distributor for referral.   Msg sent to [facility] liaison regarding delivery request for tomorrow.    Expected Discharge Plan: Skilled Nursing Facility Barriers to Discharge: Insurance Authorization, SNF Pending bed offer               Expected Discharge Plan and Services       Living arrangements for the past 2 months: Single Family Home                                       Social Drivers of Health (SDOH) Interventions SDOH Screenings   Food Insecurity: No Food Insecurity (09/26/2024)  Housing: Low Risk (09/26/2024)  Transportation Needs: No Transportation Needs (09/26/2024)  Utilities: Not At Risk (09/26/2024)  Alcohol  Screen: Low Risk (06/17/2023)  Depression (PHQ2-9): Low Risk (05/05/2024)  Financial Resource Strain: Low Risk (06/17/2023)  Physical Activity: Insufficiently Active (06/17/2023)  Social Connections: Socially Isolated (09/26/2024)   Stress: No Stress Concern Present (06/17/2023)  Tobacco Use: Low Risk (09/25/2024)  Health Literacy: Adequate Health Literacy (06/17/2023)    Readmission Risk Interventions     No data to display

## 2024-10-02 NOTE — Plan of Care (Signed)
  Problem: Nutrition: Goal: Adequate nutrition will be maintained Outcome: Progressing   Problem: Elimination: Goal: Will not experience complications related to bowel motility Outcome: Progressing Goal: Will not experience complications related to urinary retention Outcome: Progressing   Problem: Pain Managment: Goal: General experience of comfort will improve and/or be controlled Outcome: Progressing   Problem: Safety: Goal: Ability to remain free from injury will improve Outcome: Progressing

## 2024-10-02 NOTE — Progress Notes (Addendum)
 HD#7 SUBJECTIVE:  Patient Summary:  Kathy Owens is a 68 y.o. 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   Overnight Events: NAEOV.  Interim History:   Seen and examined at bedside without acute complaints; spoke with the patients sister yesterday, who agrees to stay with the patient but requires delivery of a hospital bed and Hoyer lift to assist with home care. Today BMP notable for mild hypokalemia, which was repleted. Still constipated after the dose of miralax  yesterday, will work on BM today. OBJECTIVE:  Vital Signs: Vitals:   10/01/24 2354 10/02/24 0500 10/02/24 0606 10/02/24 0719  BP: 122/67  139/61 (!) 140/69  Pulse: 73  78 79  Resp: 18  18   Temp: (!) 97.4 F (36.3 C)  98 F (36.7 C) 98.1 F (36.7 C)  TempSrc: Oral  Oral Oral  SpO2: 98%  97% 97%  Weight:  71 kg    Height:       Supplemental O2: Room Air SpO2: 97 %  Filed Weights   09/26/24 0114 10/01/24 0500 10/02/24 0500  Weight: 71.2 kg 71.1 kg 71 kg     Intake/Output Summary (Last 24 hours) at 10/02/2024 1128 Last data filed at 10/02/2024 0606 Gross per 24 hour  Intake --  Output 1350 ml  Net -1350 ml   Net IO Since Admission: -1,684 mL [10/02/24 1128]  Physical Exam: Const: Awake, alert in NAD HENT: Normocephalic, atraumatic, mucus membranes moist Card: RRR, No MRG Resp: LCTAB, no increased work of breathing Abd: Soft, NTND, Bsx4 Extremities: some spasticity noted in bilateral feet but much improved from a couple of days ago    Patient Lines/Drains/Airways Status     Active Line/Drains/Airways     Name Placement date  Placement time Site Days   Peripheral IV 09/26/24 Anterior;Right Forearm 09/26/24  0000  Forearm  6   External Urinary Catheter 09/26/24  1400  --  6   Wound 08/04/24 2057 Irritant Contact Dermatitis Perineum Medial 08/04/24  2057  Perineum  59   Wound 08/04/24 2057 Irritant Contact Dermatitis Buttocks Medial 08/04/24  2057  Buttocks  59   Wound 09/26/24 0121 Pressure Injury Buttocks Right Stage 2 -  Partial thickness loss of dermis presenting as a shallow open injury with a red, pink wound bed without slough. 09/26/24  0121  Buttocks  6            Pertinent labs and imaging:     Latest Ref Rng & Units 10/02/2024    6:00 AM 09/25/2024    2:56 PM 08/23/2024    9:58 PM  CBC  WBC 4.0 - 10.5 K/uL 7.1  7.3  8.4   Hemoglobin 12.0 - 15.0 g/dL 88.3  86.2  86.8   Hematocrit 36.0 - 46.0 % 35.1  42.1  40.0   Platelets 150 - 400 K/uL 198  280  227        Latest Ref Rng & Units 10/02/2024    6:00 AM 09/25/2024    2:56 PM 08/26/2024    3:30 AM  CMP  Glucose 70 - 99 mg/dL 899  95  97   BUN 8 - 23 mg/dL 24  11  34  Creatinine 0.44 - 1.00 mg/dL 9.37  9.33  9.19   Sodium 135 - 145 mmol/L 134  141  137   Potassium 3.5 - 5.1 mmol/L 3.3  3.8  3.6   Chloride 98 - 111 mmol/L 103  106  106   CO2 22 - 32 mmol/L 21  25  18    Calcium  8.9 - 10.3 mg/dL 7.9  8.7  8.3   Total Protein 6.5 - 8.1 g/dL  6.6    Total Bilirubin 0.0 - 1.2 mg/dL  1.0    Alkaline Phos 38 - 126 U/L  79    AST 15 - 41 U/L  41    ALT 0 - 44 U/L  53      MR LUMBAR SPINE WO CONTRAST Addendum Date: 09/25/2024 ** ADDENDUM #1 ** ADDENDUM: New first impression should state: No significant interval change ---------------------------------------------------- Electronically signed by: Lonni Necessary MD 09/25/2024 06:15 PM EST RP Workstation: [id number]   Result Date: 09/25/2024 ** ORIGINAL REPORT **EXAM: MRI LUMBAR SPINE 09/25/2024 05:51:31 PM TECHNIQUE: Multiplanar multisequence MRI of the lumbar spine was performed without the  administration of intravenous contrast. COMPARISON: MRI of lumbar spine 07/28/2024. CLINICAL HISTORY: Laying in bed today, patient felt a pop in her back when an aide attempted to pick her up. Patient now reports inability to raise either leg off the bed and worsened back pain. FINDINGS: BONES AND ALIGNMENT: Normal alignment. Normal vertebral body heights. Heterogeneous fatty infiltration in the marrow is again noted. Hemangioma at L1 is stable. SPINAL CORD: The conus medullaris terminates at L1. SOFT TISSUES: No paraspinal mass. L1-L2: Broad based disc protrusion and moderate facet hypertrophy at L1-L2 crowds the nerve roots with moderate to severe central canal stenosis, similar to the prior exam. L2-L3: Broad based disc bulge is present at L2-L3 without significant stenosis or change. L3-L4: Rightward disc protrusion is present at L3-L4. Mild subarticular narrowing is worse on the left. Moderate right and mild left foraminal stenosis is present. L4-L5: New broad based disc protrusion at L4-L5 is stable. Advanced asymmetric left sided facet hypertrophy is stable. Mild left greater than right subarticular and foraminal stenosis is present and stable. L5-S1: No significant disc herniation. No spinal canal stenosis or neural foraminal narrowing. IMPRESSION: 1. There is significant interval change. 2. Moderate to severe central canal stenosis at L1-L2 due to broad-based disc protrusion and moderate facet hypertrophy, unchanged. 3. Rightward disc protrusion at L3-L4 with mild subarticular narrowing greater on the left and moderate right/mild left foraminal stenosis. 4. Broad-based disc protrusion at L4-L5 with advanced asymmetric left-sided facet hypertrophy and mild left greater than right subarticular and foraminal stenosis, unchanged. Electronically signed by: Lonni Necessary MD 09/25/2024 06:04 PM EST RP Workstation: [id number]   DG Chest Portable 1 View Result Date: 09/25/2024 CLINICAL DATA:  Fatigue, back  pain, failure to thrive. EXAM: PORTABLE CHEST 1 VIEW COMPARISON:  07/23/2024. FINDINGS: The heart size and mediastinal contours are within normal limits. Minimal subsegmental atelectasis or scarring is noted at the left lung base. No consolidation, effusion, or pneumothorax is seen. No acute osseous abnormality. IMPRESSION: No active disease. Electronically Signed   By: Leita Birmingham M.D.   On: 09/25/2024 17:27    ASSESSMENT/PLAN:  Assessment: Principal Problem:   Weakness of both lower extremities Active Problems:   Cerebral palsy (HCC)   Chronic pain due to cerebral palsy   Chronic diarrhea of unknown origin   Pressure injury of skin   Leg weakness, bilateral   Kathy Owens is  a 68 y.o. female with PMH of spastic cerebral palsy, chronic lumbar spinal stenosis, chronic pain syndrome, recent hospitalization 08/23/2024 for fall at home after early discharge from inpatient rehabilitation, recently treated singles in V1 (finished treatment 07/30/2024), HTN, hyperlipidemia, T2DM, anemia, CKD 3/mild azotemia, chronic diarrhea who presented with leg weakness and admitted for weakness of bilateral LE's and management of spastic cerebral palsy.   Plan: #Spastic cerebral palsy #Progressive weakness of bilateral lower extremities #Chronic back pain #Lumbar spinal stenosis - multiple admissions over the last few months with increasing weakness in the bilateral lower extremities.  -This has been thought to be 2/2 increased muscle relaxants (dantrolene ), a shingles infection, and lumbar stenosis found on MRI. Not interested in surgical options with NSGY but will likely not improve her symptoms greatly. This is likely her new baseline.  - P2P completed 12/12, patient not approved for SNF( did not achieve any benefit from recent SNF per discussion with MD from peer to peer) -Patient sister has agreed to stay with patient, DME for hospital bed and [facility] lift ordered, likely to be delivered 12/15.  In the  interim, patient sister will work on moving furniture to make room for the hospital bed/left, I anticipate discharge home tomorrow.  - Continue pain management of Tylenol  1000mg  TID, flexeril  5mg  TID, Norco 5-325 q6 PRN - Continue dantrolene  100 mg 3 times daily.    #Constipation - ordered sorbitol  70% solution.  #Stage 2 pressure injury-R buttock -Wound care consulted -Continue pressure offloading -In setting of incontinence, ensure area stays clean   Best Practice: Diet: Regular diet IVF: Fluids: none, Rate: None VTE: rivaroxaban  (XARELTO ) tablet 10 mg Start: 09/26/24 1000 Code: Full  Disposition planning: Therapy Recs: home  DME: other pending DISPO: Pending DME supplies delivery   Signature:  Tyjon Bowen, DO [facility]  11:28 AM, 10/02/2024

## 2024-10-02 NOTE — Plan of Care (Signed)

## 2024-10-03 ENCOUNTER — Telehealth: Payer: Self-pay | Admitting: *Deleted

## 2024-10-03 ENCOUNTER — Encounter: Attending: Physical Medicine and Rehabilitation | Admitting: Physical Medicine and Rehabilitation

## 2024-10-03 DIAGNOSIS — M6281 Muscle weakness (generalized): Secondary | ICD-10-CM | POA: Diagnosis not present

## 2024-10-03 LAB — BASIC METABOLIC PANEL WITH GFR
Anion gap: 9 (ref 5–15)
BUN: 24 mg/dL — ABNORMAL HIGH (ref 8–23)
CO2: 20 mmol/L — ABNORMAL LOW (ref 22–32)
Calcium: 8.2 mg/dL — ABNORMAL LOW (ref 8.9–10.3)
Chloride: 107 mmol/L (ref 98–111)
Creatinine, Ser: 0.65 mg/dL (ref 0.44–1.00)
GFR, Estimated: >60 mL/min (ref >60)
Glucose, Bld: 109 mg/dL — ABNORMAL HIGH (ref 70–99)
Potassium: 4.1 mmol/L (ref 3.5–5.1)
Sodium: 136 mmol/L (ref 135–145)

## 2024-10-03 MED ADMIN — Dantrolene Sodium Cap 100 MG: 100 mg | ORAL | @ 09:00:00 | NDC 00115443301

## 2024-10-03 MED ADMIN — Dantrolene Sodium Cap 100 MG: 100 mg | ORAL | @ 20:00:00 | NDC 00115443301

## 2024-10-03 MED ADMIN — Dantrolene Sodium Cap 100 MG: 100 mg | ORAL | @ 17:00:00 | NDC 00115443301

## 2024-10-03 NOTE — Discharge Instructions (Signed)
 You were hospitalized because of some weakness. We worked to adjust your dantrolene  dose to get it to a good dose for you.  Thank you for allowing us  to be part of your care.   Please arrange hospital follow-up with:  October 18, 2024 at 1:15 pm with Dr.Zheng   Please stop taking your xarelto , blood thinner   Continue take your dantrolene  100 mg 3 times a day  We have ordered a hospital bed and lift for you   Please call our clinic if you have any questions or concerns, we may be able to help and keep you from a long and expensive emergency room wait. Our clinic and after hours phone number is [phone], the best time to call is Monday through Friday 9 am to 4 pm but there is always someone available 24/7 if you have an emergency. If you need medication refills please notify your pharmacy one week in advance and they will send us  a request.

## 2024-10-03 NOTE — Telephone Encounter (Signed)
 Will froward to PCP to consider PT for patient.

## 2024-10-03 NOTE — TOC Progression Note (Signed)
 Transition of Care [facility]) - Progression Note    Patient Details  Name: Kathy Owens MRN: [id number] Date of Birth: 04/28/1956  [facility] CM/SW Contact  Nola Devere Hands, RN Phone Number: 10/03/2024, 11:07 AM  Clinical Narrative:    Case Manager contacted Jermaine with [employer] concerning deliery of DME. He states it will be delivered this afternoon. CM spoke with [name]- patient's sister, she states that she is having someone remove furniture this afternoon to prepare for equipment and requested patient be discharged in AM. CM conveyed this to MD. CM asked Daisy to let patient's nurse know when DME has been delivered. ICM will continue to follow.   Expected Discharge Plan: Skilled Nursing Facility Barriers to Discharge: Insurance Authorization, SNF Pending bed offer               Expected Discharge Plan and Services       Living arrangements for the past 2 months: Single Family Home                                       Social Drivers of Health (SDOH) Interventions SDOH Screenings   Food Insecurity: No Food Insecurity (09/26/2024)  Housing: Low Risk (09/26/2024)  Transportation Needs: No Transportation Needs (09/26/2024)  Utilities: Not At Risk (09/26/2024)  Alcohol  Screen: Low Risk (06/17/2023)  Depression (PHQ2-9): Low Risk (05/05/2024)  Financial Resource Strain: Low Risk (06/17/2023)  Physical Activity: Insufficiently Active (06/17/2023)  Social Connections: Socially Isolated (09/26/2024)  Stress: No Stress Concern Present (06/17/2023)  Tobacco Use: Low Risk (09/25/2024)  Health Literacy: Adequate Health Literacy (06/17/2023)    Readmission Risk Interventions     No data to display

## 2024-10-03 NOTE — Progress Notes (Signed)
 HD#2 SUBJECTIVE:  Patient Summary:  Kathy Owens is a 68 y.o. 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   Overnight Events: no overnight events   Interim History: Patient reports doing well this morning.  She states that her sister called this morning and thinks that tomorrow afternoon would be better served getting her home.  Will try to coordinate with case manager to see if DME equipment can be delivered today as patient is medically ready for discharge. OBJECTIVE:  Vital Signs: Vitals:   10/02/24 2348 10/03/24 0358 10/03/24 0500 10/03/24 0722  BP: 135/76 105/65  130/75  Pulse: 85 85  86  Resp: 16 18    Temp: 98 F (36.7 C) 98.4 F (36.9 C)  (!) 97.5 F (36.4 C)  TempSrc: Oral Oral  Oral  SpO2: 100% 95%  93%  Weight:   70 kg   Height:       Supplemental O2: Room Air SpO2: 93 %  Filed Weights   10/01/24 0500 10/02/24 0500 10/03/24 0500  Weight: 71.1 kg 71 kg 70 kg     Intake/Output Summary (Last 24 hours) at 10/03/2024 1047 Last data filed at 10/02/2024 2038 Gross per 24 hour  Intake 120 ml  Output 100 ml  Net 20 ml   Net IO Since Admission: -1,664 mL [10/03/24 1047]  Physical Exam: Const: Awake, alert in NAD HENT: Normocephalic, atraumatic, mucus membranes moist Card: RRR, No MRG Resp: LCTAB, no increased work of breathing Abd: Soft, NTND, Bsx4 Extremities: some spasticity noted in bilateral feet but much improved  Patient Lines/Drains/Airways Status     Active Line/Drains/Airways     Name Placement date Placement time Site Days   Peripheral IV 09/26/24 Anterior;Right Forearm 09/26/24   0000  Forearm  7   External Urinary Catheter 09/26/24  1400  --  7   Wound 08/04/24 2057 Irritant Contact Dermatitis Perineum Medial 08/04/24  2057  Perineum  60   Wound 08/04/24 2057 Irritant Contact Dermatitis Buttocks Medial 08/04/24  2057  Buttocks  60   Wound 09/26/24 0121 Pressure Injury Buttocks Right Stage 2 -  Partial thickness loss of dermis presenting as a shallow open injury with a red, pink wound bed without slough. 09/26/24  0121  Buttocks  7            Pertinent labs and imaging:     Latest Ref Rng & Units 10/02/2024    6:00 AM 09/25/2024    2:56 PM 08/23/2024    9:58 PM  CBC  WBC 4.0 - 10.5 K/uL 7.1  7.3  8.4   Hemoglobin 12.0 - 15.0 g/dL 88.3  86.2  86.8   Hematocrit 36.0 - 46.0 % 35.1  42.1  40.0   Platelets 150 - 400 K/uL 198  280  227        Latest Ref Rng & Units 10/03/2024    1:36 AM 10/02/2024    6:00 AM 09/25/2024    2:56 PM  CMP  Glucose 70 - 99 mg/dL 890  899  95   BUN 8 - 23 mg/dL 24  24  11    Creatinine 0.44 - 1.00 mg/dL 9.34  9.37  9.33   Sodium 135 -  145 mmol/L 136  134  141   Potassium 3.5 - 5.1 mmol/L 4.1  3.3  3.8   Chloride 98 - 111 mmol/L 107  103  106   CO2 22 - 32 mmol/L 20  21  25    Calcium  8.9 - 10.3 mg/dL 8.2  7.9  8.7   Total Protein 6.5 - 8.1 g/dL   6.6   Total Bilirubin 0.0 - 1.2 mg/dL   1.0   Alkaline Phos 38 - 126 U/L   79   AST 15 - 41 U/L   41   ALT 0 - 44 U/L   53     MR LUMBAR SPINE WO CONTRAST Addendum Date: 09/25/2024 ** ADDENDUM #1 ** ADDENDUM: New first impression should state: No significant interval change ---------------------------------------------------- Electronically signed by: Lonni Necessary MD 09/25/2024 06:15 PM EST RP Workstation: [id number]   Result Date: 09/25/2024 ** ORIGINAL REPORT **EXAM: MRI LUMBAR SPINE 09/25/2024 05:51:31 PM TECHNIQUE: Multiplanar multisequence MRI of the lumbar spine was performed without the administration of intravenous contrast. COMPARISON: MRI of lumbar spine 07/28/2024.  CLINICAL HISTORY: Laying in bed today, patient felt a pop in her back when an aide attempted to pick her up. Patient now reports inability to raise either leg off the bed and worsened back pain. FINDINGS: BONES AND ALIGNMENT: Normal alignment. Normal vertebral body heights. Heterogeneous fatty infiltration in the marrow is again noted. Hemangioma at L1 is stable. SPINAL CORD: The conus medullaris terminates at L1. SOFT TISSUES: No paraspinal mass. L1-L2: Broad based disc protrusion and moderate facet hypertrophy at L1-L2 crowds the nerve roots with moderate to severe central canal stenosis, similar to the prior exam. L2-L3: Broad based disc bulge is present at L2-L3 without significant stenosis or change. L3-L4: Rightward disc protrusion is present at L3-L4. Mild subarticular narrowing is worse on the left. Moderate right and mild left foraminal stenosis is present. L4-L5: New broad based disc protrusion at L4-L5 is stable. Advanced asymmetric left sided facet hypertrophy is stable. Mild left greater than right subarticular and foraminal stenosis is present and stable. L5-S1: No significant disc herniation. No spinal canal stenosis or neural foraminal narrowing. IMPRESSION: 1. There is significant interval change. 2. Moderate to severe central canal stenosis at L1-L2 due to broad-based disc protrusion and moderate facet hypertrophy, unchanged. 3. Rightward disc protrusion at L3-L4 with mild subarticular narrowing greater on the left and moderate right/mild left foraminal stenosis. 4. Broad-based disc protrusion at L4-L5 with advanced asymmetric left-sided facet hypertrophy and mild left greater than right subarticular and foraminal stenosis, unchanged. Electronically signed by: Lonni Necessary MD 09/25/2024 06:04 PM EST RP Workstation: [id number]   DG Chest Portable 1 View Result Date: 09/25/2024 CLINICAL DATA:  Fatigue, back pain, failure to thrive. EXAM: PORTABLE CHEST 1 VIEW COMPARISON:  07/23/2024.  FINDINGS: The heart size and mediastinal contours are within normal limits. Minimal subsegmental atelectasis or scarring is noted at the left lung base. No consolidation, effusion, or pneumothorax is seen. No acute osseous abnormality. IMPRESSION: No active disease. Electronically Signed   By: Leita Birmingham M.D.   On: 09/25/2024 17:27    ASSESSMENT/PLAN:  Assessment: Principal Problem:   Weakness of both lower extremities Active Problems:   Cerebral palsy (HCC)   Chronic pain due to cerebral palsy   Chronic diarrhea of unknown origin   Pressure injury of skin   Leg weakness, bilateral   Low back pain with bilateral sciatica   Kathy Owens is a 68 y.o. female with PMH of  spastic cerebral palsy, chronic lumbar spinal stenosis, chronic pain syndrome, recent hospitalization 08/23/2024 for fall at home after early discharge from inpatient rehabilitation, recently treated singles in V1 (finished treatment 07/30/2024), HTN, hyperlipidemia, T2DM, anemia, CKD 3/mild azotemia, chronic diarrhea who presented with leg weakness and admitted for weakness of bilateral LE's and management of spastic cerebral palsy.   Plan: #Spastic cerebral palsy #Progressive weakness of bilateral lower extremities #Chronic back pain #Lumbar spinal stenosis - multiple admissions over the last few months with increasing weakness in the bilateral lower extremities.  -This has been thought to be 2/2 increased muscle relaxants (dantrolene ), a shingles infection, and lumbar stenosis found on MRI. Not interested in surgical options with NSGY but will likely not improve her symptoms greatly. This is likely her new baseline.  -P2P done, patient not approved for SNF. Will try to coordinate home health and DME supplies. Appreciate case manager assistance with this.  -Continue pain management of Tylenol  1000mg  TID, flexeril  5mg  TID, Norco 5-325 q6 PRN - Continue dantrolene  100 mg 3 times daily.    #Stage 2 pressure injury-R  buttock -Wound care consulted -Continue pressure offloading -In setting of incontinence, ensure area stays clean    Best Practice: Diet: Regular diet IVF: Fluids: none, Rate: None VTE: rivaroxaban  (XARELTO ) tablet 10 mg Start: 09/26/24 1000 Code: Full  Disposition planning: Therapy Recs: home  DME: other pending DISPO: Pending DME supplies delivery   Signature:  Keddrick Wyne D'Mello, DO [facility]  10:47 AM, 10/03/2024  On Call pager [phone]

## 2024-10-04 DIAGNOSIS — M6281 Muscle weakness (generalized): Secondary | ICD-10-CM | POA: Diagnosis not present

## 2024-10-04 MED ADMIN — Dantrolene Sodium Cap 100 MG: 100 mg | ORAL | @ 22:00:00 | NDC 00115443301

## 2024-10-04 MED ADMIN — Dantrolene Sodium Cap 100 MG: 100 mg | ORAL | @ 17:00:00 | NDC 00115443301

## 2024-10-04 MED ADMIN — Dantrolene Sodium Cap 100 MG: 100 mg | ORAL | @ 09:00:00 | NDC 00115443301

## 2024-10-04 NOTE — Plan of Care (Signed)

## 2024-10-04 NOTE — Discharge Summary (Incomplete)
 Name: Kathy Owens MRN: [id number] DOB: 01/29/1956 68 y.o. PCP: Trudy Mliss Dragon, MD  Date of Admission: 09/25/2024  1:49 PM Date of Discharge: 10/04/2024 Attending Physician: Dr. MICAEL Riis Winfrey  Discharge Diagnosis: 1. Principal Problem:   Weakness of both lower extremities Active Problems:   Cerebral palsy (HCC)   Chronic pain due to cerebral palsy   Chronic diarrhea of unknown origin   Pressure injury of skin   Leg weakness, bilateral   Low back pain with bilateral sciatica    Discharge Medications: Allergies as of 10/04/2024       Reactions   Firvanq  [vancomycin ] Hives   PO vancomycin  for C.diff (not IV).  TDD 11/09/18.   Ms Contin [morphine] Other (See Comments)   Hallucinations   Lioresal  [baclofen ] Other (See Comments)   Hallucinations    Nsaids Other (See Comments)   Hx kidney disease   Zanaflex  [tizanidine ] Other (See Comments)   Hallucinations         Medication List     PAUSE taking these medications    triamterene -hydrochlorothiazide  37.5-25 MG tablet Wait to take this until your doctor or other care provider tells you to start again. Commonly known as: MAXZIDE -25 Take 1 tablet by mouth daily.       STOP taking these medications    Xarelto  10 MG Tabs tablet Generic drug: rivaroxaban        TAKE these medications    acetaminophen  500 MG tablet Commonly known as: TYLENOL  Take 500 mg by mouth every 6 (six) hours as needed (Pain).   alendronate  70 MG tablet Commonly known as: Fosamax  Take 1 tablet (70 mg total) by mouth every 7 (seven) days. Take with a full glass of water on an empty stomach. What changed:  when to take this additional instructions   artificial tears Oint ophthalmic ointment Commonly known as: LACRILUBE Place into the left eye 3 (three) times daily as needed for dry eyes.   atorvastatin  10 MG tablet Commonly known as: LIPITOR TAKE 1 TABLET(10 MG) BY MOUTH DAILY What changed:  how much to take how  to take this when to take this additional instructions   cyanocobalamin  500 MCG tablet Commonly known as: VITAMIN B12 Take 500 mcg by mouth in the morning.   cyclobenzaprine  5 MG tablet Commonly known as: FLEXERIL  Take 1 tablet (5 mg total) by mouth 3 (three) times daily.   dantrolene  100 MG capsule Commonly known as: DANTRIUM  Take 1 capsule (100 mg total) by mouth 3 (three) times daily.   feeding supplement Liqd Take 237 mLs by mouth 2 (two) times daily between meals.   FeroSul 325 (65 Fe) MG tablet Generic drug: ferrous sulfate  Take 1 tablet (325 mg total) by mouth daily with breakfast.   Gerhardt's butt cream Crea Apply 1 Application topically 2 (two) times daily.   hydrocerin Crea Apply 1 Application topically 2 (two) times daily.   HYDROcodone -acetaminophen  5-325 MG tablet Commonly known as: NORCO/VICODIN Take 1 tablet by mouth every 6 (six) hours as needed for moderate pain (pain score 4-6) or severe pain (pain score 7-10).   loperamide  2 MG capsule Commonly known as: IMODIUM  TAKE 1 CAPSULE(2 MG) BY MOUTH DAILY FOR UP TO 360 DOSES AS NEEDED FOR DIARRHEA OR LOOSE STOOLS   traZODone  50 MG tablet Commonly known as: DESYREL  Take 1 tablet (50 mg total) by mouth at bedtime as needed for sleep. What changed: how much to take  Durable Medical Equipment  (From admission, onward)           Start     Ordered   10/01/24 0925  For home use only DME Other see comment  Once       Comments: Deitra lift  Question:  Length of Need  Answer:  12 Months   10/01/24 0924   10/01/24 0906  For home use only DME Hospital bed  Once       Question Answer Comment  Length of Need 12 Months   Bed type Semi-electric      10/01/24 9093            Disposition and follow-up:   Ms.Chelsie K Banghart was discharged from [facility] in Stable condition.  At the hospital follow up visit please address:  1.  Xarelto - Was started by PM&R at time of  discharge on 11/4. Likely due to stasis. Did not see need for this at this time but can reconsider in clinic  Paperwork- Patient was supposed to have paperwork from Dr. Trudy filled out so she could live with sister. She was not able to get it. Unsure where this paperwork is   2.  Labs / imaging needed at time of follow-up: none  3.  Pending labs/ test needing follow-up: none  Follow-up Appointments:  Contact information for follow-up providers     [facility] (DME) Follow up.   Specialty: DME Services Why: Referral sent for Hospital bed and hoyer lift- to be delivered to the home Contact information: [address] [place]  [place]  [address] [phone]             Contact information for after-discharge care     Destination     [facility] Preferred SNF .   Service: Skilled Nursing Contact information: [address] [place]  [phone]             Home Medical Care     [facility]) .   Service: Home Health Services Why: HHPT/OT/aide arranged- they will contact you to schedule Contact information: [address] [place]  [address] [phone]                      Hospital Course by problem list:   Betsaida K Sokolowski is a 68 y.o. female with PMH of spastic cerebral palsy, chronic lumbar spinal stenosis, chronic pain syndrome, recent hospitalization 08/23/2024 for fall at home after early discharge from inpatient rehabilitation, recently treated singles in V1 (finished treatment 07/30/2024), HTN, hyperlipidemia, T2DM, anemia, CKD 3/mild azotemia, chronic diarrhea who presented with leg weakness and admitted for weakness of bilateral LE's and management of spastic cerebral palsy    #Spastic cerebral palsy #Progressive weakness of bilateral lower extremities #Chronic back pain #Lumbar spinal  stenosis History of progressive weakness over several months, presenting after a transfer assisted by her sister during which she felt a pop in her back with spinal rotation/twisting, followed by increased back pain and perceived worsening lower extremity weakness. MRI demonstrated no acute or new abnormalities, with only chronic spinal stenosis noted; Neurosurgery evaluated and noted L1-L2 stenosis unchanged from imaging two months prior and did not feel that radiographic findings explained her symptoms or that surgical decompression would provide clinical benefit. PT/OT recommended SNF placement, but this was denied after peer-to-peer review, and the patients sister is agreeable to home discharge with a  hospital bed and [facility] lift ordered for assistance. - Continue multimodal pain control with cyclobenzaprine  5 mg TID, dantrolene  100 mg TID, and scheduled acetaminophen  1,000 mg TID.  Stage 2 pressure injury, right buttock: Evaluated by wound care; continued pressure offloading   Hyperlipidemia: Stable; continued atorvastatin  10 mg daily.    Discharge Subjective:  Patient states that she is ready to go home. Was told by sister that DME equipment is not going to be delivered today but did clarify with case manager that it will be delivered today. Patient and sister are agreeable to discharge today once equipment is delivered.    Discharge Exam:   BP (!) 142/63 (BP Location: Left Wrist)   Pulse 85   Temp 98 F (36.7 C)   Resp 16   Ht 5' (1.524 m)   Wt 71.9 kg   SpO2 96%   BMI 30.96 kg/m  Discharge exam:  Const: Awake, alert in NAD HENT: Normocephalic, atraumatic, mucus membranes moist Card: RRR, No MRG Resp: LCTAB, no increased work of breathing Abd: Soft, NTND, Bsx4 Extremities: spasticity noted in bilateral lower extremities but unchanged for the last few days   Pertinent Labs, Studies, and Procedures:     Latest Ref Rng & Units 10/02/2024    6:00 AM 09/25/2024    2:56 PM  08/23/2024    9:58 PM  CBC  WBC 4.0 - 10.5 K/uL 7.1  7.3  8.4   Hemoglobin 12.0 - 15.0 g/dL 88.3  86.2  86.8   Hematocrit 36.0 - 46.0 % 35.1  42.1  40.0   Platelets 150 - 400 K/uL 198  280  227        Latest Ref Rng & Units 10/03/2024    1:36 AM 10/02/2024    6:00 AM 09/25/2024    2:56 PM  CMP  Glucose 70 - 99 mg/dL 890  899  95   BUN 8 - 23 mg/dL 24  24  11    Creatinine 0.44 - 1.00 mg/dL 9.34  9.37  9.33   Sodium 135 - 145 mmol/L 136  134  141   Potassium 3.5 - 5.1 mmol/L 4.1  3.3  3.8   Chloride 98 - 111 mmol/L 107  103  106   CO2 22 - 32 mmol/L 20  21  25    Calcium  8.9 - 10.3 mg/dL 8.2  7.9  8.7   Total Protein 6.5 - 8.1 g/dL   6.6   Total Bilirubin 0.0 - 1.2 mg/dL   1.0   Alkaline Phos 38 - 126 U/L   79   AST 15 - 41 U/L   41   ALT 0 - 44 U/L   53     MR LUMBAR SPINE WO CONTRAST Addendum Date: 09/25/2024 ***** ADDENDUM #1 ***** ADDENDUM: New first impression should state: No significant interval change ---------------------------------------------------- Electronically signed by: Lonni Necessary MD 09/25/2024 06:15 PM EST RP Workstation: [id number]   Result Date: 09/25/2024 ***** ORIGINAL REPORT ***** EXAM: MRI LUMBAR SPINE 09/25/2024 05:51:31 PM TECHNIQUE: Multiplanar multisequence MRI of the lumbar spine was performed without the administration of intravenous contrast. COMPARISON: MRI of lumbar spine 07/28/2024. CLINICAL HISTORY: Laying in bed today, patient felt a pop in her back when an aide attempted to pick her up. Patient now reports inability to raise either leg off the bed and worsened back pain. FINDINGS: BONES AND ALIGNMENT: Normal alignment. Normal vertebral body heights. Heterogeneous fatty infiltration in the marrow is again noted. Hemangioma at L1 is stable. SPINAL  CORD: The conus medullaris terminates at L1. SOFT TISSUES: No paraspinal mass. L1-L2: Broad based disc protrusion and moderate facet hypertrophy at L1-L2 crowds the nerve roots with moderate to severe  central canal stenosis, similar to the prior exam. L2-L3: Broad based disc bulge is present at L2-L3 without significant stenosis or change. L3-L4: Rightward disc protrusion is present at L3-L4. Mild subarticular narrowing is worse on the left. Moderate right and mild left foraminal stenosis is present. L4-L5: New broad based disc protrusion at L4-L5 is stable. Advanced asymmetric left sided facet hypertrophy is stable. Mild left greater than right subarticular and foraminal stenosis is present and stable. L5-S1: No significant disc herniation. No spinal canal stenosis or neural foraminal narrowing. IMPRESSION: 1. There is significant interval change. 2. Moderate to severe central canal stenosis at L1-L2 due to broad-based disc protrusion and moderate facet hypertrophy, unchanged. 3. Rightward disc protrusion at L3-L4 with mild subarticular narrowing greater on the left and moderate right/mild left foraminal stenosis. 4. Broad-based disc protrusion at L4-L5 with advanced asymmetric left-sided facet hypertrophy and mild left greater than right subarticular and foraminal stenosis, unchanged. Electronically signed by: Lonni Necessary MD 09/25/2024 06:04 PM EST RP Workstation: [id number]   DG Chest Portable 1 View Result Date: 09/25/2024 CLINICAL DATA:  Fatigue, back pain, failure to thrive. EXAM: PORTABLE CHEST 1 VIEW COMPARISON:  07/23/2024. FINDINGS: The heart size and mediastinal contours are within normal limits. Minimal subsegmental atelectasis or scarring is noted at the left lung base. No consolidation, effusion, or pneumothorax is seen. No acute osseous abnormality. IMPRESSION: No active disease. Electronically Signed   By: Leita Birmingham M.D.   On: 09/25/2024 17:27     Discharge Instructions:   Signed: D'Mello, Amar Keenum, DO 10/04/2024, 11:37 AM

## 2024-10-04 NOTE — Progress Notes (Signed)
 Physical Therapy Treatment Patient Details Name: Kathy Owens MRN: [id number] DOB: May 06, 1956 Today's Date: 10/04/2024   History of Present Illness PT is a 68 yo female admitted 12/7 following a pop in her back while sister was bathing her in bed and pt has LE weakness (chronic). Pt was in [facility] hospital s/p fall in Oct and was on acute rehab from 10/16-11/4. Pt went home after declining further rehab and readmitted same day as d/c after a fall.  Pt went to [facility] after that fall and was just d/c'd home 12/5 and now readmitted. MRI L1-2 canal stenosis and L3-4, L4-5 disc protrusion. PMH: spastic cerebral palsy, chronic pain syndrome, shingles, limbar spinal stenosis, CKD, T2DM, HTN, chronic diarrhea, anterior ischemic optic neuropathy.    PT Comments  Pt received in supine and agreeable to session. Session focused on bed mobility for increased pt independence once returned home. Pt educated on instructing her sister with assist techniques due to her sister not being present for instruction. Pt limited by BLE/BUE increased tone, but is able to reach to rails with increased assist for RUE. Pt able to sit to EOB with min-max A for balance due to posterior lean, most likely due to increased tone. Attempted to improve B hand placement for improved stability, but unable. Pt voiced concerns about trusting her sister to use the hoyer at home, so encouraged pt to have home PT demonstrate for pt and her sister. Pt continues to benefit from PT services to progress toward functional mobility goals.    If plan is discharge home, recommend the following: Two people to help with walking and/or transfers;Two people to help with bathing/dressing/bathroom;Assistance with cooking/housework;Assist for transportation;Help with stairs or ramp for entrance   Can travel by private vehicle     No  Equipment Recommendations  Hospital bed;Hoyer lift    Recommendations for Other Services       Precautions /  Restrictions Precautions Precautions: Fall Recall of Precautions/Restrictions: Impaired Precaution/Restrictions Comments: poor memory/historian Restrictions Weight Bearing Restrictions Per Provider Order: No Other Position/Activity Restrictions: Pt with significant contractures and increased tone     Mobility  Bed Mobility Overal bed mobility: Needs Assistance Bed Mobility: Supine to Sit, Sit to Supine, Rolling Rolling: Mod assist, Used rails   Supine to sit: Max assist, HOB elevated, Used rails Sit to supine: Used rails, Mod assist   General bed mobility comments: Assist for all aspects. Cues for sequencing and reaching across to rail. Rolling L/R for pericare. Pt unable to flex B knees    Transfers                        Ambulation/Gait                   Stairs             Wheelchair Mobility     Tilt Bed    Modified Rankin (Stroke Patients Only)       Balance Overall balance assessment: Needs assistance Sitting-balance support: Feet supported, Single extremity supported, Bilateral upper extremity supported Sitting balance-Leahy Scale: Poor Sitting balance - Comments: Posterior and L lateral leans sitting EOB requiring min-max A to maintain balance       Standing balance comment: nt                            Communication Communication Communication: Impaired Factors Affecting Communication: Hearing impaired  Cognition  Arousal: Alert Behavior During Therapy: WFL for tasks assessed/performed   PT - Cognitive impairments: No family/caregiver present to determine baseline, Awareness, Problem solving                         Following commands: Intact      Cueing Cueing Techniques: Verbal cues  Exercises      General Comments        Pertinent Vitals/Pain Pain Assessment Pain Assessment: No/denies pain     PT Goals (current goals can now be found in the care plan section) Acute Rehab PT  Goals Patient Stated Goal: Walk again PT Goal Formulation: With patient Time For Goal Achievement: 10/10/24 Progress towards PT goals: Progressing toward goals    Frequency    Min 2X/week       AM-PAC PT 6 Clicks Mobility   Outcome Measure  Help needed turning from your back to your side while in a flat bed without using bedrails?: A Lot   Help needed moving to and from a bed to a chair (including a wheelchair)?: A Lot Help needed standing up from a chair using your arms (e.g., wheelchair or bedside chair)?: A Lot Help needed to walk in hospital room?: Total Help needed climbing 3-5 steps with a railing? : Total 6 Click Score: 8    End of Session   Activity Tolerance: Patient tolerated treatment well Patient left: in bed;with call bell/phone within reach;with nursing/sitter in room Nurse Communication: Mobility status PT Visit Diagnosis: Unsteadiness on feet (R26.81);Other abnormalities of gait and mobility (R26.89);Muscle weakness (generalized) (M62.81);History of falling (Z91.81);Difficulty in walking, not elsewhere classified (R26.2);Other symptoms and signs involving the nervous system (R29.898)     Time: 8590-8556 PT Time Calculation (min) (ACUTE ONLY): 34 min  Charges:    $Therapeutic Activity: 23-37 mins PT General Charges $$ ACUTE PT VISIT: 1 Visit                    Darryle George, PTA Acute Rehabilitation Services Secure Chat Preferred  [phone]    Darryle George 10/04/2024, 3:18 PM

## 2024-10-04 NOTE — TOC Progression Note (Signed)
 Transition of Care [facility]) - Progression Note    Patient Details  Name: Kathy Owens MRN: [id number] Date of Birth: 23-Dec-1955  Transition of Care [facility]) CM/SW Contact  Marval Gell, RN Phone Number: 10/04/2024, 2:04 PM  Clinical Narrative:     0900 Spoke to patient's sister to see if DME had been delivered, it had not. Reached out to Jermaine w [employer], he will set up delivery for today, spoke w sister again and she is instructed to call me when DME is delivered.   12:00 Checked in [employer], DME not delivered yet, ensured it will be delivered today  1400 Spoke w sister, she states DME not delivered yet, and claims she has an emergency that will keep her out of the house for the next 3 hours, asked if there were any neighbors that would be able to accept the delivery, she declined. Updated resident. Spoke w Jermaine to see if he could contact the sister to schedule a late or after hours delivery, or a time early in the morning tomorrow. If sister evades delivery of equipment tomorrow will include TOC leadership.   Expected Discharge Plan: Skilled Nursing Facility Barriers to Discharge: Insurance Authorization, SNF Pending bed offer               Expected Discharge Plan and Services       Living arrangements for the past 2 months: Single Family Home                                       Social Drivers of Health (SDOH) Interventions SDOH Screenings   Food Insecurity: No Food Insecurity (09/26/2024)  Housing: Low Risk (09/26/2024)  Transportation Needs: No Transportation Needs (09/26/2024)  Utilities: Not At Risk (09/26/2024)  Alcohol  Screen: Low Risk (06/17/2023)  Depression (PHQ2-9): Low Risk (05/05/2024)  Financial Resource Strain: Low Risk (06/17/2023)  Physical Activity: Insufficiently Active (06/17/2023)  Social Connections: Socially Isolated (09/26/2024)  Stress: No Stress Concern Present (06/17/2023)  Tobacco Use: Low Risk (09/25/2024)  Health Literacy: Adequate  Health Literacy (06/17/2023)    Readmission Risk Interventions     No data to display

## 2024-10-04 NOTE — Progress Notes (Signed)
 HD#2 SUBJECTIVE:  Patient Summary:  Kathy Owens is a 68 y.o. 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   Overnight Events: no overnight events   Interim History: Patient states that she is ready to go home. Was told by sister that DME equipment is not going to be delivered today but did clarify with case manager that it will be delivered today. Patient and sister are agreeable to discharge today once equipment is delivered.  OBJECTIVE:  Vital Signs: Vitals:   10/04/24 0416 10/04/24 0419 10/04/24 0833 10/04/24 1149  BP: 115/68  (!) 142/63 (!) 141/66  Pulse: 81  85 92  Resp: 16     Temp: 97.6 F (36.4 C)  98 F (36.7 C)   TempSrc: Oral     SpO2: 96%  96% 95%  Weight:  71.9 kg    Height:       Supplemental O2: Room Air SpO2: 95 %  Filed Weights   10/02/24 0500 10/03/24 0500 10/04/24 0419  Weight: 71 kg 70 kg 71.9 kg     Intake/Output Summary (Last 24 hours) at 10/04/2024 1331 Last data filed at 10/04/2024 0400 Gross per 24 hour  Intake 420 ml  Output 400 ml  Net 20 ml   Net IO Since Admission: -1,644 mL [10/04/24 1331]  Physical Exam: Const: Awake, alert in NAD HENT: Normocephalic, atraumatic, mucus membranes moist Card: RRR, No MRG Resp: LCTAB, no increased work of breathing Abd: Soft, NTND, Bsx4 Extremities: spasticity noted in bilateral lower extremities but unchanged for the last few days   Patient Lines/Drains/Airways Status     Active Line/Drains/Airways     Name Placement date Placement time Site Days   Peripheral IV 09/26/24 Anterior;Right Forearm 09/26/24  0000  Forearm  8   External  Urinary Catheter 09/26/24  1400  --  8   Wound 09/26/24 0121 Pressure Injury Buttocks Right Stage 2 -  Partial thickness loss of dermis presenting as a shallow open injury with a red, pink wound bed without slough. 09/26/24  0121  Buttocks  8            Pertinent labs and imaging:     Latest Ref Rng & Units 10/02/2024    6:00 AM 09/25/2024    2:56 PM 08/23/2024    9:58 PM  CBC  WBC 4.0 - 10.5 K/uL 7.1  7.3  8.4   Hemoglobin 12.0 - 15.0 g/dL 88.3  86.2  86.8   Hematocrit 36.0 - 46.0 % 35.1  42.1  40.0   Platelets 150 - 400 K/uL 198  280  227        Latest Ref Rng & Units 10/03/2024    1:36 AM 10/02/2024    6:00 AM 09/25/2024    2:56 PM  CMP  Glucose 70 - 99 mg/dL 890  899  95   BUN 8 - 23 mg/dL 24  24  11    Creatinine 0.44 - 1.00 mg/dL 9.34  9.37  9.33   Sodium 135 - 145 mmol/L 136  134  141   Potassium 3.5 - 5.1 mmol/L 4.1  3.3  3.8   Chloride 98 - 111 mmol/L 107  103  106  CO2 22 - 32 mmol/L 20  21  25    Calcium  8.9 - 10.3 mg/dL 8.2  7.9  8.7   Total Protein 6.5 - 8.1 g/dL   6.6   Total Bilirubin 0.0 - 1.2 mg/dL   1.0   Alkaline Phos 38 - 126 U/L   79   AST 15 - 41 U/L   41   ALT 0 - 44 U/L   53     MR LUMBAR SPINE WO CONTRAST Addendum Date: 09/25/2024 ** ADDENDUM #1 ** ADDENDUM: New first impression should state: No significant interval change ---------------------------------------------------- Electronically signed by: Lonni Necessary MD 09/25/2024 06:15 PM EST RP Workstation: [id number]   Result Date: 09/25/2024 ** ORIGINAL REPORT **EXAM: MRI LUMBAR SPINE 09/25/2024 05:51:31 PM TECHNIQUE: Multiplanar multisequence MRI of the lumbar spine was performed without the administration of intravenous contrast. COMPARISON: MRI of lumbar spine 07/28/2024. CLINICAL HISTORY: Laying in bed today, patient felt a pop in her back when an aide attempted to pick her up. Patient now reports inability to raise either leg off the bed and worsened back pain. FINDINGS: BONES AND  ALIGNMENT: Normal alignment. Normal vertebral body heights. Heterogeneous fatty infiltration in the marrow is again noted. Hemangioma at L1 is stable. SPINAL CORD: The conus medullaris terminates at L1. SOFT TISSUES: No paraspinal mass. L1-L2: Broad based disc protrusion and moderate facet hypertrophy at L1-L2 crowds the nerve roots with moderate to severe central canal stenosis, similar to the prior exam. L2-L3: Broad based disc bulge is present at L2-L3 without significant stenosis or change. L3-L4: Rightward disc protrusion is present at L3-L4. Mild subarticular narrowing is worse on the left. Moderate right and mild left foraminal stenosis is present. L4-L5: New broad based disc protrusion at L4-L5 is stable. Advanced asymmetric left sided facet hypertrophy is stable. Mild left greater than right subarticular and foraminal stenosis is present and stable. L5-S1: No significant disc herniation. No spinal canal stenosis or neural foraminal narrowing. IMPRESSION: 1. There is significant interval change. 2. Moderate to severe central canal stenosis at L1-L2 due to broad-based disc protrusion and moderate facet hypertrophy, unchanged. 3. Rightward disc protrusion at L3-L4 with mild subarticular narrowing greater on the left and moderate right/mild left foraminal stenosis. 4. Broad-based disc protrusion at L4-L5 with advanced asymmetric left-sided facet hypertrophy and mild left greater than right subarticular and foraminal stenosis, unchanged. Electronically signed by: Lonni Necessary MD 09/25/2024 06:04 PM EST RP Workstation: [id number]   DG Chest Portable 1 View Result Date: 09/25/2024 CLINICAL DATA:  Fatigue, back pain, failure to thrive. EXAM: PORTABLE CHEST 1 VIEW COMPARISON:  07/23/2024. FINDINGS: The heart size and mediastinal contours are within normal limits. Minimal subsegmental atelectasis or scarring is noted at the left lung base. No consolidation, effusion, or pneumothorax is seen. No acute  osseous abnormality. IMPRESSION: No active disease. Electronically Signed   By: Leita Birmingham M.D.   On: 09/25/2024 17:27    ASSESSMENT/PLAN:  Assessment: Principal Problem:   Weakness of both lower extremities Active Problems:   Cerebral palsy (HCC)   Chronic pain due to cerebral palsy   Chronic diarrhea of unknown origin   Pressure injury of skin   Leg weakness, bilateral   Low back pain with bilateral sciatica   Kathy Owens is a 68 y.o. female with PMH of spastic cerebral palsy, chronic lumbar spinal stenosis, chronic pain syndrome, recent hospitalization 08/23/2024 for fall at home after early discharge from inpatient rehabilitation, recently treated singles in V1 (finished treatment 07/30/2024), HTN, hyperlipidemia,  T2DM, anemia, CKD 3/mild azotemia, chronic diarrhea who presented with leg weakness and admitted for weakness of bilateral LE's and management of spastic cerebral palsy.   Plan: #Spastic cerebral palsy #Progressive weakness of bilateral lower extremities #Chronic back pain #Lumbar spinal stenosis - multiple admissions over the last few months with increasing weakness in the bilateral lower extremities.  -This has been thought to be 2/2 increased muscle relaxants (dantrolene ), a shingles infection, and lumbar stenosis found on MRI. Not interested in surgical options with NSGY but will likely not improve her symptoms greatly. This is likely her new baseline.  -P2P done, patient not approved for SNF. Will try to coordinate home health and DME supplies. Appreciate case manager assistance with this.  -Continue pain management of Tylenol  1000mg  TID, flexeril  5mg  TID, Norco 5-325 q6 PRN - Continue dantrolene  100 mg 3 times daily.    #Stage 2 pressure injury-R buttock -Wound care consulted -Continue pressure offloading -In setting of incontinence, ensure area stays clean    Best Practice: Diet: Regular diet IVF: Fluids: none, Rate: None VTE: rivaroxaban  (XARELTO )  tablet 10 mg Start: 09/26/24 1000 Code: Full  Disposition planning: Therapy Recs: home  DME: other pending DISPO: Pending DME supplies delivery, MEDICALLY READY for discharge  Signature:  Remonia Romano, DO [facility]  1:31 PM, 10/04/2024  On Call pager [phone]

## 2024-10-05 DIAGNOSIS — G809 Cerebral palsy, unspecified: Secondary | ICD-10-CM | POA: Diagnosis not present

## 2024-10-05 DIAGNOSIS — M6281 Muscle weakness (generalized): Secondary | ICD-10-CM | POA: Diagnosis not present

## 2024-10-05 DIAGNOSIS — M5441 Lumbago with sciatica, right side: Secondary | ICD-10-CM | POA: Diagnosis not present

## 2024-10-05 DIAGNOSIS — R29898 Other symptoms and signs involving the musculoskeletal system: Secondary | ICD-10-CM | POA: Diagnosis not present

## 2024-10-05 DIAGNOSIS — M5442 Lumbago with sciatica, left side: Secondary | ICD-10-CM | POA: Diagnosis not present

## 2024-10-05 MED ADMIN — Dantrolene Sodium Cap 100 MG: 100 mg | ORAL | @ 15:00:00 | NDC 00115443301

## 2024-10-05 MED ADMIN — Dantrolene Sodium Cap 100 MG: 100 mg | ORAL | @ 09:00:00 | NDC 00115443301

## 2024-10-05 NOTE — Plan of Care (Signed)
  Problem: Nutrition: Goal: Adequate nutrition will be maintained Outcome: Progressing   Problem: Education: Goal: Knowledge of General Education information will improve Description: Including pain rating scale, medication(s)/side effects and non-pharmacologic comfort measures Outcome: Not Progressing   Problem: Activity: Goal: Risk for activity intolerance will decrease Outcome: Not Progressing   

## 2024-10-05 NOTE — TOC Transition Note (Addendum)
 Transition of Care [facility]) - Discharge Note   Patient Details  Name: Kathy Owens MRN: [id number] Date of Birth: 1956/01/02  [facility] CM/SW Contact:  Marval Gell, RN Phone Number: 10/05/2024, 8:19 AM   Clinical Narrative:     Spoke to sister [name] who confirms that she will be available for delivery of equipment today. Conformed her address for PT[facility] transport. Reached out to [employer] to set up delivery with Daisy. Daisy instructed to call me back when DME delivered.    16:50 Spoke w Jalonnee at [employer], he states that the delivery driver is 45 minutes out. Jalonee said he will reach back out to me when set up is complete. I will call 2W RN station call PT[facility]. I have spoken with the unit secretary that I will call her when equipment delivered and she has the number and packet for [facility] at the desk.    [place],Daisy Sister, Emergency Contact [phone]      Barriers to Discharge: Insurance Authorization, SNF Pending bed offer   Patient Goals and CMS Choice     Choice offered to / list presented to : Patient      Discharge Placement                       Discharge Plan and Services Additional resources added to the After Visit Summary for                                       Social Drivers of Health (SDOH) Interventions SDOH Screenings   Food Insecurity: No Food Insecurity (09/26/2024)  Housing: Low Risk (09/26/2024)  Transportation Needs: No Transportation Needs (09/26/2024)  Utilities: Not At Risk (09/26/2024)  Alcohol  Screen: Low Risk (06/17/2023)  Depression (PHQ2-9): Low Risk (05/05/2024)  Financial Resource Strain: Low Risk (06/17/2023)  Physical Activity: Insufficiently Active (06/17/2023)  Social Connections: Socially Isolated (09/26/2024)  Stress: No Stress Concern Present (06/17/2023)  Tobacco Use: Low Risk (09/25/2024)  Health Literacy: Adequate Health Literacy (06/17/2023)     Readmission Risk Interventions     No data to  display

## 2024-10-05 NOTE — Discharge Summary (Signed)
 Name: Kathy Owens MRN: [id number] DOB: May 17, 1956 68 y.o. PCP: Trudy Mliss Dragon, MD  Date of Admission: 09/25/2024  1:49 PM Date of Discharge: 10/05/2024 Attending Physician: Dr. MICAEL Riis Winfrey  Discharge Diagnosis: 1. Principal Problem:   Weakness of both lower extremities Active Problems:   Cerebral palsy (HCC)   Chronic pain due to cerebral palsy   Chronic diarrhea of unknown origin   Pressure injury of skin   Leg weakness, bilateral   Low back pain with bilateral sciatica    Discharge Medications: Allergies as of 10/05/2024       Reactions   Firvanq  [vancomycin ] Hives   PO vancomycin  for C.diff (not IV).  TDD 11/09/18.   Kathy Contin [morphine] Other (See Comments)   Hallucinations   Lioresal  [baclofen ] Other (See Comments)   Hallucinations    Nsaids Other (See Comments)   Hx kidney disease   Zanaflex  [tizanidine ] Other (See Comments)   Hallucinations         Medication List     PAUSE taking these medications    triamterene -hydrochlorothiazide  37.5-25 MG tablet Wait to take this until your doctor or other care provider tells you to start again. Commonly known as: MAXZIDE -25 Take 1 tablet by mouth daily.       STOP taking these medications    Xarelto  10 MG Tabs tablet Generic drug: rivaroxaban        TAKE these medications    acetaminophen  500 MG tablet Commonly known as: TYLENOL  Take 500 mg by mouth every 6 (six) hours as needed (Pain).   alendronate  70 MG tablet Commonly known as: Fosamax  Take 1 tablet (70 mg total) by mouth every 7 (seven) days. Take with a full glass of water on an empty stomach. What changed:  when to take this additional instructions   artificial tears Oint ophthalmic ointment Commonly known as: LACRILUBE Place into the left eye 3 (three) times daily as needed for dry eyes.   atorvastatin  10 MG tablet Commonly known as: LIPITOR TAKE 1 TABLET(10 MG) BY MOUTH DAILY What changed:  how much to take how  to take this when to take this additional instructions   cyanocobalamin  500 MCG tablet Commonly known as: VITAMIN B12 Take 500 mcg by mouth in the morning.   cyclobenzaprine  5 MG tablet Commonly known as: FLEXERIL  Take 1 tablet (5 mg total) by mouth 3 (three) times daily.   dantrolene  100 MG capsule Commonly known as: DANTRIUM  Take 1 capsule (100 mg total) by mouth 3 (three) times daily.   feeding supplement Liqd Take 237 mLs by mouth 2 (two) times daily between meals.   FeroSul 325 (65 Fe) MG tablet Generic drug: ferrous sulfate  Take 1 tablet (325 mg total) by mouth daily with breakfast.   Gerhardt's butt cream Crea Apply 1 Application topically 2 (two) times daily.   hydrocerin Crea Apply 1 Application topically 2 (two) times daily.   HYDROcodone -acetaminophen  5-325 MG tablet Commonly known as: NORCO/VICODIN Take 1 tablet by mouth every 6 (six) hours as needed for moderate pain (pain score 4-6) or severe pain (pain score 7-10).   loperamide  2 MG capsule Commonly known as: IMODIUM  TAKE 1 CAPSULE(2 MG) BY MOUTH DAILY FOR UP TO 360 DOSES AS NEEDED FOR DIARRHEA OR LOOSE STOOLS   traZODone  50 MG tablet Commonly known as: DESYREL  Take 1 tablet (50 mg total) by mouth at bedtime as needed for sleep. What changed: how much to take  Durable Medical Equipment  (From admission, onward)           Start     Ordered   10/01/24 0925  For home use only DME Other see comment  Once       Comments: Deitra lift  Question:  Length of Need  Answer:  12 Months   10/01/24 0924   10/01/24 0906  For home use only DME Hospital bed  Once       Question Answer Comment  Length of Need 12 Months   Bed type Semi-electric      10/01/24 9093            Disposition and follow-up:   Kathy.Merrissa K Owens was discharged from [facility] in Stable condition.  At the hospital follow up visit please address:  1.  Xarelto - Was started by PM&R at time of  discharge on 11/4. Likely due to stasis. Did not see need for this at this time but can reconsider in clinic  Paperwork- Patient was supposed to have paperwork from Dr. Trudy filled out so she could live with sister. She was not able to get it. Unsure where this paperwork is   2.  Labs / imaging needed at time of follow-up: none  3.  Pending labs/ test needing follow-up: none  Follow-up Appointments:  Contact information for follow-up providers     [facility] (DME) Follow up.   Specialty: DME Services Why: Referral sent for Hospital bed and hoyer lift- to be delivered to the home Contact information: [address] [place]  [phone]             Contact information for after-discharge care     Destination     [facility] Preferred SNF .   Service: Skilled Nursing Contact information: [address] [place]  [phone]             Home Medical Care     [facility]) .   Service: Home Health Services Why: HHPT/OT/aide arranged- they will contact you to schedule Contact information: [address] [place]   [address] [phone]                      Hospital Course by problem list:   Kathy Owens is a 68 y.o. female with PMH of spastic cerebral palsy, chronic lumbar spinal stenosis, chronic pain syndrome, recent hospitalization 08/23/2024 for fall at home after early discharge from inpatient rehabilitation, recently treated singles in V1 (finished treatment 07/30/2024), HTN, hyperlipidemia, T2DM, anemia, CKD 3/mild azotemia, chronic diarrhea who presented with leg weakness and admitted for weakness of bilateral LE's and management of spastic cerebral palsy    #Spastic cerebral palsy #Progressive weakness of bilateral lower extremities #Chronic back pain #Lumbar spinal  stenosis History of progressive weakness over several months, presenting after a transfer assisted by her sister during which she felt a pop in her back with spinal rotation/twisting, followed by increased back pain and perceived worsening lower extremity weakness. MRI demonstrated no acute or new abnormalities, with only chronic spinal stenosis noted; Neurosurgery evaluated and noted L1-L2 stenosis unchanged from imaging two months prior and did not feel that radiographic findings explained her symptoms or that surgical decompression would provide clinical benefit. PT/OT recommended SNF placement, but this was denied after peer-to-peer review, and the patients sister is agreeable to home discharge with a  hospital bed and [facility] lift ordered for assistance. - Continue multimodal pain control with cyclobenzaprine  5 mg TID, dantrolene  100 mg TID, and scheduled acetaminophen  1,000 mg TID.  Stage 2 pressure injury, right buttock: Evaluated by wound care; continued pressure offloading   Hyperlipidemia: Stable; continued atorvastatin  10 mg daily.    Discharge Subjective:  Patient states her sister is excited to have her home today. Patient states that she is ready to go home and be home for christmas. No other complaints at this time.    Discharge Exam:   BP (!) 144/71 (BP Location: Left Arm)   Pulse 78   Temp 97.7 F (36.5 C) (Oral)   Resp 18   Ht 5' (1.524 m)   Wt 71.9 kg   SpO2 97%   BMI 30.96 kg/m  Discharge exam:  Const: Awake, alert in NAD HENT: Normocephalic, atraumatic, mucus membranes moist Card: RRR, No MRG Resp: LCTAB, no increased work of breathing Abd: Soft, NTND, Bsx4 Extremities: spasticity noted in bilateral lower extremities but unchanged for the last few days   Pertinent Labs, Studies, and Procedures:     Latest Ref Rng & Units 10/02/2024    6:00 AM 09/25/2024    2:56 PM 08/23/2024    9:58 PM  CBC  WBC 4.0 - 10.5 K/uL 7.1  7.3  8.4   Hemoglobin 12.0 - 15.0 g/dL  88.3  86.2  86.8   Hematocrit 36.0 - 46.0 % 35.1  42.1  40.0   Platelets 150 - 400 K/uL 198  280  227        Latest Ref Rng & Units 10/03/2024    1:36 AM 10/02/2024    6:00 AM 09/25/2024    2:56 PM  CMP  Glucose 70 - 99 mg/dL 890  899  95   BUN 8 - 23 mg/dL 24  24  11    Creatinine 0.44 - 1.00 mg/dL 9.34  9.37  9.33   Sodium 135 - 145 mmol/L 136  134  141   Potassium 3.5 - 5.1 mmol/L 4.1  3.3  3.8   Chloride 98 - 111 mmol/L 107  103  106   CO2 22 - 32 mmol/L 20  21  25    Calcium  8.9 - 10.3 mg/dL 8.2  7.9  8.7   Total Protein 6.5 - 8.1 g/dL   6.6   Total Bilirubin 0.0 - 1.2 mg/dL   1.0   Alkaline Phos 38 - 126 U/L   79   AST 15 - 41 U/L   41   ALT 0 - 44 U/L   53     MR LUMBAR SPINE WO CONTRAST Addendum Date: 09/25/2024  ADDENDUM #1 ADDENDUM: New first impression should state: No significant interval change ---------------------------------------------------- Electronically signed by: Lonni Necessary MD 09/25/2024 06:15 PM EST RP Workstation: [id number]   Result Date: 09/25/2024 ORIGINAL REPORT  EXAM: MRI LUMBAR SPINE 09/25/2024 05:51:31 PM TECHNIQUE: Multiplanar multisequence MRI of the lumbar spine was performed without the administration of intravenous contrast. COMPARISON: MRI of lumbar spine 07/28/2024. CLINICAL HISTORY: Laying in bed today, patient felt a pop in her back when an aide attempted to pick her up. Patient now reports inability to raise either leg off the bed and worsened back pain. FINDINGS: BONES AND ALIGNMENT: Normal alignment. Normal vertebral body heights. Heterogeneous fatty infiltration in the marrow is again noted. Hemangioma at L1 is stable. SPINAL CORD: The conus medullaris terminates at L1. SOFT TISSUES: No paraspinal mass. L1-L2: Broad based disc protrusion  and moderate facet hypertrophy at L1-L2 crowds the nerve roots with moderate to severe central canal stenosis, similar to the prior exam. L2-L3: Broad based disc bulge is present at L2-L3 without  significant stenosis or change. L3-L4: Rightward disc protrusion is present at L3-L4. Mild subarticular narrowing is worse on the left. Moderate right and mild left foraminal stenosis is present. L4-L5: New broad based disc protrusion at L4-L5 is stable. Advanced asymmetric left sided facet hypertrophy is stable. Mild left greater than right subarticular and foraminal stenosis is present and stable. L5-S1: No significant disc herniation. No spinal canal stenosis or neural foraminal narrowing. IMPRESSION: 1. There is significant interval change. 2. Moderate to severe central canal stenosis at L1-L2 due to broad-based disc protrusion and moderate facet hypertrophy, unchanged. 3. Rightward disc protrusion at L3-L4 with mild subarticular narrowing greater on the left and moderate right/mild left foraminal stenosis. 4. Broad-based disc protrusion at L4-L5 with advanced asymmetric left-sided facet hypertrophy and mild left greater than right subarticular and foraminal stenosis, unchanged. Electronically signed by: Lonni Necessary MD 09/25/2024 06:04 PM EST RP Workstation: [id number]   DG Chest Portable 1 View Result Date: 09/25/2024 CLINICAL DATA:  Fatigue, back pain, failure to thrive. EXAM: PORTABLE CHEST 1 VIEW COMPARISON:  07/23/2024. FINDINGS: The heart size and mediastinal contours are within normal limits. Minimal subsegmental atelectasis or scarring is noted at the left lung base. No consolidation, effusion, or pneumothorax is seen. No acute osseous abnormality. IMPRESSION: No active disease. Electronically Signed   By: Leita Birmingham M.D.   On: 09/25/2024 17:27     Discharge Instructions:   Signed: D'Mello, Kristiane Morsch, DO 10/05/2024, 9:54 AM

## 2024-10-05 NOTE — Plan of Care (Signed)

## 2024-10-05 NOTE — Progress Notes (Signed)
 Occupational Therapy Treatment Patient Details Name: Kathy Owens MRN: [id number] DOB: 1956/03/03 Today's Date: 10/05/2024   History of present illness PT is a 68 yo female admitted 12/7 following a pop in her back while sister was bathing her in bed and pt has LE weakness (chronic). Pt was in [facility] hospital s/p fall in Oct and was on acute rehab from 10/16-11/4. Pt went home after declining further rehab and readmitted same day as d/c after a fall.  Pt went to [facility] after that fall and was just d/c'd home 12/5 and now readmitted. MRI L1-2 canal stenosis and L3-4, L4-5 disc protrusion. PMH: spastic cerebral palsy, chronic pain syndrome, shingles, limbar spinal stenosis, CKD, T2DM, HTN, chronic diarrhea, anterior ischemic optic neuropathy.   OT comments  Patient received in supine and agreeable to OT treatment. Patient stated she had a BM and required assistance to clean. Patient performed rolling in bed with mod assist and total assist for cleaning. Patient was instructed on bed mobility and max assist to get to EOB. Patient required max assist initially due to posterior leaning and progressed to min assist. Patient able to perform grooming tasks seated on EOB and tolerated 8 minutes on EOB before returning to supine with mod assist. Patient was provided setup for breakfast with assistance to open containers.  Discharge recommendations continue to be appropriate. Acute OT to continue to follow to address established goals to facilitate DC to next venue of care.        If plan is discharge home, recommend the following:  A lot of help with bathing/dressing/bathroom;Assistance with cooking/housework;Assistance with feeding;Direct supervision/assist for medications management;Direct supervision/assist for financial management;Assist for transportation;Supervision due to cognitive status   Equipment Recommendations  Hoyer lift;Other (comment) (if room for hoyer and if pt goes home)     Recommendations for Other Services      Precautions / Restrictions Precautions Precautions: Fall Recall of Precautions/Restrictions: Impaired Precaution/Restrictions Comments: poor memory/historian Restrictions Weight Bearing Restrictions Per Provider Order: No       Mobility Bed Mobility Overal bed mobility: Needs Assistance Bed Mobility: Supine to Sit, Sit to Supine, Rolling Rolling: Mod assist, Used rails   Supine to sit: Max assist, HOB elevated, Used rails Sit to supine: Used rails, Mod assist   General bed mobility comments: rolling performed to assists with cleaning and assistance with BLE and trunk to get to EOB    Transfers                         Balance Overall balance assessment: Needs assistance Sitting-balance support: Feet supported, Single extremity supported, Bilateral upper extremity supported Sitting balance-Leahy Scale: Poor Sitting balance - Comments: max assist initially once on EOB and progressed to min assist Postural control: Posterior lean                                 ADL either performed or assessed with clinical judgement   ADL Overall ADL's : Needs assistance/impaired Eating/Feeding: Set up;Sitting;Bed level Eating/Feeding Details (indicate cue type and reason): seated up in bed patient required assistance with setup Grooming: Wash/dry hands;Wash/dry face;Minimal assistance;Sitting Grooming Details (indicate cue type and reason): min assist for balance     Lower Body Bathing: Total assistance;Bed level Lower Body Bathing Details (indicate cue type and reason): assisted patient with cleaning after BM in bed  Extremity/Trunk Assessment              Occupational Psychologist Communication: Impaired Factors Affecting Communication: Hearing impaired   Cognition Arousal: Alert Behavior During Therapy: WFL for tasks  assessed/performed Cognition: History of cognitive impairments, No family/caregiver present to determine baseline, Cognition impaired     Awareness: Intellectual awareness impaired, Online awareness impaired Memory impairment (select all impairments): Short-term memory, Non-declarative long-term memory, Declarative long-term memory, Working memory Attention impairment (select first level of impairment): Sustained attention Executive functioning impairment (select all impairments): Problem solving, Reasoning                   Following commands: Intact        Cueing   Cueing Techniques: Verbal cues  Exercises      Shoulder Instructions       General Comments      Pertinent Vitals/ Pain       Pain Assessment Pain Assessment: No/denies pain  Home Living                                          Prior Functioning/Environment              Frequency  Min 1X/week        Progress Toward Goals  OT Goals(current goals can now be found in the care plan section)  Progress towards OT goals: Progressing toward goals  Acute Rehab OT Goals Patient Stated Goal: to go home OT Goal Formulation: With patient Time For Goal Achievement: 10/10/24 Potential to Achieve Goals: Fair ADL Goals Pt Will Perform Eating: with set-up;sitting Pt Will Perform Grooming: with set-up;sitting Pt Will Perform Upper Body Bathing: with set-up;sitting Pt Will Perform Upper Body Dressing: with set-up;sitting Additional ADL Goal #1: Pt will sit on EOB for 5 minutes during grooming task with supervision for balance. Additional ADL Goal #2: If pt is d/cding home, sister will come in to show independence with use of hoyer lift  Plan      Co-evaluation                 AM-PAC OT 6 Clicks Daily Activity     Outcome Measure   Help from another person eating meals?: A Little Help from another person taking care of personal grooming?: A Little Help from another  person toileting, which includes using toliet, bedpan, or urinal?: Total Help from another person bathing (including washing, rinsing, drying)?: A Lot Help from another person to put on and taking off regular upper body clothing?: A Lot Help from another person to put on and taking off regular lower body clothing?: Total 6 Click Score: 12    End of Session    OT Visit Diagnosis: Other abnormalities of gait and mobility (R26.89);History of falling (Z91.81);Adult, failure to thrive (R62.7);Other symptoms and signs involving cognitive function;Other symptoms and signs involving the nervous system (R29.898)   Activity Tolerance Patient tolerated treatment well   Patient Left in bed;with call bell/phone within reach;with bed alarm set   Nurse Communication Mobility status;Need for lift equipment        Time: 9261-9195 OT Time Calculation (min): 26 min  Charges: OT General Charges $OT Visit: 1 Visit OT Treatments $Self Care/Home Management : 8-22 mins $Therapeutic Activity: 8-22 mins  Dick Laine, OTA  Acute Rehabilitation Services  Office [phone]   Jeb LITTIE Laine 10/05/2024, 8:42 AM

## 2024-10-06 ENCOUNTER — Telehealth: Payer: Self-pay | Admitting: *Deleted

## 2024-10-06 ENCOUNTER — Other Ambulatory Visit: Payer: Self-pay | Admitting: *Deleted

## 2024-10-06 NOTE — Telephone Encounter (Signed)
 Copied from CRM #[id number]. Topic: Appointments - Appointment Cancel/Reschedule >> Oct 06, 2024 10:16 AM Diannia H wrote: Patient/patient representative is calling to cancel or reschedule an appointment. Patient stated that she would like to have a video visit because its no way she is able to get to the clinic at all. Could you assist? I know HFU has to be in person. Callback number is [phone].

## 2024-10-06 NOTE — Telephone Encounter (Addendum)
 Call to [facility] patient has refills left on the Fosamax .  Pharmacy to fill.  Will be ready for pick up on Saturday.   Call to patient message was left that a refill on the Fosamax  will be ready on Saturday.

## 2024-10-06 NOTE — Telephone Encounter (Signed)
 RTC to April, RN from [employer].  Message was ;eft that the Clinics had returned her call.  rdan, Kathy Owens (Patient)   Subject  Harps, Kathy Owens (Patient)   Topic  Clinical - Home Health Verbal Orders    Communication  Caller/Agency: April, RN with [facility]    Callback Number: [phone]    Service Requested: Skilled Nursing & others    Frequency: skilled nursing weekly for 4 weeks, home health aide weekly for 4 weeks, & social worker evaluation (all related to recent hospitalization)  Any new concerns about the patient? No

## 2024-10-06 NOTE — Telephone Encounter (Signed)
 Dr Kem who discharged pt - stated it's ok to change appt to virtual. I talked to pt who stated her sister has a smart phone and to call her phone @ [phone]. Pt's appt will be with Dr Elicia.

## 2024-10-06 NOTE — Telephone Encounter (Signed)
 RTC from April, RN [facility] needs Verbal order for Skilled Nursing for patient 1 time a week for 4 weeks, HH Aide 1 time a week for 4 weeks and a Child Psychotherapist referral.  Verbal approval given for Skilled Nursing, HH Aide and the SW.  Skilled Nursing to work on Med Management and pressure prevention.  Patient does not take her Vitamin b 12, Trazadone or the Loperamide  for diarrhea.  Patient is requesting a refill on her Fosamax  as well.  SW to work on Engineer, Building Services for patient as her insurance will not cover Rehab for the patient.

## 2024-10-07 ENCOUNTER — Inpatient Hospital Stay: Payer: Self-pay | Admitting: Student

## 2024-10-10 ENCOUNTER — Telehealth: Payer: Self-pay | Admitting: *Deleted

## 2024-10-10 NOTE — Telephone Encounter (Addendum)
 Return call to [facility] PT with [facility] who stated pt needs a lot of help; needs a hoyer and 2 person transfer assist. Stated he's trying to teach pt's sister but she's very weak. Requesting verbal order for  PT once a week x 4; also wants to add OT VO given - sending to PCP for approval or denial.  I also called Al Abrahms , [phone], he goes to the same church as Kathy Owens. He asked about the [facility] orders; wanted to know if it had been ordered. I told him I could not give out information. He stated the church is willing to help Kathy Owens with some of the expense.SABRA Previous encounter, Kenneth had given verbal orders to [facility] aide,SW, skilled nursing on 12/18 - I called April, I had to leave a message.

## 2024-10-10 NOTE — Telephone Encounter (Signed)
 Copied from CRM #[id number]. Topic: Clinical - Home Health Verbal Orders >> Oct 10, 2024  1:37 PM Carrielelia G wrote: Caller/Agency: Sree with  [facility] Number: [phone] Service Requested: Physical Therapy Frequency: 1w4 Any new concerns about the patient? Yes Sree would like to add Occupational Therapy (Evaluation needed)

## 2024-10-10 NOTE — Telephone Encounter (Signed)
 Copied from CRM #[id number]. Topic: General - Other >> Oct 07, 2024  2:06 PM Cherylann RAMAN wrote: Reason for CRM: Called regarding home health order. Informed to give [facility] a call to follow up on order. >> Oct 10, 2024  9:40 AM Graeme ORN wrote: Al Abrahms called from [employer]. States He needs to speak with clinician about Home Health Orders. He has been working with patient following hospital discharge to try to assist her. Not showing DPR. States if someone contacts him he can get the patient on the line as well. States church is trying to see where the home health process is. Patient really needs assistance. The church can provide funds if not ordered yet. Thank You Phone   [phone]

## 2024-10-10 NOTE — Telephone Encounter (Signed)
 Return call to [facility] PT ; no answer; left message of office's return call.

## 2024-10-11 ENCOUNTER — Encounter: Payer: Self-pay | Admitting: Internal Medicine

## 2024-10-11 ENCOUNTER — Telehealth: Payer: Self-pay | Admitting: *Deleted

## 2024-10-11 NOTE — Telephone Encounter (Signed)
 Copied from CRM [id number]. Topic: Clinical - Home Health Verbal Orders >> Oct 11, 2024  1:10 PM Carrielelia G wrote: Caller/Agency: Debbie with [facility] Number: [phone]  Service Requested:  Personal care Services   Any new concerns about the patient? Yes  Debbie did a social work evaluation today and is recommending she have Personal Care Assistance   Please call to discuss further.

## 2024-10-11 NOTE — Telephone Encounter (Signed)
 Return call to Maureen Amy - informed her Ivin is working on the form along with Dr Trudy. [name] stated it will be faxed today.

## 2024-10-11 NOTE — Telephone Encounter (Addendum)
 Received a call from Boby Amy, SW/CM where pt lives. Stated she has been trying to help pt ; stated pt was sent home from the hospital w/o any assistance at home. Stated pt could not go to Rehab after being discharged from the hospital d/t her insurance. She stated pt needs help bathing,dressing,etc.; needs Personal Care Services as soon as possible.  Fax # given to expedite services - [phone]. Also asking for referral for [facility] Aide. I talked to [name] who stated for PCS, pt needs an appt (in person or virtual) within 90 days. Last OV was 06/16/24. Verbal orders have been given for PT/OT - for once a week visits. Ms Amy stated pt needs help with ADL's. She's requesting a call back today for a f/u.

## 2024-10-11 NOTE — Telephone Encounter (Signed)
 Thanks [name].

## 2024-10-11 NOTE — Telephone Encounter (Signed)
 Copied from CRM #[id number]. Topic: General - Other >> Oct 11, 2024  3:22 PM Diannia H wrote: Reason for CRM: [employer] is calling for a status of the PCS forms. I informed her that the forms were complete and she had some additional questions for Ms Gaetana. Could you assist? Callback number is [phone]. You can leave a message as well.

## 2024-10-11 NOTE — Telephone Encounter (Signed)
 Return call to Debbie,Medical SW with [facility].  Requesting PCS - informed her I had talked to Boby Amy, stated she knows her, about PCS. And we will fax form today. She was not aware Vickie had called our office. Stated she had seen Carine today also their RN made a visit as well. Marval stated she will help pt to get CAP services again; she will fax the form to our office by next week. She also stated she will try to get a [facility] Aide ;they only have 1 at this time - earliest will be 10/26/2024; she's hoping sooner if 1 is available.

## 2024-10-12 ENCOUNTER — Telehealth: Payer: Self-pay | Admitting: *Deleted

## 2024-10-12 NOTE — Telephone Encounter (Signed)
 PCS forms faxed for this patient to [phone] / office will contact patient to make appointment for in home assessment.

## 2024-10-18 ENCOUNTER — Encounter: Payer: Self-pay | Admitting: Student

## 2024-10-18 ENCOUNTER — Telehealth: Payer: Self-pay | Admitting: *Deleted

## 2024-10-18 ENCOUNTER — Ambulatory Visit: Payer: Self-pay | Admitting: Student

## 2024-10-18 ENCOUNTER — Telehealth: Payer: Self-pay | Admitting: Student

## 2024-10-18 ENCOUNTER — Telehealth: Payer: Self-pay

## 2024-10-18 DIAGNOSIS — I10 Essential (primary) hypertension: Secondary | ICD-10-CM

## 2024-10-18 DIAGNOSIS — G800 Spastic quadriplegic cerebral palsy: Secondary | ICD-10-CM

## 2024-10-18 DIAGNOSIS — R29898 Other symptoms and signs involving the musculoskeletal system: Secondary | ICD-10-CM

## 2024-10-18 DIAGNOSIS — Z7409 Other reduced mobility: Secondary | ICD-10-CM

## 2024-10-18 DIAGNOSIS — G809 Cerebral palsy, unspecified: Secondary | ICD-10-CM | POA: Diagnosis not present

## 2024-10-18 DIAGNOSIS — G959 Disease of spinal cord, unspecified: Secondary | ICD-10-CM

## 2024-10-18 DIAGNOSIS — F1120 Opioid dependence, uncomplicated: Secondary | ICD-10-CM | POA: Insufficient documentation

## 2024-10-18 DIAGNOSIS — R32 Unspecified urinary incontinence: Secondary | ICD-10-CM | POA: Diagnosis not present

## 2024-10-18 NOTE — Telephone Encounter (Signed)
 Patient called stating that all the muscle relaxer ae making her weak and can barely stand up. Please advise.

## 2024-10-18 NOTE — Assessment & Plan Note (Addendum)
 HFU via virtual visit. Admitted 12/7 to 12/17 for progressive weakness of BLE. MRI lumbar w/o acute findings. NSY consulted and reviewed imaging, does not rec surgical interventions. PT and OT consulted and rec SNF but denied even w/ P2P. D/c to home w/ sister and has hospital bed and hoyer lift. States still has weakness of BLE. Trying to move all extremities as tolerated. Has hoyer lift to assist w/ transfer. Pain regimen: Cyclobenzaprine  5 mg 3 times daily, dantrolene  100 mg 3 times daily and Tylenol  1000 mg 3 times daily.   Plan -Continue f/u w/ PMR  -Continue working w/ HH RN, PT and OT -Pending PCS  -Has Hoyer lift, patient asked about [employer] Lift, will inquire but likely need patient to be evaluated by PT/OT

## 2024-10-18 NOTE — Telephone Encounter (Unsigned)
 Copied from CRM #[id number]. Topic: Clinical - Order For Equipment >> Oct 18, 2024  9:55 AM Mercer PEDLAR wrote: Reason for CRM: [facility] - Service Coordinator, is calling to request adult diapers size large for patient. She stated that this will be a recurring order and is requesting for refills to be sent with order.

## 2024-10-18 NOTE — Assessment & Plan Note (Addendum)
 Was started on Xarelto  during inpatient rehab for stasis. Not restarted during recent hospitalization. No signs of bleeding when taking but patient does not prefer it. Able to move some and has hospital bed and Hoyer lift at home w/ sister. HH PT/OT. Shared decision making and will NOT continue Xarelto  at this time.

## 2024-10-18 NOTE — Telephone Encounter (Signed)
 Will forward to Dr. Elicia.                     Copied from CRM [id number]. Topic: Clinical - Medication Question >> Oct 18, 2024  2:28 PM Fredrica W wrote: Reason for CRM: Patient called states she had virtual visit. Asked about if she should go back on blood pressure medicine - was taken off at hospital. States she just had home health nurse come out and took bp - calling back to tell provider what it was and get next steps. States it was 137/80. Thank You

## 2024-10-18 NOTE — Assessment & Plan Note (Addendum)
 Virtual visit today so unable to obtain BP. Triamterene -hydrochlorothiazide  was held/paused since 11/4 admission given normotensive. Patient to ask [facility] RN to measure BP and if consistently elevated then restart BP med.

## 2024-10-18 NOTE — Progress Notes (Signed)
 "  Virtual Visit via Video Note  I connected with Kathy Owens on 10/18/2024 at  1:15 PM EST by a video enabled telemedicine application and verified that I am speaking with the correct person using two identifiers.  Patient Location: Home Provider Location: Office/Clinic  I discussed the limitations, risks, security, and privacy concerns of performing an evaluation and management service by video and the availability of in person appointments. I also discussed with the patient that there may be a patient responsible charge related to this service. The patient expressed understanding and agreed to proceed.  Subjective: PCP: Trudy Mliss Dragon, MD  Chief Complaint  Patient presents with   Hospitalization Follow-up    Follow up Hospitalization  Patient was admitted to [facility] on 12/7 and discharged on 12/17. She was treated for progressive weakness of BLE. Treatment for this included PT/OT. Telephone follow up was done on N/A She reports fair compliance with treatment. She reports this condition is stayed the same.  ----------------------------------------------------------------------------------------- -  ROS: Per A&P Current Medications[1]  Observations/Objective: There were no vitals filed for this visit. Physical Exam Constitutional:      General: She is not in acute distress. Pulmonary:     Effort: Pulmonary effort is normal.  Neurological:     Mental Status: She is alert and oriented to person, place, and time.     Assessment and Plan: Assessment & Plan Weakness of both lower extremities Cerebral palsy, unspecified type (HCC) HFU via virtual visit. Admitted 12/7 to 12/17 for progressive weakness of BLE. MRI lumbar w/o acute findings. NSY consulted and reviewed imaging, does not rec surgical interventions. PT and OT consulted and rec SNF but denied even w/ P2P. D/c to home w/ sister and has hospital bed and hoyer lift. States still has weakness of BLE.  Trying to move all extremities as tolerated. Has hoyer lift to assist w/ transfer. Pain regimen: Cyclobenzaprine  5 mg 3 times daily, dantrolene  100 mg 3 times daily and Tylenol  1000 mg 3 times daily.   Plan -Continue f/u w/ PMR  -Continue working w/ HH RN, PT and OT -Pending PCS  -Has Hoyer lift, patient asked about [employer] Lift, will inquire but likely need patient to be evaluated by PT/OT    Essential hypertension Virtual visit today so unable to obtain BP. Triamterene -hydrochlorothiazide  was held/paused since 11/4 admission given normotensive. Patient to ask [facility] RN to measure BP and if consistently elevated then restart BP med.      Impaired mobility Was started on Xarelto  during inpatient rehab for stasis. Not restarted during recent hospitalization. No signs of bleeding when taking but patient does not prefer it. Able to move some and has hospital bed and Hoyer lift at home w/ sister. HH PT/OT. Shared decision making and will NOT continue Xarelto  at this time.     Urinary incontinence, unspecified type Reported urinary incontinence during hospital admission and previous admission. Request for incontinence supplies, will complete [employer] paperwork.        Follow Up Instructions: Return in about 4 weeks (around 11/15/2024) for routine check up.     The patient was advised to call back or seek an in-person evaluation if the symptoms worsen or if the condition fails to improve as anticipated.    Patient discussed with Dr. Machen  Jamyla Ard, DO     [1]  Current Outpatient Medications:    acetaminophen  (TYLENOL ) 500 MG tablet, Take 500 mg by mouth every 6 (six) hours as needed (Pain)., Disp: ,  Rfl:    alendronate  (FOSAMAX ) 70 MG tablet, Take 1 tablet (70 mg total) by mouth every 7 (seven) days. Take with a full glass of water on an empty stomach. (Patient taking differently: Take 70 mg by mouth every Monday.), Disp: 4 tablet, Rfl: 11   artificial tears (LACRILUBE) OINT  ophthalmic ointment, Place into the left eye 3 (three) times daily as needed for dry eyes. (Patient not taking: Reported on 09/25/2024), Disp: 3.5 g, Rfl: 0   atorvastatin  (LIPITOR) 10 MG tablet, TAKE 1 TABLET(10 MG) BY MOUTH DAILY (Patient taking differently: Take 10 mg by mouth in the morning.), Disp: 90 tablet, Rfl: 3   cyanocobalamin  (VITAMIN B12) 500 MCG tablet, Take 500 mcg by mouth in the morning., Disp: , Rfl:    cyclobenzaprine  (FLEXERIL ) 5 MG tablet, Take 1 tablet (5 mg total) by mouth 3 (three) times daily., Disp: 30 tablet, Rfl: 0   dantrolene  (DANTRIUM ) 100 MG capsule, Take 1 capsule (100 mg total) by mouth 3 (three) times daily., Disp: 90 capsule, Rfl: 0   feeding supplement (BOOST HIGH PROTEIN) LIQD, Take 237 mLs by mouth 2 (two) times daily between meals., Disp: , Rfl:    ferrous sulfate  325 (65 FE) MG tablet, Take 1 tablet (325 mg total) by mouth daily with breakfast. (Patient not taking: Reported on 09/25/2024), Disp: 30 tablet, Rfl: 0   hydrocerin (EUCERIN) CREA, Apply 1 Application topically 2 (two) times daily., Disp: , Rfl:    HYDROcodone -acetaminophen  (NORCO/VICODIN) 5-325 MG tablet, Take 1 tablet by mouth every 6 (six) hours as needed for moderate pain (pain score 4-6) or severe pain (pain score 7-10)., Disp: 120 tablet, Rfl: 0   loperamide  (IMODIUM ) 2 MG capsule, TAKE 1 CAPSULE(2 MG) BY MOUTH DAILY FOR UP TO 360 DOSES AS NEEDED FOR DIARRHEA OR LOOSE STOOLS, Disp: 90 capsule, Rfl: 3   Nystatin (GERHARDT'S BUTT CREAM) CREA, Apply 1 Application topically 2 (two) times daily. (Patient not taking: Reported on 09/25/2024), Disp: 60 each, Rfl: 0   traZODone  (DESYREL ) 50 MG tablet, Take 1 tablet (50 mg total) by mouth at bedtime as needed for sleep. (Patient taking differently: Take 25 mg by mouth at bedtime as needed for sleep.), Disp: 10 tablet, Rfl: 0   [Paused] triamterene -hydrochlorothiazide  (MAXZIDE -25) 37.5-25 MG tablet, Take 1 tablet by mouth daily., Disp: 90 tablet, Rfl: 3  "

## 2024-10-19 ENCOUNTER — Other Ambulatory Visit: Payer: Self-pay | Admitting: Internal Medicine

## 2024-10-19 DIAGNOSIS — G894 Chronic pain syndrome: Secondary | ICD-10-CM

## 2024-10-19 NOTE — Progress Notes (Signed)
 Internal Medicine Clinic Attending  Case discussed with the resident at the time of the visit.  We reviewed the resident's history and exam and pertinent patient test results.  I agree with the assessment, diagnosis, and plan of care documented in the resident's note.

## 2024-10-19 NOTE — Telephone Encounter (Unsigned)
 Copied from CRM #[id number]. Topic: Clinical - Medication Refill >> Oct 19, 2024  1:18 PM Chiquita SQUIBB wrote: Medication: HYDROcodone -acetaminophen  HYDROcodone -acetaminophen  (NORCO/VICODIN) 5-325 MG tablet   Has the patient contacted their pharmacy? Yes (Agent: If no, request that the patient contact the pharmacy for the refill. If patient does not wish to contact the pharmacy document the reason why and proceed with request.) (Agent: If yes, when and what did the pharmacy advise?)  This is the patient's preferred pharmacy:  [id number] - [place], [place] - [address] AT [facility] OF [name] [place] [address] Phone: [phone] Fax: [phone]   Is this the correct pharmacy for this prescription? Yes If no, delete pharmacy and type the correct one.   Has the prescription been filled recently? No  Is the patient out of the medication? Yes  Has the patient been seen for an appointment in the last year OR does the patient have an upcoming appointment? Yes  Can we respond through MyChart? No  Agent: Please be advised that Rx refills may take up to 3 business days. We ask that you follow-up with your pharmacy.

## 2024-10-21 ENCOUNTER — Telehealth: Payer: Self-pay | Admitting: *Deleted

## 2024-10-21 NOTE — Telephone Encounter (Signed)
 Will forward to Dr. Zheng and her PCP.    Copied from CRM [id number]. Topic: General - Other >> Oct 21, 2024 10:34 AM Kathy Owens wrote: Reason for CRM: Patient had a video visit with Dr. Ozell on the 30th and was advised to call in her blood pressure numbers after the visit. She only has the blood pressure readings of 10/21/2023 at the moment and stated it was 150/80

## 2024-10-24 DIAGNOSIS — G894 Chronic pain syndrome: Secondary | ICD-10-CM

## 2024-10-24 MED ORDER — HYDROCODONE-ACETAMINOPHEN 5-325 MG PO TABS: 1.0000 | ORAL_TABLET | Freq: Four times a day (QID) | ORAL | 0 refills | Status: DC | PRN

## 2024-10-24 NOTE — Telephone Encounter (Signed)
 Copied from CRM #[id number]. Topic: Clinical - Medication Refill >> Oct 19, 2024  1:18 PM Kathy Owens wrote: Medication: HYDROcodone -acetaminophen  HYDROcodone -acetaminophen  (NORCO/VICODIN) 5-325 MG tablet   Has the patient contacted their pharmacy? Yes (Agent: If no, request that the patient contact the pharmacy for the refill. If patient does not wish to contact the pharmacy document the reason why and proceed with request.) (Agent: If yes, when and what did the pharmacy advise?)  This is the patient's preferred pharmacy:  [id number] - [place], [place] - [address] AT [facility] OF [name] [place] [address] Phone: [phone] Fax: [phone]   Is this the correct pharmacy for this prescription? Yes If no, delete pharmacy and type the correct one.   Has the prescription been filled recently? No  Is the patient out of the medication? Yes  Has the patient been seen for an appointment in the last year OR does the patient have an upcoming appointment? Yes  Can we respond through MyChart? No  Agent: Please be advised that Rx refills may take up to 3 business days. We ask that you follow-up with your pharmacy. >> Oct 24, 2024 10:58 AM Kathy Owens wrote: HYDROcodone -acetaminophen  (NORCO/VICODIN) 5-325 MG tablet     PT calling on update for medication . Due to holiday it should be 3 day mark ny 10/25/24. Pt is ok to waiting. Out of medication.  [phone]

## 2024-10-24 NOTE — Telephone Encounter (Signed)
 Last rx written - 09/20/24. Last OV - 06/16/24. Next OV - 01/05/25.

## 2024-10-24 NOTE — Telephone Encounter (Signed)
 Copied from CRM #[id number]. Topic: Clinical - Medication Refill >> Oct 24, 2024 11:12 AM DeAngela L wrote: Medication: loperamide  (IMODIUM ) 2 MG capsule  Has the patient contacted their pharmacy? Yes  (Agent: If no, request that the patient contact the pharmacy for the refill. If patient does not wish to contact the pharmacy document the reason why and proceed with request.) (Agent: If yes, when and what did the pharmacy advise?)  This is the patient's preferred pharmacy:  [facility] [place]  [place], [place] - [address] [place] [address] Phone: [phone] Fax: [phone]  Is this the correct pharmacy for this prescription? Yes  If no, delete pharmacy and type the correct one.   Has the prescription been filled recently? Yes   Is the patient out of the medication? Yes   Has the patient been seen for an appointment in the last year OR does the patient have an upcoming appointment? Yes   Can we respond through MyChart? No   Agent: Please be advised that Rx refills may take up to 3 business days. We ask that you follow-up with your pharmacy.

## 2024-10-25 ENCOUNTER — Telehealth: Payer: Self-pay | Admitting: *Deleted

## 2024-10-25 DIAGNOSIS — K529 Noninfective gastroenteritis and colitis, unspecified: Secondary | ICD-10-CM

## 2024-10-25 DIAGNOSIS — M810 Age-related osteoporosis without current pathological fracture: Secondary | ICD-10-CM

## 2024-10-25 DIAGNOSIS — E785 Hyperlipidemia, unspecified: Secondary | ICD-10-CM

## 2024-10-25 MED ORDER — ALENDRONATE SODIUM 70 MG PO TABS: 70.0000 mg | ORAL_TABLET | ORAL | 3 refills | Status: AC

## 2024-10-25 MED ORDER — FERROUS SULFATE 325 (65 FE) MG PO TABS: 325.0000 mg | ORAL_TABLET | Freq: Every day | ORAL | 3 refills | Status: AC

## 2024-10-25 MED ORDER — ATORVASTATIN CALCIUM 10 MG PO TABS: 10.0000 mg | ORAL_TABLET | Freq: Every day | ORAL | 3 refills | Status: AC

## 2024-10-25 MED ORDER — GERHARDT'S BUTT CREAM: 1.0000 | TOPICAL_CREAM | Freq: Two times a day (BID) | CUTANEOUS | 0 refills | Status: AC

## 2024-10-25 MED ORDER — LOPERAMIDE HCL 2 MG PO CAPS: 2.0000 mg | ORAL_CAPSULE | Freq: Every day | ORAL | 3 refills | Status: AC | PRN

## 2024-10-25 NOTE — Telephone Encounter (Signed)
 Will forward to PCP.                            Copied from CRM [id number]. Topic: Clinical - Medication Question >> Oct 25, 2024  3:05 PM Farrel B wrote: Reason for CRM: After reporting her blood pressure, pt wanted an update on all prescriptions being sent over to her new pharmacy of choice. [facility]. She has requested an update. Please call pt to advise [phone]. I clarified was it just one medication or all she stated she wanted all medications sent to the pharmacy mentioned above.

## 2024-10-25 NOTE — Telephone Encounter (Signed)
 Spoke with patient to confirm current medication list.  Collaborated with PCP Dr. Trudy to resend maintenance medications to [facility] for mail delivery.   Sent alendronate , atorvastatin , loperamide  and ferrous sulfate  as requested by patient. She also requested triamterene -hydrochlorothiazide  but this had been held previously and she does have some supply remaining. Will follow-up to see if she had resumed this based on instructions given by Dr. Zheng on 10/21/24 .  Updated preferred pharmacy in Epic. Explained that [facility] pharmacy also offers free delivery if she would like to use this service in the future.   Lorain Baseman, PharmD [facility] [phone]

## 2024-10-25 NOTE — Telephone Encounter (Signed)
 Will forward to PCP. request to PCP.   Copied from CRM #[id number]. Topic: Clinical - Prescription Issue >> Oct 25, 2024  9:27 AM Cherylann RAMAN wrote: Reason for CRM: Patient called to ensure that all her medications has been switched over to [facility]. Please confirm with patient. Patient can be contacted at [phone]

## 2024-10-27 ENCOUNTER — Telehealth: Payer: Self-pay | Admitting: Internal Medicine

## 2024-10-27 NOTE — Telephone Encounter (Unsigned)
 Copied from CRM [id number]. Topic: Appointments - Scheduling Inquiry for Clinic >> Oct 27, 2024 10:10 AM Kathy Owens wrote: Reason for CRM: Patient is calling in asking if her 03/19 appointment can be virtual instead of in person. Please advise patient.

## 2024-10-27 NOTE — Telephone Encounter (Signed)
 Spoke to Dr Louis personally- he is willing to see Kathy Owens to see if the L1 compression of her spine could be adding to her LE weakness- pt said is willing to do surgery if no other way to walk/stand- used to walk with  RW- not great/not safely, but did walk- now is w/c bound.   Dr Louis referral placed- will need to get transported by insurance, hopefully, since she has no way to get to appointments otherwise.

## 2024-10-28 ENCOUNTER — Telehealth: Payer: Self-pay | Admitting: *Deleted

## 2024-10-28 NOTE — Telephone Encounter (Signed)
 Will forward to C. Boone .                      Copied from CRM [id number]. Topic: Clinical - Order For Equipment >> Oct 28, 2024 10:27 AM Farrel B wrote: Reason for CRM: pt has called in stating that she is almost out of adult diapers, and [employer] is needing a prescription sent to them to continue sending out the diapers for the pt please call to advise [phone]. Pt states she has about a pack and a half of diapers remaining.

## 2024-10-31 ENCOUNTER — Ambulatory Visit: Admitting: Podiatry

## 2024-11-01 ENCOUNTER — Telehealth: Payer: Self-pay | Admitting: *Deleted

## 2024-11-01 NOTE — Telephone Encounter (Signed)
 Copied from CRM [id number]. Topic: Clinical - Medication Question >> Nov 01, 2024 10:23 AM DeAngela L wrote: Reason for CRM: patient calling to ask if Dr Elicia has decided if the patient will be put back on the blood pressure medication after you appointment with him 10/18/24?  Patient num  [phone]

## 2024-11-01 NOTE — Telephone Encounter (Signed)
 I called pt who stated [facility] comes once a week; her sister stated last BP was 126/20 on Wed 10/26/24.

## 2024-11-02 ENCOUNTER — Telehealth: Payer: Self-pay | Admitting: *Deleted

## 2024-11-02 NOTE — Telephone Encounter (Signed)
 Will forward to C. Boone.                   Copied from CRM [id number]. Topic: Clinical - Order For Equipment >> Oct 28, 2024 10:27 AM Farrel B wrote: Reason for CRM: pt has called in stating that she is almost out of adult diapers, and [employer] is needing a prescription sent to them to continue sending out the diapers for the pt please call to advise [phone]. Pt states she has about a pack and a half of diapers remaining. >> Nov 02, 2024  9:06 AM Miquel SAILOR wrote: PT has no supplies for adult diapers. Needs call hack  on update ASAP [phone] >> Nov 01, 2024 10:21 AM DeAngela L wrote: Patient calling to check the status of the provider sign and sending back to the company with the adult diapers, informed patient of the the pcp has received the form  >> Oct 31, 2024  8:50 AM Antonio H wrote: Patient called to check status, says she really needs her diapers. Let pt know of latest update from today, CMN was received and awaiting doctor's signature. Patient understood.

## 2024-11-02 NOTE — Telephone Encounter (Signed)
 Sorry Dr Elicia for the error. Pt's BP - 126/70. Pt informed since BP is 126/70  ,no need to re-start BP med per Dr Elicia; and continue to monitor her BP.

## 2024-11-02 NOTE — Telephone Encounter (Signed)
 Called / informed pt (per Chilon) [employer]  will call her about her diapers and make sure she answers their call - stated she will.

## 2024-11-07 ENCOUNTER — Telehealth: Payer: Self-pay | Admitting: *Deleted

## 2024-11-07 ENCOUNTER — Telehealth: Payer: Self-pay

## 2024-11-07 NOTE — Telephone Encounter (Signed)
 Copied from CRM [id number]. Topic: Clinical - Medical Advice >> Nov 07, 2024  2:44 PM Zane F wrote: Reason for CRM:   The patient is requesting that a letter be drafted by her PCP for her sister to pick up that states that the patient has cerebral palsy and has had this condition since birth and requires additional assistance in her home. The letter needs to be signed and dated by the provider with the office letter head. Please call patient once letter is ready.  The company requesting this information is [employer] .  The patient was informed that letter like the one mentioned can take up to 7-10 business days. The patient acknowledged understanding.   Callback Number: [phone]

## 2024-11-07 NOTE — Telephone Encounter (Signed)
 Kathy Owens called and is requesting a letter stating that she has cerebral palsy for her insurance /disability written by Dr Cornelio.

## 2024-11-15 ENCOUNTER — Encounter: Payer: Self-pay | Admitting: Internal Medicine

## 2024-11-15 NOTE — Telephone Encounter (Signed)
 Just spoke with patient and she is aware that letter is ready for pickup.  Will have her sister come by this Friday 11/18/2024 before we close at 12 pm to pick it up. Letter will be located at registration desk.

## 2024-11-17 ENCOUNTER — Telehealth: Payer: Self-pay | Admitting: Internal Medicine

## 2024-11-17 ENCOUNTER — Ambulatory Visit: Admitting: Internal Medicine

## 2024-11-17 NOTE — Telephone Encounter (Signed)
 Message has been forwarded to patient's PCP.       Copied from CRM #[id number]. Topic: General - Other >> Nov 17, 2024 10:49 AM Graeme ORN wrote: Reason for CRM: [name] called from [place]. Request to speak to Dr Elsie or leave a message. 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 Callback: [phone] Thank You

## 2024-11-17 NOTE — Telephone Encounter (Signed)
 Return call to [name] with [facility]. Stated they need form 3051 completed and signed by PCP. I informed her [name], PCS coordinator, had completed a form on 10/12/2024  and faxed to [phone]. Stated she had not received a form and does not see one in her system. I talked to [name]- Form 3051 was faxed w/confirmation.

## 2024-11-17 NOTE — Telephone Encounter (Signed)
 [name]. Here's the fax# from Tanea Clark,[employer], to fax the 3051 form.

## 2024-11-17 NOTE — Telephone Encounter (Signed)
 Copied from CRM #[id number]. Topic: General - Other >> Nov 17, 2024 11:52 AM Diannia H wrote: Reason for CRM: [name] was calling back to give the correct fax number regarding the message below for Ms Gaetana RN the correct fax number is [phone]  Return call to [name] with [facility]. Stated they need form 3051 completed and signed by PCP. I informed her [name], PCS coordinator, had completed a form on 10/12/2024  and faxed to [phone]. Stated she had not received a form and does not see one in her system. I talked to [name]- Form 3051 was faxed w/confirmation.

## 2024-11-22 ENCOUNTER — Other Ambulatory Visit: Payer: Self-pay

## 2024-11-23 ENCOUNTER — Other Ambulatory Visit: Payer: Self-pay | Admitting: *Deleted

## 2024-11-23 ENCOUNTER — Other Ambulatory Visit: Payer: Self-pay | Admitting: Internal Medicine

## 2024-11-23 ENCOUNTER — Other Ambulatory Visit (HOSPITAL_COMMUNITY): Payer: Self-pay

## 2024-11-23 DIAGNOSIS — G894 Chronic pain syndrome: Secondary | ICD-10-CM

## 2024-11-23 MED ORDER — HYDROCODONE-ACETAMINOPHEN 5-325 MG PO TABS: 1.0000 | ORAL_TABLET | Freq: Four times a day (QID) | ORAL | 0 refills | Status: AC | PRN

## 2024-11-23 MED ORDER — HYDROCODONE-ACETAMINOPHEN 5-325 MG PO TABS
1.0000 | ORAL_TABLET | Freq: Four times a day (QID) | ORAL | 0 refills | Status: DC | PRN
  Filled 2024-11-23: qty 120, 30d supply, fill #0

## 2024-11-23 NOTE — Telephone Encounter (Signed)
 Copied from CRM [id number]. Topic: Clinical - Prescription Issue >> Nov 23, 2024  2:01 PM Kathy Owens ORN wrote: Reason for CRM: patient calling on status of the HYDROcodone -acetaminophen  (NORCO/VICODIN) 5-325 MG tablet Request for status of the refill medication Call for refill on  11/16/24  [id number] - [place], [place] - [address] AT [facility] OF [address]  Telephone [phone]  Fax [phone]

## 2025-01-05 ENCOUNTER — Ambulatory Visit: Admitting: Internal Medicine
